# Patient Record
Sex: Male | Born: 1945
Health system: Southern US, Community
[De-identification: ages and names within clinical notes are randomized; demographics above are authoritative.]

## PROBLEM LIST (undated history)

## (undated) DIAGNOSIS — N400 Enlarged prostate without lower urinary tract symptoms: Secondary | ICD-10-CM

## (undated) DIAGNOSIS — K8010 Calculus of gallbladder with chronic cholecystitis without obstruction: Secondary | ICD-10-CM

## (undated) DIAGNOSIS — R569 Unspecified convulsions: Secondary | ICD-10-CM

## (undated) DIAGNOSIS — Q282 Arteriovenous malformation of cerebral vessels: Secondary | ICD-10-CM

## (undated) DIAGNOSIS — N419 Inflammatory disease of prostate, unspecified: Secondary | ICD-10-CM

## (undated) DIAGNOSIS — M1990 Unspecified osteoarthritis, unspecified site: Secondary | ICD-10-CM

## (undated) DIAGNOSIS — N481 Balanitis: Secondary | ICD-10-CM

## (undated) DIAGNOSIS — E785 Hyperlipidemia, unspecified: Secondary | ICD-10-CM

## (undated) DIAGNOSIS — G4731 Primary central sleep apnea: Secondary | ICD-10-CM

## (undated) DIAGNOSIS — G4730 Sleep apnea, unspecified: Secondary | ICD-10-CM

## (undated) DIAGNOSIS — K219 Gastro-esophageal reflux disease without esophagitis: Secondary | ICD-10-CM

## (undated) DIAGNOSIS — Z9289 Personal history of other medical treatment: Secondary | ICD-10-CM

## (undated) DIAGNOSIS — R001 Bradycardia, unspecified: Secondary | ICD-10-CM

## (undated) DIAGNOSIS — T7840XA Allergy, unspecified, initial encounter: Secondary | ICD-10-CM

## (undated) DIAGNOSIS — J45909 Unspecified asthma, uncomplicated: Secondary | ICD-10-CM

## (undated) HISTORY — DX: Unspecified convulsions: R56.9

## (undated) HISTORY — DX: Inflammatory disease of prostate, unspecified: N41.9

## (undated) HISTORY — DX: Personal history of other medical treatment: Z92.89

## (undated) HISTORY — DX: Primary central sleep apnea: G47.31

## (undated) HISTORY — DX: Unspecified osteoarthritis, unspecified site: M19.90

## (undated) HISTORY — DX: Benign prostatic hyperplasia without lower urinary tract symptoms: N40.0

## (undated) HISTORY — DX: Balanitis: N48.1

## (undated) HISTORY — DX: Bradycardia, unspecified: R00.1

## (undated) HISTORY — PX: FRACTURE SURGERY: SHX138

## (undated) HISTORY — DX: Calculus of gallbladder with chronic cholecystitis without obstruction: K80.10

## (undated) HISTORY — DX: Unspecified asthma, uncomplicated: J45.909

## (undated) HISTORY — PX: TONSILLECTOMY: SUR1361

## (undated) HISTORY — DX: Arteriovenous malformation of cerebral vessels: Q28.2

## (undated) HISTORY — DX: Allergy, unspecified, initial encounter: T78.40XA

## (undated) HISTORY — DX: Hyperlipidemia, unspecified: E78.5

## (undated) HISTORY — DX: Gastro-esophageal reflux disease without esophagitis: K21.9

## (undated) HISTORY — DX: Sleep apnea, unspecified: G47.30

---

## 2000-11-11 HISTORY — PX: ELBOW SURGERY: SHX618

## 2009-02-27 ENCOUNTER — Emergency Department (HOSPITAL_COMMUNITY): Admission: EM | Admit: 2009-02-27 | Discharge: 2009-02-27 | Payer: Self-pay | Admitting: Family Medicine

## 2012-03-29 ENCOUNTER — Other Ambulatory Visit: Payer: Self-pay | Admitting: Physician Assistant

## 2012-03-29 DIAGNOSIS — R109 Unspecified abdominal pain: Secondary | ICD-10-CM

## 2012-03-30 ENCOUNTER — Ambulatory Visit
Admission: RE | Admit: 2012-03-30 | Discharge: 2012-03-30 | Disposition: A | Discharge status: Home / Self Care | Payer: BC Managed Care – PPO | Source: Ambulatory Visit | Attending: Physician Assistant | Admitting: Physician Assistant

## 2012-03-30 DIAGNOSIS — R109 Unspecified abdominal pain: Secondary | ICD-10-CM

## 2012-04-06 ENCOUNTER — Encounter (INDEPENDENT_AMBULATORY_CARE_PROVIDER_SITE_OTHER): Payer: Self-pay | Admitting: General Surgery

## 2012-04-18 ENCOUNTER — Ambulatory Visit (INDEPENDENT_AMBULATORY_CARE_PROVIDER_SITE_OTHER): Payer: BC Managed Care – PPO | Admitting: General Surgery

## 2012-04-18 ENCOUNTER — Encounter (INDEPENDENT_AMBULATORY_CARE_PROVIDER_SITE_OTHER): Payer: Self-pay | Admitting: General Surgery

## 2012-04-18 VITALS — BP 110/70 | HR 60 | Temp 97.7°F | Resp 12 | Ht 69.5 in | Wt 156.0 lb

## 2012-04-18 DIAGNOSIS — K8020 Calculus of gallbladder without cholecystitis without obstruction: Secondary | ICD-10-CM

## 2012-04-18 NOTE — Progress Notes (Signed)
Subjective:     Patient ID: Anthony Juarez, male   DOB: 04-Oct-1945, 66 y.o.   MRN: [id number]  HPI This patient is a 66 year old male with some minimal right upper quadrant pain. He states this started in 2007 with some minimal right upper quadrant pain as well as nausea. Patient was noted with a HIDA scan ultrasound EGD which revealed no GERD abnormal HIDA scan at that time. Patient at this time has complained more of "brain fog" in increased enuresis.  The the patient states he has no symptomatology to greasy foods for spicy foods at this time.  Review of Systems  Constitutional: Negative.   HENT: Negative.   Eyes: Negative.   Respiratory: Negative.   Cardiovascular: Negative.   Gastrointestinal: Positive for nausea.  Genitourinary: Positive for enuresis.  Neurological: Negative.        Objective:   Physical Exam  Constitutional: He is oriented to person, place, and time. He appears well-developed and well-nourished.  HENT:  Head: Normocephalic and atraumatic.  Eyes: Conjunctivae normal and EOM are normal. Pupils are equal, round, and reactive to light.  Neck: Neck supple.  Cardiovascular: Normal rate.   Pulmonary/Chest: Effort normal and breath sounds normal.  Abdominal: Soft. Bowel sounds are normal. He exhibits no distension and no mass. There is no tenderness. There is no rebound and no guarding.  Musculoskeletal: Normal range of motion.  Neurological: He is alert and oriented to person, place, and time.       Assessment:     Patient is 66 year old male with noted gallstones on ultrasound. The patient however would like to hold off on possible surgery to remove his gallbladder and gallstones secondary to his urinary complaints therapy workup at this time.  Be available should the patient's symptoms increase her more frequent for excision of his gallbladder.  The patient requested that time should his symptoms the remaining unclear    Plan:     1. F/u PRN

## 2012-05-06 ENCOUNTER — Encounter (INDEPENDENT_AMBULATORY_CARE_PROVIDER_SITE_OTHER): Payer: Self-pay

## 2012-06-06 ENCOUNTER — Institutional Professional Consult (permissible substitution): Payer: BC Managed Care – PPO | Admitting: Pulmonary Disease

## 2012-06-06 DIAGNOSIS — R1013 Epigastric pain: Secondary | ICD-10-CM | POA: Insufficient documentation

## 2012-06-06 DIAGNOSIS — R110 Nausea: Secondary | ICD-10-CM | POA: Insufficient documentation

## 2012-06-06 DIAGNOSIS — K8020 Calculus of gallbladder without cholecystitis without obstruction: Secondary | ICD-10-CM | POA: Insufficient documentation

## 2012-06-08 ENCOUNTER — Encounter: Payer: Self-pay | Admitting: Pulmonary Disease

## 2012-06-08 ENCOUNTER — Encounter: Payer: Self-pay | Admitting: *Deleted

## 2012-06-10 ENCOUNTER — Encounter: Payer: Self-pay | Admitting: Pulmonary Disease

## 2012-06-10 ENCOUNTER — Ambulatory Visit (INDEPENDENT_AMBULATORY_CARE_PROVIDER_SITE_OTHER): Payer: BC Managed Care – PPO | Admitting: Pulmonary Disease

## 2012-06-10 VITALS — BP 130/70 | HR 60 | Temp 98.1°F | Ht 69.0 in | Wt 163.6 lb

## 2012-06-10 DIAGNOSIS — G478 Other sleep disorders: Secondary | ICD-10-CM

## 2012-06-10 DIAGNOSIS — G4731 Primary central sleep apnea: Secondary | ICD-10-CM | POA: Insufficient documentation

## 2012-06-10 NOTE — Patient Instructions (Addendum)
Will schedule for a sleep study, and will arrange followup once the results are available.  

## 2012-06-10 NOTE — Assessment & Plan Note (Signed)
The patient is having issues with awakenings at night, as well as polyuria, and is not rested in the mornings upon arising.  He is not describing true sleepiness, but does note chronic "fogginess".  It is unclear if he has a sleep disorder to explain his symptoms, but he was diagnosed with obstructive sleep apnea in 1994 and could not tolerate a positive pressure device at that time.  I think he needs a sleep study for evaluation, and this can pick up central sleep apnea and other neurologic issues as well.  I will see him back once his sleep study is available for review.

## 2012-06-10 NOTE — Progress Notes (Signed)
Subjective:    Patient ID: Anthony Juarez, male    DOB: May 27, 1946, 66 y.o.   MRN: [id number]  HPI The patient is a 66 year old male who I have been asked to see for possible sleep apnea.  He was diagnosed in 62 while living in [place] with sleep apnea, but was unable to tolerate CPAP or BiPAP because of mask issues.  He is having issues with nocturnal polyuria, and questions been raised whether it may be related to a sleep disorder.  He is seeing a neurologist who is trying him on desmopressin.  The patient has been noted to have loud snoring, as well as an abnormal breathing pattern during sleep.  He awakens at least 2-3 times a night, and is typically not rested in the mornings upon arising.  He denies any true sleepiness during the day, but complains of chronic "fogginess".  He has no issues in the evenings watching television or movies except on occasions.  He has no sleepiness with driving.  He does have a history of nocturnal seizures in the past, but this has not been an issue for him recently.  The patient states his weight is down 14 pounds over the last 1-2 years, and his Epworth score today is 6.  Sleep Questionnaire: What time do you typically go to bed?( Between what hours) 11-11:30 pm How long does it take you to fall asleep? 5 minutes How many times during the night do you wake up? 3 What time do you get out of bed to start your day? 0730 Do you drive or operate heavy machinery in your occupation? No How much has your weight changed (up or down) over the past two years? (In pounds) 4 lb (1.814 kg) Have you ever had a sleep study before? Yes If yes, location of study? [place] If yes, date of study? 1994 Do you currently use CPAP? No Do you wear oxygen at any time? No    Review of Systems  Constitutional: Positive for unexpected weight change. Negative for fever.  HENT: Positive for congestion and postnasal drip. Negative for ear pain, nosebleeds, sore throat, rhinorrhea, sneezing,  trouble swallowing, dental problem and sinus pressure.   Eyes: Negative for redness and itching.  Respiratory: Positive for shortness of breath and wheezing. Negative for cough and chest tightness.   Cardiovascular: Negative for palpitations and leg swelling.  Gastrointestinal: Positive for nausea. Negative for vomiting.  Genitourinary: Negative for dysuria.  Musculoskeletal: Negative for joint swelling.  Skin: Positive for rash.  Neurological: Negative for headaches.  Hematological: Does not bruise/bleed easily.  Psychiatric/Behavioral: Negative for dysphoric mood. The patient is nervous/anxious.        Objective:   Physical Exam Constitutional:  Thin male in nad, no acute distress  HENT:  Nares patent without discharge, but narrowed.  Oropharynx without exudate, palate and uvula are mildly elongated.   Eyes:  Perrla, eomi, no scleral icterus  Neck:  No JVD, no TMG  Cardiovascular:  Normal rate, regular rhythm, no rubs or gallops.  No murmurs        Intact distal pulses  Pulmonary :  Normal breath sounds, no stridor or respiratory distress   No rales, rhonchi, or wheezing  Abdominal:  Soft, nondistended, bowel sounds present.  No tenderness noted.   Musculoskeletal:  No lower extremity edema noted.  Lymph Nodes:  No cervical lymphadenopathy noted  Skin:  No cyanosis noted  Neurologic:  Alert, appropriate, moves all 4 extremities without obvious deficit.  Assessment & Plan:

## 2012-06-17 ENCOUNTER — Ambulatory Visit (HOSPITAL_BASED_OUTPATIENT_CLINIC_OR_DEPARTMENT_OTHER): Payer: BC Managed Care – PPO | Attending: Pulmonary Disease | Admitting: Radiology

## 2012-06-17 VITALS — Ht 69.0 in | Wt 160.0 lb

## 2012-06-17 DIAGNOSIS — G4730 Sleep apnea, unspecified: Secondary | ICD-10-CM

## 2012-06-17 DIAGNOSIS — G478 Other sleep disorders: Secondary | ICD-10-CM

## 2012-06-17 DIAGNOSIS — G4731 Primary central sleep apnea: Secondary | ICD-10-CM | POA: Insufficient documentation

## 2012-06-28 ENCOUNTER — Telehealth: Payer: Self-pay | Admitting: Pulmonary Disease

## 2012-06-28 NOTE — Telephone Encounter (Signed)
Pt informed that results are not back yet and we will call when results are back. Dr Shelle Iron, has this study been read yet?

## 2012-06-28 NOTE — Telephone Encounter (Signed)
Probably not.  I am reading this week.

## 2012-06-28 NOTE — Telephone Encounter (Signed)
Pt is aware we will call to schedule him for follow-up once study has been read. Pt verbalized understanding.

## 2012-06-29 ENCOUNTER — Telehealth: Payer: Self-pay | Admitting: Pulmonary Disease

## 2012-06-29 DIAGNOSIS — G4710 Hypersomnia, unspecified: Secondary | ICD-10-CM

## 2012-06-29 DIAGNOSIS — G4730 Sleep apnea, unspecified: Secondary | ICD-10-CM

## 2012-06-29 NOTE — Procedures (Signed)
NAMEOKLEY, MAGNUSSEN            ACCOUNT NO.:  [id number]  MEDICAL RECORD NO.:  [id number]          PATIENT TYPE:  OUT  LOCATION:  SLEEP CENTER                 FACILITY:  [facility]  PHYSICIAN:  Barbaraann Share, MD,FCCPDATE OF BIRTH:  Jun 14, 1946  DATE OF STUDY:  06/17/2012                           NOCTURNAL POLYSOMNOGRAM  REFERRING PHYSICIAN:  Barbaraann Share, MD,FCCP  LOCATION:  Sleep Lab.  REFERRING PHYSICIAN:  Barbaraann Share, MD,FCCP  INDICATION FOR STUDY:  Hypersomnia with sleep apnea.  EPWORTH SLEEPINESS SCORE:  6.  SLEEP ARCHITECTURE:  The patient had total sleep time of 311 minutes with no slow-wave sleep and only 49 minutes of REM.  Sleep onset latency was mildly prolonged at 34 minutes, and REM onset was normal at 58 minutes.  Sleep efficiency was moderately reduced at 78%.  RESPIRATORY DATA:  The patient was found to have 9 obstructive apneas, 73 central apneas, and 1 obstructive hypopnea, giving the patient an apnea/hypopnea index of 16 events per hour.  The events were not positional, and there was mild to moderate snoring noted throughout.  OXYGEN DATA:  There was O2 desaturation as low as 91% with the patient's obstructive events.  CARDIAC DATA:  Occasional PAC and PVC noted, but no clinically significant arrhythmias were seen.  MOVEMENT/PARASOMNIA:  The patient had no significant leg jerks or other abnormal behaviors.  IMPRESSION/RECOMMENDATION: 1. Mild central sleep apnea, with an AHI of 16 events per hour and     oxygen desaturation as low as 91%.  Treatment for this degree of     sleep apnea can include nasal oxygen, standard CPAP trial, or an     Adapt ASV device.  This may not even require treatment depending     upon the patient's quality of life impact.  Clinical correlation is     suggested 2. Occasional premature atrial contraction and premature ventricular     contraction noted, but no clinically significant arrhythmias were      seen.     Barbaraann Share, MD,FCCP Diplomate, American Board of Sleep Medicine    [facility]/MEDQ  D:  06/29/2012 07:55:59  T:  06/29/2012 21:01:00  Job:  [id number]

## 2012-06-29 NOTE — Telephone Encounter (Signed)
LMTCB x 1 

## 2012-06-29 NOTE — Telephone Encounter (Signed)
Needs OV with KC to discuss sleep results.

## 2012-07-01 NOTE — Telephone Encounter (Signed)
Pt has been scheduled for Tues., 07/19/12 # 2:45 to review s;leep study results.

## 2012-07-04 ENCOUNTER — Encounter (HOSPITAL_BASED_OUTPATIENT_CLINIC_OR_DEPARTMENT_OTHER): Payer: BC Managed Care – PPO

## 2012-07-11 ENCOUNTER — Telehealth: Payer: Self-pay | Admitting: Pulmonary Disease

## 2012-07-11 NOTE — Telephone Encounter (Signed)
LMOM TCB x1.    Sleep study is scanned (study date 12.6.13); KC not in office until 1.2.14.  Left detailed message on named voice mail informing pt of this and asked him to please call back if anything is needed prior to hearing back from [place].  KC please advise.

## 2012-07-12 NOTE — Telephone Encounter (Signed)
LMTCB x 1. Pt is scheduled for OV with KC on 07/19/12 @ 2:45 to discuss sleep results. Need to clarify this with the pt and make sure he can keep this appt.

## 2012-07-14 NOTE — Telephone Encounter (Signed)
Appt rescheduled to Monday 07-26-11 at 10:15. Carron Curie, CMA

## 2012-07-19 ENCOUNTER — Ambulatory Visit: Payer: BC Managed Care – PPO | Admitting: Pulmonary Disease

## 2012-07-25 ENCOUNTER — Encounter: Payer: Self-pay | Admitting: Pulmonary Disease

## 2012-07-25 ENCOUNTER — Ambulatory Visit (INDEPENDENT_AMBULATORY_CARE_PROVIDER_SITE_OTHER): Payer: BC Managed Care – PPO | Admitting: Pulmonary Disease

## 2012-07-25 VITALS — BP 104/68 | HR 62 | Temp 97.8°F | Ht 69.5 in | Wt 162.4 lb

## 2012-07-25 DIAGNOSIS — G4730 Sleep apnea, unspecified: Secondary | ICD-10-CM

## 2012-07-25 DIAGNOSIS — G4731 Primary central sleep apnea: Secondary | ICD-10-CM

## 2012-07-25 NOTE — Assessment & Plan Note (Signed)
The patient has been found to have mild central sleep apnea, but it is unclear whether this has anything to do with his sleep disruption and daytime fogginess.  He was found to have an elevated Dilantin level, and subsequently has been changed to Keppra for his seizure disorder.  I have explained to the patient that his degree of sleep apnea it is not a medical issue for him, and the decision to treat this aggressively should be based on its impact to his quality of life.  The patient at this time would like to see how he feels since he has changed seizure medications.  He will let me know if he wishes to treat his central sleep apnea more aggressively.  At that time, I would consider trying him on an auto ASV.

## 2012-07-25 NOTE — Patient Instructions (Addendum)
Will hold off on treatment of your central sleep apnea until we see how you do on your new seizure medications. Please let me know if your symptoms continue, and we can consider auto ASV machine.  Just let me know.

## 2012-07-25 NOTE — Progress Notes (Signed)
  Subjective:    Patient ID: Anthony Juarez, male    DOB: 1945/12/12, 67 y.o.   MRN: [id number]  HPI Patient comes in today following his recent sleep study.  He was found to have mild central sleep apnea, with an AHI of 16 events per hour and oxygen desaturation only to 91%.  He did not have any nocturnal seizures or significant cardiac events.  I have reviewed the study with him in detail, and answered all of his questions.  In the interim, he has seen a neurologist who found that his Dilantin level was supratherapeutic.  He has subsequently been changed to Keppra, and he is waiting on establishing a baseline.   Review of Systems  Constitutional: Negative for fever and unexpected weight change.  HENT: Positive for congestion. Negative for ear pain, nosebleeds, sore throat, rhinorrhea, sneezing, trouble swallowing, dental problem, postnasal drip and sinus pressure.   Eyes: Negative for redness and itching.  Respiratory: Negative for cough, chest tightness, shortness of breath and wheezing.   Cardiovascular: Negative for chest pain, palpitations and leg swelling.  Gastrointestinal: Negative for nausea and vomiting.  Genitourinary: Positive for frequency. Negative for dysuria. Flank pain:  nocturia---2-3x nightly.  Musculoskeletal: Negative for joint swelling.       Pain in hips--arthritis  Skin: Negative for rash.  Neurological: Negative for headaches.  Hematological: Does not bruise/bleed easily.  Psychiatric/Behavioral: Negative for dysphoric mood. The patient is not nervous/anxious.        Objective:   Physical Exam Thin male in no acute distress Nose without purulence or discharge noted Neck without lymphadenopathy or thyromegaly Lower extremities without edema, cyanosis Alert and oriented, moves all 4 extremities.       Assessment & Plan:

## 2013-01-19 ENCOUNTER — Ambulatory Visit (INDEPENDENT_AMBULATORY_CARE_PROVIDER_SITE_OTHER): Payer: BC Managed Care – PPO | Admitting: Nurse Practitioner

## 2013-01-19 ENCOUNTER — Encounter: Payer: Self-pay | Admitting: Nurse Practitioner

## 2013-01-19 VITALS — BP 114/71 | HR 62 | Ht 69.5 in | Wt 176.0 lb

## 2013-01-19 DIAGNOSIS — G40909 Epilepsy, unspecified, not intractable, without status epilepticus: Secondary | ICD-10-CM

## 2013-01-19 NOTE — Patient Instructions (Addendum)
Pt will continue Keppra 750 XR  Will obtain MRI of the brain in January 2015 F/U in 6 months

## 2013-01-19 NOTE — Progress Notes (Signed)
HPI: Mr. Mcbryar is a 67 yo WM, following up for seizure disorder and  brain fog   He has PMHx of  seizure since infant, he was treated with dilantin and phenobarbital for a long time, was eventurally tapered off after seizure free since age 69, he had one recurrent seizure at age 3, was put back on dilantin 400mg  qhs, has been on it since. He had Bachelor's degree, retired as Building services engineer, Animal nutritionist  in 2009, moved to [place] about 2.5 years ago, now he stays at home most of time, rarely initiated activities, felt fatigue, difficulty concontrating, getting worse since summer of 2013.  His wife complains that he is not up to his house projects anymore, frequent nocturia, which has prompted recent treatment with desmopression, which helped his frequent night time awaking,  He also has sleep apnea, but could not tolerate his BiPAP machine.He is seen by Dr. Shelle Iron.  Dilantin level was 32 while he was taking Dilantin 100 mg 4 tablets each night, level decreased to 22 while he was taking Dilantin 100 mg 3 tablets each night, he has developed gingival hypertrophy, his brain fogginess has much improved with decreased dose of Dilantin, especially after he stopped taking Desopressin, MRI scan of the brain showed a cavernous angioma with remote age hemorrhage in the right frontal subcortical region with  and adjacent  venous angioma.  There are moderate changes of chronic microvascular ischemia and mild degree of generalized cerebral atrophy. Followup visit today patient has been switched to Keppra XR 750mg  daily. He is off Dilantin totally and his brain fogginess has improved. He has not had further seizure activity. He has questions about his previous MRI of the brain. He has no new complaints    ROS:  Fatigue, ringing in the ears at times, joint pain, easy bruising, sleepiness Physical Exam General: well developed, well nourished, seated, in no evident distress Head: head normocephalic  and atraumatic. Oropharynx benign Neck: supple with no carotid  bruits Cardiovascular: regular rate and rhythm, no murmurs  Neurologic Exam Mental Status: Awake and fully alert. Oriented to place and time. Follows all commands. Speech and language normal.  MMSE deferred Cranial Nerves: Fundoscopic exam reveals sharp disc margins. Pupils equal, briskly reactive to light. Extraocular movements full without nystagmus. Visual fields full to confrontation. Hearing intact and symmetric to finger snap. Facial sensation intact. Face, tongue, palate move normally and symmetrically. Neck flexion and extension normal.  Motor: Normal bulk and tone. Normal strength in all tested extremity muscles.No focal weakness Sensory.: intact to touch and pinprick and vibratory.  Coordination: Rapid alternating movements normal in all extremities. Finger-to-nose and heel-to-shin performed accurately bilaterally. No dysmetria Gait and Station: Arises from chair without difficulty. Stance is normal. Gait demonstrates normal stride length and balance . Able to heel, toe and tandem walk without difficulty.  Reflexes: 2+ and symmetric. Toes downgoing.     ASSESSMENT: History of seizure disorder currently stable on Keppra Right frontal cavernous angioma on MRI of the brain, with chronic microvascular ischemia and mild degree of generalized cerebral atrophy     PLAN: Pt will continue Keppra 750 XR  Will obtain MRI of the brain in January 2015 Reviewed most recent MRI with patient and given copy of report.  Will get MMSE at next visit F/U in 6 months Nilda Riggs, [facility] APRN

## 2013-04-10 ENCOUNTER — Emergency Department (HOSPITAL_COMMUNITY)
Admission: EM | Admit: 2013-04-10 | Discharge: 2013-04-10 | Disposition: A | Discharge status: Home / Self Care | Payer: Medicare Other | Attending: Emergency Medicine | Admitting: Emergency Medicine

## 2013-04-10 ENCOUNTER — Encounter (HOSPITAL_COMMUNITY): Payer: Self-pay

## 2013-04-10 DIAGNOSIS — Z7982 Long term (current) use of aspirin: Secondary | ICD-10-CM | POA: Insufficient documentation

## 2013-04-10 DIAGNOSIS — Z880 Allergy status to penicillin: Secondary | ICD-10-CM | POA: Insufficient documentation

## 2013-04-10 DIAGNOSIS — G4733 Obstructive sleep apnea (adult) (pediatric): Secondary | ICD-10-CM | POA: Insufficient documentation

## 2013-04-10 DIAGNOSIS — H43399 Other vitreous opacities, unspecified eye: Secondary | ICD-10-CM | POA: Insufficient documentation

## 2013-04-10 DIAGNOSIS — Z87891 Personal history of nicotine dependence: Secondary | ICD-10-CM | POA: Insufficient documentation

## 2013-04-10 DIAGNOSIS — E785 Hyperlipidemia, unspecified: Secondary | ICD-10-CM | POA: Insufficient documentation

## 2013-04-10 DIAGNOSIS — H539 Unspecified visual disturbance: Secondary | ICD-10-CM

## 2013-04-10 DIAGNOSIS — N400 Enlarged prostate without lower urinary tract symptoms: Secondary | ICD-10-CM | POA: Insufficient documentation

## 2013-04-10 DIAGNOSIS — G40909 Epilepsy, unspecified, not intractable, without status epilepticus: Secondary | ICD-10-CM | POA: Insufficient documentation

## 2013-04-10 DIAGNOSIS — Z79899 Other long term (current) drug therapy: Secondary | ICD-10-CM | POA: Insufficient documentation

## 2013-04-10 DIAGNOSIS — H53439 Sector or arcuate defects, unspecified eye: Secondary | ICD-10-CM | POA: Insufficient documentation

## 2013-04-10 MED ORDER — TETRACAINE HCL 0.5 % OP SOLN
2.0000 [drp] | Freq: Once | OPHTHALMIC | Status: AC
  Administered 2013-04-10: 2 [drp] via OPHTHALMIC
  Filled 2013-04-10: qty 2

## 2013-04-10 NOTE — ED Provider Notes (Signed)
CSN: [id number]     Arrival date & time 04/10/13  1156 History   First MD Initiated Contact with Patient 04/10/13 1504     Chief Complaint  Patient presents with  . vision changes    (Consider location/radiation/quality/duration/timing/severity/associated sxs/prior Treatment) HPI Comments: Patient states some bright flashing lights in his peripheral vision in the L eye. Began 2 days ago, only when at night. Not present in bright light. Did not happen with sudden exposure to darkness or bright lights. Denies eye pain, headache, vomiting, nausea. No blurry vision, no darker vision, no sudden decrease in vision.   Patient is a 67 y.o. male presenting with eye problem. The history is provided by the patient.  Eye Problem Location:  L eye Quality:  Unable to specify Severity:  Moderate Onset quality:  Sudden Duration:  2 days Timing:  Intermittent Progression:  Unchanged Chronicity:  New Context: not burn, not chemical exposure, not contact lens problem, not direct trauma, not foreign body, not using machinery, not scratch, not smoke exposure and not tanning booth use   Relieved by:  Nothing Worsened by:  Nothing tried Ineffective treatments:  None tried Associated symptoms: no blurred vision, no discharge, no double vision, no foreign body sensation, no inflammation, no itching, no nausea, no photophobia, no redness, no scotomas, no swelling, no tearing, no tingling and no vomiting   Risk factors: no conjunctival hemorrhage, not exposed to pinkeye, no previous injury to eye, no recent herpes zoster and no recent URI     Past Medical History  Diagnosis Date  . Asthma     as a child  . Hyperlipidemia   . Seizures   . Prostatitis   . Balanitis   . Benign prostatic hypertrophy   . Cholecystitis with cholelithiasis   . Convulsive disorder   . Hyperlipidemia   . OSA (obstructive sleep apnea)    Past Surgical History  Procedure Laterality Date  . Elbow surgery  11/11/2000    repair   . Tonsillectomy     Family History  Problem Relation Age of Onset  . Heart disease Father    History  Substance Use Topics  . Smoking status: Former Smoker -- 1.00 packs/day for 10 years    Quit date: 07/13/1977  . Smokeless tobacco: Never Used  . Alcohol Use: Yes    Review of Systems  Eyes: Negative for blurred vision, double vision, photophobia, discharge, redness and itching.  Gastrointestinal: Negative for nausea and vomiting.  Neurological: Negative for tingling.  All other systems reviewed and are negative.    Allergies  Penicillins  Home Medications   Current Outpatient Rx  Name  Route  Sig  Dispense  Refill  . aspirin 81 MG tablet   Oral   Take 81 mg by mouth daily.          . Digestive Enzymes (DIGESTIVE SUPPORT PO)   Oral   Take 1 tablet by mouth daily as needed. constipation         . Levetiracetam (KEPPRA XR) 750 MG TB24   Oral   Take 750 mg by mouth daily.         . Melatonin 1 MG/4ML LIQD   Oral   Take 4 mLs by mouth at bedtime.         . Multiple Vitamins-Minerals (MULTIVITAMIN WITH MINERALS) tablet   Oral   Take 1 tablet by mouth daily.         . psyllium (METAMUCIL SMOOTH TEXTURE) 28 % packet  Oral   Take 1 packet by mouth daily.         . simvastatin (ZOCOR) 80 MG tablet   Oral   Take 40 mg by mouth at bedtime.          BP 133/83  Pulse 67  Temp(Src) 97.8 F (36.6 C) (Oral)  Resp 18  Ht 5\' 9"  (1.753 m)  Wt 173 lb (78.472 kg)  BMI 25.54 kg/m2  SpO2 98% Physical Exam  Nursing note and vitals reviewed. Constitutional: He is oriented to person, place, and time. He appears well-developed and well-nourished. No distress.  HENT:  Head: Normocephalic and atraumatic.  Mouth/Throat: No oropharyngeal exudate.  Eyes: EOM are normal. Pupils are equal, round, and reactive to light. Right eye exhibits no discharge. Left eye exhibits no discharge.  Neck: Normal range of motion. Neck supple.  Cardiovascular: Normal rate and  regular rhythm.  Exam reveals no friction rub.   No murmur heard. Pulmonary/Chest: Effort normal and breath sounds normal. No respiratory distress. He has no wheezes. He has no rales.  Abdominal: He exhibits no distension. There is no tenderness. There is no rebound.  Musculoskeletal: Normal range of motion. He exhibits no edema.  Neurological: He is alert and oriented to person, place, and time.  Skin: He is not diaphoretic.    ED Course  Procedures (including critical care time) Labs Review Labs Reviewed - No data to display Imaging Review No results found.  MDM   1. Vision changes    71M   with no prior eye history presents with floaters and hairless from lights. This began gradually on Saturday evening. This did not happen suddenly with exposure to darkness or bright light.  he denies any eye trauma. He denies any massive increase in his floaters. The flashes of light or intermittent and are not happening here when I made the room dark. Here vitals are stable. He has normal extraocular movements. He has normal intraocular pressure of the left eye at 18, 21 in the right eye. He has normal pupil reactivity. On bedside ultrasound, no large retinal detachment seen. No massive vitreous hemorrhage seen. Occasional haziness in the posterior high, likely artifact. It does not appears similar to a large posterior vitreous hemorrhage. Patient denies any vomiting, nausea. I do not feel he patient's glaucoma or retinal detachment. Patient to have a possible vitreous hemorrhage. I talked to Dr. Delaney Meigs who will see patient tomorrow in clinic. Patient given instructions on how to get the clinic tomorrow in a stable for discharge. Visual acuity 20/25 OD, 20/30 in either eye with corrective lenses.  Dagmar Hait, MD 04/10/13 [phone]

## 2013-04-10 NOTE — ED Notes (Signed)
Patient wore glasses for visual screening- as he wears them all the time

## 2013-04-10 NOTE — ED Notes (Signed)
Pt c/o L eye vision changes starting Friday night.  Sts "I was having a bright flashing, like shooting stars, in peripheral vision and a floater."  Denies pain.  Denies blurred vision.

## 2013-05-18 ENCOUNTER — Other Ambulatory Visit: Payer: Self-pay

## 2013-06-15 ENCOUNTER — Telehealth: Payer: Self-pay | Admitting: Neurology

## 2013-06-15 DIAGNOSIS — Q282 Arteriovenous malformation of cerebral vessels: Secondary | ICD-10-CM

## 2013-06-15 DIAGNOSIS — G40909 Epilepsy, unspecified, not intractable, without status epilepticus: Secondary | ICD-10-CM

## 2013-06-15 HISTORY — DX: Arteriovenous malformation of cerebral vessels: Q28.2

## 2013-06-15 NOTE — Telephone Encounter (Signed)
Please let patient know, Yes, I have entered the order for MRI brain w/wo contrast before his follow up appt with Eber Jones in Feb 2015.

## 2013-06-15 NOTE — Telephone Encounter (Signed)
Patient has rescheduled appt and would like to know if he should schedule a MRI

## 2013-06-16 NOTE — Telephone Encounter (Signed)
Patient is changing to a new insurance company within a week, will call with information

## 2013-07-17 ENCOUNTER — Ambulatory Visit: Payer: BC Managed Care – PPO | Admitting: Nurse Practitioner

## 2013-07-20 ENCOUNTER — Ambulatory Visit
Admission: RE | Admit: 2013-07-20 | Discharge: 2013-07-20 | Disposition: A | Discharge status: Home / Self Care | Payer: Medicare HMO | Source: Ambulatory Visit | Attending: Neurology | Admitting: Neurology

## 2013-07-20 DIAGNOSIS — G40909 Epilepsy, unspecified, not intractable, without status epilepticus: Secondary | ICD-10-CM

## 2013-07-20 DIAGNOSIS — Q282 Arteriovenous malformation of cerebral vessels: Secondary | ICD-10-CM

## 2013-07-20 MED ORDER — GADOBENATE DIMEGLUMINE 529 MG/ML IV SOLN
15.0000 mL | Freq: Once | INTRAVENOUS | Status: AC | PRN
  Administered 2013-07-20: 15 mL via INTRAVENOUS

## 2013-07-24 ENCOUNTER — Telehealth: Payer: Self-pay | Admitting: Neurology

## 2013-07-24 NOTE — Telephone Encounter (Signed)
Please call patient, MRI brain showed   There is a right frontal juxtacortical T2 heterogenous lesion (1.4x1.0cm), with "popcorn" appearance, consistent with cavernous malformation. There is an associated small developmental venous anomaly.   Multiple round and ovoid, periventricular, subcortical and juxtacortical T2 hyperintense foci. These findings are non-specific and considerations include autoimmune, inflammatory, post-infectious, microvascular ischemic or migraine associated etiologies.    Butch Penny, please change his followup appt with me instead of Hoyle Sauer, also ask patient if he has previous MRI scan, if he does, he should bring MRI film on his followup visit

## 2013-07-27 NOTE — Telephone Encounter (Signed)
Spoke to patient and relayed MRI results, and have made an appointment for 07-31-12 to further discuss results, per Dr. Krista Blue.

## 2013-07-31 ENCOUNTER — Encounter: Payer: Self-pay | Admitting: Neurology

## 2013-07-31 ENCOUNTER — Ambulatory Visit (INDEPENDENT_AMBULATORY_CARE_PROVIDER_SITE_OTHER): Payer: Medicare HMO | Admitting: Neurology

## 2013-07-31 VITALS — BP 131/83 | HR 57 | Ht 69.0 in | Wt 186.0 lb

## 2013-07-31 DIAGNOSIS — G4731 Primary central sleep apnea: Secondary | ICD-10-CM

## 2013-07-31 DIAGNOSIS — Q282 Arteriovenous malformation of cerebral vessels: Secondary | ICD-10-CM

## 2013-07-31 DIAGNOSIS — G40909 Epilepsy, unspecified, not intractable, without status epilepticus: Secondary | ICD-10-CM

## 2013-07-31 DIAGNOSIS — Q283 Other malformations of cerebral vessels: Secondary | ICD-10-CM

## 2013-07-31 DIAGNOSIS — G4730 Sleep apnea, unspecified: Secondary | ICD-10-CM

## 2013-07-31 MED ORDER — LEVETIRACETAM ER 750 MG PO TB24: 750.0000 mg | ORAL_TABLET | Freq: Every day | ORAL | Status: DC

## 2013-07-31 NOTE — Progress Notes (Signed)
HPI:   Anthony Juarez is a 68 yo WM, following up for seizure disorder and  brain fog   He has PMHx of seizure since infant, he was treated with dilantin and phenobarbital for a long time, was eventurally tapered off after seizure free since age 40, he had one recurrent seizure at age 53, was put back on dilantin 400mg  qhs, has been on it since.  He had Bachelor's degree, retired as Location manager, Insurance underwriter  in 2009, moved to [place] about 2.5 years ago, now he stays at home most of time, rarely initiated activities, felt fatigue, difficulty concontrating, getting worse since summer of 2013.  His wife complains that he is not up to his house projects anymore, frequent nocturia, which has prompted recent treatment with desmopression, which helped his frequent night time awaking,  He also has sleep apnea, but could not tolerate his BiPAP machine.He is seen by Dr. Gwenette Greet.  Dilantin level was 32 while he was taking Dilantin 100 mg 4 tablets each night, level decreased to 22 while he was taking Dilantin 100 mg 3 tablets each night, he has developed gingival hypertrophy, his brain fogginess has much improved with decreased dose of Dilantin, especially after he stopped taking Desopressin,   MRI scan of the brain showed a cavernous angioma with remote age hemorrhage in the right frontal subcortical region with  and adjacent  venous angioma.  There are moderate changes of chronic microvascular ischemia and mild degree of generalized cerebral atrophy.  Followup visit today patient has been switched to Keppra XR 750mg  daily. He is off Dilantin totally and his brain fogginess has improved. He has not had further seizure activity.  UPDATE Jan 19th 2015: He is now taking keppra xr 750mg  qhs, last seizure 1992, nocturnal seizure,  he only has mild fatigue, no longer having brain foggy sensation We have reviewed MRI taking January 2015, continue the right inferior frontal cavernous venous angioma,  mild atrophy, moderate small vessel disease,  ROS: Bruise, ringing in ears, joint pain,    Physical Exam General: well developed, well nourished, seated, in no evident distress Head: head normocephalic and atraumatic. Oropharynx benign Neck: supple with no carotid  bruits Cardiovascular: regular rate and rhythm, no murmurs  Neurologic Exam Mental Status: Awake and fully alert. Oriented to place and time. Follows all commands. Speech and language normal.  MMSE deferred Cranial Nerves: Fundoscopic exam reveals sharp disc margins. Pupils equal, briskly reactive to light. Extraocular movements full without nystagmus. Visual fields full to confrontation. Hearing intact and symmetric to finger snap. Facial sensation intact. Face, tongue, palate move normally and symmetrically. Neck flexion and extension normal.  Motor: Normal bulk and tone. Normal strength in all tested extremity muscles.No focal weakness Sensory.: intact to touch and pinprick and vibratory.  Coordination: Rapid alternating movements normal in all extremities. Finger-to-nose and heel-to-shin performed accurately bilaterally. No dysmetria Gait and Station: Arises from chair without difficulty. Stance is normal. Gait demonstrates normal stride length and balance . Able to heel, toe and tandem walk without difficulty.  Reflexes: 2+ and symmetric. Toes downgoing.   ASSESSMENT: History of seizure disorder currently stable on Keppra Right frontal cavernous angioma on MRI of the brain, with chronic microvascular ischemia and mild degree of generalized cerebral atrophy  PLAN: Pt will continue Keppra 750 XR  Return to clinic in one year

## 2013-08-24 ENCOUNTER — Ambulatory Visit: Payer: Self-pay | Admitting: Neurology

## 2013-08-25 ENCOUNTER — Ambulatory Visit: Payer: Self-pay | Admitting: Nurse Practitioner

## 2013-11-30 ENCOUNTER — Ambulatory Visit (INDEPENDENT_AMBULATORY_CARE_PROVIDER_SITE_OTHER): Payer: Medicare HMO | Admitting: Family Medicine

## 2013-11-30 ENCOUNTER — Ambulatory Visit: Payer: Medicare HMO

## 2013-11-30 VITALS — BP 122/84 | HR 53 | Temp 98.9°F | Resp 16 | Ht 68.5 in | Wt 179.4 lb

## 2013-11-30 DIAGNOSIS — S46819A Strain of other muscles, fascia and tendons at shoulder and upper arm level, unspecified arm, initial encounter: Secondary | ICD-10-CM

## 2013-11-30 DIAGNOSIS — Z79899 Other long term (current) drug therapy: Secondary | ICD-10-CM

## 2013-11-30 DIAGNOSIS — S46011A Strain of muscle(s) and tendon(s) of the rotator cuff of right shoulder, initial encounter: Secondary | ICD-10-CM

## 2013-11-30 DIAGNOSIS — E785 Hyperlipidemia, unspecified: Secondary | ICD-10-CM

## 2013-11-30 DIAGNOSIS — S43429A Sprain of unspecified rotator cuff capsule, initial encounter: Secondary | ICD-10-CM

## 2013-11-30 LAB — COMPREHENSIVE METABOLIC PANEL
ALBUMIN: 4.1 g/dL (ref 3.5–5.2)
ALK PHOS: 53 U/L (ref 39–117)
ALT: 13 U/L (ref 0–53)
AST: 27 U/L (ref 0–37)
BUN: 14 mg/dL (ref 6–23)
CO2: 27 meq/L (ref 19–32)
Calcium: 9.5 mg/dL (ref 8.4–10.5)
Chloride: 104 mEq/L (ref 96–112)
Creat: 1.05 mg/dL (ref 0.50–1.35)
GLUCOSE: 92 mg/dL (ref 70–99)
Potassium: 5 mEq/L (ref 3.5–5.3)
SODIUM: 140 meq/L (ref 135–145)
TOTAL PROTEIN: 6.8 g/dL (ref 6.0–8.3)
Total Bilirubin: 0.9 mg/dL (ref 0.2–1.2)

## 2013-11-30 LAB — LIPID PANEL
CHOLESTEROL: 183 mg/dL (ref 0–200)
HDL: 73 mg/dL (ref >39)
LDL Cholesterol: 98 mg/dL (ref 0–99)
TRIGLYCERIDES: 60 mg/dL (ref <150)
Total CHOL/HDL Ratio: 2.5 Ratio
VLDL: 12 mg/dL (ref 0–40)

## 2013-11-30 NOTE — Patient Instructions (Signed)
I think you have injured the supraspinatus muscle. It also appears that you have some injury and arthritis in your acromioclavicular joint.  We need to send you to physical therapy and if you aren't getting good results with that we may need to refer you to orthopedics for further treatment.  Rotator Cuff Tear The rotator cuff is four tendons that assist in the motion of the shoulder. A rotator cuff tear is a tear in one of these four tendons. It is characterized by pain and weakness of the shoulder. The rotator cuff tendons surround the shoulder ball and socket joint (humeral head). The rotator cuff tendons attach to the shoulder blade (scapula) on one side and the upper arm bone (humerus) on the other side. The rotator cuff is essential for shoulder stability and shoulder motion. SYMPTOMS   Pain around the shoulder, often at the outer portion of the upper arm.  Pain that is worse with shoulder function, especially when reaching overhead or lifting.  Weakness of the shoulder muscles.  Aching when not using your arm; often, pain awakens you at night, especially when sleeping on the affected side.  Tenderness, swelling, warmth, or redness over the outer aspect of the shoulder.  Loss of strength.  Limited motion of the shoulder, especially reaching behind (reaching into one's back pocket) or across your body.  A crackling sound (crepitation) when moving the shoulder.  Biceps tendon pain (in the front of the shoulder) and inflammation, worse with bending the elbow or lifting. CAUSES   Strain from sudden increase in amount or intensity of activity.  Direct blow or injury to the shoulder.  Aging, wear from from normal use.  Roof of the shoulder (acromial) spur. RISK INCREASES WITH:   Contact sports (football, wrestling, or boxing).  Throwing or hitting sports (baseball, tennis, or volleyball).  Weightlifting and bodybuilding.  Heavy labor.  Previous injury to rotator  cuff.  Failure to warm up properly before activity.  Inadequate protective equipment.  Increasing age.  Spurring of the outer end of the scapula (acromion).  Cortisone injections.  Poor shoulder strength and flexibility. PREVENTION  Warm up and stretch properly before activity.  Allow time for rest and recovery between practices and competition.  Maintain physical fitness:  Cardiovascular fitness.  Shoulder flexibility.  Strength and endurance of the rotator cuff muscles and muscles of the shoulder blade.  Learn and use proper technique when throwing or hitting. PROGNOSIS Surgery is often needed. Although, symptoms may go away by themselves. RELATED COMPLICATIONS   Persistent pain that may progress to constant pain.  Shoulder stiffness, frozen shoulder syndrome, or loss of motion.  Recurrence of symptoms, especially if treated without surgery.  Inability to return to same level of sports, even with surgery.  Persistent weakness.  Risks of surgery, including infection, bleeding, injury to nerves, shoulder stiffness, weakness, re-tearing of the rotator cuff tendon.  Deltoid detachment, acromial fracture, and persistent pain. TREATMENT Treatment involves the use of ice and medicine to reduce pain and inflammation. Strengthening and stretching exercise are usually recommended. These exercises may be completed at home or with a therapist. You may also be instructed to modify offending activities. Corticosteroid injections may be given to reduce inflammation. Surgery is usually recommended for athletes. Surgery has the best chance for a full recovery. Surgery involves:  Removal of an inflamed bursa.  Removal of an acromial spur if present.  Suturing the torn tendon back together. Rotator cuff surgeries may be preformed either arthroscopically or through an open  incision. Recovery typically takes 6 to 12 months. MEDICATION  If pain medicine is necessary, then  nonsteroidal anti-inflammatory medicines, such as aspirin and ibuprofen, or other minor pain relievers, such as acetaminophen, are often recommended.  Do not take pain medicine for 7 days before surgery.  Prescription pain relievers are usually only prescribed after surgery. Use only as directed and only as much as you need.  Corticosteroid injections may be given to reduce inflammation. However, there is a limited number of times the joint may be injected with these medicines. HEAT AND COLD  Cold treatment (icing) relieves pain and reduces inflammation. Cold treatment should be applied for 10 to 15 minutes every 2 to 3 hours for inflammation and pain and immediately after any activity that aggravates your symptoms. Use ice packs or massage the area with a piece of ice (ice massage).  Heat treatment may be used prior to performing the stretching and strengthening activities prescribed by your caregiver, physical therapist, or athletic trainer. Use a heat pack or soak the injury in warm water. SEEK MEDICAL CARE IF:   Symptoms get worse or do not improve in 4 to 6 weeks despite treatment.  You experience pain, numbness, or coldness in the hand.  Blue, gray, or dark color appears in the fingernails.  New, unexplained symptoms develop (drugs used in treatment may produce side effects). Document Released: 06/29/2005 Document Revised: 09/21/2011 Document Reviewed: 10/11/2008 [facility] Patient Information 2014 [place], [place].

## 2013-11-30 NOTE — Progress Notes (Addendum)
Subjective:    Patient ID: Anthony Juarez, male    DOB: 1946/05/04, 68 y.o.   MRN: [id number] This chart was scribed for Delman Cheadle, MD by Vernell Barrier, Medical Scribe. Patient was seen in room 3. This patient's care was started at 8:29 AM.  Chief Complaint  Patient presents with  . Shoulder Pain    Right, X 5 months  . Establish Care    Referred to you from friend   Shoulder Pain  Pertinent negatives include no fever.   HPI Comments: Anthony Juarez is a 68 y.o. male who presents to the [facility] complaining of right shoulder pain, onset 5 months ago. States he was on a fishing trip in [place] 2014 at a friends house and was going down stairs with limited lighting when he lost his footing at the angle of the stairs and impact was at the right shoulder. Has iced the shoulder and has just been managing over the past few months. Reports difficulty reaching into the fridge on the lower shelf to retrieve something. Pain with abduction. States he tries to work out regularly but is only able to perform certain motions of the right shoulder due to pain.    Has been taking Simvastatin 40 mg for 12 years - breaking the 80 mg pill in half. Maternal grandfather has a heart attack when he was in his 59s to early 40s. Father had a bypass and prostate cancer.  Reports second MTP swelling onset for a while. Has been massaging and stretching to treat.  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 Had seizures as a baby and well into puberty that have since went away. Last seizure he reports was in 1992. Was put on Dilantin while living in [place] but states toxic levels of it built up on his system and he has since been taken off of that and put on Kepra.   Would also like to make Dr. Brigitte Pulse and [facility] permanent PCP. States he was recommended by a friend.  Patient Active Problem List   Diagnosis Date Noted  . AVM (arteriovenous malformation) brain 06/15/2013  . Seizure disorder 01/19/2013  . Central sleep apnea 06/10/2012   Past Medical History  Diagnosis Date  . Asthma     as a child  . Hyperlipidemia   . Seizures   . Prostatitis   . Balanitis   . Benign prostatic hypertrophy   . Cholecystitis with cholelithiasis   . Convulsive disorder   . Hyperlipidemia   . OSA (obstructive sleep apnea)   . Arthritis    Past Surgical History  Procedure Laterality Date  . Elbow surgery  11/11/2000    repair  . Tonsillectomy    . Fracture surgery     Allergies  Allergen Reactions  . Penicillins Rash   Prior to Admission medications   Medication Sig Start Date End Date Taking? Authorizing Provider  aspirin 81 MG tablet Take 81 mg by mouth daily.    Yes Historical Provider, MD  Digestive Enzymes (DIGESTIVE SUPPORT PO) Take 1 tablet by mouth daily as needed. constipation   Yes Historical Provider, MD  Levetiracetam (KEPPRA XR) 750 MG TB24 Take 1 tablet (750 mg total) by mouth daily. 07/31/13  Yes Marcial Pacas, MD  Melatonin 1 MG/4ML LIQD Take 4 mLs by mouth at bedtime.   Yes Historical Provider, MD  Multiple Vitamins-Minerals (MULTIVITAMIN WITH MINERALS) tablet  Take 1 tablet by mouth daily.   Yes Historical Provider, MD  psyllium (METAMUCIL SMOOTH TEXTURE) 28 % packet Take 1 packet by mouth daily.   Yes Historical Provider, MD  simvastatin (ZOCOR) 80 MG tablet Take 40 mg by mouth at bedtime.   Yes Historical Provider, MD   History   Social History  . Marital Status: Married    Spouse Name: N/A    Number of Children: N/A  . Years of Education: N/A   Occupational History  . retired    Social History Main Topics  . Smoking status: Former Smoker -- 1.00 packs/day for 10 years    Quit date: 07/13/1977  . Smokeless tobacco: Never Used  . Alcohol Use: Yes  . Drug Use: No  . Sexual Activity: Not on file   Other Topics Concern  . Not on file   Social History  Narrative  . No narrative on file    Review of Systems  Constitutional: Negative for fever and chills.  HENT: Negative for rhinorrhea and sore throat.   Respiratory: Negative for cough and shortness of breath.   Musculoskeletal: Positive for arthralgias. Negative for back pain and joint swelling.  Skin: Negative for color change and rash.  Neurological: Negative for weakness and headaches.  Psychiatric/Behavioral: Negative for behavioral problems.   BP 122/84  Pulse 53  Temp(Src) 98.9 F (37.2 C) (Oral)  Resp 16  Ht 5' 8.5" (1.74 m)  Wt 179 lb 6.4 oz (81.375 kg)  BMI 26.88 kg/m2  SpO2 99% Objective:  Physical Exam  Vitals reviewed. Constitutional: He is oriented to person, place, and time. He appears well-developed and well-nourished. No distress.  HENT:  Head: Normocephalic and atraumatic.  Eyes: EOM are normal.  Neck: Neck supple. No tracheal deviation present.  Cardiovascular: Normal rate.   Pulmonary/Chest: Effort normal. No respiratory distress.  Musculoskeletal: He exhibits tenderness.       Right shoulder: He exhibits decreased range of motion and tenderness.  RIGHT SHOULDER: Mild tenderness and lengthening of acromioclavicular joint. Mild tenderness over acromion. Severe pain with abduction above 90 degrees. Decreased external rotation. Normal internal rotation. Pain with adduction. No significant pain with Neer's and Hawkin's.   Neurological: He is alert and oriented to person, place, and time.  Skin: Skin is warm and dry.  Psychiatric: He has a normal mood and affect. His behavior is normal.   [facility] primary X-Ray reading performed by Dr. Brigitte Pulse: Bony spurs and arthritic change at acromioclavicular joint but no abnormalities of the humeral head CLINICAL DATA: RIGHT shoulder pain, fell November 2014  EXAM:  RIGHT SHOULDER - 2+ VIEW  COMPARISON: None  FINDINGS:  Osseous demineralization.  Degenerative changes RIGHT AC joint.  No fracture, dislocation, or bone  destruction.  Visualized LEFT ribs intact.  IMPRESSION:  Osseous demineralization with RIGHT AC joint degenerative changes.  No acute abnormalities.   Assessment & Plan:   Other and unspecified hyperlipidemia - Plan: DG Shoulder Right - refill simvastatin 40 after lipid panel returns to ensure at goal. +FHx of CAD  Encounter for long-term (current) use of other medications - Plan: Comprehensive metabolic panel, Lipid panel  Strain of tendon of right rotator cuff - Plan: Comprehensive metabolic panel, Lipid panel  Supraspinatus sprain and strain - try referral for PT. May need to cons ortho eval in future.   I personally performed the services described in this documentation, which was scribed in my presence. The recorded information has been reviewed and considered, and addended by me  as needed.  Delman Cheadle, MD MPH

## 2013-12-04 ENCOUNTER — Encounter: Payer: Self-pay | Admitting: Family Medicine

## 2013-12-06 MED ORDER — SIMVASTATIN 80 MG PO TABS: 40.0000 mg | ORAL_TABLET | Freq: Every day | ORAL | Status: DC

## 2014-01-12 ENCOUNTER — Encounter: Payer: Self-pay | Admitting: Family Medicine

## 2014-01-12 ENCOUNTER — Ambulatory Visit (INDEPENDENT_AMBULATORY_CARE_PROVIDER_SITE_OTHER): Payer: Medicare HMO | Admitting: Family Medicine

## 2014-01-12 VITALS — BP 110/66 | HR 52 | Temp 98.1°F | Resp 16 | Ht 68.5 in | Wt 174.8 lb

## 2014-01-12 DIAGNOSIS — Z0000 Encounter for general adult medical examination without abnormal findings: Secondary | ICD-10-CM

## 2014-01-12 DIAGNOSIS — Z136 Encounter for screening for cardiovascular disorders: Secondary | ICD-10-CM

## 2014-01-12 DIAGNOSIS — Z113 Encounter for screening for infections with a predominantly sexual mode of transmission: Secondary | ICD-10-CM

## 2014-01-12 DIAGNOSIS — Z125 Encounter for screening for malignant neoplasm of prostate: Secondary | ICD-10-CM

## 2014-01-12 DIAGNOSIS — Z23 Encounter for immunization: Secondary | ICD-10-CM

## 2014-01-12 LAB — POCT URINALYSIS DIPSTICK
BILIRUBIN UA: NEGATIVE
Glucose, UA: NEGATIVE
Ketones, UA: NEGATIVE
Leukocytes, UA: NEGATIVE
Nitrite, UA: NEGATIVE
Protein, UA: NEGATIVE
Urobilinogen, UA: 0.2
pH, UA: 7

## 2014-01-12 MED ORDER — ZOSTER VACCINE LIVE 19400 UNT/0.65ML ~~LOC~~ SOLR: 0.6500 mL | Freq: Once | SUBCUTANEOUS | Status: DC

## 2014-01-12 NOTE — Progress Notes (Signed)
Subjective:  This chart was scribed for Anthony Juarez. Brigitte Pulse, MD  by Stacy Gardner, [facility] Scribe. The patient was seen in room 25 and the patient's care was started at 5:36 PM.   Patient ID: Anthony Juarez, male    DOB: [birth year], 68 y.o.   MRN: [id number] Chief Complaint  Patient presents with  . Annual Exam    WELCOME TO MEDICATR    HPI HPI Comments: Anthony Juarez is a 68 y.o. male who arrives to the [facility] for a Welcome to Medicare examination. Pt was seen six weeks with complaints of right shoulder pain after falling downstairs while walking in the dark. Pt was referred to PT for suspected rotator cuff strain. He was on simvastatin  40 mg for 12 for CAD. He did not follow up with the PT and does not have any improvement. Pt mentions the pain is worse. He is unable to sleep due to pain. His lipid panel was well controlled and unchanged. Denies any heart complications. Denies gastric reflux. He requests a shingles vaccination. Pt had the chicken pox when he was 68 years old. He was not tested for Hepatitis C. He is taking a multivitamin with calcium regularly. He his having mild constipation with taking them. Pt has not talked about his Power of Attorney with his wife. He has a Living Will. Pt had a colonoscopy.  He has smoked 100+ cigarettes in his teen to 20's; he stopped smoking in 1979. Pt mentions, years ago, he had right sided abdominal pain that was inconclusive. Pt's prior doctor treated him for IBS and give him antibiotics. He had a gastric emptying procedure at [facility] but he did not go through with the appointment because his symptoms resolved. Pt has bruising to his bilateral hands and reports that he bruises easily.        Patient Active Problem List   Diagnosis Date Noted  . AVM (arteriovenous malformation) brain 06/15/2013  . Seizure disorder 01/19/2013  . Central sleep apnea 06/10/2012   Past Medical History  Diagnosis  Date  . Asthma     as a child  . Hyperlipidemia   . Seizures   . Prostatitis   . Balanitis   . Benign prostatic hypertrophy   . Cholecystitis with cholelithiasis   . Convulsive disorder   . Hyperlipidemia   . OSA (obstructive sleep apnea)   . Arthritis    Past Surgical History  Procedure Laterality Date  . Elbow surgery  11/11/2000    repair  . Tonsillectomy    . Fracture surgery     Allergies  Allergen Reactions  . Penicillins Rash   Prior to Admission medications   Medication Sig Start Date End Date Taking? Authorizing Provider  aspirin 81 MG tablet Take 81 mg by mouth daily.    Yes Historical Provider, MD  Calcium Carbonate-Vitamin D (CALCIUM + D PO) Take by mouth daily.   Yes Historical Provider, MD  Digestive Enzymes (DIGESTIVE SUPPORT PO) Take 1 tablet by mouth daily as needed. constipation   Yes Historical Provider, MD  Levetiracetam (KEPPRA XR) 750 MG TB24 Take 1 tablet (750 mg total) by mouth daily. 07/31/13  Yes Marcial Pacas, MD  Melatonin 1 MG/4ML LIQD Take 4 mLs by mouth at bedtime.   Yes Historical Provider, MD  Multiple Vitamins-Minerals (MULTIVITAMIN WITH MINERALS) tablet Take 1 tablet by mouth daily.   Yes Historical Provider, MD  psyllium (METAMUCIL SMOOTH TEXTURE) 28 % packet  Take 1 packet by mouth daily.   Yes Historical Provider, MD  simvastatin (ZOCOR) 80 MG tablet Take 0.5 tablets (40 mg total) by mouth at bedtime. 12/06/13  Yes Shawnee Knapp, MD   History   Social History  . Marital Status: Married    Spouse Name: N/A    Number of Children: N/A  . Years of Education: N/A   Occupational History  . retired    Social History Main Topics  . Smoking status: Former Smoker -- 1.00 packs/day for 10 years    Quit date: 07/13/1977  . Smokeless tobacco: Never Used  . Alcohol Use: Yes  . Drug Use: No  . Sexual Activity: Not on file   Other Topics Concern  . Not on file   Social History Narrative  . No narrative on file    Review of Systems  HENT:  Positive for postnasal drip.   Gastrointestinal: Positive for constipation. Negative for abdominal pain.  Musculoskeletal: Positive for arthralgias and myalgias.  Hematological: Bruises/bleeds easily.  Psychiatric/Behavioral: Positive for sleep disturbance.       Objective:   Physical Exam  Nursing note and vitals reviewed. Constitutional: He is oriented to person, place, and time. He appears well-developed and well-nourished. No distress.  HENT:  Head: Atraumatic.  Right Ear: Tympanic membrane normal.  Left Ear: Tympanic membrane normal.  Mouth/Throat: Posterior oropharyngeal erythema present.  Eyes: Conjunctivae and EOM are normal.  Neck: Neck supple. No tracheal deviation present.  Cardiovascular: Normal rate, regular rhythm and normal heart sounds.  Exam reveals no gallop and no friction rub.   No murmur heard. Pulmonary/Chest: Effort normal. No respiratory distress.  Abdominal: Soft. He exhibits no distension. There is no hepatosplenomegaly. There is no tenderness. There is no rebound.  Genitourinary: Rectal exam shows external hemorrhoid.  External hemorrhoids. No inflammation or irritation.  Musculoskeletal: Normal range of motion.  Neurological: He is alert and oriented to person, place, and time.  Reflex Scores:      Patellar reflexes are 2+ on the right side and 2+ on the left side. Skin: Skin is warm and dry.  Psychiatric: He has a normal mood and affect. His behavior is normal.   Filed Vitals:   01/12/14 1059  BP: 110/66  Pulse: 52  Temp: 98.1 F (36.7 C)  TempSrc: Oral  Resp: 16  Height: 5' 8.5" (1.74 m)  Weight: 174 lb 12.8 oz (79.289 kg)  SpO2: 98%  DIAGNOSTIC STUDIES: Oxygen Saturation is 98% on room air, normal by my interpretation.   EKG: NSR, no ischemic chasnges COORDINATION OF CARE:  5:36 PM Discussed course of care with pt which includes laboratory tests. .Advised pt to continue to follow up with PT.  Pt understands and agrees.           Assessment & Plan:   Routine general medical examination at a health care facility - Plan: POCT urinalysis dipstick, EKG 12-Lead, CBC, TSH  Need for prophylactic vaccination against Streptococcus pneumoniae (pneumococcus) - Plan: Pneumococcal polysaccharide vaccine 23-valent greater than or equal to 2yo subcutaneous/IM  Screening for prostate cancer - Plan: PSA  Screen for sexually transmitted diseases - Plan: Hepatitis C antibody  Screening for AAA (aortic abdominal aneurysm) - Plan: US Abdomen Limited  Meds ordered this encounter  Medications  . Calcium Carbonate-Vitamin D (CALCIUM + D PO)    Sig: Take by mouth daily.  Marland Kitchen zoster vaccine live, PF, (ZOSTAVAX) 09323 UNT/0.65ML injection    Sig: Inject 19,400 Units into the skin once.  Dispense:  1 vial    Refill:  0    I personally performed the services described in this documentation, which was scribed in my presence. The recorded information has been reviewed and considered, and addended by me as needed.  Delman Cheadle, MD MPH

## 2014-01-12 NOTE — Progress Notes (Signed)
Subjective:    Anthony Juarez is a 68 y.o. male who presents for Medicare Initial preventive examination.   Preventive Screening-Counseling & Management  Tobacco History  Smoking status  . Former Smoker -- 1.00 packs/day for 10 years  . Quit date: 07/13/1977  Smokeless tobacco  . Never Used    Problems Prior to Visit 1. Rotator cuff strain - was referred to PT but has not heard about that yet - seems to be getting worse. 2. BPH - resistant to multiple medications - affected Na  Current Problems (verified) Patient Active Problem List   Diagnosis Date Noted  . AVM (arteriovenous malformation) brain 06/15/2013  . Seizure disorder 01/19/2013  . Central sleep apnea 06/10/2012    Medications Prior to Visit Current Outpatient Prescriptions on File Prior to Visit  Medication Sig Dispense Refill  . aspirin 81 MG tablet Take 81 mg by mouth daily.       . Digestive Enzymes (DIGESTIVE SUPPORT PO) Take 1 tablet by mouth daily as needed. constipation      . Levetiracetam (KEPPRA XR) 750 MG TB24 Take 1 tablet (750 mg total) by mouth daily.  90 tablet  3  . Melatonin 1 MG/4ML LIQD Take 4 mLs by mouth at bedtime.      . Multiple Vitamins-Minerals (MULTIVITAMIN WITH MINERALS) tablet Take 1 tablet by mouth daily.      . psyllium (METAMUCIL SMOOTH TEXTURE) 28 % packet Take 1 packet by mouth daily.      . simvastatin (ZOCOR) 80 MG tablet Take 0.5 tablets (40 mg total) by mouth at bedtime.  90 tablet  1   No current facility-administered medications on file prior to visit.    Current Medications (verified) Current Outpatient Prescriptions  Medication Sig Dispense Refill  . aspirin 81 MG tablet Take 81 mg by mouth daily.       . Calcium Carbonate-Vitamin D (CALCIUM + D PO) Take by mouth daily.      . Digestive Enzymes (DIGESTIVE SUPPORT PO) Take 1 tablet by mouth daily as needed. constipation      . Levetiracetam (KEPPRA XR) 750 MG TB24 Take 1 tablet (750 mg total) by mouth daily.  90  tablet  3  . Melatonin 1 MG/4ML LIQD Take 4 mLs by mouth at bedtime.      . Multiple Vitamins-Minerals (MULTIVITAMIN WITH MINERALS) tablet Take 1 tablet by mouth daily.      . psyllium (METAMUCIL SMOOTH TEXTURE) 28 % packet Take 1 packet by mouth daily.      . simvastatin (ZOCOR) 80 MG tablet Take 0.5 tablets (40 mg total) by mouth at bedtime.  90 tablet  1   No current facility-administered medications for this visit.     Allergies (verified) Penicillins   PAST HISTORY  Family History Family History  Problem Relation Age of Onset  . Heart disease Father     cabg 64's  . Cancer Father     Prostate  . Heart disease Maternal Grandfather     Late 54's    Social History History  Substance Use Topics  . Smoking status: Former Smoker -- 1.00 packs/day for 10 years    Quit date: 07/13/1977  . Smokeless tobacco: Never Used  . Alcohol Use: Yes    Are there smokers in your home (other than you)?  No  Risk Factors Current exercise habits: Home exercise routine includes walking. Exercise is limited by orthopedic condition(s): shoulder pain from rotator cuff strain - physical therapy appt pending.Marland Kitchen  Dietary issues discussed: low sat, low fat, continue on calcium supplement  Cardiac risk factors: advanced age (older than 51 for men, 13 for women), dyslipidemia, family history of premature cardiovascular disease and male gender.  Depression Screen (Note: if answer to either of the following is "Yes", a more complete depression screening is indicated)   Q1: Over the past two weeks, have you felt down, depressed or hopeless? No  Q2: Over the past two weeks, have you felt little interest or pleasure in doing things? No  Have you lost interest or pleasure in daily life? No  Do you often feel hopeless? No  Do you cry easily over simple problems? No  Activities of Daily Living In your present state of health, do you have any difficulty performing the following activities?:  Driving?  No Managing money?  No Feeding yourself? No Getting from bed to chair? No Climbing a flight of stairs? No Preparing food and eating?: No Bathing or showering? No Getting dressed: No Getting to the toilet? No Using the toilet:No Moving around from place to place: No In the past year have you fallen or had a near fall?:Yes   Are you sexually active?  Yes  Do you have more than one partner?  No  Hearing Difficulties: No Do you often ask people to speak up or repeat themselves? No Do you experience ringing or noises in your ears? No Do you have difficulty understanding soft or whispered voices? No   Do you feel that you have a problem with memory? No  Do you often misplace items? No  Do you feel safe at home?  No  Cognitive Testing  Alert? No  Normal Appearance?No  Oriented to person? No  Place? No   Time? No  Recall of three objects?  Yes  Can perform simple calculations? Yes  Displays appropriate judgment?Yes  Can read the correct time from a watch face?Yes   Advanced Directives have been discussed with the patient? No   List the Names of Other Physician/Practitioners you currently use: 1.  [facility] 2. [facility]/p PT appt P  Indicate any recent Medical Services you may have received from other than [facility] providers in the past year (date may be approximate).  Immunization History  Administered Date(s) Administered  . DTaP 01/15/2011  . Influenza Whole 05/22/2011, 04/12/2012  . Pneumococcal Polysaccharide-23 01/12/2014    Screening Tests Health Maintenance  Topic Date Due  . Tetanus/tdap  04/11/1967  . Colonoscopy  04/10/1998  . Zostavax  04/10/2008  . Pneumococcal Polysaccharide Vaccine Age 49 And Over  04/10/2013  . Influenza Vaccine  02/10/2014    All answers were reviewed with the patient and necessary referrals were made:  [facility], [place]   01/12/2014   History reviewed: allergies, current medications, past family history, past medical history,  past social history, past surgical history and problem list  Review of Systems Pertinent items are noted in HPI.   See other note, + for tinnitus, Weiss ring vision prob, joint pain and swelling, and seasonal allergies - all other neg/normal Objective:       Visual Acuity Screening   Right eye Left eye Both eyes  Without correction:     With correction: 20/20 20/20 20/20   Hearing Screening Comments: WHISPER TEST: PASSED   Vision by Snellen chart: right eye:20/20, left eye:20/20 Blood pressure 110/66, pulse 52, temperature 98.1 F (36.7 C), temperature source Oral, resp. rate 16, height 5' 8.5" (1.74 m), weight 174 lb 12.8  oz (79.289 kg), SpO2 98.00%. Body mass index is 26.19 kg/(m^2).  BP 110/66  Pulse 52  Temp(Src) 98.1 F (36.7 C) (Oral)  Resp 16  Ht 5' 8.5" (1.74 m)  Wt 174 lb 12.8 oz (79.289 kg)  BMI 26.19 kg/m2  SpO2 98%  General Appearance:    Alert, cooperative, no distress, appears stated age  Head:    Normocephalic, without obvious abnormality, atraumatic  Eyes:    PERRL, conjunctiva/corneas clear, EOM's intact, fundi    benign, both eyes       Ears:    Normal TM's and external ear canals, both ears  Nose:   Nares normal, septum midline, mucosa normal, no drainage    or sinus tenderness  Throat:   Lips, mucosa, and tongue normal; teeth and gums normal  Neck:   Supple, symmetrical, trachea midline, no adenopathy;       thyroid:  No enlargement/tenderness/nodules; no carotid   bruit or JVD  Back:     Symmetric, no curvature, ROM normal, no CVA tenderness  Lungs:     Clear to auscultation bilaterally, respirations unlabored  Chest wall:    No tenderness or deformity  Heart:    Regular rate and rhythm, S1 and S2 normal, no murmur, rub   or gallop  Abdomen:     Soft, non-tender, bowel sounds active all four quadrants,    no masses, no organomegaly     Rectal:    Normal tone, normal prostate, no masses or tenderness  Extremities:   Extremities normal, atraumatic, no  cyanosis or edema  Pulses:   2+ and symmetric all extremities  Skin:   Skin color, texture, turgor normal, no rashes or lesions  Lymph nodes:   Cervical, supraclavicular, and axillary nodes normal  Neurologic:   CNII-XII intact. Normal strength, sensation and reflexes      throughout      EKG: NSR, no ischemic changes. Assessment:     Welcome to Medicare Exam      Plan:     During the course of the visit the patient was educated and counseled about appropriate screening and preventive services including:    Pneumococcal vaccine - 23 pneumovax given today - needs 13 in 1 yr.  Td vaccine - done 2012  Screening electrocardiogram - scanned in  Prostate cancer screening - psa annually as father w/ prostate cancer  Colorectal cancer screening - nml colonoscopy 2012  Diabetes screening  Advanced directives: has an advanced directive - a copy HAS NOT been provided.  AAA screening - cancelled [place] as had abd [place] 2-3 yrs ago which showed nml abd aorta. No further screening needed.  Pt needs zoster vaccine - rx given to get from pharmacy.  Diet review for nutrition referral? Yes ____  Not Indicated _X___   Patient Instructions (the written plan) was given to the patient.  Medicare Attestation I have personally reviewed: The patient's medical and social history Their use of alcohol, tobacco or illicit drugs Their current medications and supplements The patient's functional ability including ADLs,fall risks, home safety risks, cognitive, and hearing and visual impairment Diet and physical activities Evidence for depression or mood disorders  The patient's weight, height, BMI, and visual acuity have been recorded in the chart.  I have made referrals, counseling, and provided education to the patient based on review of the above and I have provided the patient with a written personalized care plan for preventive services.     Delman Cheadle, MD   01/12/2014

## 2014-01-12 NOTE — Progress Notes (Deleted)
Subjective:  This chart was scribed for [name]. Brigitte Pulse, MD  by Stacy Gardner, [facility] Scribe. The patient was seen in room 25 and the patient's care was started at 11:51 AM.   Patient ID: Anthony Juarez, male    DOB: 1945-12-14, 68 y.o.   MRN: [id number] Chief Complaint  Patient presents with   Annual Exam    WELCOME TO [facility]    HPI HPI Comments: Anthony Juarez is a 68 y.o. male who arrives to the [facility] for a Welcome to Medicare examination. Pt was seen six weeks with complaints of right shoulder pain after falling downstairs while walking in the dark. Pt was referred to PT for suspected rotator cuff strain. He was on simvastatin  40 mg for 12 for CAD. He did not follow up with the PT and does not have any improvement. Pt mentions the pain is worse. He is unable to sleep due to pain. His lipid panel was well controlled and unchanged. Denies any heart complications. Denies gastric reflux. He requests a shingles vaccination. Pt had the chicken pox when he was 68 years old. He was not tested for Hepatitis C. He is taking a multivitamin with calcium regularly. He his having mild constipation with taking them. Pt has not talked about his Power of Attorney with his wife. He has a Living Will. Pt had a colonoscopy.  He has smoked 100+ cigarettes in his teen to 20's; he stopped smoking in 1979. Pt mentions, years ago, he had right sided abdominal pain that was inconclusive. Pt's prior doctor treated him for IBS and give him antibiotics. He had a gastric emptying procedure at [facility] but he did not go through with the appointment because his symptoms resolved. Pt has bruising to his bilateral hands and reports that he bruises easily.        Patient Active Problem List   Diagnosis Date Noted   AVM (arteriovenous malformation) brain 06/15/2013   Seizure disorder 01/19/2013   Central sleep apnea 06/10/2012   Past Medical History    Diagnosis Date   Asthma     as a child   Hyperlipidemia    Seizures    Prostatitis    Balanitis    Benign prostatic hypertrophy    Cholecystitis with cholelithiasis    Convulsive disorder    Hyperlipidemia    OSA (obstructive sleep apnea)    Arthritis    Past Surgical History  Procedure Laterality Date   Elbow surgery  11/11/2000    repair   Tonsillectomy     Fracture surgery     Allergies  Allergen Reactions   Penicillins Rash   Prior to Admission medications   Medication Sig Start Date End Date Taking? Authorizing Provider  aspirin 81 MG tablet Take 81 mg by mouth daily.    Yes Historical Provider, MD  Calcium Carbonate-Vitamin D (CALCIUM + D PO) Take by mouth daily.   Yes Historical Provider, MD  Digestive Enzymes (DIGESTIVE SUPPORT PO) Take 1 tablet by mouth daily as needed. constipation   Yes Historical Provider, MD  Levetiracetam (KEPPRA XR) 750 MG TB24 Take 1 tablet (750 mg total) by mouth daily. 07/31/13  Yes Marcial Pacas, MD  Melatonin 1 MG/4ML LIQD Take 4 mLs by mouth at bedtime.   Yes Historical Provider, MD  Multiple Vitamins-Minerals (MULTIVITAMIN WITH MINERALS) tablet Take 1 tablet by mouth daily.   Yes Historical Provider, MD  psyllium (METAMUCIL SMOOTH TEXTURE) 28 %  packet Take 1 packet by mouth daily.   Yes Historical Provider, MD  simvastatin (ZOCOR) 80 MG tablet Take 0.5 tablets (40 mg total) by mouth at bedtime. 12/06/13  Yes Shawnee Knapp, MD   History   Social History   Marital Status: Married    Spouse Name: N/A    Number of Children: N/A   Years of Education: N/A   Occupational History   retired    Social History Main Topics   Smoking status: Former Smoker -- 1.00 packs/day for 10 years    Quit date: 07/13/1977   Smokeless tobacco: Never Used   Alcohol Use: Yes   Drug Use: No   Sexual Activity: Not on file   Other Topics Concern   Not on file   Social History Narrative   No narrative on file    Review of Systems   HENT: Positive for postnasal drip.   Gastrointestinal: Positive for constipation. Negative for abdominal pain.  Musculoskeletal: Positive for arthralgias and myalgias.  Hematological: Bruises/bleeds easily.  Psychiatric/Behavioral: Positive for sleep disturbance.       Objective:   Physical Exam  Nursing note and vitals reviewed. Constitutional: He is oriented to person, place, and time. He appears well-developed and well-nourished. No distress.  HENT:  Head: Atraumatic.  Right Ear: Tympanic membrane normal.  Left Ear: Tympanic membrane normal.  Mouth/Throat: Posterior oropharyngeal erythema present.  Eyes: Conjunctivae and EOM are normal.  Neck: Neck supple. No tracheal deviation present.  Cardiovascular: Normal rate, regular rhythm and normal heart sounds.  Exam reveals no gallop and no friction rub.   No murmur heard. Pulmonary/Chest: Effort normal. No respiratory distress.  Abdominal: Soft. He exhibits no distension. There is no hepatosplenomegaly. There is no tenderness. There is no rebound.  Genitourinary: Rectal exam shows external hemorrhoid.  External hemorrhoids. No inflammation or irritation.  Musculoskeletal: Normal range of motion.  Neurological: He is alert and oriented to person, place, and time.  Reflex Scores:      Patellar reflexes are 2+ on the right side and 2+ on the left side. Skin: Skin is warm and dry.  Psychiatric: He has a normal mood and affect. His behavior is normal.   Filed Vitals:   01/12/14 1059  BP: 110/66  Pulse: 52  Temp: 98.1 F (36.7 C)  TempSrc: Oral  Resp: 16  Height: 5' 8.5" (1.74 m)  Weight: 174 lb 12.8 oz (79.289 kg)  SpO2: 98%  DIAGNOSTIC STUDIES: Oxygen Saturation is 98% on room air, normal by my interpretation.   EKG: NSR, no ischemic chasnges COORDINATION OF CARE:  11:53 AM Discussed course of care with pt which includes laboratory tests. .Advised pt to continue to follow up with PT.  Pt understands and agrees.          Assessment & Plan:   Routine general medical examination at a health care facility - Plan: POCT urinalysis dipstick, EKG 12-Lead, CBC, TSH  Need for prophylactic vaccination against Streptococcus pneumoniae (pneumococcus) - Plan: Pneumococcal polysaccharide vaccine 23-valent greater than or equal to 2yo subcutaneous/IM  Screening for prostate cancer - Plan: PSA  Screen for sexually transmitted diseases - Plan: Hepatitis C antibody  Screening for AAA (aortic abdominal aneurysm) - Plan: US Abdomen Limited  Meds ordered this encounter  Medications   Calcium Carbonate-Vitamin D (CALCIUM + D PO)    Sig: Take by mouth daily.   zoster vaccine live, PF, (ZOSTAVAX) 27253 UNT/0.65ML injection    Sig: Inject 19,400 Units into the skin once.  Dispense:  1 vial    Refill:  0    I personally performed the services described in this documentation, which was scribed in my presence. The recorded information has been reviewed and considered, and addended by me as needed.  Delman Cheadle, MD MPH

## 2014-01-12 NOTE — Progress Notes (Signed)
   Subjective:    Patient ID: Anthony Juarez, male    DOB: 11/06/1945, 68 y.o.   MRN: [id number]  HPI    Review of Systems  Constitutional: Negative.   HENT: Positive for tinnitus.   Eyes: Positive for visual disturbance.       Weiss ring  Cardiovascular: Negative.   Gastrointestinal: Negative.   Endocrine: Negative.   Musculoskeletal: Positive for arthralgias and joint swelling.  Skin: Negative.   Allergic/Immunologic: Positive for environmental allergies.  Neurological: Negative.   Hematological: Negative.   Psychiatric/Behavioral: Negative.        Objective:   Physical Exam        Assessment & Plan:

## 2014-01-12 NOTE — Patient Instructions (Signed)
Take the prescription to [facility] to get your shingles vaccine.  Keeping you healthy  Get these tests  Blood pressure- Have your blood pressure checked once a year by your healthcare provider.  Normal blood pressure is 120/80  Weight- Have your body mass index (BMI) calculated to screen for obesity.  BMI is a measure of body fat based on height and weight. You can also calculate your own BMI at ViewBanking.si.  Cholesterol- Have your cholesterol checked every year.  Diabetes- Have your blood sugar checked regularly if you have high blood pressure, high cholesterol, have a family history of diabetes or if you are overweight.  Screening for Colon Cancer- Colonoscopy starting at age 37.  Screening may begin sooner depending on your family history and other health conditions. Follow up colonoscopy as directed by your Gastroenterologist.  Screening for Prostate Cancer- Both blood work (PSA) and a rectal exam help screen for Prostate Cancer.  Screening begins at age 66 with African-American men and at age 62 with Caucasian men.  Screening may begin sooner depending on your family history.  Take these medicines  Aspirin- One aspirin daily can help prevent Heart disease and Stroke.  Flu shot- Every fall.  Tetanus- Every 10 years.  Zostavax- Once after the age of 32 to prevent Shingles.  Pneumonia shot- Once after the age of 2; if you are younger than 71, ask your healthcare provider if you need a Pneumonia shot.  Take these steps  Don't smoke- If you do smoke, talk to your doctor about quitting.  For tips on how to quit, go to www.smokefree.gov or call 1-800-QUIT-NOW.  Be physically active- Exercise 5 days a week for at least 30 minutes.  If you are not already physically active start slow and gradually work up to 30 minutes of moderate physical activity.  Examples of moderate activity include walking briskly, mowing the yard, dancing, swimming, bicycling, etc.  Eat a  healthy diet- Eat a variety of healthy food such as fruits, vegetables, low fat milk, low fat cheese, yogurt, lean meant, poultry, fish, beans, tofu, etc. For more information go to www.thenutritionsource.org  Drink alcohol in moderation- Limit alcohol intake to less than two drinks a day. Never drink and drive.  Dentist- Brush and floss twice daily; visit your dentist twice a year.  Depression- Your emotional health is as important as your physical health. If you're feeling down, or losing interest in things you would normally enjoy please talk to your healthcare provider.  Eye exam- Visit your eye doctor every year.  Safe sex- If you may be exposed to a sexually transmitted infection, use a condom.  Seat belts- Seat belts can save your life; always wear one.  Smoke/Carbon Monoxide detectors- These detectors need to be installed on the appropriate level of your home.  Replace batteries at least once a year.  Skin cancer- When out in the sun, cover up and use sunscreen 15 SPF or higher.  Violence- If anyone is threatening you, please tell your healthcare provider.  Living Will/ Health care power of attorney- Speak with your healthcare provider and family.

## 2014-01-13 LAB — CBC
HEMATOCRIT: 45 % (ref 39.0–52.0)
Hemoglobin: 15.2 g/dL (ref 13.0–17.0)
MCH: 29.5 pg (ref 26.0–34.0)
MCHC: 33.8 g/dL (ref 30.0–36.0)
MCV: 87.2 fL (ref 78.0–100.0)
Platelets: 262 10*3/uL (ref 150–400)
RBC: 5.16 MIL/uL (ref 4.22–5.81)
RDW: 13.7 % (ref 11.5–15.5)
WBC: 9.8 10*3/uL (ref 4.0–10.5)

## 2014-01-13 LAB — HEPATITIS C ANTIBODY: HCV Ab: NEGATIVE

## 2014-01-13 LAB — TSH: TSH: 1.86 u[IU]/mL (ref 0.350–4.500)

## 2014-01-15 ENCOUNTER — Encounter: Payer: Self-pay | Admitting: Family Medicine

## 2014-01-15 LAB — PSA: PSA: 0.81 ng/mL (ref <=4.00)

## 2014-01-18 ENCOUNTER — Ambulatory Visit: Payer: Medicare HMO | Attending: Family Medicine | Admitting: Physical Therapy

## 2014-01-18 DIAGNOSIS — IMO0001 Reserved for inherently not codable concepts without codable children: Secondary | ICD-10-CM | POA: Insufficient documentation

## 2014-01-18 DIAGNOSIS — M25519 Pain in unspecified shoulder: Secondary | ICD-10-CM | POA: Insufficient documentation

## 2014-01-20 ENCOUNTER — Encounter: Payer: Self-pay | Admitting: Family Medicine

## 2014-01-22 ENCOUNTER — Ambulatory Visit
Admission: RE | Admit: 2014-01-22 | Discharge: 2014-01-22 | Disposition: A | Discharge status: Home / Self Care | Payer: Medicare HMO | Source: Ambulatory Visit | Attending: Family Medicine | Admitting: Family Medicine

## 2014-01-22 DIAGNOSIS — Z136 Encounter for screening for cardiovascular disorders: Secondary | ICD-10-CM

## 2014-01-24 ENCOUNTER — Encounter: Payer: Self-pay | Admitting: Family Medicine

## 2014-01-30 ENCOUNTER — Ambulatory Visit: Payer: Medicare HMO | Admitting: Physical Therapy

## 2014-02-01 ENCOUNTER — Ambulatory Visit: Payer: Medicare HMO | Admitting: Rehabilitation

## 2014-02-01 DIAGNOSIS — IMO0001 Reserved for inherently not codable concepts without codable children: Secondary | ICD-10-CM | POA: Diagnosis not present

## 2014-02-06 ENCOUNTER — Ambulatory Visit: Payer: Medicare HMO | Admitting: Physical Therapy

## 2014-02-06 DIAGNOSIS — IMO0001 Reserved for inherently not codable concepts without codable children: Secondary | ICD-10-CM | POA: Diagnosis not present

## 2014-02-06 NOTE — Addendum Note (Signed)
Addended by: Delman Cheadle on: 02/06/2014 10:01 PM   Modules accepted: Level of Service

## 2014-02-08 ENCOUNTER — Ambulatory Visit: Payer: Medicare HMO | Admitting: Rehabilitation

## 2014-02-08 DIAGNOSIS — IMO0001 Reserved for inherently not codable concepts without codable children: Secondary | ICD-10-CM | POA: Diagnosis not present

## 2014-02-13 ENCOUNTER — Ambulatory Visit: Payer: Medicare HMO | Attending: Family Medicine | Admitting: Rehabilitation

## 2014-02-13 DIAGNOSIS — IMO0001 Reserved for inherently not codable concepts without codable children: Secondary | ICD-10-CM | POA: Insufficient documentation

## 2014-02-13 DIAGNOSIS — M25519 Pain in unspecified shoulder: Secondary | ICD-10-CM | POA: Diagnosis not present

## 2014-02-19 ENCOUNTER — Ambulatory Visit: Payer: Medicare HMO | Admitting: Rehabilitation

## 2014-02-19 DIAGNOSIS — IMO0001 Reserved for inherently not codable concepts without codable children: Secondary | ICD-10-CM | POA: Diagnosis not present

## 2014-02-22 ENCOUNTER — Ambulatory Visit: Payer: Medicare HMO | Admitting: Rehabilitation

## 2014-02-22 DIAGNOSIS — IMO0001 Reserved for inherently not codable concepts without codable children: Secondary | ICD-10-CM | POA: Diagnosis not present

## 2014-02-27 ENCOUNTER — Ambulatory Visit: Payer: Medicare HMO | Admitting: Physical Therapy

## 2014-02-27 DIAGNOSIS — IMO0001 Reserved for inherently not codable concepts without codable children: Secondary | ICD-10-CM | POA: Diagnosis not present

## 2014-03-01 ENCOUNTER — Ambulatory Visit: Payer: Medicare HMO | Admitting: Rehabilitation

## 2014-03-01 DIAGNOSIS — IMO0001 Reserved for inherently not codable concepts without codable children: Secondary | ICD-10-CM | POA: Diagnosis not present

## 2014-03-12 ENCOUNTER — Encounter: Payer: Medicare HMO | Admitting: Rehabilitation

## 2014-03-20 ENCOUNTER — Ambulatory Visit: Payer: Medicare HMO | Attending: Family Medicine | Admitting: Physical Therapy

## 2014-03-20 DIAGNOSIS — IMO0001 Reserved for inherently not codable concepts without codable children: Secondary | ICD-10-CM | POA: Insufficient documentation

## 2014-03-20 DIAGNOSIS — M25519 Pain in unspecified shoulder: Secondary | ICD-10-CM | POA: Insufficient documentation

## 2014-03-27 ENCOUNTER — Ambulatory Visit: Payer: Medicare HMO | Admitting: Physical Therapy

## 2014-03-27 DIAGNOSIS — IMO0001 Reserved for inherently not codable concepts without codable children: Secondary | ICD-10-CM | POA: Diagnosis not present

## 2014-03-28 ENCOUNTER — Ambulatory Visit: Payer: Medicare HMO | Admitting: Rehabilitation

## 2014-03-28 DIAGNOSIS — IMO0001 Reserved for inherently not codable concepts without codable children: Secondary | ICD-10-CM | POA: Diagnosis not present

## 2014-07-31 ENCOUNTER — Ambulatory Visit (INDEPENDENT_AMBULATORY_CARE_PROVIDER_SITE_OTHER): Payer: Medicare HMO | Admitting: Neurology

## 2014-07-31 ENCOUNTER — Encounter: Payer: Self-pay | Admitting: Neurology

## 2014-07-31 VITALS — BP 138/67 | HR 72 | Ht 69.0 in | Wt 181.0 lb

## 2014-07-31 DIAGNOSIS — Q283 Other malformations of cerebral vessels: Secondary | ICD-10-CM

## 2014-07-31 DIAGNOSIS — G40909 Epilepsy, unspecified, not intractable, without status epilepticus: Secondary | ICD-10-CM

## 2014-07-31 DIAGNOSIS — Q282 Arteriovenous malformation of cerebral vessels: Secondary | ICD-10-CM

## 2014-07-31 MED ORDER — LEVETIRACETAM ER 750 MG PO TB24: 750.0000 mg | ORAL_TABLET | Freq: Every day | ORAL | Status: DC

## 2014-07-31 NOTE — Progress Notes (Signed)
HPI:   Anthony Juarez is a 69 yo WM, following up for seizure disorder  He has PMHx of seizure since infant, he was treated with dilantin and phenobarbital for a long time, was eventurally tapered off after seizure free since age 60, he had one recurrent seizure at age 66, was put back on dilantin 400mg  qhs, has been on it since.  He had Bachelor's degree, retired as Location manager, Insurance underwriter  in 2009, moved to [place] about 2.5 years ago, now he stays at home most of time, rarely initiated activities, felt fatigue, difficulty concontrating, getting worse since summer of 2013.  His wife complains that he is not up to his house projects anymore, frequent nocturia, which has prompted recent treatment with desmopression, which helped his frequent night time awaking,  He also has sleep apnea, but could not tolerate his BiPAP machine.He is seen by Dr. Gwenette Greet.  Dilantin level was 32 while he was taking Dilantin 100 mg 4 tablets each night, level decreased to 22 while he was taking Dilantin 100 mg 3 tablets each night, he has developed gingival hypertrophy, his brain fogginess has much improved with decreased dose of Dilantin, especially after he stopped taking Desopressin,   MRI scan of the brain showed a cavernous angioma with remote age hemorrhage in the right frontal subcortical region with  and adjacent  venous angioma.  There are moderate changes of chronic microvascular ischemia and mild degree of generalized cerebral atrophy.  Followup visit today patient has been switched to Keppra XR 750mg  daily. He is off Dilantin totally and his brain fogginess has improved. He has not had further seizure activity.  UPDATE Jan 19th 2015: He is now taking keppra xr 750mg  qhs, last seizure 1992, nocturnal seizure,  he only has mild fatigue, no longer having brain foggy sensation We have reviewed MRI taking January 2015, continue the right inferior frontal cavernous venous angioma, mild atrophy,  moderate small vessel disease,  UPDATE Jan 19th 2016; I have reviewed MRI brain (with and without): right frontal juxtacortical T2 heterogenous lesion (1.4x1.0cm), with "popcorn" appearance, consistent with cavernous malformation. There is an associated small developmental venous anomaly.  Multiple round and ovoid, periventricular, subcortical and juxtacortical T2 hyperintense foci. These findings are non-specific and considerations include autoimmune, inflammatory, post-infectious, microvascular ischemic or migraine associated etiologies.   He is taking Keppra XR 750 mg every night, there was no recurrent seizure  ROS: Frequency of urination, constipation, ringing ears, bruise easily    Physical Exam General: well developed, well nourished, seated, in no evident distress Head: head normocephalic and atraumatic. Oropharynx benign Neck: supple with no carotid  bruits Cardiovascular: regular rate and rhythm, no murmurs  Neurologic Exam Mental Status: Awake and fully alert. Oriented to place and time. Follows all commands. Speech and language normal.  MMSE deferred Cranial Nerves: Fundoscopic exam reveals sharp disc margins. Pupils equal, briskly reactive to light. Extraocular movements full without nystagmus. Visual fields full to confrontation. Hearing intact and symmetric to finger snap. Facial sensation intact. Face, tongue, palate move normally and symmetrically. Neck flexion and extension normal.  Motor: Normal bulk and tone. Normal strength in all tested extremity muscles.No focal weakness Sensory.: intact to touch and pinprick and vibratory.  Coordination: Rapid alternating movements normal in all extremities. Finger-to-nose and heel-to-shin performed accurately bilaterally. No dysmetria Gait and Station: Arises from chair without difficulty. Stance is normal. Gait demonstrates normal stride length and balance . Able to heel, toe and tandem walk without difficulty.  Reflexes: 2+ and  symmetric. Toes downgoing.   ASSESSMENT: History of seizure disorder currently stable on Keppra Right frontal cavernous angioma on MRI of the brain, with chronic microvascular ischemia and mild degree of generalized cerebral atrophy  PLAN: Pt will continue Keppra 750 XR  Return to clinic in one year. Repeat MRI of the brain at the end of 2016  No orders of the defined types were placed in this encounter.      Medications Discontinued During This Encounter  Medication Reason  . Levetiracetam (KEPPRA XR) 750 MG TB24 Reorder     Return in about 1 year (around 08/01/2015).  Marcial Pacas, M.D. Ph.D.  [facility] [place], [place] [address] Phone: [phone] Fax:      [phone]

## 2015-03-01 ENCOUNTER — Other Ambulatory Visit: Payer: Self-pay | Admitting: Family Medicine

## 2015-03-24 ENCOUNTER — Ambulatory Visit (INDEPENDENT_AMBULATORY_CARE_PROVIDER_SITE_OTHER): Payer: Medicare HMO | Admitting: Family Medicine

## 2015-03-24 ENCOUNTER — Ambulatory Visit (INDEPENDENT_AMBULATORY_CARE_PROVIDER_SITE_OTHER): Payer: Medicare HMO

## 2015-03-24 VITALS — BP 118/70 | HR 57 | Temp 97.9°F | Resp 18 | Ht 70.0 in | Wt 175.0 lb

## 2015-03-24 DIAGNOSIS — R0789 Other chest pain: Secondary | ICD-10-CM

## 2015-03-24 DIAGNOSIS — Z1389 Encounter for screening for other disorder: Secondary | ICD-10-CM | POA: Diagnosis not present

## 2015-03-24 DIAGNOSIS — R5383 Other fatigue: Secondary | ICD-10-CM

## 2015-03-24 DIAGNOSIS — Z113 Encounter for screening for infections with a predominantly sexual mode of transmission: Secondary | ICD-10-CM

## 2015-03-24 DIAGNOSIS — N400 Enlarged prostate without lower urinary tract symptoms: Secondary | ICD-10-CM | POA: Diagnosis not present

## 2015-03-24 DIAGNOSIS — R0609 Other forms of dyspnea: Secondary | ICD-10-CM | POA: Diagnosis not present

## 2015-03-24 DIAGNOSIS — Z8709 Personal history of other diseases of the respiratory system: Secondary | ICD-10-CM

## 2015-03-24 DIAGNOSIS — R351 Nocturia: Secondary | ICD-10-CM | POA: Diagnosis not present

## 2015-03-24 DIAGNOSIS — G4731 Primary central sleep apnea: Secondary | ICD-10-CM | POA: Diagnosis not present

## 2015-03-24 DIAGNOSIS — Z1383 Encounter for screening for respiratory disorder NEC: Secondary | ICD-10-CM

## 2015-03-24 DIAGNOSIS — Z125 Encounter for screening for malignant neoplasm of prostate: Secondary | ICD-10-CM | POA: Diagnosis not present

## 2015-03-24 DIAGNOSIS — Z136 Encounter for screening for cardiovascular disorders: Secondary | ICD-10-CM | POA: Diagnosis not present

## 2015-03-24 DIAGNOSIS — R001 Bradycardia, unspecified: Secondary | ICD-10-CM | POA: Diagnosis not present

## 2015-03-24 DIAGNOSIS — R0600 Dyspnea, unspecified: Secondary | ICD-10-CM

## 2015-03-24 LAB — POCT CBC
Granulocyte percent: 56.5 %G (ref 37–80)
HEMATOCRIT: 44.3 % (ref 43.5–53.7)
Hemoglobin: 14.2 g/dL (ref 14.1–18.1)
Lymph, poc: 2.7 (ref 0.6–3.4)
MCH, POC: 28.4 pg (ref 27–31.2)
MCHC: 32.1 g/dL (ref 31.8–35.4)
MCV: 88.5 fL (ref 80–97)
MID (cbc): 0.7 (ref 0–0.9)
MPV: 6.4 fL (ref 0–99.8)
PLATELET COUNT, POC: 304 10*3/uL (ref 142–424)
POC Granulocyte: 4.4 (ref 2–6.9)
POC LYMPH %: 35.1 % (ref 10–50)
POC MID %: 8.4 %M (ref 0–12)
RBC: 5.01 M/uL (ref 4.69–6.13)
RDW, POC: 13.6 %
WBC: 7.8 10*3/uL (ref 4.6–10.2)

## 2015-03-24 LAB — COMPREHENSIVE METABOLIC PANEL
ALT: 10 U/L (ref 9–46)
AST: 24 U/L (ref 10–35)
Albumin: 3.7 g/dL (ref 3.6–5.1)
Alkaline Phosphatase: 50 U/L (ref 40–115)
BILIRUBIN TOTAL: 1 mg/dL (ref 0.2–1.2)
BUN: 13 mg/dL (ref 7–25)
CO2: 26 mmol/L (ref 20–31)
CREATININE: 0.97 mg/dL (ref 0.70–1.25)
Calcium: 9.1 mg/dL (ref 8.6–10.3)
Chloride: 105 mmol/L (ref 98–110)
Glucose, Bld: 86 mg/dL (ref 65–99)
Potassium: 4.7 mmol/L (ref 3.5–5.3)
SODIUM: 139 mmol/L (ref 135–146)
TOTAL PROTEIN: 6.3 g/dL (ref 6.1–8.1)

## 2015-03-24 LAB — LIPID PANEL
CHOL/HDL RATIO: 2.1 ratio (ref <=5.0)
Cholesterol: 175 mg/dL (ref 125–200)
HDL: 83 mg/dL (ref >=40)
LDL Cholesterol: 80 mg/dL (ref <130)
TRIGLYCERIDES: 61 mg/dL (ref <150)
VLDL: 12 mg/dL (ref <30)

## 2015-03-24 LAB — HIV ANTIBODY (ROUTINE TESTING W REFLEX): HIV 1&2 Ab, 4th Generation: NONREACTIVE

## 2015-03-24 LAB — POCT SEDIMENTATION RATE: POCT SED RATE: 11 mm/hr (ref 0–22)

## 2015-03-24 LAB — TSH: TSH: 1.543 u[IU]/mL (ref 0.350–4.500)

## 2015-03-24 MED ORDER — ALBUTEROL SULFATE (2.5 MG/3ML) 0.083% IN NEBU
2.5000 mg | INHALATION_SOLUTION | Freq: Once | RESPIRATORY_TRACT | Status: AC
  Administered 2015-03-24: 2.5 mg via RESPIRATORY_TRACT

## 2015-03-24 MED ORDER — IPRATROPIUM BROMIDE 0.02 % IN SOLN
0.5000 mg | Freq: Once | RESPIRATORY_TRACT | Status: AC
  Administered 2015-03-24: 0.5 mg via RESPIRATORY_TRACT

## 2015-03-24 NOTE — Progress Notes (Signed)
Subjective:  This chart was scribed for Delman Cheadle, MD by [name], Medical Scribe. This patient was seen in Room 4 and the patient's care was started 10:17 AM.    Patient ID: Anthony Juarez, male    DOB: [birth year], 69 y.o.   MRN: [id number] Chief Complaint  Patient presents with  . Shortness of Breath    off and on all summer   . Chest Pain    pressure  . low pulse  . Hypertension    at times     HPI Anthony Juarez is a 69 y.o. male who presents to [facility] complaining of intermittent shortness of breath throughout summer with associated chest pressure.  Last seen pt for full physical a year ago for medicare visit. He has a very distant history of 10 year smoking, stopped in 1979. He also has a pmh for central sleep apnea (followed by Dr. Gwenette Greet) and seizure disorder (followed by Dr. Krista Blue in neurology). He is due for his prevnar and flu shot.   About 10 years ago in [place], one Sunday morning, he woke up with SOB with some pressure. He did a stress test then, and was normal. They said it might have been anxiety. This current chest pressure and SOB reminds him of this event. He and his wife had colds a couple weeks ago. He has to take deeper breaths but denies any pain during deep inspiration. He's also felt some skipped beats in his pulse. He's been taking expectorant. He had asthma as a child and grew out of it. He denies headaches, wheezing.   He previously saw urology for BPH in [place] but they stopped seeing medicare patients.  He has done a sleep study for the sleep apnea and he's tried the mask during the night. He denies the sleep apnea, but he still has some snoring. He's been eating more fiber in his diet. He is still going to the gym consistently.    Past Medical History  Diagnosis Date  . Asthma     as a child  . Hyperlipidemia   . Seizures   . Prostatitis   . Balanitis   . Benign prostatic hypertrophy   . Cholecystitis with cholelithiasis   . Convulsive  disorder   . Hyperlipidemia   . OSA (obstructive sleep apnea)   . Arthritis    Current Outpatient Prescriptions on File Prior to Visit  Medication Sig Dispense Refill  . aspirin 81 MG tablet Take 81 mg by mouth daily.     . Levetiracetam (KEPPRA XR) 750 MG TB24 Take 1 tablet (750 mg total) by mouth daily. 90 tablet 3  . Multiple Vitamins-Minerals (MULTIVITAMIN WITH MINERALS) tablet Take 1 tablet by mouth daily.    . simvastatin (ZOCOR) 80 MG tablet TAKE 1/2 TABLET AT BEDTIME.  "OV NEEDED" 45 tablet 0  . zoster vaccine live, PF, (ZOSTAVAX) 94496 UNT/0.65ML injection Inject 19,400 Units into the skin once. 1 vial 0   No current facility-administered medications on file prior to visit.   Allergies  Allergen Reactions  . Penicillins Rash      Review of Systems  Constitutional: Positive for fatigue. Negative for fever and unexpected weight change.  HENT: Negative for congestion and sore throat.   Respiratory: Positive for chest tightness and shortness of breath. Negative for wheezing.   Gastrointestinal: Negative for nausea, vomiting, diarrhea and constipation.  Neurological: Negative for dizziness and light-headedness.       Objective:   Physical Exam  Constitutional:  He is oriented to person, place, and time. He appears well-developed and well-nourished. No distress.  HENT:  Head: Normocephalic and atraumatic.  Right Ear: Tympanic membrane normal.  Left Ear: A middle ear effusion is present.  Nose: Nose normal.  Mouth/Throat: Oropharynx is clear and moist. No oropharyngeal exudate.  Eyes: EOM are normal. Pupils are equal, round, and reactive to light.  Neck: Neck supple.  Cardiovascular: Normal rate, regular rhythm, S1 normal, S2 normal and normal heart sounds.   No murmur heard. Pulmonary/Chest: Effort normal and breath sounds normal. No respiratory distress. He has no wheezes.  Abdominal: Soft. Bowel sounds are normal. He exhibits no distension. There is no tenderness.    Musculoskeletal: Normal range of motion.  Neurological: He is alert and oriented to person, place, and time.  Skin: Skin is warm and dry.  Psychiatric: He has a normal mood and affect. His behavior is normal.  Nursing note and vitals reviewed.   BP 118/70 mmHg  Pulse 57  Temp(Src) 97.9 F (36.6 C) (Oral)  Resp 18  Ht 5\' 10"  (1.778 m)  Wt 175 lb (79.379 kg)  BMI 25.11 kg/m2  SpO2 99%  [facility] reading (PRIMARY) by Dr. Brigitte Pulse, EKG bradycardia, no ischemic changes, rate 47; spirometry normal, FVC and FEV 100% as predicted peak flow 526 Chest X-ray: cardio silhouette normal, has a pes excavatum noted on lateral.     Dg Chest 2 View  03/24/2015   CLINICAL DATA:  Substernal chest tightness. Shortness of breath and cough. History of asthma.  EXAM: CHEST  2 VIEW  COMPARISON:  None.  FINDINGS: The cardiomediastinal silhouette is within normal limits. Lucency in the upper mediastinum on the lateral image may reflect gas in the esophagus. The lungs are hyperinflated without evidence of confluent airspace opacity, edema, pleural effusion, or pneumothorax. Mild pectus excavatum deformity is noted.  IMPRESSION: Hyperinflation without evidence of acute airspace disease.   Electronically Signed   By: Logan Bores M.D.   On: 03/24/2015 13:00     Assessment & Plan:   1. Chest tightness - had neg cardiac stress test 10 yrs ago in [place] but sxs now recurrent and worsening. Atypical for angina but +FHx in the 49 caucasiona male so refer back to cards for repeat stress testing.  Pt unaware of his mild pectus excavatum - poss this could be causing the mild substernal pressure he has been experience though unlikely as this would have been life-long  2. Other fatigue - cons b12 though nml mcv/hgb, cons need for f/u w/ Dr. Gwenette Greet sleep medicine to see if mild CSA could be worsening  3. History of asthma - as a child, no conc for recurrence as fantastic spirometry  4. Dyspnea on exertion   5. Central sleep apnea   6.  Screening for prostate cancer   7. Screening for cardiovascular, respiratory, and genitourinary diseases   8. Routine screening for STI (sexually transmitted infection)   9. Nocturia   10. BPH (benign prostatic hyperplasia) - failed flomax prior but would be willing to retry or try other med if we recommend, has not seen urology in sev yrs. Likely he would be less fatigued if he could ever sleep for >4 hrs w/o wakening to void.  11. Bradycardia with 41 - 50 beats per minute - pt's pulse has been 50-70 for the past sev yrs but into 40s on EKG rhythm strip. Wonder if pt's fatigue might be symptoamtic of his bradycardia? He did feel a little better in  office today after alb trx which did increase his pulse some so refer to card to see if pt may benefit from a Holter monitor.  Pt has appt for Initial Medicare Wellness visit 9/15 with Kingsley Spittle so drew complete set of screening labs so pt can review at that time.  Pt does need flu shot and prevnar 13 at that visit.  If all labs neg/nml, would rec trial of a ppi despite atypical sxs for gerd but emperic trial would be very low risk.  Could also consider a few wk trial of simvastatin though pt denies myalgias but does have some more atypical side effects at 80mg  than some of the other statins.  Orders Placed This Encounter  Procedures  . DG Chest 2 View    Standing Status: Future     Number of Occurrences: 1     Standing Expiration Date: 03/23/2016    Order Specific Question:  Reason for Exam (SYMPTOM  OR DIAGNOSIS REQUIRED)    Answer:  substernal chest tightness    Order Specific Question:  Preferred imaging location?    Answer:  External  . Lipid panel    Order Specific Question:  Has the patient fasted?    Answer:  Yes  . Comprehensive metabolic panel    Order Specific Question:  Has the patient fasted?    Answer:  Yes  . TSH  . HIV antibody  . PSA  . Ambulatory referral to Cardiology    Referral Priority:  Routine    Referral Type:   Consultation    Referral Reason:  Specialty Services Required    Requested Specialty:  Cardiology    Number of Visits Requested:  1  . POCT CBC  . POCT SEDIMENTATION RATE  . EKG 12-Lead  . PFT PULM FXN SPIROMETRY (94010)    Meds ordered this encounter  Medications  . MAGNESIUM CITRATE PO    Sig: Take 200 mg by mouth daily.  Marland Kitchen albuterol (PROVENTIL) (2.5 MG/3ML) 0.083% nebulizer solution 2.5 mg    Sig:   . ipratropium (ATROVENT) nebulizer solution 0.5 mg    Sig:    Over 40 min spent in face-to-face evaluation of and consultation with patient and coordination of care.  Over 50% of this time was spent counseling this patient.  I personally performed the services described in this documentation, which was scribed in my presence. The recorded information has been reviewed and considered, and addended by me as needed.  Delman Cheadle, MD MPH

## 2015-03-25 ENCOUNTER — Encounter: Payer: Self-pay | Admitting: Family Medicine

## 2015-03-25 LAB — PSA: PSA: 0.9 ng/mL (ref <=4.00)

## 2015-03-28 ENCOUNTER — Encounter: Payer: Self-pay | Admitting: Urgent Care

## 2015-03-28 ENCOUNTER — Ambulatory Visit (INDEPENDENT_AMBULATORY_CARE_PROVIDER_SITE_OTHER): Payer: Medicare HMO | Admitting: Urgent Care

## 2015-03-28 VITALS — BP 115/71 | HR 56 | Temp 98.6°F | Resp 16 | Ht 68.5 in | Wt 173.6 lb

## 2015-03-28 DIAGNOSIS — Z23 Encounter for immunization: Secondary | ICD-10-CM | POA: Diagnosis not present

## 2015-03-28 DIAGNOSIS — Z85828 Personal history of other malignant neoplasm of skin: Secondary | ICD-10-CM | POA: Diagnosis not present

## 2015-03-28 DIAGNOSIS — R0602 Shortness of breath: Secondary | ICD-10-CM | POA: Diagnosis not present

## 2015-03-28 DIAGNOSIS — Z0000 Encounter for general adult medical examination without abnormal findings: Secondary | ICD-10-CM

## 2015-03-28 DIAGNOSIS — K219 Gastro-esophageal reflux disease without esophagitis: Secondary | ICD-10-CM

## 2015-03-28 DIAGNOSIS — L989 Disorder of the skin and subcutaneous tissue, unspecified: Secondary | ICD-10-CM

## 2015-03-28 DIAGNOSIS — Q676 Pectus excavatum: Secondary | ICD-10-CM

## 2015-03-28 DIAGNOSIS — Z9889 Other specified postprocedural states: Secondary | ICD-10-CM

## 2015-03-28 HISTORY — DX: Gastro-esophageal reflux disease without esophagitis: K21.9

## 2015-03-28 MED ORDER — ESOMEPRAZOLE MAGNESIUM 20 MG PO PACK: 20.0000 mg | PACK | Freq: Every day | ORAL | Status: DC

## 2015-03-28 NOTE — Progress Notes (Signed)
MRN: [id number] DOB: [birth year]  Subjective:   Anthony Juarez is a 69 y.o. male presenting for chief complaint of Annual Exam  PCP: Delman Cheadle MD Specialists: Dr. Ronalee Red, GI - Colonoscopy completed in 2012, 1 polyp and asymptomatic hemorrhoids found. EGD was also performed and found to have GERD.  Patient is retired, eats healthily and exercises regularly. He had a complete panel of labs drawn on 03/24/2015. All were normal including PSA, TSH, Cmet, Lipid, CBC.  Shortness of breath - patient has had intermittent shortness of breath over the summer. Has not tolerated exercise as well. Denies chest pain, heart racing, pnd, palpitations, dizziness, syncope, cough, n/v, abdominal pain.   HL - patient stopped taking simvastatin as he discussed this with Dr. Brigitte Pulse who saw him 03/24/2015.  Denies any other aggravating or relieving factors, no other questions or concerns.  Markise has a current medication list which includes the following prescription(s): aspirin, levetiracetam, magnesium citrate, multivitamin with minerals, simvastatin, and zoster vaccine live (pf). Also is allergic to penicillins.  Camaron  has a past medical history of Asthma; Hyperlipidemia; Seizures; Prostatitis; Balanitis; Benign prostatic hypertrophy; Cholecystitis with cholelithiasis; Convulsive disorder; Hyperlipidemia; OSA (obstructive sleep apnea); and Arthritis. Also  has past surgical history that includes Elbow surgery (11/11/2000); Tonsillectomy; and Fracture surgery.  Review of Systems  Constitutional: Negative for fever, chills, weight loss and diaphoresis.  HENT: Negative for ear discharge, ear pain, hearing loss, nosebleeds, sore throat and tinnitus.   Eyes: Negative for blurred vision, double vision, photophobia, pain, discharge and redness.  Respiratory: Negative for cough, shortness of breath and wheezing.   Cardiovascular: Negative for chest pain, palpitations and leg swelling.  Gastrointestinal: Negative  for nausea, vomiting, abdominal pain, diarrhea, constipation and blood in stool.  Genitourinary: Negative for dysuria, urgency, hematuria and flank pain.  Musculoskeletal: Negative for myalgias, back pain and joint pain.  Skin: Negative for itching and rash.  Neurological: Negative for dizziness, tingling, seizures, loss of consciousness, weakness and headaches.  Endo/Heme/Allergies: Negative for polydipsia.  Psychiatric/Behavioral: Negative for depression, suicidal ideas, hallucinations, memory loss and substance abuse. The patient is not nervous/anxious and does not have insomnia.    Objective:   Vitals: BP 115/71 mmHg  Pulse 56  Temp(Src) 98.6 F (37 C) (Oral)  Resp 16  Ht 5' 8.5" (1.74 m)  Wt 173 lb 9.6 oz (78.744 kg)  BMI 26.01 kg/m2  Physical Exam  Constitutional: He is oriented to person, place, and time. He appears well-developed and well-nourished.  HENT:  Head:    TM's intact bilaterally, no effusions or erythema. Nares patent, nasal turbinates pink and moist, nasal passages patent. No sinus tenderness. Oropharynx clear, mucous membranes moist, dentition in good repair.  Eyes: Conjunctivae and EOM are normal. Pupils are equal, round, and reactive to light. Right eye exhibits no discharge. Left eye exhibits no discharge. No scleral icterus.  Neck: Normal range of motion. Neck supple. No thyromegaly present.  Cardiovascular: Normal rate, regular rhythm and intact distal pulses.  Exam reveals no gallop and no friction rub.   No murmur heard. Pulmonary/Chest: No stridor. No respiratory distress. He has no wheezes. He has no rales. He exhibits no mass and no bony tenderness.    Abdominal: Soft. Bowel sounds are normal. He exhibits no distension and no mass. There is no tenderness.  Genitourinary:  Patient declined.  Musculoskeletal: Normal range of motion. He exhibits no edema or tenderness.  Lymphadenopathy:    He has no cervical adenopathy.  Neurological: He is alert  and oriented to person, place, and time.  Skin: Skin is warm and dry. No rash noted. No erythema. No pallor.  Psychiatric: He has a normal mood and affect.   Assessment and Plan :   I conducted my visit, face to face time between 14:43 - 15:09.  1. Medicare annual wellness visit, subsequent - Patient is medically healthy - Discussed healthy lifestyle, diet, exercise, preventative care, vaccinations, and addressed patient's concerns.  - Labs reviewed.  2. Shortness of breath 3. Pectus excavatum - Unclear etiology, referral to cardiology pending. Patient stopped his simvastatin after discussion with Dr. Brigitte Pulse of this medication as a possible factor. Given his significantly improved lipid panel I expect patient will continue to do well without this medication and it is important to rule this out as contributing factor to his shortness of breath. - There may be an anatomical component due to his pectus excavatum. This is known to cause difficulty with shortness of breath and can usually cause an elevated heart rate but is not the case with this patient and therefore we'll look to have cardiology evaluate him as well. A CT is indicated to rule out pectus excavatum as cause of shortness of breath but I will let the cardiologist make this decision.  4. Gastroesophageal reflux disease, esophagitis presence not specified - Patient agreed to trial of Nexium given his history of reflux and is an effort to rule this out as a cause of her shortness of breath.  5. Need for prophylactic vaccination against Streptococcus pneumoniae (pneumococcus) - Pneumococcal conjugate vaccine 13-valent IM  6. Need for prophylactic vaccination and inoculation against influenza - Flu Vaccine QUAD 36+ mos IM  7. Skin lesion of face 8. History of basal cell carcinoma excision - Likely benign but given patient's history will refer to Dr. Allyson Sabal for further evaluation. I declined to do a biopsy given that it is a skin  lesion over his face. - Ambulatory referral to Dermatology   Jaynee Eagles, PA-C [facility] [phone] 03/28/2015 1:09 PM

## 2015-03-28 NOTE — Patient Instructions (Signed)
Esomeprazole capsules What is this medicine? ESOMEPRAZOLE (es oh ME pray zol) prevents the production of acid in the stomach. It is used to treat gastroesophageal reflux disease (GERD), ulcers, certain bacteria in the stomach, and inflammation of the esophagus. It can also be used to prevent ulcers in patients taking medicines called NSAIDs. You can also buy this medicine without a prescription to treat the symptoms of heartburn. The non-prescription product is not for long-term use, unless otherwise directed by your doctor or health care professional. This medicine may be used for other purposes; ask your health care provider or pharmacist if you have questions. COMMON BRAND NAME(S): Nexium, Nexium 24HR What should I tell my health care provider before I take this medicine? They need to know if you have any of these conditions: -bloody or black, tarry stools -chest pain -have had heartburn for over 3 months -have heartburn with dizziness, lightheadedness, or sweating -liver disease -low levels of magnesium in the blood -nausea, vomiting -stomach pain -trouble swallowing -unexplained weight loss -vomiting with blood -wheezing -an unusual or allergic reaction to esomeprazole, other medicines, foods, dyes, or preservatives -pregnant or trying to get pregnant -breast-feeding How should I use this medicine? Take this medicine by mouth. Swallow the capsules whole with a drink of water. Follow the directions on the prescription or product label. Do not crush, break or chew. The capsules can be opened and the contents sprinkled on applesauce. Do not crush the contents into the food. This medicine works best if taken on an empty stomach at least one hour before a meal. Take your medicine at regular intervals. Do not take your medicine more often than directed. Talk to your pediatrician regarding the use of this medicine in children. Special care may be needed. Overdosage: If you think you have  taken too much of this medicine contact a poison control center or emergency room at once. NOTE: This medicine is only for you. Do not share this medicine with others. What if I miss a dose? If you miss a dose, take it as soon as you can. If it is almost time for your next dose, take only that dose. Do not take double or extra doses. What may interact with this medicine? Do not take this medicine with any of the following medications: -atazanavir This medicine may also interact with the following medications: -ampicillin -antiviral medicines for HIV or AIDS -certain medicines for fungal infections like ketoconazole and itraconazole -cilostazol -clopidogrel -diazepam -digoxin -erlotinib -diuretics -iron salts -methotrexate -St. John's Wort -tacrolimus -rifampin -warfarin This list may not describe all possible interactions. Give your health care provider a list of all the medicines, herbs, non-prescription drugs, or dietary supplements you use. Also tell them if you smoke, drink alcohol, or use illegal drugs. Some items may interact with your medicine. What should I watch for while using this medicine? If you are taking this medicine without a prescription, it may take 1 to 4 days for it to fully relieve your heartburn. If you are using this medicine with a prescription from your healthcare professional for a more serious condition, it can take several days before your condition gets better. Check with your doctor or health care professional if your condition does not start to get better, or if it gets worse. If you take this medicine for long periods of time, you may need blood work done. What side effects may I notice from receiving this medicine? Side effects that you should report to your doctor or health  care professional as soon as possible: -allergic reactions like skin rash, itching or hives, swelling of the face, lips, or tongue -bone, muscle or joint pain -breathing  problems -chest pain or chest tightness -dark yellow or brown urine -fast, irregular heartbeat -feeling faint or lightheaded -fever or sore throat -muscle spasms -tremors -unusual bleeding or bruising -unusually weak or tired -upset stomach -yellowing of the eyes or skin Side effects that usually do not require medical attention (Report these to your doctor or health care professional if they continue or are bothersome.): -constipation -diarrhea -dry mouth -headache -nausea -stomach pain or gas -vomiting This list may not describe all possible side effects. Call your doctor for medical advice about side effects. You may report side effects to FDA at 1-800-FDA-1088. Where should I keep my medicine? Keep out of the reach of children. Store at room temperature between 15 and 30 degrees C (59 and 86 degrees F). Protect from light and moisture. Throw away any unused medicine after the expiration date. NOTE: This sheet is a summary. It may not cover all possible information. If you have questions about this medicine, talk to your doctor, pharmacist, or health care provider.  2015, Elsevier/Gold Standard. (2012-10-11 14:58:20)    Pneumococcal Vaccine, Polyvalent suspension for injection What is this medicine? PNEUMOCOCCAL VACCINE, POLYVALENT (NEU mo KOK al vak SEEN, pol ee VEY luhnt) is a vaccine to prevent pneumococcus bacteria infection. These bacteria are a major cause of ear infections, 'Strep throat' infections, and serious pneumonia, meningitis, or blood infections worldwide. These vaccines help the body to produce antibodies (protective substances) that help your body defend against these bacteria. This vaccine is recommended for infants and young children. This vaccine will not treat an infection. This medicine may be used for other purposes; ask your health care provider or pharmacist if you have questions. COMMON BRAND NAME(S): Prevnar 13 What should I tell my health care  provider before I take this medicine? They need to know if you have any of these conditions: -bleeding problems -fever -immune system problems -low platelet count in the blood -seizures -an unusual or allergic reaction to pneumococcal vaccine, diphtheria toxoid, other vaccines, latex, other medicines, foods, dyes, or preservatives -pregnant or trying to get pregnant -breast-feeding How should I use this medicine? This vaccine is for injection into a muscle. It is given by a health care professional. A copy of Vaccine Information Statements will be given before each vaccination. Read this sheet carefully each time. The sheet may change frequently. Talk to your pediatrician regarding the use of this medicine in children. While this drug may be prescribed for children as young as 36 weeks old for selected conditions, precautions do apply. Overdosage: If you think you have taken too much of this medicine contact a poison control center or emergency room at once. NOTE: This medicine is only for you. Do not share this medicine with others. What if I miss a dose? It is important not to miss your dose. Call your doctor or health care professional if you are unable to keep an appointment. What may interact with this medicine? -medicines for cancer chemotherapy -medicines that suppress your immune function -medicines that treat or prevent blood clots like warfarin, enoxaparin, and dalteparin -steroid medicines like prednisone or cortisone This list may not describe all possible interactions. Give your health care provider a list of all the medicines, herbs, non-prescription drugs, or dietary supplements you use. Also tell them if you smoke, drink alcohol, or use illegal drugs. Some  items may interact with your medicine. What should I watch for while using this medicine? Mild fever and pain should go away in 3 days or less. Report any unusual symptoms to your doctor or health care professional. What  side effects may I notice from receiving this medicine? Side effects that you should report to your doctor or health care professional as soon as possible: -allergic reactions like skin rash, itching or hives, swelling of the face, lips, or tongue -breathing problems -confused -fever over 102 degrees F -pain, tingling, numbness in the hands or feet -seizures -unusual bleeding or bruising -unusual muscle weakness Side effects that usually do not require medical attention (report to your doctor or health care professional if they continue or are bothersome): -aches and pains -diarrhea -fever of 102 degrees F or less -headache -irritable -loss of appetite -pain, tender at site where injected -trouble sleeping This list may not describe all possible side effects. Call your doctor for medical advice about side effects. You may report side effects to FDA at 1-800-FDA-1088. Where should I keep my medicine? This does not apply. This vaccine is given in a clinic, pharmacy, doctor's office, or other health care setting and will not be stored at home. NOTE: This sheet is a summary. It may not cover all possible information. If you have questions about this medicine, talk to your doctor, pharmacist, or health care provider.  2015, Elsevier/Gold Standard. (2008-09-11 10:17:22)   Influenza Virus Vaccine injection What is this medicine? INFLUENZA VIRUS VACCINE (in floo EN zuh VAHY ruhs vak SEEN) helps to reduce the risk of getting influenza also known as the flu. The vaccine only helps protect you against some strains of the flu. This medicine may be used for other purposes; ask your health care provider or pharmacist if you have questions. COMMON BRAND NAME(S): Afluria, Agriflu, Fluarix, Fluarix Quadrivalent, FLUCELVAX, Flulaval, Fluvirin, Fluzone, Fluzone High-Dose, Fluzone Intradermal What should I tell my health care provider before I take this medicine? They need to know if you have any of  these conditions: -bleeding disorder like hemophilia -fever or infection -Guillain-Barre syndrome or other neurological problems -immune system problems -infection with the human immunodeficiency virus (HIV) or AIDS -low blood platelet counts -multiple sclerosis -an unusual or allergic reaction to influenza virus vaccine, latex, other medicines, foods, dyes, or preservatives. Different brands of vaccines contain different allergens. Some may contain latex or eggs. Talk to your doctor about your allergies to make sure that you get the right vaccine. -pregnant or trying to get pregnant -breast-feeding How should I use this medicine? This vaccine is for injection into a muscle or under the skin. It is given by a health care professional. A copy of Vaccine Information Statements will be given before each vaccination. Read this sheet carefully each time. The sheet may change frequently. Talk to your healthcare provider to see which vaccines are right for you. Some vaccines should not be used in all age groups. Overdosage: If you think you have taken too much of this medicine contact a poison control center or emergency room at once. NOTE: This medicine is only for you. Do not share this medicine with others. What if I miss a dose? This does not apply. What may interact with this medicine? -chemotherapy or radiation therapy -medicines that lower your immune system like etanercept, anakinra, infliximab, and adalimumab -medicines that treat or prevent blood clots like warfarin -phenytoin -steroid medicines like prednisone or cortisone -theophylline -vaccines This list may not describe all possible  interactions. Give your health care provider a list of all the medicines, herbs, non-prescription drugs, or dietary supplements you use. Also tell them if you smoke, drink alcohol, or use illegal drugs. Some items may interact with your medicine. What should I watch for while using this  medicine? Report any side effects that do not go away within 3 days to your doctor or health care professional. Call your health care provider if any unusual symptoms occur within 6 weeks of receiving this vaccine. You may still catch the flu, but the illness is not usually as bad. You cannot get the flu from the vaccine. The vaccine will not protect against colds or other illnesses that may cause fever. The vaccine is needed every year. What side effects may I notice from receiving this medicine? Side effects that you should report to your doctor or health care professional as soon as possible: -allergic reactions like skin rash, itching or hives, swelling of the face, lips, or tongue Side effects that usually do not require medical attention (report to your doctor or health care professional if they continue or are bothersome): -fever -headache -muscle aches and pains -pain, tenderness, redness, or swelling at the injection site -tiredness This list may not describe all possible side effects. Call your doctor for medical advice about side effects. You may report side effects to FDA at 1-800-FDA-1088. Where should I keep my medicine? The vaccine will be given by a health care professional in a clinic, pharmacy, doctor's office, or other health care setting. You will not be given vaccine doses to store at home. NOTE: This sheet is a summary. It may not cover all possible information. If you have questions about this medicine, talk to your doctor, pharmacist, or health care provider.  2015, Elsevier/Gold Standard. (2012-01-07 41:93:79)

## 2015-04-01 ENCOUNTER — Telehealth: Payer: Self-pay

## 2015-04-01 ENCOUNTER — Encounter: Payer: Self-pay | Admitting: Family Medicine

## 2015-04-01 NOTE — Telephone Encounter (Signed)
Note    Sent to [facility] work queue      From  Hulan Saas   To  Shawnee Knapp, MD   Sent  04/01/2015 2:16 PM      Still looking for a referral to Dr. Einar Gip. In the works, I'm sure.   Thanks      Is Dr. Einar Gip at this location?

## 2015-04-30 ENCOUNTER — Encounter: Payer: Self-pay | Admitting: Cardiology

## 2015-04-30 ENCOUNTER — Ambulatory Visit (INDEPENDENT_AMBULATORY_CARE_PROVIDER_SITE_OTHER): Payer: Medicare HMO | Admitting: Cardiology

## 2015-04-30 VITALS — BP 118/78 | HR 59 | Ht 69.0 in | Wt 176.2 lb

## 2015-04-30 DIAGNOSIS — R0600 Dyspnea, unspecified: Secondary | ICD-10-CM

## 2015-04-30 DIAGNOSIS — Q676 Pectus excavatum: Secondary | ICD-10-CM | POA: Diagnosis not present

## 2015-04-30 DIAGNOSIS — I951 Orthostatic hypotension: Secondary | ICD-10-CM | POA: Diagnosis not present

## 2015-04-30 DIAGNOSIS — R0602 Shortness of breath: Secondary | ICD-10-CM

## 2015-04-30 NOTE — Patient Instructions (Signed)
Medication Instructions:  The current medical regimen is effective;  continue present plan and medications.  Testing/Procedures: Your physician has requested that you have an exercise tolerance test. For further information please visit [email or web address]. Please also follow instruction sheet, as given.  Your physician has requested that you have an echocardiogram. Echocardiography is a painless test that uses sound waves to create images of your heart. It provides your doctor with information about the size and shape of your heart and how well your heart's chambers and valves are working. This procedure takes approximately one hour. There are no restrictions for this procedure.  Follow-Up: Follow up as needed after testing.  Thank you for choosing [facility]!!

## 2015-04-30 NOTE — Progress Notes (Signed)
Cardiology Office Note   Date:  04/30/2015   ID:  Anthony Juarez, DOB Nov 09, 1945, MRN [id number]  PCP:  Delman Cheadle, MD  Cardiologist:   Candee Furbish, MD     History of Present Illness: Anthony Juarez is a 69 y.o. male who Anthony Juarez) presents for evaluation of shortness of breath which he has had off and on during the summer. Denies any specific chest discomfort. No palpitations syncope, nausea. He also has a diagnosis of pectus excavatum.   There was concern that this anatomical component of pectus excavatum may be causing some difficulty with shortness of breath and also some elevated heart rate. CT scan was not ordered but entertained at the time of prior evaluation.  Back in [place] 10 years ago, went boating and next morining felt difficulty breathing. ?MSK. Anthony Juarez wife. Took to hospital. Admit, stress test normal, ECHO normal except for slightly leaky valve.  Periodically has a missed beat felt. Few times wake up and feel like he has low BP. BPH, up at 530am. Felt tachycardia. HR 119, BP 90/60. Pale as well.   Father CABG 37's. PFT's were normal.   Past Medical History  Diagnosis Date  . Hyperlipidemia   . Seizures ([place])   . Prostatitis   . Balanitis   . Benign prostatic hypertrophy   . Cholecystitis with cholelithiasis   . Convulsive disorder ([place])   . Hyperlipidemia   . OSA (obstructive sleep apnea)   . Arthritis     Past Surgical History  Procedure Laterality Date  . Elbow surgery  11/11/2000    repair  . Tonsillectomy    . Fracture surgery      LEFT ELBOW     Current Outpatient Prescriptions  Medication Sig Dispense Refill  . aspirin 81 MG tablet Take 81 mg by mouth daily.     Marland Kitchen esomeprazole (NEXIUM) 20 MG packet Take 20 mg by mouth daily before breakfast. 30 each 12  . Levetiracetam (KEPPRA XR) 750 MG TB24 Take 1 tablet (750 mg total) by mouth daily. 90 tablet 3  . MAGNESIUM CITRATE PO Take 200 mg by mouth daily.    . Multiple Vitamins-Minerals  (MULTIVITAMIN WITH MINERALS) tablet Take 1 tablet by mouth daily.    Marland Kitchen zoster vaccine live, PF, (ZOSTAVAX) 47829 UNT/0.65ML injection Inject 19,400 Units into the skin once. 1 vial 0   No current facility-administered medications for this visit.    Allergies:   Penicillins    Social History:  The patient  reports that he quit smoking about 37 years ago. He has never used smokeless tobacco. He reports that he drinks about 1.2 oz of alcohol per week. He reports that he does not use illicit drugs.   Family History:  The patient's family history includes Cancer in his father; Heart disease in his father and maternal grandfather.    ROS:  Please see the history of present illness.   Otherwise, review of systems are positive for none.   All other systems are reviewed and negative.    PHYSICAL EXAM: VS:  BP 118/78 mmHg  Pulse 59  Ht 5\' 9"  (1.753 m)  Wt 176 lb 3.2 oz (79.924 kg)  BMI 26.01 kg/m2  SpO2 98% , BMI Body mass index is 26.01 kg/(m^2). GEN: Well nourished, well developed, in no acute distress HEENT: normal Neck: no JVD, carotid bruits, or masses Cardiac: RRR; no murmurs, rubs, or gallops,no edema, mild pectus excavatum Respiratory:  clear to auscultation bilaterally, normal work of breathing GI: soft,  nontender, nondistended, + BS MS: no deformity or atrophy Skin: warm and dry, no rash Neuro:  Strength and sensation are intact Psych: euthymic mood, full affect   EKG:   Previous EKG reviewed shows sinus bradycardia , no other abnormalities, personally reviewed   Chest x-ray 03/24/15-personally reviewed shows mild pectus excavatum , otherwise normal.   Prior PFTs are normal no evidence of restriction according to patient.   2004 echo showed "mild leakiness of valve ". Nuclear stress test was normal.   Recent Labs: 03/24/2015: ALT 10; BUN 13; Creat 0.97; Hemoglobin 14.2; Potassium 4.7; Sodium 139; TSH 1.543    Lipid Panel    Component Value Date/Time   CHOL 175  03/24/2015 1103   TRIG 61 03/24/2015 1103   HDL 83 03/24/2015 1103   CHOLHDL 2.1 03/24/2015 1103   VLDL 12 03/24/2015 1103   LDLCALC 80 03/24/2015 1103      Wt Readings from Last 3 Encounters:  04/30/15 176 lb 3.2 oz (79.924 kg)  03/28/15 173 lb 9.6 oz (78.744 kg)  03/24/15 175 lb (79.379 kg)      Other studies Reviewed: Additional studies/ records that were reviewed today include:  Prior medical records reviewed, office notes reviewed , lab work, chest x-ray personally reviewed, EKG reviewed. Review of the above records demonstrates:  As above   ASSESSMENT AND PLAN:  1.   dyspnea-intermittent -I will check an echocardiogram to ensure proper structure and function of his heart, previously diagnosed with a mildly leaky valve E states. We will also check an exercise treadmill test. His father had bypass in his 33s. Previous nuclear stress test over 10 years ago was normal.   2. Pectus excavatum-very mild on chest x-ray lateral view noted. I do not feel strongly that a CT scan is necessary. Prior PFTs were normal showing no evidence of restriction. Excellent. This should be of no significant clinical consequence. Occasionally he may have some mild musculoskeletal discomfort due to this abnormality which he has experienced in the past after heavy rowing for instance. Echo is being checked.   3. Orthostatic hypotension- at 5 in the morning , getting up secondary to BPH has had an experience of low blood pressure in the 90s, pulse elevated in the 100 range. This is likely secondary to orthostatic hypotension. Asked him to be careful when getting out of bed. He is on no blood pressure medication. His blood pressure is usually on the low side. As he continues to age, this may continue to get worse. Careful getting out of bed once again.   4. Family history of CAD-father bypass 43.   Current medicines are reviewed at length with the patient today.  The patient does not have concerns regarding  medicines.  The following changes have been made:  no change  Labs/ tests ordered today include:   Orders Placed This Encounter  Procedures  . Exercise Tolerance Test  . Echocardiogram     Disposition:   FU as needed.  We will review results of studies.  Bobby Rumpf, MD  04/30/2015 8:55 AM    [facility] [address], [place], [place]  [address] Phone: [phone]; Fax: [phone]

## 2015-05-06 ENCOUNTER — Ambulatory Visit: Payer: Medicare HMO | Admitting: Cardiology

## 2015-05-22 ENCOUNTER — Ambulatory Visit (HOSPITAL_COMMUNITY): Payer: Medicare HMO | Attending: Cardiology

## 2015-05-22 ENCOUNTER — Other Ambulatory Visit: Payer: Self-pay

## 2015-05-22 ENCOUNTER — Telehealth (HOSPITAL_COMMUNITY): Payer: Self-pay

## 2015-05-22 ENCOUNTER — Ambulatory Visit (INDEPENDENT_AMBULATORY_CARE_PROVIDER_SITE_OTHER): Payer: Medicare HMO

## 2015-05-22 ENCOUNTER — Encounter: Payer: Medicare HMO | Admitting: Physician Assistant

## 2015-05-22 ENCOUNTER — Other Ambulatory Visit: Payer: Self-pay | Admitting: Physician Assistant

## 2015-05-22 DIAGNOSIS — Z87891 Personal history of nicotine dependence: Secondary | ICD-10-CM | POA: Insufficient documentation

## 2015-05-22 DIAGNOSIS — E785 Hyperlipidemia, unspecified: Secondary | ICD-10-CM | POA: Insufficient documentation

## 2015-05-22 DIAGNOSIS — G4733 Obstructive sleep apnea (adult) (pediatric): Secondary | ICD-10-CM | POA: Insufficient documentation

## 2015-05-22 DIAGNOSIS — I340 Nonrheumatic mitral (valve) insufficiency: Secondary | ICD-10-CM | POA: Diagnosis not present

## 2015-05-22 DIAGNOSIS — R0600 Dyspnea, unspecified: Secondary | ICD-10-CM | POA: Insufficient documentation

## 2015-05-22 DIAGNOSIS — I313 Pericardial effusion (noninflammatory): Secondary | ICD-10-CM | POA: Insufficient documentation

## 2015-05-22 DIAGNOSIS — Z8249 Family history of ischemic heart disease and other diseases of the circulatory system: Secondary | ICD-10-CM | POA: Diagnosis not present

## 2015-05-22 DIAGNOSIS — R9439 Abnormal result of other cardiovascular function study: Secondary | ICD-10-CM

## 2015-05-22 DIAGNOSIS — R0602 Shortness of breath: Secondary | ICD-10-CM

## 2015-05-22 LAB — EXERCISE TOLERANCE TEST
CSEPED: 10 min
CSEPPHR: 134 {beats}/min
Estimated workload: 11.7 METS
MPHR: 153 {beats}/min
Percent HR: 87 %
RPE: 16
Rest HR: 49 {beats}/min

## 2015-05-22 NOTE — Telephone Encounter (Signed)
Left message on voicemail in reference to upcoming appointment scheduled for 05-28-2015. Phone number given for Juarez call back so details instructions can be given. Anthony Juarez, Anthony Juarez

## 2015-05-23 ENCOUNTER — Telehealth (HOSPITAL_COMMUNITY): Payer: Self-pay | Admitting: *Deleted

## 2015-05-23 NOTE — Telephone Encounter (Signed)
Patient given detailed instructions per Myocardial Perfusion Study Information Sheet for the test on 05/28/15 at 0730. Patient notified to arrive 15 minutes early and that it is imperative to arrive on time for appointment to keep from having the test rescheduled.  If you need to cancel or reschedule your appointment, please call the office within 24 hours of your appointment. Failure to do so may result in a cancellation of your appointment, and a $50 no show fee. Patient verbalized understanding.Wylie Coon, Ranae Palms

## 2015-05-28 ENCOUNTER — Ambulatory Visit (HOSPITAL_COMMUNITY): Payer: Medicare HMO | Attending: Cardiology

## 2015-05-28 DIAGNOSIS — R002 Palpitations: Secondary | ICD-10-CM | POA: Diagnosis not present

## 2015-05-28 DIAGNOSIS — R0609 Other forms of dyspnea: Secondary | ICD-10-CM | POA: Diagnosis not present

## 2015-05-28 DIAGNOSIS — R0602 Shortness of breath: Secondary | ICD-10-CM | POA: Diagnosis not present

## 2015-05-28 DIAGNOSIS — R9439 Abnormal result of other cardiovascular function study: Secondary | ICD-10-CM | POA: Diagnosis not present

## 2015-05-28 DIAGNOSIS — Z87891 Personal history of nicotine dependence: Secondary | ICD-10-CM | POA: Insufficient documentation

## 2015-05-28 LAB — MYOCARDIAL PERFUSION IMAGING
CHL CUP NUCLEAR SSS: 0
CSEPPHR: 81 {beats}/min
LV dias vol: 111 mL
LVSYSVOL: 44 mL
NUC STRESS TID: 0.98
RATE: 0.24
Rest HR: 47 {beats}/min
SDS: 0
SRS: 0

## 2015-05-28 MED ORDER — TECHNETIUM TC 99M SESTAMIBI GENERIC - CARDIOLITE
11.0000 | Freq: Once | INTRAVENOUS | Status: AC | PRN
  Administered 2015-05-28: 11 via INTRAVENOUS

## 2015-05-28 MED ORDER — REGADENOSON 0.4 MG/5ML IV SOLN
0.4000 mg | Freq: Once | INTRAVENOUS | Status: AC
  Administered 2015-05-28: 0.4 mg via INTRAVENOUS

## 2015-05-28 MED ORDER — TECHNETIUM TC 99M SESTAMIBI GENERIC - CARDIOLITE
32.5000 | Freq: Once | INTRAVENOUS | Status: AC | PRN
  Administered 2015-05-28: 33 via INTRAVENOUS

## 2015-05-29 ENCOUNTER — Encounter: Payer: Self-pay | Admitting: Physician Assistant

## 2015-06-27 ENCOUNTER — Other Ambulatory Visit: Payer: Self-pay | Admitting: Neurology

## 2015-06-27 DIAGNOSIS — Q282 Arteriovenous malformation of cerebral vessels: Secondary | ICD-10-CM

## 2015-06-27 DIAGNOSIS — G40909 Epilepsy, unspecified, not intractable, without status epilepticus: Secondary | ICD-10-CM

## 2015-07-22 ENCOUNTER — Inpatient Hospital Stay: Admission: RE | Admit: 2015-07-22 | Payer: Medicare HMO | Source: Ambulatory Visit

## 2015-07-29 ENCOUNTER — Ambulatory Visit
Admission: RE | Admit: 2015-07-29 | Discharge: 2015-07-29 | Disposition: A | Discharge status: Home / Self Care | Payer: Medicare Other | Source: Ambulatory Visit | Attending: Neurology | Admitting: Neurology

## 2015-07-29 DIAGNOSIS — G40909 Epilepsy, unspecified, not intractable, without status epilepticus: Secondary | ICD-10-CM | POA: Diagnosis not present

## 2015-07-29 DIAGNOSIS — Q282 Arteriovenous malformation of cerebral vessels: Secondary | ICD-10-CM

## 2015-07-29 DIAGNOSIS — Q283 Other malformations of cerebral vessels: Secondary | ICD-10-CM | POA: Diagnosis not present

## 2015-07-29 MED ORDER — GADOBENATE DIMEGLUMINE 529 MG/ML IV SOLN
15.0000 mL | Freq: Once | INTRAVENOUS | Status: AC | PRN
  Administered 2015-07-29: 15 mL via INTRAVENOUS

## 2015-07-30 ENCOUNTER — Telehealth: Payer: Self-pay | Admitting: Neurology

## 2015-07-30 NOTE — Telephone Encounter (Signed)
Patient aware of results - he will keep his follow up appt for further discussion.

## 2015-07-30 NOTE — Telephone Encounter (Signed)
Please call patient, MRI of the brain showed right frontal superficial cavernous malformation, no definite change compared to previous scan in January 2015, in addition, there are scattered deep white matter small vessel disease, which is also stable   IMPRESSION: This MRI of the brain with and without contrast shows the following: 1. A focus of heterogenous signal in the superficial right frontal lobe with a hemosiderin rim and adjacent venous anomaly. This has characteristics of a cavernous malformation. Compared to the 07/20/2013 MRI, there has been no definite change. 2. Scattered T2/FLAIR hyperintense foci, predominantly in the subcortical deep white matter of both hemispheres. This is stable compared to the prior MRI. She is a nonspecific finding and could [facility] due to chronic microvascular ischemic change, migraine, chronic demyelination. None of the foci appears to be acute.

## 2015-07-31 ENCOUNTER — Other Ambulatory Visit: Payer: Self-pay | Admitting: Neurology

## 2015-08-01 ENCOUNTER — Encounter: Payer: Self-pay | Admitting: Neurology

## 2015-08-01 ENCOUNTER — Ambulatory Visit (INDEPENDENT_AMBULATORY_CARE_PROVIDER_SITE_OTHER): Payer: Medicare Other | Admitting: Neurology

## 2015-08-01 VITALS — BP 147/86 | HR 57 | Ht 69.0 in | Wt 176.0 lb

## 2015-08-01 DIAGNOSIS — G40909 Epilepsy, unspecified, not intractable, without status epilepticus: Secondary | ICD-10-CM

## 2015-08-01 DIAGNOSIS — Q282 Arteriovenous malformation of cerebral vessels: Secondary | ICD-10-CM

## 2015-08-01 DIAGNOSIS — Q283 Other malformations of cerebral vessels: Secondary | ICD-10-CM | POA: Diagnosis not present

## 2015-08-01 MED ORDER — LEVETIRACETAM ER 750 MG PO TB24: 750.0000 mg | ORAL_TABLET | Freq: Every day | ORAL | Status: DC

## 2015-08-01 NOTE — Progress Notes (Signed)
PATIENT: Anthony Juarez DOB: Jun 18, 1946  Chief Complaint  Patient presents with  . Seizures    He is here with his wife, Anthony Juarez, for his yearly follow up.  He is doing well on his current medication.  Reports no seizure activity.  He would like to review his MRI.     HISTORICAL  Anthony Juarez, seen in refer by   HPI:   Anthony Juarez is a 70 yo WM, following up for seizure disorder  He has PMHx of seizure since infant, he was treated with dilantin and phenobarbital for a long time, was eventurally tapered off after seizure free since age 35, he had one recurrent seizure at age 82, was put back on dilantin 400mg  qhs, has been on it since.  He had Bachelor's degree, retired as Location manager, Insurance underwriter  in 2009, moved to [place] about 2.5 years ago, now he stays at home most of time, rarely initiated activities, felt fatigue, difficulty concontrating, getting worse since summer of 2013.  His wife complains that he is not up to his house projects anymore, frequent nocturia, which has prompted recent treatment with desmopression, which helped his frequent night time awaking,  He also has sleep apnea, but could not tolerate his BiPAP machine.He is seen by Dr. Gwenette Greet.  Dilantin level was 32 while he was taking Dilantin 100 mg 4 tablets each night, level decreased to 22 while he was taking Dilantin 100 mg 3 tablets each night, he has developed gingival hypertrophy, his brain fogginess has much improved with decreased dose of Dilantin, especially after he stopped taking Desopressin,   MRI scan of the brain showed a cavernous angioma with remote age hemorrhage in the right frontal subcortical region with  and adjacent  venous angioma.  There are moderate changes of chronic microvascular ischemia and mild degree of generalized cerebral atrophy.  Followup visit today patient has been switched to Keppra XR 750mg  daily. He is off Dilantin totally and his brain fogginess has  improved. He has not had further seizure activity.  UPDATE Jan 19th 2015: He is now taking keppra xr 750mg  qhs, last seizure 1992, nocturnal seizure,  he only has mild fatigue, no longer having brain foggy sensation We have reviewed MRI taking January 2015, continue the right inferior frontal cavernous venous angioma, mild atrophy, moderate small vessel disease,  UPDATE Jan 19th 2016; I have reviewed MRI brain (with and without): right frontal juxtacortical T2 heterogenous lesion (1.4x1.0cm), with "popcorn" appearance, consistent with cavernous malformation. There is an associated small developmental venous anomaly.  Multiple round and ovoid, periventricular, subcortical and juxtacortical T2 hyperintense foci. These findings are non-specific and considerations include autoimmune, inflammatory, post-infectious, microvascular ischemic or migraine associated etiologies.   He is taking Keppra XR 750 mg every night, there was no recurrent seizure  UPDATE Aug 01 2015: He had sleep study recently,  No need for CPAP machine, he is taking keppra xr 750mg  qhs, no recurrent seizure.   We have reviewed MRI of the brain with and without contrast in January 2017: 1. A focus of heterogenous signal in the superficial right frontal lobe with a hemosiderin rim and adjacent venous anomaly. This has characteristics of a cavernous malformation. Compared to the 07/20/2013 MRI, there has been no definite change. 2. Scattered T2/FLAIR hyperintense foci, predominantly in the subcortical deep white matter of both hemispheres. This is stable compared to the prior MRI. She is a nonspecific finding and could [facility] due to chronic microvascular ischemic  change, migraine, chronic demyelination. None of the foci appears to be acute.  REVIEW OF SYSTEMS: Full 14 system review of systems performed and notable only for as above  ALLERGIES: Allergies  Allergen Reactions  . Penicillins Rash    HOME  MEDICATIONS: Current Outpatient Prescriptions  Medication Sig Dispense Refill  . aspirin 81 MG tablet Take 81 mg by mouth daily.     Marland Kitchen esomeprazole (NEXIUM) 20 MG packet Take 20 mg by mouth daily before breakfast. 30 each 12  . Levetiracetam 750 MG TB24 Take 1 tablet (750 mg total) by mouth daily. 90 tablet 3  . MAGNESIUM CITRATE PO Take 150 mg by mouth daily.     . Multiple Vitamins-Minerals (MULTIVITAMIN WITH MINERALS) tablet Take 1 tablet by mouth daily.     No current facility-administered medications for this visit.    PAST MEDICAL HISTORY: Past Medical History  Diagnosis Date  . Hyperlipidemia   . Seizures ([place])   . Prostatitis   . Balanitis   . Benign prostatic hypertrophy   . Cholecystitis with cholelithiasis   . Convulsive disorder ([place])   . Hyperlipidemia   . OSA (obstructive sleep apnea)   . Arthritis   . History of nuclear stress test     Myoview 11/16: EF 60%, normal perfusion, Low Risk    PAST SURGICAL HISTORY: Past Surgical History  Procedure Laterality Date  . Elbow surgery  11/11/2000    repair  . Tonsillectomy    . Fracture surgery      LEFT ELBOW    FAMILY HISTORY: Family History  Problem Relation Age of Onset  . Heart disease Father     cabg 17's  . Cancer Father     Prostate  . Heart disease Maternal Grandfather     Late 42's    SOCIAL HISTORY:  Social History   Social History  . Marital Status: Married    Spouse Name: N/A  . Number of Children: N/A  . Years of Education: N/A   Occupational History  . retired    Social History Main Topics  . Smoking status: Former Smoker -- 1.00 packs/day for 10 years    Quit date: 07/13/1977  . Smokeless tobacco: Never Used  . Alcohol Use: 1.2 oz/week    2 Cans of beer per week  . Drug Use: No  . Sexual Activity: Not on file   Other Topics Concern  . Not on file   Social History Narrative     PHYSICAL EXAM   Filed Vitals:   08/01/15 0953  BP: 147/86  Pulse: 57  Height: 5\' 9"   (1.753 m)  Weight: 176 lb (79.833 kg)    Not recorded      Body mass index is 25.98 kg/(m^2).  PHYSICAL EXAMNIATION:  Gen: NAD, conversant, well nourised, obese, well groomed                     Cardiovascular: Regular rate rhythm, no peripheral edema, warm, nontender. Eyes: Conjunctivae clear without exudates or hemorrhage Neck: Supple, no carotid bruise. Pulmonary: Clear to auscultation bilaterally   NEUROLOGICAL EXAM:  MENTAL STATUS: Speech:    Speech is normal; fluent and spontaneous with normal comprehension.  Cognition:     Orientation to time, place and person     Normal recent and remote memory     Normal Attention span and concentration     Normal Language, naming, repeating,spontaneous speech     Fund of knowledge   CRANIAL NERVES:  CN II: Visual fields are full to confrontation. Fundoscopic exam is normal with sharp discs and no vascular changes. Pupils are round equal and briskly reactive to light. CN III, IV, VI: extraocular movement are normal. No ptosis. CN V: Facial sensation is intact to pinprick in all 3 divisions bilaterally. Corneal responses are intact.  CN VII: Face is symmetric with normal eye closure and smile. CN VIII: Hearing is normal to rubbing fingers CN IX, X: Palate elevates symmetrically. Phonation is normal. CN XI: Head turning and shoulder shrug are intact CN XII: Tongue is midline with normal movements and no atrophy.  MOTOR: There is no pronator drift of out-stretched arms. Muscle bulk and tone are normal. Muscle strength is normal.  REFLEXES: Reflexes are 2+ and symmetric at the biceps, triceps, knees, and ankles. Plantar responses are flexor.  SENSORY: Intact to light touch, pinprick, position sense, and vibration sense are intact in fingers and toes.  COORDINATION: Rapid alternating movements and fine finger movements are intact. There is no dysmetria on finger-to-nose and heel-knee-shin.    GAIT/STANCE: Posture is normal. Gait  is steady with normal steps, base, arm swing, and turning. Heel and toe walking are normal. Tandem gait is normal.  Romberg is absent.   DIAGNOSTIC DATA (LABS, IMAGING, TESTING) - I reviewed patient records, labs, notes, testing and imaging myself where available.   ASSESSMENT AND PLAN  Anthony Juarez is a 69 y.o. male   Epilepsy  Doing well on Keppra XR 750 mg daily Right frontal cavernous angioma  Stable   Marcial Pacas, M.D. Ph.D.  [facility] [address] [place], [place] [address] Ph: [phone] Fax: [phone]  CC: Referring Provider

## 2015-08-13 ENCOUNTER — Telehealth: Payer: Self-pay | Admitting: Family Medicine

## 2015-08-13 NOTE — Telephone Encounter (Signed)
BY checking in the medication list, you can see that Dr. Krista Blue sent in a years supply to [facility] on Aug 01, 2015

## 2015-08-13 NOTE — Telephone Encounter (Signed)
lmom about rx was sent to pharmacy by neuro.

## 2015-08-13 NOTE — Telephone Encounter (Signed)
Dr Brigitte Pulse Pt comes into our office for a refill of Levetiracetam ER24  750mg  1 tab QD Dr Krista Blue, Sheria Lang Jun from [facility] gave this to him for only 1 month Please send into [facility] phone number is  [phone] please resapond to pt at [phone]

## 2015-08-21 ENCOUNTER — Ambulatory Visit (INDEPENDENT_AMBULATORY_CARE_PROVIDER_SITE_OTHER): Payer: Medicare Other | Admitting: Family Medicine

## 2015-08-21 VITALS — BP 130/80 | HR 60 | Temp 98.9°F | Resp 17 | Ht 69.0 in | Wt 175.0 lb

## 2015-08-21 DIAGNOSIS — K5900 Constipation, unspecified: Secondary | ICD-10-CM

## 2015-08-21 DIAGNOSIS — K641 Second degree hemorrhoids: Secondary | ICD-10-CM | POA: Diagnosis not present

## 2015-08-21 MED ORDER — HYDROCORTISONE ACETATE 25 MG RE SUPP: 25.0000 mg | Freq: Two times a day (BID) | RECTAL | Status: DC

## 2015-08-21 MED ORDER — HYDROCORTISONE 1 % RE CREA: 1.0000 "application " | TOPICAL_CREAM | Freq: Four times a day (QID) | RECTAL | Status: DC | PRN

## 2015-08-21 MED ORDER — POLYETHYLENE GLYCOL 3350 17 GM/SCOOP PO POWD: 17.0000 g | Freq: Two times a day (BID) | ORAL | Status: DC | PRN

## 2015-08-21 NOTE — Progress Notes (Signed)
Subjective:  By signing my name below, I, Anthony Juarez, attest that this documentation has been prepared under the direction and in the presence of Delman Cheadle, MD. Electronically Signed: Moises Juarez, [place]. 08/21/2015 , 12:54 PM .  Patient was seen in Room 9 .   Patient ID: Anthony Juarez, male    DOB: 1946-03-21, 70 y.o.   MRN: [id number] Chief Complaint  Patient presents with  . Constipation  . bloating  . anal itching   HPI Anthony Juarez is a 70 y.o. male who presents to [facility] complaining of intermittent abdominal bloating with "incomplete bowel movements" that started over a year ago. When he has a bowel movement, he feels like still having more. He's used stool softeners occasionally but without much relief. This causes irritation near his rectum. Dr. Benson Norway prescribed him naproxen cream a few years ago for anal itching. He denies heart burn recently. He still exercises at the gym.   He was started on nexium for heart burn. EGD performed found GERD. He denies taking any other OTC medications for this. He's been eating more yogurt and salad recently.   Past Medical History  Diagnosis Date  . Hyperlipidemia   . Seizures ([place])   . Prostatitis   . Balanitis   . Benign prostatic hypertrophy   . Cholecystitis with cholelithiasis   . Convulsive disorder ([place])   . Hyperlipidemia   . OSA (obstructive sleep apnea)   . Arthritis   . History of nuclear stress test     Myoview 11/16: EF 60%, normal perfusion, Low Risk   Prior to Admission medications   Medication Sig Start Date End Date Taking? Authorizing Provider  aspirin 81 MG tablet Take 81 mg by mouth daily.    Yes Historical Provider, MD  esomeprazole (NEXIUM) 20 MG packet Take 20 mg by mouth daily before breakfast. 03/28/15  Yes Jaynee Eagles, PA-C  Levetiracetam 750 MG TB24 Take 1 tablet (750 mg total) by mouth daily. 08/01/15  Yes Marcial Pacas, MD  MAGNESIUM CITRATE PO Take 150 mg by mouth daily.    Yes Historical Provider, MD    Multiple Vitamins-Minerals (MULTIVITAMIN WITH MINERALS) tablet Take 1 tablet by mouth daily.   Yes Historical Provider, MD   Allergies  Allergen Reactions  . Penicillins Rash    Review of Systems  Constitutional: Negative for fever, chills and fatigue.  HENT: Negative for postnasal drip, rhinorrhea and sore throat.   Respiratory: Negative for cough and shortness of breath.   Gastrointestinal: Positive for constipation, abdominal distention and rectal pain. Negative for nausea, vomiting and diarrhea.      Objective:   Physical Exam  Constitutional: He is oriented to person, place, and time. He appears well-developed and well-nourished. No distress.  HENT:  Head: Normocephalic and atraumatic.  Eyes: EOM are normal. Pupils are equal, round, and reactive to light.  Neck: Neck supple.  Cardiovascular: Normal rate, regular rhythm, S1 normal, S2 normal and normal heart sounds.   No murmur heard. Pulmonary/Chest: Effort normal and breath sounds normal. No respiratory distress. He has no wheezes.  Abdominal: Bowel sounds are normal. There is no CVA tenderness.  Genitourinary:  Large external hemorrhoid at approximately 7-11 o'clock no thrombosis, no internal hemorrhoids  Musculoskeletal: Normal range of motion.  Neurological: He is alert and oriented to person, place, and time.  Skin: Skin is warm and dry.  Psychiatric: He has a normal mood and affect. His behavior is normal.  Nursing note and vitals reviewed.   BP  130/80 mmHg  Pulse 60  Temp(Src) 98.9 F (37.2 C) (Oral)  Resp 17  Ht 5\' 9"  (1.753 m)  Wt 175 lb (79.379 kg)  BMI 25.83 kg/m2  SpO2 98%    Assessment & Plan:   1. Constipation, unspecified constipation type   2. Second degree hemorrhoids     Meds ordered this encounter  Medications  . DISCONTD: hydrocortisone (ANUSOL-HC) 25 MG suppository    Sig: Place 1 suppository (25 mg total) rectally 2 (two) times daily.    Dispense:  12 suppository    Refill:  1  .  hydrocortisone (PROCTO-PAK) 1 % CREA    Sig: Apply 1 application topically 4 (four) times daily as needed.    Dispense:  28.35 g    Refill:  3  . hydrocortisone (ANUSOL-HC) 25 MG suppository    Sig: Place 1 suppository (25 mg total) rectally 2 (two) times daily.    Dispense:  12 suppository    Refill:  1  . polyethylene glycol powder (GLYCOLAX/MIRALAX) powder    Sig: Take 17 g by mouth 2 (two) times daily as needed.    Dispense:  259 g    Refill:  11    I personally performed the services described in this documentation, which was scribed in my presence. The recorded information has been reviewed and considered, and addended by me as needed.  Delman Cheadle, MD MPH

## 2015-08-21 NOTE — Patient Instructions (Signed)
About Constipation  Constipation Overview Constipation is the most common gastrointestinal complaint - about 4 million Americans experience constipation and make 2.5 million physician visits a year to get help for the problem.  Constipation can occur when the colon absorbs too much water, the colon's muscle contraction is slow or sluggish, and/or there is delayed transit time through the colon.  The result is stool that is hard and dry.  Indicators of constipation include straining during bowel movements greater than 25% of the time, having fewer than three bowel movements per week, and/or the feeling of incomplete evacuation.  There are established guidelines ([place] II ) for defining constipation. A person needs to have two or more of the following symptoms for at least 12 weeks (not necessarily consecutive) in the preceding 12 months: . Straining in  greater than 25% of bowel movements . Lumpy or hard stools in greater than 25% of bowel movements . Sensation of incomplete emptying in greater than 25% of bowel movements . Sensation of anorectal obstruction/blockade in greater than 25% of bowel movements . Manual maneuvers to help empty greater than 25% of bowel movements (e.g., digital evacuation, support of the pelvic floor)  . Less than  3 bowel movements/week . Loose stools are not present, and criteria for irritable bowel syndrome are insufficient  Common Causes of Constipation . Lack of fiber in your diet . Lack of physical activity . Medications, including iron and calcium supplements  . Dairy intake . Dehydration . Abuse of laxatives  Travel  Irritable Bowel Syndrome  Pregnancy  Luteal phase of menstruation (after ovulation and before menses)  Colorectal problems  Intestinal Dysfunction  Treating Constipation  There are several ways of treating constipation, including changes to diet and exercise, use of laxatives, adjustments to the pelvic floor, and scheduled toileting.   These treatments include: . increasing fiber and fluids in the diet  . increasing physical activity . learning muscle coordination   learning proper toileting techniques and toileting modifications   designing and sticking  to a toileting schedule     2007, [employer] Doc.22 Constipation, Adult Constipation is when a person has fewer than three bowel movements a week, has difficulty having a bowel movement, or has stools that are dry, hard, or larger than normal. As people grow older, constipation is more common. A low-fiber diet, not taking in enough fluids, and taking certain medicines may make constipation worse.  CAUSES   Certain medicines, such as antidepressants, pain medicine, iron supplements, antacids, and water pills.   Certain diseases, such as diabetes, irritable bowel syndrome (IBS), thyroid disease, or depression.   Not drinking enough water.   Not eating enough fiber-rich foods.   Stress or travel.   Lack of physical activity or exercise.   Ignoring the urge to have a bowel movement.   Using laxatives too much.  SIGNS AND SYMPTOMS   Having fewer than three bowel movements a week.   Straining to have a bowel movement.   Having stools that are hard, dry, or larger than normal.   Feeling full or bloated.   Pain in the lower abdomen.   Not feeling relief after having a bowel movement.  DIAGNOSIS  Your health care provider will take a medical history and perform a physical exam. Further testing may be done for severe constipation. Some tests may include:  A barium enema X-ray to examine your rectum, colon, and, sometimes, your small intestine.   A sigmoidoscopy to examine your lower colon.  A colonoscopy to examine your entire colon. TREATMENT  Treatment will depend on the severity of your constipation and what is causing it. Some dietary treatments include drinking more fluids and eating more fiber-rich foods. Lifestyle  treatments may include regular exercise. If these diet and lifestyle recommendations do not help, your health care provider may recommend taking over-the-counter laxative medicines to help you have bowel movements. Prescription medicines may be prescribed if over-the-counter medicines do not work.  HOME CARE INSTRUCTIONS   Eat foods that have a lot of fiber, such as fruits, vegetables, whole grains, and beans.  Limit foods high in fat and processed sugars, such as french fries, hamburgers, cookies, candies, and soda.   A fiber supplement may be added to your diet if you cannot get enough fiber from foods.   Drink enough fluids to keep your urine clear or pale yellow.   Exercise regularly or as directed by your health care provider.   Go to the restroom when you have the urge to go. Do not hold it.   Only take over-the-counter or prescription medicines as directed by your health care provider. Do not take other medicines for constipation without talking to your health care provider first.  [place] IF:   You have bright red blood in your stool.   Your constipation lasts for more than 4 days or gets worse.   You have abdominal or rectal pain.   You have thin, pencil-like stools.   You have unexplained weight loss. MAKE SURE YOU:   Understand these instructions.  Will watch your condition.  Will get help right away if you are not doing well or get worse.   This information is not intended to replace advice given to you by your health care provider. Make sure you discuss any questions you have with your health care provider.   Document Released: 03/27/2004 Document Revised: 07/20/2014 Document Reviewed: 04/10/2013 Elsevier Interactive Patient Education [employer].

## 2015-09-02 ENCOUNTER — Ambulatory Visit (INDEPENDENT_AMBULATORY_CARE_PROVIDER_SITE_OTHER): Payer: Medicare Other | Admitting: Family Medicine

## 2015-09-02 ENCOUNTER — Ambulatory Visit (INDEPENDENT_AMBULATORY_CARE_PROVIDER_SITE_OTHER): Payer: Medicare Other

## 2015-09-02 VITALS — BP 118/70 | HR 59 | Temp 99.3°F | Resp 16 | Ht 69.0 in | Wt 170.0 lb

## 2015-09-02 DIAGNOSIS — R05 Cough: Secondary | ICD-10-CM

## 2015-09-02 DIAGNOSIS — J209 Acute bronchitis, unspecified: Secondary | ICD-10-CM | POA: Diagnosis not present

## 2015-09-02 DIAGNOSIS — R059 Cough, unspecified: Secondary | ICD-10-CM

## 2015-09-02 DIAGNOSIS — R0602 Shortness of breath: Secondary | ICD-10-CM

## 2015-09-02 DIAGNOSIS — R51 Headache: Secondary | ICD-10-CM

## 2015-09-02 DIAGNOSIS — R5383 Other fatigue: Secondary | ICD-10-CM | POA: Diagnosis not present

## 2015-09-02 DIAGNOSIS — R509 Fever, unspecified: Secondary | ICD-10-CM | POA: Diagnosis not present

## 2015-09-02 DIAGNOSIS — R062 Wheezing: Secondary | ICD-10-CM | POA: Diagnosis not present

## 2015-09-02 LAB — POCT CBC
GRANULOCYTE PERCENT: 63 % (ref 37–80)
HEMATOCRIT: 43.9 % (ref 43.5–53.7)
Hemoglobin: 14.9 g/dL (ref 14.1–18.1)
Lymph, poc: 2.6 (ref 0.6–3.4)
MCH: 30.1 pg (ref 27–31.2)
MCHC: 33.9 g/dL (ref 31.8–35.4)
MCV: 88.7 fL (ref 80–97)
MID (CBC): 0.7 (ref 0–0.9)
MPV: 6.1 fL (ref 0–99.8)
PLATELET COUNT, POC: 260 10*3/uL (ref 142–424)
POC GRANULOCYTE: 5.5 (ref 2–6.9)
POC LYMPH %: 29.5 % (ref 10–50)
POC MID %: 7.5 %M (ref 0–12)
RBC: 4.95 M/uL (ref 4.69–6.13)
RDW, POC: 13.7 %
WBC: 8.8 10*3/uL (ref 4.6–10.2)

## 2015-09-02 LAB — POCT INFLUENZA A/B
INFLUENZA B, POC: NEGATIVE
Influenza A, POC: NEGATIVE

## 2015-09-02 MED ORDER — ALBUTEROL SULFATE (2.5 MG/3ML) 0.083% IN NEBU
2.5000 mg | INHALATION_SOLUTION | Freq: Once | RESPIRATORY_TRACT | Status: AC
  Administered 2015-09-02: 2.5 mg via RESPIRATORY_TRACT

## 2015-09-02 MED ORDER — MONTELUKAST SODIUM 10 MG PO TABS: 10.0000 mg | ORAL_TABLET | Freq: Every day | ORAL | Status: DC

## 2015-09-02 MED ORDER — HYDROCOD POLST-CPM POLST ER 10-8 MG/5ML PO SUER: 5.0000 mL | Freq: Two times a day (BID) | ORAL | Status: DC | PRN

## 2015-09-02 MED ORDER — MUCINEX DM MAXIMUM STRENGTH 60-1200 MG PO TB12: 1.0000 | ORAL_TABLET | Freq: Two times a day (BID) | ORAL | Status: DC

## 2015-09-02 MED ORDER — ALBUTEROL SULFATE 108 (90 BASE) MCG/ACT IN AEPB: 2.0000 | INHALATION_SPRAY | RESPIRATORY_TRACT | Status: DC | PRN

## 2015-09-02 MED ORDER — BREATHERITE COLL SPACER ADULT MISC: Status: DC

## 2015-09-02 NOTE — Progress Notes (Signed)
Subjective:    Patient ID: Anthony Juarez, male    DOB: 23-Feb-1946, 70 y.o.   MRN: [id number] By signing my name below, I, Zola Button, attest that this documentation has been prepared under the direction and in the presence of Delman Cheadle, MD.  Electronically Signed: Zola Button, Medical Scribe. 09/02/2015. 11:10 AM.  Chief Complaint  Patient presents with  . Chest Congestion    x 5 days  . Cough    HPI HPI Comments: Anthony Juarez is a 70 y.o. male with a history of asthma (in childhood) who presents to the [facility] complaining of gradual onset, minimally productive cough of clear sputum that started 5 days ago. Patient reports having associated chest congestion, wheezing, fever, headache, and mild SOB. He has had difficulty sleeping due to his symptoms. He has been taking ibuprofen and Tylenol for his symptoms. Patient denies nasal congestion and rhinorrhea. He quit smoking about 35 years ago.  Past Medical History  Diagnosis Date  . Hyperlipidemia   . Seizures ([place])   . Prostatitis   . Balanitis   . Benign prostatic hypertrophy   . Cholecystitis with cholelithiasis   . Convulsive disorder ([place])   . Hyperlipidemia   . OSA (obstructive sleep apnea)   . Arthritis   . History of nuclear stress test     Myoview 11/16: EF 60%, normal perfusion, Low Risk   Past Surgical History  Procedure Laterality Date  . Elbow surgery  11/11/2000    repair  . Tonsillectomy    . Fracture surgery      LEFT ELBOW   Current Outpatient Prescriptions on File Prior to Visit  Medication Sig Dispense Refill  . aspirin 81 MG tablet Take 81 mg by mouth daily.     Marland Kitchen esomeprazole (NEXIUM) 20 MG packet Take 20 mg by mouth daily before breakfast. 30 each 12  . hydrocortisone (ANUSOL-HC) 25 MG suppository Place 1 suppository (25 mg total) rectally 2 (two) times daily. 12 suppository 1  . Levetiracetam 750 MG TB24 Take 1 tablet (750 mg total) by mouth daily. 90 tablet 3  .  MAGNESIUM CITRATE PO Take 150 mg by mouth daily.     . Multiple Vitamins-Minerals (MULTIVITAMIN WITH MINERALS) tablet Take 1 tablet by mouth daily.    . polyethylene glycol powder (GLYCOLAX/MIRALAX) powder Take 17 g by mouth 2 (two) times daily as needed. 259 g 11   No current facility-administered medications on file prior to visit.   Allergies  Allergen Reactions  . Penicillins Rash   Family History  Problem Relation Age of Onset  . Heart disease Father     cabg 71's  . Cancer Father     Prostate  . Heart disease Maternal Grandfather     Late 59's   Social History   Social History  . Marital Status: Married    Spouse Name: N/A  . Number of Children: N/A  . Years of Education: N/A   Occupational History  . retired    Social History Main Topics  . Smoking status: Former Smoker -- 1.00 packs/day for 10 years    Quit date: 07/13/1977  . Smokeless tobacco: Never Used  . Alcohol Use: 1.2 oz/week    2 Cans of beer per week  . Drug Use: No  . Sexual Activity: Not Asked   Other Topics Concern  . None   Social History Narrative    Review of Systems  Constitutional: Positive for fever, diaphoresis, activity change  and fatigue. Negative for appetite change and unexpected weight change.  HENT: Positive for congestion. Negative for rhinorrhea and sinus pressure.   Respiratory: Positive for cough, chest tightness, shortness of breath and wheezing.   Gastrointestinal: Positive for abdominal pain, constipation, anal bleeding and rectal pain. Negative for nausea, vomiting and diarrhea.  Allergic/Immunologic: Negative for immunocompromised state.  Neurological: Positive for headaches.  Hematological: Negative for adenopathy.  Psychiatric/Behavioral: Positive for sleep disturbance.       Objective:  BP 118/70 mmHg  Pulse 59  Temp(Src) 99.3 F (37.4 C)  Resp 16  Ht 5\' 9"  (1.753 m)  Wt 170 lb (77.111 kg)  BMI 25.09 kg/m2  SpO2 99%  Physical Exam  Constitutional: He is  oriented to person, place, and time. He appears well-developed and well-nourished. No distress.  HENT:  Head: Normocephalic and atraumatic.  Right Ear: Tympanic membrane normal.  Left Ear: Tympanic membrane normal.  Nose: Rhinorrhea present.  Mouth/Throat: Posterior oropharyngeal erythema present. No oropharyngeal exudate.  Nasal mucosal rhinorrhea. Red oropharynx.  Eyes: Pupils are equal, round, and reactive to light.  Neck: Neck supple.  Cardiovascular: Normal rate.   Pulmonary/Chest: Effort normal. He has wheezes. He has rhonchi.  Expiratory wheezing and rhonchi throughout.  Musculoskeletal: He exhibits no edema.  Neurological: He is alert and oriented to person, place, and time. No cranial nerve deficit.  Skin: Skin is warm and dry. No rash noted.  Psychiatric: He has a normal mood and affect. His behavior is normal.  Nursing note and vitals reviewed.  Results for orders placed or performed in visit on 09/02/15  POCT CBC  Result Value Ref Range   WBC 8.8 4.6 - 10.2 K/uL   Lymph, poc 2.6 0.6 - 3.4   POC LYMPH PERCENT 29.5 10 - 50 %L   MID (cbc) 0.7 0 - 0.9   POC MID % 7.5 0 - 12 %M   POC Granulocyte 5.5 2 - 6.9   Granulocyte percent 63.0 37 - 80 %G   RBC 4.95 4.69 - 6.13 M/uL   Hemoglobin 14.9 14.1 - 18.1 g/dL   HCT, POC 43.9 43.5 - 53.7 %   MCV 88.7 80 - 97 fL   MCH, POC 30.1 27 - 31.2 pg   MCHC 33.9 31.8 - 35.4 g/dL   RDW, POC 13.7 %   Platelet Count, POC 260 142 - 424 K/uL   MPV 6.1 0 - 99.8 fL  POCT Influenza A/B  Result Value Ref Range   Influenza A, POC Negative Negative   Influenza B, POC Negative Negative   Dg Chest 2 View  09/02/2015  CLINICAL DATA:  History of childhood asthma ; gradual onset of mildly productive cough 5 days ago associated with chest congestion wheezing and fever and shortness of breath. EXAM: CHEST  2 VIEW COMPARISON:  PA and lateral chest x-ray of March 24, 2015 FINDINGS: The lungs are hyperinflated with hemidiaphragm flattening. There  is no focal infiltrate. There is no pleural effusion. The heart and pulmonary vascularity are normal. The mediastinum is normal in width. The bony thorax exhibits no acute abnormality. IMPRESSION: Hyperinflation consistent with reactive airway disease or COPD. There is no pneumonia, CHF, nor other acute cardiopulmonary abnormality. Electronically Signed   By: David  Martinique M.D.   On: 09/02/2015 11:48       Assessment & Plan:   1. Cough   2. Acute bronchitis, unspecified organism     Orders Placed This Encounter  Procedures  . DG Chest 2 View  Standing Status: Future     Number of Occurrences: 1     Standing Expiration Date: 09/01/2016    Order Specific Question:  Reason for Exam (SYMPTOM  OR DIAGNOSIS REQUIRED)    Answer:  exp wheeze and rhonchi- ill x 5d    Order Specific Question:  Preferred imaging location?    Answer:  External  . POCT CBC  . POCT Influenza A/B    Meds ordered this encounter  Medications  . albuterol (PROVENTIL) (2.5 MG/3ML) 0.083% nebulizer solution 2.5 mg    Sig:   . Dextromethorphan-Guaifenesin (MUCINEX DM MAXIMUM STRENGTH) 60-1200 MG TB12    Sig: Take 1 tablet by mouth every 12 (twelve) hours.    Dispense:  14 each    Refill:  1  . montelukast (SINGULAIR) 10 MG tablet    Sig: Take 1 tablet (10 mg total) by mouth at bedtime.    Dispense:  30 tablet    Refill:  3  . Albuterol Sulfate (PROAIR RESPICLICK) 123XX123 (90 Base) MCG/ACT AEPB    Sig: Inhale 2 puffs into the lungs every 4 (four) hours as needed.    Dispense:  1 each    Refill:  2  . Spacer/Aero-Holding Chambers (BREATHERITE COLL SPACER ADULT) MISC    Sig: Use with albuterol inhaler    Dispense:  1 each    Refill:  0    Please dispense any spacer that works with the albuterol inhaler and please demonstrate use  . chlorpheniramine-HYDROcodone (TUSSIONEX PENNKINETIC ER) 10-8 MG/5ML SUER    Sig: Take 5 mLs by mouth every 12 (twelve) hours as needed.    Dispense:  120 mL    Refill:  0    I  personally performed the services described in this documentation, which was scribed in my presence. The recorded information has been reviewed and considered, and addended by me as needed.  Delman Cheadle, MD MPH

## 2015-09-02 NOTE — Patient Instructions (Addendum)
Because you received an x-ray today, you will receive an invoice from [facility]. Please contact [facility] at [phone] with questions or concerns regarding your invoice. Our billing staff will not be able to assist you with those questions. Acute Bronchitis Bronchitis is inflammation of the airways that extend from the windpipe into the lungs (bronchi). The inflammation often causes mucus to develop. This leads to a cough, which is the most common symptom of bronchitis.  In acute bronchitis, the condition usually develops suddenly and goes away over time, usually in a couple weeks. Smoking, allergies, and asthma can make bronchitis worse. Repeated episodes of bronchitis may cause further lung problems.  CAUSES Acute bronchitis is most often caused by the same virus that causes a cold. The virus can spread from person to person (contagious) through coughing, sneezing, and touching contaminated objects. SIGNS AND SYMPTOMS   Cough.   Fever.   Coughing up mucus.   Body aches.   Chest congestion.   Chills.   Shortness of breath.   Sore throat.  DIAGNOSIS  Acute bronchitis is usually diagnosed through a physical exam. Your health care provider will also ask you questions about your medical history. Tests, such as chest X-rays, are sometimes done to rule out other conditions.  TREATMENT  Acute bronchitis usually goes away in a couple weeks. Oftentimes, no medical treatment is necessary. Medicines are sometimes given for relief of fever or cough. Antibiotic medicines are usually not needed but may be prescribed in certain situations. In some cases, an inhaler may be recommended to help reduce shortness of breath and control the cough. A cool mist vaporizer may also be used to help thin bronchial secretions and make it easier to clear the chest.  HOME CARE INSTRUCTIONS  Get plenty of rest.   Drink enough fluids to keep your urine clear or pale yellow (unless  you have a medical condition that requires fluid restriction). Increasing fluids may help thin your respiratory secretions (sputum) and reduce chest congestion, and it will prevent dehydration.   Take medicines only as directed by your health care provider.  If you were prescribed an antibiotic medicine, finish it all even if you start to feel better.  Avoid smoking and secondhand smoke. Exposure to cigarette smoke or irritating chemicals will make bronchitis worse. If you are a smoker, consider using nicotine gum or skin patches to help control withdrawal symptoms. Quitting smoking will help your lungs heal faster.   Reduce the chances of another bout of acute bronchitis by washing your hands frequently, avoiding people with cold symptoms, and trying not to touch your hands to your mouth, nose, or eyes.   Keep all follow-up visits as directed by your health care provider.  SEEK MEDICAL CARE IF: Your symptoms do not improve after 1 week of treatment.  SEEK IMMEDIATE MEDICAL CARE IF:  You develop an increased fever or chills.   You have chest pain.   You have severe shortness of breath.  You have bloody sputum.   You develop dehydration.  You faint or repeatedly feel like you are going to pass out.  You develop repeated vomiting.  You develop a severe headache. MAKE SURE YOU:   Understand these instructions.  Will watch your condition.  Will get help right away if you are not doing well or get worse.   This information is not intended to replace advice given to you by your health care provider. Make sure you discuss any questions you have with your   health care provider.   Document Released: 08/06/2004 Document Revised: 07/20/2014 Document Reviewed: 12/20/2012 Elsevier Interactive Patient Education 2016 [employer]vier Inc.  

## 2015-12-30 ENCOUNTER — Other Ambulatory Visit: Payer: Self-pay | Admitting: Family Medicine

## 2016-03-09 ENCOUNTER — Other Ambulatory Visit: Payer: Self-pay | Admitting: Emergency Medicine

## 2016-04-07 ENCOUNTER — Other Ambulatory Visit: Payer: Self-pay | Admitting: Emergency Medicine

## 2016-04-10 ENCOUNTER — Ambulatory Visit: Payer: Medicare Other

## 2016-04-11 ENCOUNTER — Ambulatory Visit (INDEPENDENT_AMBULATORY_CARE_PROVIDER_SITE_OTHER): Payer: Medicare Other | Admitting: Family Medicine

## 2016-04-11 VITALS — BP 132/74 | HR 58 | Temp 98.4°F | Resp 16 | Ht 68.0 in | Wt 174.0 lb

## 2016-04-11 DIAGNOSIS — N529 Male erectile dysfunction, unspecified: Secondary | ICD-10-CM | POA: Diagnosis not present

## 2016-04-11 DIAGNOSIS — N138 Other obstructive and reflux uropathy: Secondary | ICD-10-CM | POA: Diagnosis not present

## 2016-04-11 DIAGNOSIS — N411 Chronic prostatitis: Secondary | ICD-10-CM | POA: Diagnosis not present

## 2016-04-11 DIAGNOSIS — R350 Frequency of micturition: Secondary | ICD-10-CM

## 2016-04-11 DIAGNOSIS — N401 Enlarged prostate with lower urinary tract symptoms: Secondary | ICD-10-CM | POA: Diagnosis not present

## 2016-04-11 LAB — POCT URINALYSIS DIP (MANUAL ENTRY)
BILIRUBIN UA: NEGATIVE
GLUCOSE UA: NEGATIVE
Ketones, POC UA: NEGATIVE
Leukocytes, UA: NEGATIVE
NITRITE UA: NEGATIVE
PH UA: 7
Protein Ur, POC: NEGATIVE
SPEC GRAV UA: 1.015
UROBILINOGEN UA: 0.2

## 2016-04-11 LAB — POCT CBC
Granulocyte percent: 52.1 % (ref 37–80)
HCT, POC: 43.5 % (ref 43.5–53.7)
Hemoglobin: 15.3 g/dL (ref 14.1–18.1)
Lymph, poc: 2.5 (ref 0.6–3.4)
MCH, POC: 30.6 pg (ref 27–31.2)
MCHC: 35.1 g/dL (ref 31.8–35.4)
MCV: 87.1 fL (ref 80–97)
MID (cbc): 0.6 (ref 0–0.9)
MPV: 6.4 fL (ref 0–99.8)
POC Granulocyte: 3.3 (ref 2–6.9)
POC LYMPH PERCENT: 39 % (ref 10–50)
POC MID %: 8.9 % (ref 0–12)
Platelet Count, POC: 268 K/uL (ref 142–424)
RBC: 5 M/uL (ref 4.69–6.13)
RDW, POC: 13.2 %
WBC: 6.4 K/uL (ref 4.6–10.2)

## 2016-04-11 LAB — HEMOCCULT GUIAC POC 1CARD (OFFICE)
Fecal Occult Blood, POC: NEGATIVE
OCCULT BLOOD DATE: 9302017

## 2016-04-11 LAB — POC MICROSCOPIC URINALYSIS (UMFC)

## 2016-04-11 LAB — PSA: PSA: 0.8 ng/mL (ref <=4.0)

## 2016-04-11 MED ORDER — DOXAZOSIN MESYLATE 2 MG PO TABS: 2.0000 mg | ORAL_TABLET | Freq: Every day | ORAL | 1 refills | Status: DC

## 2016-04-11 MED ORDER — SULFAMETHOXAZOLE-TRIMETHOPRIM 800-160 MG PO TABS: 1.0000 | ORAL_TABLET | Freq: Two times a day (BID) | ORAL | 0 refills | Status: DC

## 2016-04-11 MED ORDER — TADALAFIL 5 MG PO TABS: 5.0000 mg | ORAL_TABLET | Freq: Every day | ORAL | 5 refills | Status: DC

## 2016-04-11 MED ORDER — MONTELUKAST SODIUM 10 MG PO TABS: 10.0000 mg | ORAL_TABLET | Freq: Every day | ORAL | 3 refills | Status: DC

## 2016-04-11 MED ORDER — FINASTERIDE 5 MG PO TABS: 5.0000 mg | ORAL_TABLET | Freq: Every day | ORAL | 5 refills | Status: DC

## 2016-04-11 NOTE — Progress Notes (Addendum)
By signing my name below I, Tereasa Coop, attest that this documentation has been prepared under the direction and in the presence of Delman Cheadle, MD. Electonically Signed. Tereasa Coop, Scribe 04/11/2016 at 9:27 AM  Subjective:    Patient ID: Anthony Juarez, male    DOB: [birth year], 70 y.o.   MRN: [id number]  Chief Complaint  Patient presents with  . Referral    for urology  . Urinary Frequency    need to check prostate    HPI Anthony Juarez is a 70 y.o. male who presents to the [facility] complaining of urinary frequency   Does have urinary frequency and occasional urgency, no hesitancy, does have some trouble with complete emptying and dribbiling after.  Did try some flomax sev yrs ago without benefit .  Saw Dr. Venia Minks in [place]. No dysuria. No pain with urination, urine looks normal.  Did try from urologist some hormone with a delicate dosing procedure and had side effects of increasing na level so had to be closely monitored though did help relieve his nocturia.  Had overnight volume test.  Had been seeing a urologist and tried caverject which did work but overall not a prior experience.   Normal PSAs annually, last done one year prior  Was recommended to do colonoscopy every 5 years and is due for a repeat. He is taking the nexium but having problem getting it down. Treating indigestion and belching being treated with fennel seeds.    Patient Active Problem List   Diagnosis Date Noted  . Esophageal reflux 03/28/2015  . Shortness of breath 03/28/2015  . Skin lesion of face 03/28/2015  . AVM (arteriovenous malformation) brain 06/15/2013  . Seizure disorder ([place]) 01/19/2013  . Central sleep apnea 06/10/2012    Current Outpatient Prescriptions on File Prior to Visit  Medication Sig Dispense Refill  . Albuterol Sulfate (PROAIR RESPICLICK) 123XX123 (90 Base) MCG/ACT AEPB Inhale 2 puffs into the lungs every 4 (four) hours as needed. 1 each 2  . aspirin  81 MG tablet Take 81 mg by mouth daily.     Marland Kitchen esomeprazole (NEXIUM) 20 MG packet Take 20 mg by mouth daily before breakfast. 30 each 12  . Levetiracetam 750 MG TB24 Take 1 tablet (750 mg total) by mouth daily. 90 tablet 3  . MAGNESIUM CITRATE PO Take 150 mg by mouth daily.     . montelukast (SINGULAIR) 10 MG tablet TAKE ONE TABLET AT BEDTIME. 30 tablet 0  . Multiple Vitamins-Minerals (MULTIVITAMIN WITH MINERALS) tablet Take 1 tablet by mouth daily.    . polyethylene glycol powder (GLYCOLAX/MIRALAX) powder Take 17 g by mouth 2 (two) times daily as needed. 259 g 11  . hydrocortisone (ANUSOL-HC) 25 MG suppository Place 1 suppository (25 mg total) rectally 2 (two) times daily. (Patient not taking: Reported on 04/11/2016) 12 suppository 1   No current facility-administered medications on file prior to visit.     Allergies  Allergen Reactions  . Penicillins Rash    Depression screen [facility] 2/9 04/11/2016 09/02/2015 08/21/2015 03/28/2015 03/24/2015  Decreased Interest 0 0 0 0 0  Down, Depressed, Hopeless 0 0 0 0 0  PHQ - 2 Score 0 0 0 0 0      Review of Systems  Constitutional: Positive for fatigue. Negative for activity change, appetite change, fever and unexpected weight change.  Respiratory: Negative for chest tightness.   Cardiovascular: Negative for chest pain and leg swelling.  Gastrointestinal: Negative for abdominal distention, abdominal pain,  anal bleeding, blood in stool, constipation, diarrhea, nausea, rectal pain and vomiting.  Genitourinary: Positive for decreased urine volume, difficulty urinating, frequency and urgency. Negative for discharge, dysuria, enuresis, flank pain, genital sores, hematuria, penile pain, penile swelling, scrotal swelling and testicular pain.  Neurological: Negative for dizziness, light-headedness and headaches.  Psychiatric/Behavioral: Positive for sleep disturbance.       Objective:   Physical Exam  Constitutional: He is oriented to person, place, and  time. He appears well-developed and well-nourished. No distress.  HENT:  Head: Normocephalic and atraumatic.  Eyes: No scleral icterus.  Pulmonary/Chest: Effort normal.  Genitourinary: Rectal exam shows external hemorrhoid. Rectal exam shows no internal hemorrhoid, no tenderness and guaiac negative stool. Prostate is enlarged. Prostate is not tender.  Neurological: He is alert and oriented to person, place, and time.  Skin: Skin is warm and dry. He is not diaphoretic.  Psychiatric: He has a normal mood and affect. His behavior is normal.   BP 132/74 (BP Location: Right Arm, Patient Position: Sitting, Cuff Size: Normal)   Pulse (!) 58   Temp 98.4 F (36.9 C) (Oral)   Resp 16   Ht 5\' 8"  (1.727 m)   Wt 174 lb (78.9 kg)   SpO2 98%   BMI 26.46 kg/m         Assessment & Plan:   1. Urinary frequency   2. Chronic bacterial prostatitis   3. Benign prostatic hypertrophy with urinary obstruction   4. Erectile dysfunction, unspecified erectile dysfunction type    Cover with bactrim bid x 1 mo for chronic bacterial prostatitis due to h/o, sxs, and numerous bacteria on urine sample without as many wbcs and minimal epithelial cells.  I am hesitant to do a prolonged course of cipro since Mr. Baugher is so active.  Unfortunately, it seems like he has a very prolonged h/o ED which is now taking a toll on his marriage - his wife absolutely refuses to let him use any kind of medication to aid in erection believing that "it isn't real" if medication is used.  He is also having BPH sxs which have been quite resistant previously to flomax and other meds. It has been a while since he has seen urology though he is open to this if needed. Has had his testosterone checked sev times prior which has been nml.    I encouraged pt to try small daily dose of cialis for BPH/urinary sxs - not ED - but he is unwilling as he knows it would cause sig problems in his marriage to even have the medication at home.   Freely admits that (not suprisingly) he a lot of discussing this topic at all - even with his wife and providers.  Has never gone to couples/marriage therapy but reluctantly agrees that it is probably a good idea - recommendations for [place] or [facility] given.  Orders Placed This Encounter  Procedures  . Urine culture  . PSA  . Ambulatory referral to Urology    Referral Priority:   Routine    Referral Type:   Consultation    Referral Reason:   Specialty Services Required    Requested Specialty:   Urology    Number of Visits Requested:   1  . POCT urinalysis dipstick  . POCT Microscopic Urinalysis ([facility])  . POCT CBC  . POCT occult blood stool    Meds ordered this encounter  Medications  . calcium citrate-vitamin D (CITRACAL+D) 315-200 MG-UNIT tablet    Sig: Take  1 tablet by mouth 2 (two) times daily.  Marland Kitchen sulfamethoxazole-trimethoprim (BACTRIM DS,SEPTRA DS) 800-160 MG tablet    Sig: Take 1 tablet by mouth 2 (two) times daily.    Dispense:  70 tablet    Refill:  0  . finasteride (PROSCAR) 5 MG tablet    Sig: Take 1 tablet (5 mg total) by mouth daily.    Dispense:  30 tablet    Refill:  5  . tadalafil (CIALIS) 5 MG tablet    Sig: Take 1 tablet (5 mg total) by mouth daily.    Dispense:  30 tablet    Refill:  5  . doxazosin (CARDURA) 2 MG tablet    Sig: Take 1 tablet (2 mg total) by mouth daily.    Dispense:  30 tablet    Refill:  1  . montelukast (SINGULAIR) 10 MG tablet    Sig: Take 1 tablet (10 mg total) by mouth at bedtime.    Dispense:  90 tablet    Refill:  3     Delman Cheadle, M.D.  [facility] [place], [place] [address] [phone] phone [phone] fax  04/13/16 2:29 PM  Results for orders placed or performed in visit on 04/11/16  Urine culture  Result Value Ref Range   Preliminary Report Culture reincubated for better growth   PSA  Result Value Ref Range   PSA 0.8 <=4.0 ng/mL  POCT urinalysis  dipstick  Result Value Ref Range   Color, UA yellow yellow   Clarity, UA clear clear   Glucose, UA negative negative   Bilirubin, UA negative negative   Ketones, POC UA negative negative   Spec Grav, UA 1.015    Blood, UA trace-intact (A) negative   pH, UA 7.0    Protein Ur, POC negative negative   Urobilinogen, UA 0.2    Nitrite, UA Negative Negative   Leukocytes, UA Negative Negative  POCT Microscopic Urinalysis ([facility])  Result Value Ref Range   WBC,UR,HPF,POC None None WBC/hpf   RBC,UR,HPF,POC Few (A) None RBC/hpf   Bacteria Moderate (A) None, Too numerous to count   Mucus Present (A) Absent   Epithelial Cells, UR Per Microscopy None None, Too numerous to count cells/hpf  POCT CBC  Result Value Ref Range   WBC 6.4 4.6 - 10.2 K/uL   Lymph, poc 2.5 0.6 - 3.4   POC LYMPH PERCENT 39.0 10 - 50 %L   MID (cbc) 0.6 0 - 0.9   POC MID % 8.9 0 - 12 %M   POC Granulocyte 3.3 2 - 6.9   Granulocyte percent 52.1 37 - 80 %G   RBC 5.00 4.69 - 6.13 M/uL   Hemoglobin 15.3 14.1 - 18.1 g/dL   HCT, POC 43.5 43.5 - 53.7 %   MCV 87.1 80 - 97 fL   MCH, POC 30.6 27 - 31.2 pg   MCHC 35.1 31.8 - 35.4 g/dL   RDW, POC 13.2 %   Platelet Count, POC 268 142 - 424 K/uL   MPV 6.4 0 - 99.8 fL  POCT occult blood stool  Result Value Ref Range   Fecal Occult Blood, POC Negative Negative   Card #1 Date [id number]    Card #2 Fecal Occult Blod, POC     Card #2 Date     Card #3 Fecal Occult Blood, POC     Card #3 Date

## 2016-04-11 NOTE — Patient Instructions (Addendum)
IF you received an x-ray today, you will receive an invoice from [facility]. Please contact [facility] at [phone] with questions or concerns regarding your invoice.   IF you received labwork today, you will receive an invoice from [employer]. Please contact [facility] at [phone] with questions or concerns regarding your invoice.   Our billing staff will not be able to assist you with questions regarding bills from these companies.  You will be contacted with the lab results as soon as they are available. The fastest way to get your results is to activate your My Chart account. Instructions are located on the last page of this paperwork. If you have not heard from [place] regarding the results in 2 weeks, please contact this office.      Prostatitis The prostate gland is about the size and shape of a walnut. It is located just below your bladder. It produces one of the components of semen, which is made up of sperm and the fluids that help nourish and transport it out from the testicles. Prostatitis is inflammation of the prostate gland.  There are four types of prostatitis:  Acute bacterial prostatitis. This is the least common type of prostatitis. It starts quickly and usually is associated with a bladder infection, high fever, and shaking chills. It can occur at any age.  Chronic bacterial prostatitis. This is a persistent bacterial infection in the prostate. It usually develops from repeated acute bacterial prostatitis or acute bacterial prostatitis that was not properly treated. It can occur in men of any age but is most common in middle-aged men whose prostate has begun to enlarge. The symptoms are not as severe as those in acute bacterial prostatitis. Discomfort in the part of your body that is in front of your rectum and below your scrotum (perineum), lower abdomen, or in the head of your penis (glans) may represent your primary  discomfort.  Chronic prostatitis (nonbacterial). This is the most common type of prostatitis. It is inflammation of the prostate gland that is not caused by a bacterial infection. The cause is unknown and may be associated with a viral infection or autoimmune disorder.  Prostatodynia (pelvic floor disorder). This is associated with increased muscular tone in the pelvis surrounding the prostate. CAUSES The causes of bacterial prostatitis are bacterial infection. The causes of the other types of prostatitis are unknown.  SYMPTOMS  Symptoms can vary depending upon the type of prostatitis that exists. There can also be overlap in symptoms. Possible symptoms for each type of prostatitis are listed below. Acute Bacterial Prostatitis  Painful urination.  Fever or chills.  Muscle or joint pains.  Low back pain.  Low abdominal pain.  Inability to empty bladder completely. Chronic Bacterial Prostatitis, Chronic Nonbacterial Prostatitis, and Prostatodynia  Sudden urge to urinate.  Frequent urination.  Difficulty starting urine stream.  Weak urine stream.  Discharge from the urethra.  Dribbling after urination.  Rectal pain.  Pain in the testicles, penis, or tip of the penis.  Pain in the perineum.  Problems with sexual function.  Painful ejaculation.  Bloody semen. DIAGNOSIS  In order to diagnose prostatitis, your health care provider will ask about your symptoms. One or more urine samples will be taken and tested (urinalysis). If the urinalysis result is negative for bacteria, your health care provider may use a finger to feel your prostate (digital rectal exam). This exam helps your health care provider determine if your prostate is swollen and tender.  It will also produce a specimen of semen that can be analyzed. TREATMENT  Treatment for prostatitis depends on the cause. If a bacterial infection is the cause, it can be treated with antibiotic medicine. In cases of chronic  bacterial prostatitis, the use of antibiotics for up to 1 month or 6 weeks may be necessary. Your health care provider may instruct you to take sitz baths to help relieve pain. A sitz bath is a bath of hot water in which your hips and buttocks are under water. This relaxes the pelvic floor muscles and often helps to relieve the pressure on your prostate. HOME CARE INSTRUCTIONS   Take all medicines as directed by your health care provider.  Take sitz baths as directed by your health care provider. SEEK MEDICAL CARE IF:   Your symptoms get worse, not better.  You have a fever. SEEK IMMEDIATE MEDICAL CARE IF:   You have chills.  You feel nauseous or vomit.  You feel lightheaded or faint.  You are unable to urinate.  You have blood or blood clots in your urine. MAKE SURE YOU:  Understand these instructions.  Will watch your condition.  Will get help right away if you are not doing well or get worse.   This information is not intended to replace advice given to you by your health care provider. Make sure you discuss any questions you have with your health care provider.   Document Released: 06/26/2000 Document Revised: 07/20/2014 Document Reviewed: 01/16/2013 Elsevier Interactive Patient Education [employer].

## 2016-04-14 LAB — URINE CULTURE

## 2016-04-15 LAB — URINE CULTURE

## 2016-04-15 NOTE — Telephone Encounter (Signed)
This was done.

## 2016-04-20 MED ORDER — CIPROFLOXACIN HCL 500 MG PO TABS: 500.0000 mg | ORAL_TABLET | Freq: Two times a day (BID) | ORAL | 0 refills | Status: DC

## 2016-04-20 NOTE — Addendum Note (Signed)
Addended by: Delman Cheadle on: 04/20/2016 03:00 PM   Modules accepted: Orders

## 2016-04-23 ENCOUNTER — Telehealth: Payer: Self-pay

## 2016-04-23 NOTE — Telephone Encounter (Signed)
Pa for cialis started for bph and ed diagnosis Reference # [id number]

## 2016-04-24 NOTE — Telephone Encounter (Signed)
PA was denied. [employer] requires that pt try/fail dutasteride capsule, alfuzosin ER tab, and dutasteride-tamsulosin ER caps first. Do you want to try one of these other meds? Or refer to urologist?

## 2016-04-24 NOTE — Telephone Encounter (Signed)
We will just wait for his urology referral. Thank you

## 2016-04-29 DIAGNOSIS — L718 Other rosacea: Secondary | ICD-10-CM | POA: Diagnosis not present

## 2016-04-29 DIAGNOSIS — L570 Actinic keratosis: Secondary | ICD-10-CM | POA: Diagnosis not present

## 2016-04-29 DIAGNOSIS — L814 Other melanin hyperpigmentation: Secondary | ICD-10-CM | POA: Diagnosis not present

## 2016-04-29 DIAGNOSIS — L821 Other seborrheic keratosis: Secondary | ICD-10-CM | POA: Diagnosis not present

## 2016-04-29 DIAGNOSIS — D1801 Hemangioma of skin and subcutaneous tissue: Secondary | ICD-10-CM | POA: Diagnosis not present

## 2016-04-29 DIAGNOSIS — Z85828 Personal history of other malignant neoplasm of skin: Secondary | ICD-10-CM | POA: Diagnosis not present

## 2016-05-13 DIAGNOSIS — E785 Hyperlipidemia, unspecified: Secondary | ICD-10-CM | POA: Insufficient documentation

## 2016-05-13 DIAGNOSIS — E782 Mixed hyperlipidemia: Secondary | ICD-10-CM | POA: Insufficient documentation

## 2016-05-13 NOTE — Progress Notes (Signed)
Subjective:    Anthony Juarez is a 70 y.o. male who presents for Medicare Annual/Subsequent preventive examination.   Preventive Screening-Counseling & Management  Tobacco History  Smoking Status  . Former Smoker  . Packs/day: 1.00  . Years: 10.00  . Quit date: 07/13/1977  Smokeless Tobacco  . Never Used    Problems Prior to Visit 1. AVM in brain w/ seizure d/o - seen by neurology Dr. Krista Blue annually, last 07/2015 well controlled on keppra XR 2.  HPL - stopped simvastatin last year as he was concerned it was causing some fatigue.  He has been on it for about 20 years prior. 3.  Jerrye Bushy - EGD 2012 by Dr. Benson Norway on nexium but now developing breakthrough sxs and pill dysphagia.  Intermittent 4.  ED - wife won't let him take diphospho-esterase inh and lack of intimacy is casing problems in their marriage. The ED is very longstanding - over a decade and pt reports his testosterone level has been checked sev times and always nml 5.   Central sleep apnea but couldn't tolerate BiPAP. Repeat sleep study in 2014 showed no need for cpap 6.. BPH with nocturia and urinary freq - no improvement on flomax, seen by Dr. Venia Minks at [facility] sev yr prior.  I saw pt 1 mo prior for sxs of chronic bacterial prostatis and started pt on bactrim bid x 1 mo and started pt on finasteride 5 and doxazosin 2. His urine retured showing enterococcus and due to reports of treatment failure on bactrim, I changed him to cipro 500 bid x 1 mo.  Is starting to have some reduced nocturia to 2 times a night.  Has not made appt with urology yet though was referred to [facility] 7.   Pectus excavatum - mild and of no clinical consequence 8.   H/o childhood asthma   Current Problems (verified) Patient Active Problem List   Diagnosis Date Noted  . Esophageal reflux 03/28/2015  . Shortness of breath 03/28/2015  . Skin lesion of face 03/28/2015  . AVM (arteriovenous malformation) brain 06/15/2013  . Seizure disorder  ([place]) 01/19/2013  . Central sleep apnea 06/10/2012    Medications Prior to Visit Current Outpatient Prescriptions on File Prior to Visit  Medication Sig Dispense Refill  . Albuterol Sulfate (PROAIR RESPICLICK) 123XX123 (90 Base) MCG/ACT AEPB Inhale 2 puffs into the lungs every 4 (four) hours as needed. 1 each 2  . aspirin 81 MG tablet Take 81 mg by mouth daily.     . calcium citrate-vitamin D (CITRACAL+D) 315-200 MG-UNIT tablet Take 1 tablet by mouth 2 (two) times daily.    . ciprofloxacin (CIPRO) 500 MG tablet Take 1 tablet (500 mg total) by mouth 2 (two) times daily. 60 tablet 0  . doxazosin (CARDURA) 2 MG tablet Take 1 tablet (2 mg total) by mouth daily. 30 tablet 1  . esomeprazole (NEXIUM) 20 MG packet Take 20 mg by mouth daily before breakfast. 30 each 12  . finasteride (PROSCAR) 5 MG tablet Take 1 tablet (5 mg total) by mouth daily. 30 tablet 5  . Levetiracetam 750 MG TB24 Take 1 tablet (750 mg total) by mouth daily. 90 tablet 3  . MAGNESIUM CITRATE PO Take 150 mg by mouth daily.     . montelukast (SINGULAIR) 10 MG tablet Take 1 tablet (10 mg total) by mouth at bedtime. 90 tablet 3  . Multiple Vitamins-Minerals (MULTIVITAMIN WITH MINERALS) tablet Take 1 tablet by mouth daily.    . polyethylene glycol  powder (GLYCOLAX/MIRALAX) powder Take 17 g by mouth 2 (two) times daily as needed. 259 g 11  . tadalafil (CIALIS) 5 MG tablet Take 1 tablet (5 mg total) by mouth daily. 30 tablet 5   No current facility-administered medications on file prior to visit.     Current Medications (verified) Current Outpatient Prescriptions  Medication Sig Dispense Refill  . Albuterol Sulfate (PROAIR RESPICLICK) 123XX123 (90 Base) MCG/ACT AEPB Inhale 2 puffs into the lungs every 4 (four) hours as needed. 1 each 2  . aspirin 81 MG tablet Take 81 mg by mouth daily.     . calcium citrate-vitamin D (CITRACAL+D) 315-200 MG-UNIT tablet Take 1 tablet by mouth 2 (two) times daily.    . ciprofloxacin (CIPRO) 500 MG tablet  Take 1 tablet (500 mg total) by mouth 2 (two) times daily. 60 tablet 0  . doxazosin (CARDURA) 2 MG tablet Take 1 tablet (2 mg total) by mouth daily. 30 tablet 1  . esomeprazole (NEXIUM) 20 MG packet Take 20 mg by mouth daily before breakfast. 30 each 12  . finasteride (PROSCAR) 5 MG tablet Take 1 tablet (5 mg total) by mouth daily. 30 tablet 5  . Levetiracetam 750 MG TB24 Take 1 tablet (750 mg total) by mouth daily. 90 tablet 3  . MAGNESIUM CITRATE PO Take 150 mg by mouth daily.     . montelukast (SINGULAIR) 10 MG tablet Take 1 tablet (10 mg total) by mouth at bedtime. 90 tablet 3  . Multiple Vitamins-Minerals (MULTIVITAMIN WITH MINERALS) tablet Take 1 tablet by mouth daily.    . polyethylene glycol powder (GLYCOLAX/MIRALAX) powder Take 17 g by mouth 2 (two) times daily as needed. 259 g 11  . tadalafil (CIALIS) 5 MG tablet Take 1 tablet (5 mg total) by mouth daily. 30 tablet 5   No current facility-administered medications for this visit.      Allergies (verified) Penicillins   PAST HISTORY  Family History Family History  Problem Relation Age of Onset  . Heart disease Father     cabg 49's  . Cancer Father     Prostate  . Heart disease Maternal Grandfather     Late 43's    Social History Social History  Substance Use Topics  . Smoking status: Former Smoker    Packs/day: 1.00    Years: 10.00    Quit date: 07/13/1977  . Smokeless tobacco: Never Used  . Alcohol use 1.2 oz/week    2 Cans of beer per week    Are there smokers in your home (other than you)?  No  Risk Factors Current exercise habits: Home exercise routine includes going to the gym 3x/wk.[place] Kitchen weights and bike. Dietary issues discussed: heart healthy, olive oil, farmer's   Cardiac risk factors: advanced age (older than 89 for men, 59 for women), dyslipidemia and male gender.  Depression Screen Depression screen [facility] 2/9 05/14/2016 04/11/2016 09/02/2015 08/21/2015 03/28/2015  Decreased Interest 0 0 0 0 0  Down,  Depressed, Hopeless 0 0 0 0 0  PHQ - 2 Score 0 0 0 0 0   Activities of Daily Living In your present state of health, do you have any difficulty performing the following activities?:  Driving? No Managing money?  No Feeding yourself? No Getting from bed to chair? No Climbing a flight of stairs? No Preparing food and eating?: No Bathing or showering? No Getting dressed: No Getting to the toilet? No Using the toilet:No Moving around from place to place: No In the past  year have you fallen or had a near fall?:No   Are you sexually active?  No  Do you have more than one partner?  No  Hearing Difficulties: Yes - wonders if left ear losing a little and a little with ambient noid\ce Do you often ask people to speak up or repeat themselves? No Do you experience ringing or noises in your ears? No Do you have difficulty understanding soft or whispered voices? Yes   Do you feel that you have a problem with memory? No  Do you often misplace items? No  Do you feel safe at home?  Yes  Cognitive Testing  Alert? Yes  Normal Appearance?Yes  Oriented to person? Yes  Place? Yes   Time? Yes  Recall of three objects?  Yes  Can perform simple calculations? Yes  Displays appropriate judgment?Yes  Can read the correct time from a watch face?Yes   Advanced Directives have been discussed with the patient? Yes  Wife Orlando Penner is HCPOA - did other people as part of will.   List the Names of Other Physician/Practitioners you currently use: 1.   GI - Dr. Benson Norway 2.  Derm - Dr. Allyson Sabal - Dr. Redmond Pulling, [facility] 3.  Urology - [facility] 4.   Cardiology - Dr. Marlou Porch 5.   Neurology - Dr. Krista Blue 6.   Sleep - Dr. Gwenette Greet - seen once in 2014  Indicate any recent Medical Services you may have received from other than [facility] providers in the past year (date may be approximate).  Immunization History  Administered Date(s) Administered  . DTaP 01/15/2011  . Influenza Whole 05/22/2011, 04/12/2012  .  Influenza,inj,Quad PF,36+ Mos 03/28/2015  . Pneumococcal Conjugate-13 03/28/2015  . Pneumococcal Polysaccharide-23 01/12/2014  . Zoster 01/29/2014    Screening Tests Health Maintenance  Topic Date Due  . TETANUS/TDAP  04/11/1967  . INFLUENZA VACCINE  02/11/2016  . COLONOSCOPY  03/25/2021  . ZOSTAVAX  Completed  . Hepatitis C Screening  Completed  . PNA vac Low Risk Adult  Completed    All answers were reviewed with the patient and necessary referrals were made:  Anvith Mauriello, MD   05/13/2016   History reviewed: allergies, current medications, past family history, past medical history, past social history, past surgical history and problem list  Review of Systems Pertinent items noted in HPI and remainder of comprehensive ROS otherwise negative.    Objective:  BP 116/72 (BP Location: Left Arm, Patient Position: Sitting, Cuff Size: Normal)   Pulse (!) 58   Temp 97.5 F (36.4 C) (Oral)   Resp 16   Ht 5' 8.5" (1.74 m)   Wt 170 lb 6.4 oz (77.3 kg)   SpO2 99%   BMI 25.53 kg/m   Visual Acuity Screening   Right eye Left eye Both eyes  Without correction:     With correction: 20/20 20/20 20/20     BP 116/72 (BP Location: Left Arm, Patient Position: Sitting, Cuff Size: Normal)   Pulse (!) 58   Temp 97.5 F (36.4 C) (Oral)   Resp 16   Ht 5' 8.5" (1.74 m)   Wt 170 lb 6.4 oz (77.3 kg)   SpO2 99%   BMI 25.53 kg/m   General Appearance:    Alert, cooperative, no distress, appears stated age  Head:    Normocephalic, without obvious abnormality, atraumatic  Eyes:    PERRL, conjunctiva/corneas clear, EOM's intact, fundi    benign, both eyes       Ears:    Normal external  ear canals, both ears, TMs retracted  Nose:   Nares normal, septum midline, mucosa normal, no drainage    or sinus tenderness  Throat:   Lips, and tongue normal; teeth and gums normal, + oropharyngeal erythema  Neck:   Supple, symmetrical, trachea midline, no adenopathy;       thyroid:  No  enlargement/tenderness/nodules; no carotid   bruit or JVD  Back:     Symmetric, no curvature, ROM normal, no CVA tenderness  Lungs:     Clear to auscultation bilaterally, respirations unlabored  Chest wall:    No tenderness or deformity  Heart:    Regular rate and rhythm, S1 and S2 normal, no murmur, rub   or gallop  Abdomen:     Soft, non-tender, bowel sounds active all four quadrants,    no masses, no organomegaly     Rectal:    Normal tone, boggy prostate, no masses or tenderness;   guaiac negative stool  Extremities:   Extremities normal, atraumatic, no cyanosis or edema  Pulses:   2+ and symmetric all extremities  Skin:   Skin color, texture, turgor normal, no rashes or lesions  Lymph nodes:   Cervical, supraclavicular, and axillary nodes normal  Neurologic:   CNII-XII intact. Normal strength, sensation and reflexes      throughout     Assessment:     1. Medicare annual wellness visit, subsequent   2. Screening for cardiovascular, respiratory, and genitourinary diseases   3. Benign prostatic hyperplasia with incomplete bladder emptying   4. Gastroesophageal reflux disease, esophagitis presence not specified   5. Chronic bacterial prostatitis   6. Erectile dysfunction, unspecified erectile dysfunction type   7. Seizure disorder ([place])   8. Hyperlipidemia, unspecified hyperlipidemia type   9. Need for prophylactic vaccination and inoculation against influenza   10. Laceration of right hand without foreign body, initial encounter   11. Medication monitoring encounter         Plan:  Flu shot  ua, microscopy, clx, lipids, cmp, tsh, testosterone During the course of the visit the patient was educated and counseled about appropriate screening and preventive services including:     Colonoscopy 2012 - 1 polyp and asymptomatic hemorrhoids found. Repeat in 5 years - now.  Immunizations: [id number] 2016, pneumovax-23 2015, zostavax 2015, needs TETANUS and FLU SHOT  Prostate -  seen by urology prior, psa last mo nml at 0.8  EKG - 03/2015; echo and POET done 2016  Couldn't get tsh covered.  Consider checking next year   Diet review for nutrition referral? Yes ____  Not Indicated _x___   Patient Instructions (the written plan) was given to the patient.  Over 25 min spent in face-to-face evaluation of and consultation with patient and coordination of care.  Over 50% of this time was spent counseling this patient.  Medicare Attestation I have personally reviewed: The patient's medical and social history Their use of alcohol, tobacco or illicit drugs Their current medications and supplements The patient's functional ability including ADLs,fall risks, home safety risks, cognitive, and hearing and visual impairment Diet and physical activities Evidence for depression or mood disorders  The patient's weight, height, BMI, and visual acuity have been recorded in the chart.  I have made referrals, counseling, and provided education to the patient based on review of the above and I have provided the patient with a written personalized care plan for preventive services.     Delman Cheadle, MD   05/13/2016    Orders Placed This  Encounter  Procedures  . Urine culture  . Flu Vaccine QUAD 36+ mos IM  . Td vaccine greater than or equal to 7yo preservative free IM  . Urinalysis, microscopic only  . Lipid panel    Order Specific Question:   Has the patient fasted?    Answer:   Yes  . Comprehensive metabolic panel    Order Specific Question:   Has the patient fasted?    Answer:   Yes  . Testosterone Total,Free,Bio, Males  . PSA  . Care order/instruction:    AVS and GO    Scheduling Instructions:     AVS and GO  . POCT urinalysis dipstick    Meds ordered this encounter  Medications  . doxazosin (CARDURA) 2 MG tablet    Sig: Take 1 tablet (2 mg total) by mouth daily.    Dispense:  90 tablet    Refill:  3  . ciprofloxacin (CIPRO) 500 MG tablet    Sig: Take 1  tablet (500 mg total) by mouth 2 (two) times daily.    Dispense:  60 tablet    Refill:  0  . omeprazole (PRILOSEC) 40 MG capsule    Sig: Take 1 capsule (40 mg total) by mouth daily.    Dispense:  90 capsule    Refill:  3     Delman Cheadle, M.D.  [facility] [place] [address] [place],  [address] [phone] phone [phone] fax  06/14/16 10:37 AM

## 2016-05-14 ENCOUNTER — Encounter: Payer: Self-pay | Admitting: Family Medicine

## 2016-05-14 ENCOUNTER — Ambulatory Visit (INDEPENDENT_AMBULATORY_CARE_PROVIDER_SITE_OTHER): Payer: Medicare Other | Admitting: Family Medicine

## 2016-05-14 VITALS — BP 116/72 | HR 58 | Temp 97.5°F | Resp 16 | Ht 68.5 in | Wt 170.4 lb

## 2016-05-14 DIAGNOSIS — N401 Enlarged prostate with lower urinary tract symptoms: Secondary | ICD-10-CM | POA: Diagnosis not present

## 2016-05-14 DIAGNOSIS — S61411A Laceration without foreign body of right hand, initial encounter: Secondary | ICD-10-CM

## 2016-05-14 DIAGNOSIS — N529 Male erectile dysfunction, unspecified: Secondary | ICD-10-CM | POA: Diagnosis not present

## 2016-05-14 DIAGNOSIS — Z23 Encounter for immunization: Secondary | ICD-10-CM | POA: Diagnosis not present

## 2016-05-14 DIAGNOSIS — Z136 Encounter for screening for cardiovascular disorders: Secondary | ICD-10-CM | POA: Diagnosis not present

## 2016-05-14 DIAGNOSIS — K219 Gastro-esophageal reflux disease without esophagitis: Secondary | ICD-10-CM | POA: Diagnosis not present

## 2016-05-14 DIAGNOSIS — G40909 Epilepsy, unspecified, not intractable, without status epilepticus: Secondary | ICD-10-CM

## 2016-05-14 DIAGNOSIS — R3914 Feeling of incomplete bladder emptying: Secondary | ICD-10-CM | POA: Diagnosis not present

## 2016-05-14 DIAGNOSIS — Z1383 Encounter for screening for respiratory disorder NEC: Secondary | ICD-10-CM | POA: Diagnosis not present

## 2016-05-14 DIAGNOSIS — E785 Hyperlipidemia, unspecified: Secondary | ICD-10-CM | POA: Diagnosis not present

## 2016-05-14 DIAGNOSIS — Z0000 Encounter for general adult medical examination without abnormal findings: Secondary | ICD-10-CM | POA: Diagnosis not present

## 2016-05-14 DIAGNOSIS — Z5181 Encounter for therapeutic drug level monitoring: Secondary | ICD-10-CM | POA: Diagnosis not present

## 2016-05-14 DIAGNOSIS — Z1389 Encounter for screening for other disorder: Secondary | ICD-10-CM

## 2016-05-14 DIAGNOSIS — N411 Chronic prostatitis: Secondary | ICD-10-CM

## 2016-05-14 LAB — URINALYSIS, MICROSCOPIC ONLY
BACTERIA UA: NONE SEEN [HPF]
CASTS: NONE SEEN [LPF]
Crystals: NONE SEEN [HPF]
RBC / HPF: NONE SEEN RBC/HPF (ref <=2)
SQUAMOUS EPITHELIAL / LPF: NONE SEEN [HPF] (ref <=5)
WBC, UA: NONE SEEN WBC/HPF (ref <=5)
Yeast: NONE SEEN [HPF]

## 2016-05-14 LAB — POCT URINALYSIS DIP (MANUAL ENTRY)
Bilirubin, UA: NEGATIVE
Glucose, UA: NEGATIVE
Ketones, POC UA: NEGATIVE
LEUKOCYTES UA: NEGATIVE
NITRITE UA: NEGATIVE
PH UA: 6.5
PROTEIN UA: NEGATIVE
Spec Grav, UA: 1.01
UROBILINOGEN UA: 0.2

## 2016-05-14 LAB — COMPREHENSIVE METABOLIC PANEL
ALT: 11 U/L (ref 9–46)
AST: 23 U/L (ref 10–35)
Albumin: 4.2 g/dL (ref 3.6–5.1)
Alkaline Phosphatase: 52 U/L (ref 40–115)
BUN: 13 mg/dL (ref 7–25)
CHLORIDE: 106 mmol/L (ref 98–110)
CO2: 25 mmol/L (ref 20–31)
CREATININE: 1.25 mg/dL (ref 0.70–1.25)
Calcium: 9.6 mg/dL (ref 8.6–10.3)
Glucose, Bld: 98 mg/dL (ref 65–99)
Potassium: 4.8 mmol/L (ref 3.5–5.3)
SODIUM: 141 mmol/L (ref 135–146)
TOTAL PROTEIN: 6.7 g/dL (ref 6.1–8.1)
Total Bilirubin: 0.9 mg/dL (ref 0.2–1.2)

## 2016-05-14 LAB — LIPID PANEL
CHOL/HDL RATIO: 2.7 ratio (ref <=5.0)
Cholesterol: 248 mg/dL — ABNORMAL HIGH (ref 125–200)
HDL: 92 mg/dL (ref >=40)
LDL CALC: 138 mg/dL — AB (ref <130)
Triglycerides: 90 mg/dL (ref <150)
VLDL: 18 mg/dL (ref <30)

## 2016-05-14 LAB — PSA: PSA: 0.4 ng/mL (ref <=4.0)

## 2016-05-14 MED ORDER — CIPROFLOXACIN HCL 500 MG PO TABS: 500.0000 mg | ORAL_TABLET | Freq: Two times a day (BID) | ORAL | 0 refills | Status: DC

## 2016-05-14 MED ORDER — DOXAZOSIN MESYLATE 2 MG PO TABS: 2.0000 mg | ORAL_TABLET | Freq: Every day | ORAL | 3 refills | Status: DC

## 2016-05-14 MED ORDER — OMEPRAZOLE 40 MG PO CPDR: 40.0000 mg | DELAYED_RELEASE_CAPSULE | Freq: Every day | ORAL | 3 refills | Status: DC

## 2016-05-14 NOTE — Patient Instructions (Addendum)
IF you received an x-ray today, you will receive an invoice from [facility]. Please contact [facility] at [phone] with questions or concerns regarding your invoice.   IF you received labwork today, you will receive an invoice from [employer]. Please contact [facility] at [phone] with questions or concerns regarding your invoice.   Our billing staff will not be able to assist you with questions regarding bills from these companies.  You will be contacted with the lab results as soon as they are available. The fastest way to get your results is to activate your My Chart account. Instructions are located on the last page of this paperwork. If you have not heard from [place] regarding the results in 2 weeks, please contact this office.     Anthony Juarez , Thank you for taking time to come for your Medicare Wellness Visit. I appreciate your ongoing commitment to your health goals. Please review the following plan we discussed and let me know if I can assist you in the future.   These are the goals we discussed: Goals    None      This is a list of the screening recommended for you and due dates:  Health Maintenance  Topic Date Due  . Tetanus Vaccine  04/11/1967  . Flu Shot  02/11/2016  . Colon Cancer Screening  03/25/2021  . Shingles Vaccine  Completed  .  Hepatitis C: One time screening is recommended by Center for Disease Control  (CDC) for  adults born from [birth year] through 1965.   Completed  . Pneumonia vaccines  Completed  Health Maintenance, Male A healthy lifestyle and preventative care can promote health and wellness.  Maintain regular health, dental, and eye exams.  Eat a healthy diet. Foods like vegetables, fruits, whole grains, low-fat dairy products, and lean protein foods contain the nutrients you need and are low in calories. Decrease your intake of foods high in solid fats, added sugars, and salt. Get information about a  proper diet from your health care provider, if necessary.  Regular physical exercise is one of the most important things you can do for your health. Most adults should get at least 150 minutes of moderate-intensity exercise (any activity that increases your heart rate and causes you to sweat) each week. In addition, most adults need muscle-strengthening exercises on 2 or more days a week.   Maintain a healthy weight. The body mass index (BMI) is a screening tool to identify possible weight problems. It provides an estimate of body fat based on height and weight. Your health care provider can find your BMI and can help you achieve or maintain a healthy weight. For males 20 years and older:  A BMI below 18.5 is considered underweight.  A BMI of 18.5 to 24.9 is normal.  A BMI of 25 to 29.9 is considered overweight.  A BMI of 30 and above is considered obese.  Maintain normal blood lipids and cholesterol by exercising and minimizing your intake of saturated fat. Eat a balanced diet with plenty of fruits and vegetables. Blood tests for lipids and cholesterol should begin at age 24 and be repeated every 5 years. If your lipid or cholesterol levels are high, you are over age 65, or you are at high risk for heart disease, you may need your cholesterol levels checked more frequently.Ongoing high lipid and cholesterol levels should be treated with medicines if diet and exercise are not working.  If you smoke,  find out from your health care provider how to quit. If you do not use tobacco, do not start.  Lung cancer screening is recommended for adults aged 7-80 years who are at high risk for developing lung cancer because of a history of smoking. A yearly low-dose CT scan of the lungs is recommended for people who have at least a 30-pack-year history of smoking and are current smokers or have quit within the past 15 years. A pack year of smoking is smoking an average of 1 pack of cigarettes a day for 1 year  (for example, a 30-pack-year history of smoking could mean smoking 1 pack a day for 30 years or 2 packs a day for 15 years). Yearly screening should continue until the smoker has stopped smoking for at least 15 years. Yearly screening should be stopped for people who develop a health problem that would prevent them from having lung cancer treatment.  If you choose to drink alcohol, do not have more than 2 drinks per day. One drink is considered to be 12 oz (360 mL) of beer, 5 oz (150 mL) of wine, or 1.5 oz (45 mL) of liquor.  Avoid the use of street drugs. Do not share needles with anyone. Ask for help if you need support or instructions about stopping the use of drugs.  High blood pressure causes heart disease and increases the risk of stroke. High blood pressure is more likely to develop in:  People who have blood pressure in the end of the normal range (100-139/85-89 mm Hg).  People who are overweight or obese.  People who are African American.  If you are 58-46 years of age, have your blood pressure checked every 3-5 years. If you are 83 years of age or older, have your blood pressure checked every year. You should have your blood pressure measured twice--once when you are at a hospital or clinic, and once when you are not at a hospital or clinic. Record the average of the two measurements. To check your blood pressure when you are not at a hospital or clinic, you can use:  An automated blood pressure machine at a pharmacy.  A home blood pressure monitor.  If you are 59-3 years old, ask your health care provider if you should take aspirin to prevent heart disease.  Diabetes screening involves taking a blood sample to check your fasting blood sugar level. This should be done once every 3 years after age 50 if you are at a normal weight and without risk factors for diabetes. Testing should be considered at a younger age or be carried out more frequently if you are overweight and have at  least 1 risk factor for diabetes.  Colorectal cancer can be detected and often prevented. Most routine colorectal cancer screening begins at the age of 59 and continues through age 32. However, your health care provider may recommend screening at an earlier age if you have risk factors for colon cancer. On a yearly basis, your health care provider may provide home test kits to check for hidden blood in the stool. A small camera at the end of a tube may be used to directly examine the colon (sigmoidoscopy or colonoscopy) to detect the earliest forms of colorectal cancer. Talk to your health care provider about this at age 31 when routine screening begins. A direct exam of the colon should be repeated every 5-10 years through age 80, unless early forms of precancerous polyps or small growths are found.  People who are at an increased risk for hepatitis B should be screened for this virus. You are considered at high risk for hepatitis B if:  You were born in a country where hepatitis B occurs often. Talk with your health care provider about which countries are considered high risk.  Your parents were born in a high-risk country and you have not received a shot to protect against hepatitis B (hepatitis B vaccine).  You have HIV or AIDS.  You use needles to inject street drugs.  You live with, or have sex with, someone who has hepatitis B.  You are a man who has sex with other men (MSM).  You get hemodialysis treatment.  You take certain medicines for conditions like cancer, organ transplantation, and autoimmune conditions.  Hepatitis C blood testing is recommended for all people born from [birth year] through 1965 and any individual with known risk factors for hepatitis C.  Healthy men should no longer receive prostate-specific antigen (PSA) blood tests as part of routine cancer screening. Talk to your health care provider about prostate cancer screening.  Testicular cancer screening is not recommended  for adolescents or adult males who have no symptoms. Screening includes self-exam, a health care provider exam, and other screening tests. Consult with your health care provider about any symptoms you have or any concerns you have about testicular cancer.  Practice safe sex. Use condoms and avoid high-risk sexual practices to reduce the spread of sexually transmitted infections (STIs).  You should be screened for STIs, including gonorrhea and chlamydia if:  You are sexually active and are younger than 24 years.  You are older than 24 years, and your health care provider tells you that you are at risk for this type of infection.  Your sexual activity has changed since you were last screened, and you are at an increased risk for chlamydia or gonorrhea. Ask your health care provider if you are at risk.  If you are at risk of being infected with HIV, it is recommended that you take a prescription medicine daily to prevent HIV infection. This is called pre-exposure prophylaxis (PrEP). You are considered at risk if:  You are a man who has sex with other men (MSM).  You are a heterosexual man who is sexually active with multiple partners.  You take drugs by injection.  You are sexually active with a partner who has HIV.  Talk with your health care provider about whether you are at high risk of being infected with HIV. If you choose to begin PrEP, you should first be tested for HIV. You should then be tested every 3 months for as long as you are taking PrEP.  Use sunscreen. Apply sunscreen liberally and repeatedly throughout the day. You should seek shade when your shadow is shorter than you. Protect yourself by wearing long sleeves, pants, a wide-brimmed hat, and sunglasses year round whenever you are outdoors.  Tell your health care provider of new moles or changes in moles, especially if there is a change in shape or color. Also, tell your health care provider if a mole is larger than the size  of a pencil eraser.  A one-time screening for abdominal aortic aneurysm (AAA) and surgical repair of large AAAs by ultrasound is recommended for men aged 10-75 years who are current or former smokers.  Stay current with your vaccines (immunizations).   This information is not intended to replace advice given to you by your health care provider. Make sure you  discuss any questions you have with your health care provider.   Document Released: 12/26/2007 Document Revised: 07/20/2014 Document Reviewed: 11/24/2010 Elsevier Interactive Patient Education [employer].

## 2016-05-15 LAB — TESTOSTERONE TOTAL,FREE,BIO, MALES
Albumin: 4.1 g/dL (ref 3.6–5.1)
Sex Hormone Binding: 68 nmol/L (ref 22–77)
TESTOSTERONE FREE: 44.2 pg/mL — AB (ref 46.0–224.0)
Testosterone, Bioavailable: 83.1 ng/dL — ABNORMAL LOW (ref 110.0–575.0)
Testosterone: 604 ng/dL (ref 250–827)

## 2016-05-15 LAB — URINE CULTURE: ORGANISM ID, BACTERIA: NO GROWTH

## 2016-05-20 ENCOUNTER — Encounter: Payer: Self-pay | Admitting: Family Medicine

## 2016-08-22 ENCOUNTER — Other Ambulatory Visit: Payer: Self-pay | Admitting: Neurology

## 2016-11-11 DIAGNOSIS — N401 Enlarged prostate with lower urinary tract symptoms: Secondary | ICD-10-CM | POA: Insufficient documentation

## 2016-11-11 DIAGNOSIS — R3914 Feeling of incomplete bladder emptying: Secondary | ICD-10-CM

## 2016-11-11 NOTE — Progress Notes (Signed)
Subjective:    Patient ID: Anthony Juarez, male    DOB: Mar 08, 1946, 71 y.o.   MRN: [id number] Chief Complaint  Patient presents with  . Follow-up    UTI and per patient needs Flonase nasal spray    HPI  [name] is a 71 yo male here for a 6 mo follow-up on his chronic medical conditions.  1. AVM in brain w/ seizure d/o - seen by neurology Dr. Krista Blue annually, last 07/2015 well controlled on keppra XR 2.  HPL - stopped simvastatin 40 in 2016 after being on it for ~20 yrs as he was concerned it was causing some fatigue after which his ASCVD risk profile increased from 8.7 to 11.3%.   He is wondering about his HDL naturally being so high - he does not take any omega-3 supps. Have  3.  Jerrye Bushy - EGD 2012 by Dr. Benson [place]. Developed breakthrough sxs and pill dysphagia on nexium so changed to prilosec. Currently doing well other than belching. 4.  ED - wife won't let him take diphospho-esterase inh and lack of intimacy is casing problems in their marriage. The ED is very longstanding - over a decade and pt reports his testosterone level has been checked sev times and always nml 5.   Central sleep apnea but couldn't tolerate BiPAP. Repeat sleep study in 2014 showed no need for cpap 6.  BPH with nocturia and urinary freq - no improvement on flomax, seen by Dr. Venia Minks at [facility] sev yr prior. Treated pt for chronic bacterial prostatis Sept 2017 as well as started on finasteride 5 and doxazosin 2 after which nocturia decreased to 2 times a night. Free and bioavailable testosterone might have been borderline low but pt never followed up with [facility]. Currently no issues and he stopped the proscar and didn't notice any sig difference.  7.   Pectus excavatum - mild and of no clinical consequence 8.   H/o childhood asthma - prn albuterol. Singulair. Only uses  BM are - lots of gas - not regularly.  GOes several times a day. Feels bloated and incomplete.  Has the sensation of retained stool.  Does  a fiber supp of psyllium husk - miralax to potent with diarrhea. Does probiotic gummies.  mvi, mag, and probiotic gummies. Not to much cultured foods.  Belching more and more flatulence. Does swallow a lot of air.  9.    Past Medical History:  Diagnosis Date  . Arthritis   . Balanitis   . Benign prostatic hypertrophy   . Cholecystitis with cholelithiasis   . Convulsive disorder ([place])   . History of nuclear stress test    Myoview 11/16: EF 60%, normal perfusion, Low Risk  . Hyperlipidemia   . Hyperlipidemia   . OSA (obstructive sleep apnea)   . Prostatitis   . Seizures ([place])    Past Surgical History:  Procedure Laterality Date  . ELBOW SURGERY  11/11/2000   repair  . FRACTURE SURGERY     LEFT ELBOW  . TONSILLECTOMY     Current Outpatient Prescriptions on File Prior to Visit  Medication Sig Dispense Refill  . Albuterol Sulfate (PROAIR RESPICLICK) 621 (90 Base) MCG/ACT AEPB Inhale 2 puffs into the lungs every 4 (four) hours as needed. 1 each 2  . aspirin 81 MG tablet Take 81 mg by mouth daily.     . calcium citrate-vitamin D (CITRACAL+D) 315-200 MG-UNIT tablet Take 1 tablet by mouth 2 (two) times daily.    . finasteride (PROSCAR)  5 MG tablet Take 1 tablet (5 mg total) by mouth daily. 30 tablet 5  . Levetiracetam 750 MG TB24 Take 1 tablet (750 mg total) by mouth daily. Please call [phone] to schedule yearly follow up. 90 tablet 0  . MAGNESIUM CITRATE PO Take 150 mg by mouth daily.     . montelukast (SINGULAIR) 10 MG tablet Take 1 tablet (10 mg total) by mouth at bedtime. 90 tablet 3  . Multiple Vitamins-Minerals (MULTIVITAMIN WITH MINERALS) tablet Take 1 tablet by mouth daily.    . polyethylene glycol powder (GLYCOLAX/MIRALAX) powder Take 17 g by mouth 2 (two) times daily as needed. 259 g 11  . doxazosin (CARDURA) 2 MG tablet Take 1 tablet (2 mg total) by mouth daily. (Patient not taking: Reported on 11/12/2016) 90 tablet 3  . omeprazole (PRILOSEC) 40 MG capsule Take 1 capsule (40  mg total) by mouth daily. (Patient not taking: Reported on 11/12/2016) 90 capsule 3   No current facility-administered medications on file prior to visit.    Allergies  Allergen Reactions  . Penicillins Rash   Family History  Problem Relation Age of Onset  . Heart disease Father     cabg 28's  . Cancer Father     Prostate  . Heart disease Maternal Grandfather     Late 70's   Social History   Social History  . Marital status: Married    Spouse name: N/A  . Number of children: N/A  . Years of education: N/A   Occupational History  . retired Retired   Social History Main Topics  . Smoking status: Former Smoker    Packs/day: 1.00    Years: 10.00    Quit date: 07/13/1977  . Smokeless tobacco: Never Used  . Alcohol use 1.2 oz/week    2 Cans of beer per week  . Drug use: No  . Sexual activity: Not on file   Other Topics Concern  . Not on file   Social History Narrative  . No narrative on file   Depression screen [facility] 2/9 11/12/2016 05/14/2016 04/11/2016 09/02/2015 08/21/2015  Decreased Interest 0 0 0 0 0  Down, Depressed, Hopeless 0 0 0 0 0  PHQ - 2 Score 0 0 0 0 0    Review of Systems see hpi    Objective:   Physical Exam  Constitutional: He is oriented to person, place, and time. He appears well-developed and well-nourished. No distress.  HENT:  Head: Normocephalic and atraumatic.  Eyes: Conjunctivae are normal. Pupils are equal, round, and reactive to light. No scleral icterus.  Neck: Normal range of motion. Neck supple. No thyromegaly present.  Cardiovascular: Normal rate, regular rhythm, normal heart sounds and intact distal pulses.   Pulmonary/Chest: Effort normal and breath sounds normal. No respiratory distress.  Musculoskeletal: He exhibits no edema.  Lymphadenopathy:    He has no cervical adenopathy.  Neurological: He is alert and oriented to person, place, and time.  Skin: Skin is warm and dry. He is not diaphoretic.  Psychiatric: He has a normal mood and  affect. His behavior is normal.      BP 124/75 (BP Location: Right Arm, Patient Position: Sitting, Cuff Size: Normal)   Pulse (!) 54   Temp 98.3 F (36.8 C) (Oral)   Resp 16   Ht 5' 8.5" (1.74 m)   Wt 174 lb (78.9 kg)   SpO2 99%   BMI 26.07 kg/m      Assessment & Plan:  Lipid, cmp, ua,  Restart atorvastatin 20???? Vit b12, sh,  1. Hyperlipidemia, unspecified hyperlipidemia type   2. Benign prostatic hyperplasia with incomplete bladder emptying   3. Nocturia - at baseline, no improvement with proscar, encouraged pt to restart anyway, he will f/u with urology to discuss poss turp.  4. Memory change - very slight, pt will d/w neurology    Orders Placed This Encounter  Procedures  . Lipid panel    Order Specific Question:   Has the patient fasted?    Answer:   Yes  . Comprehensive metabolic panel    Order Specific Question:   Has the patient fasted?    Answer:   Yes  . CBC  . TSH  . Vitamin B12  . Apolipoprotein B  . High sensitivity CRP  . Care order/instruction:    Scheduling Instructions:     Complete orders, AVS and go.  Marland Kitchen POCT urinalysis dipstick    Meds ordered this encounter  Medications  . fluticasone (FLONASE) 50 MCG/ACT nasal spray    Sig: Place 2 sprays into both nostrils at bedtime.    Dispense:  16 g    Refill:  2    Delman Cheadle, M.D.  Primary Care at [facility] [address] [place], [place] [address] [phone] phone [phone] fax  11/14/16 11:56 PM

## 2016-11-12 ENCOUNTER — Ambulatory Visit (INDEPENDENT_AMBULATORY_CARE_PROVIDER_SITE_OTHER): Payer: Medicare Other | Admitting: Family Medicine

## 2016-11-12 VITALS — BP 124/75 | HR 54 | Temp 98.3°F | Resp 16 | Ht 68.5 in | Wt 174.0 lb

## 2016-11-12 DIAGNOSIS — R3914 Feeling of incomplete bladder emptying: Secondary | ICD-10-CM

## 2016-11-12 DIAGNOSIS — R351 Nocturia: Secondary | ICD-10-CM

## 2016-11-12 DIAGNOSIS — N401 Enlarged prostate with lower urinary tract symptoms: Secondary | ICD-10-CM

## 2016-11-12 DIAGNOSIS — R413 Other amnesia: Secondary | ICD-10-CM | POA: Diagnosis not present

## 2016-11-12 DIAGNOSIS — E785 Hyperlipidemia, unspecified: Secondary | ICD-10-CM

## 2016-11-12 LAB — POCT URINALYSIS DIP (MANUAL ENTRY)
BILIRUBIN UA: NEGATIVE
BILIRUBIN UA: NEGATIVE mg/dL
Blood, UA: NEGATIVE
Glucose, UA: NEGATIVE mg/dL
Leukocytes, UA: NEGATIVE
Nitrite, UA: NEGATIVE
PH UA: 7.5 (ref 5.0–8.0)
PROTEIN UA: NEGATIVE mg/dL
SPEC GRAV UA: 1.015 (ref 1.010–1.025)
Urobilinogen, UA: 0.2 E.U./dL

## 2016-11-12 MED ORDER — FLUTICASONE PROPIONATE 50 MCG/ACT NA SUSP: 2.0000 | Freq: Every day | NASAL | 2 refills | Status: DC

## 2016-11-12 NOTE — Patient Instructions (Addendum)
Consider trying to cut your fiber supplement and half. It's possible that the psyllium husk is producing way too much gas. Consider trying a much stronger probiotic such as ultraflora IB or Kefir.  Alternatively you may want to try to cut out the magnesium supplement is sometimes this could make stools seem too soft to fully pass. I would recommend that you make one of these changes and week 2 weeks to see the effect before starting the second. The UltraFlora IB is expensive but does seem to work much better than any other probiotic - you can get it at [facility] and if you would like a prescription to at least cut out the tax let me know.    IF you received an x-ray today, you will receive an invoice from [facility]. Please contact [facility] at [phone] with questions or concerns regarding your invoice.   IF you received labwork today, you will receive an invoice from [place]. Please contact [facility] at [phone] with questions or concerns regarding your invoice.   Our billing staff will not be able to assist you with questions regarding bills from these companies.  You will be contacted with the lab results as soon as they are available. The fastest way to get your results is to activate your My Chart account. Instructions are located on the last page of this paperwork. If you have not heard from [place] regarding the results in 2 weeks, please contact this office.    Introduction to Bowel Health Diet and daily habits can help you predict when your bowels will move on a regular basis.  The consistency and quantity of the stool is usually more important than the frequency.  The goal is to have a regular bowel movement that is soft but formed.   Tips on Emptying Regularly . Eat breakfast.  Usually the best time of day for a bowel movement will be a half hour to an hour after eating.  These times are best because the body uses the gastrocolic reflex, a stimulation of  bowel motion that occurs with eating, to help produce a bowel movement.  For some people even a simple hot drink in the morning can help the reflex action begin. . Eat all your meals at a predictable time each day.  The bowel functions best when food is introduced at the same regular intervals. . The amount of food eaten at a given time of day should be about the same size from day to day.  The bowel functions best when food is introduced in similar quantities from day to day. It is fine to have a small breakfast and a large lunch, or vice versa, just be consistent. . Eat two servings of fruit or vegetables and at least one serving of a complex carbohydrates (whole grains such as brown rice, bran, whole wheat bread, or oatmeal) at each meal. . Drink plenty of water-ideally eight glasses a day.  Be sure to increase your water intake if you are increasing fiber into your diet.  Maintain Healthy Habits . Exercise daily.  You may exercise at any time of day, but you may find that bowel function is helped most if the exercise is at a consistent time each day. . Make sure that you are not rushed and have convenient access to a bathroom at your selected time to empty your bowels.    2007, [employer] Doc.14

## 2016-11-13 LAB — LIPID PANEL
CHOL/HDL RATIO: 2.3 ratio (ref 0.0–5.0)
Cholesterol, Total: 232 mg/dL — ABNORMAL HIGH (ref 100–199)
HDL: 100 mg/dL (ref >39)
LDL Calculated: 120 mg/dL — ABNORMAL HIGH (ref 0–99)
Triglycerides: 58 mg/dL (ref 0–149)
VLDL CHOLESTEROL CAL: 12 mg/dL (ref 5–40)

## 2016-11-13 LAB — COMPREHENSIVE METABOLIC PANEL
ALBUMIN: 4.4 g/dL (ref 3.6–4.8)
ALK PHOS: 54 IU/L (ref 39–117)
ALT: 12 IU/L (ref 0–44)
AST: 24 IU/L (ref 0–40)
Albumin/Globulin Ratio: 2.1 (ref 1.2–2.2)
BUN / CREAT RATIO: 10 (ref 10–24)
BUN: 11 mg/dL (ref 8–27)
Bilirubin Total: 0.9 mg/dL (ref 0.0–1.2)
CHLORIDE: 100 mmol/L (ref 96–106)
CO2: 24 mmol/L (ref 18–29)
CREATININE: 1.06 mg/dL (ref 0.76–1.27)
Calcium: 9.3 mg/dL (ref 8.6–10.2)
GFR calc Af Amer: 83 mL/min/{1.73_m2} (ref >59)
GFR calc non Af Amer: 72 mL/min/{1.73_m2} (ref >59)
GLUCOSE: 89 mg/dL (ref 65–99)
Globulin, Total: 2.1 g/dL (ref 1.5–4.5)
Potassium: 4.8 mmol/L (ref 3.5–5.2)
Sodium: 138 mmol/L (ref 134–144)
TOTAL PROTEIN: 6.5 g/dL (ref 6.0–8.5)

## 2016-11-13 LAB — CBC
Hematocrit: 45.1 % (ref 37.5–51.0)
Hemoglobin: 15.2 g/dL (ref 13.0–17.7)
MCH: 30 pg (ref 26.6–33.0)
MCHC: 33.7 g/dL (ref 31.5–35.7)
MCV: 89 fL (ref 79–97)
Platelets: 269 10*3/uL (ref 150–379)
RBC: 5.06 x10E6/uL (ref 4.14–5.80)
RDW: 14.1 % (ref 12.3–15.4)
WBC: 6.3 10*3/uL (ref 3.4–10.8)

## 2016-11-13 LAB — HIGH SENSITIVITY CRP: CRP, High Sensitivity: 0.52 mg/L (ref 0.00–3.00)

## 2016-11-13 LAB — APOLIPOPROTEIN B: Apolipoprotein B: 113 mg/dL (ref 52–135)

## 2016-11-13 LAB — VITAMIN B12: Vitamin B-12: 815 pg/mL (ref 232–1245)

## 2016-11-13 LAB — TSH: TSH: 2.73 u[IU]/mL (ref 0.450–4.500)

## 2016-12-03 ENCOUNTER — Encounter: Payer: Self-pay | Admitting: Neurology

## 2016-12-03 ENCOUNTER — Ambulatory Visit (INDEPENDENT_AMBULATORY_CARE_PROVIDER_SITE_OTHER): Payer: Medicare Other | Admitting: Neurology

## 2016-12-03 VITALS — BP 124/81 | HR 62 | Ht 68.5 in | Wt 175.0 lb

## 2016-12-03 DIAGNOSIS — R9089 Other abnormal findings on diagnostic imaging of central nervous system: Secondary | ICD-10-CM | POA: Diagnosis not present

## 2016-12-03 DIAGNOSIS — Q282 Arteriovenous malformation of cerebral vessels: Secondary | ICD-10-CM

## 2016-12-03 DIAGNOSIS — G40909 Epilepsy, unspecified, not intractable, without status epilepticus: Secondary | ICD-10-CM

## 2016-12-03 MED ORDER — LEVETIRACETAM ER 750 MG PO TB24: 750.0000 mg | ORAL_TABLET | Freq: Every day | ORAL | 4 refills | Status: DC

## 2016-12-03 NOTE — Progress Notes (Addendum)
PATIENT: Anthony Juarez DOB: 1945-12-05  Chief Complaint  Patient presents with  . Seizures    He is here for his yearly follow up. No seizure activity reported.    . Memory Loss    MMSE 30/30 - 27 animals.  He has noticed difficulty with memory and forgetfulness for the last few months.     HISTORICAL  Anthony Juarez is a 71 yo WM, following up for seizure disorder  He has PMHx of seizure since infant, he was treated with dilantin and phenobarbital for a long time, was eventurally tapered off after seizure free since age 30, he had one recurrent seizure at age 33, was put back on dilantin 400mg  qhs, has been on it since.  He had Bachelor's degree, retired as Location manager, Insurance underwriter  in 2009, moved to [place] about 2.5 years ago, now he stays at home most of time, rarely initiated activities, felt fatigue, difficulty concontrating, getting worse since summer of 2013.  His wife complains that he is not up to his house projects anymore, frequent nocturia, which has prompted recent treatment with desmopression, which helped his frequent night time awaking,  He also has sleep apnea, but could not tolerate his BiPAP machine.He is seen by Dr. Gwenette Greet.  Dilantin level was 32 while he was taking Dilantin 100 mg 4 tablets each night, level decreased to 22 while he was taking Dilantin 100 mg 3 tablets each night, he has developed gingival hypertrophy, his brain fogginess has much improved with decreased dose of Dilantin, especially after he stopped taking Desopressin,   MRI scan of the brain showed a cavernous angioma with remote age hemorrhage in the right frontal subcortical region with  and adjacent  venous angioma.  There are moderate changes of chronic microvascular ischemia and mild degree of generalized cerebral atrophy.  Followup visit today patient has been switched to Keppra XR 750mg  daily. He is off Dilantin totally and his brain fogginess has improved. He has not  had further seizure activity.  UPDATE Jan 19th 2015: He is now taking keppra xr 750mg  qhs, last seizure 1992, nocturnal seizure,  he only has mild fatigue, no longer having brain foggy sensation We have reviewed MRI taking January 2015, continue the right inferior frontal cavernous venous angioma, mild atrophy, moderate small vessel disease,  UPDATE Jan 19th 2016; I have reviewed MRI brain (with and without): right frontal juxtacortical T2 heterogenous lesion (1.4x1.0cm), with "popcorn" appearance, consistent with cavernous malformation. There is an associated small developmental venous anomaly.  Multiple round and ovoid, periventricular, subcortical and juxtacortical T2 hyperintense foci. These findings are non-specific and considerations include autoimmune, inflammatory, post-infectious, microvascular ischemic or migraine associated etiologies.   He is taking Keppra XR 750 mg every night, there was no recurrent seizure  UPDATE Aug 01 2015: He had sleep study recently,  No need for CPAP machine, he is taking keppra xr 750mg  qhs, no recurrent seizure.   We have reviewed MRI of the brain with and without contrast in January 2017: 1. A focus of heterogenous signal in the superficial right frontal lobe with a hemosiderin rim and adjacent venous anomaly. This has characteristics of a cavernous malformation. Compared to the 07/20/2013 MRI, there has been no definite change. 2. Scattered T2/FLAIR hyperintense foci, predominantly in the subcortical deep white matter of both hemispheres. This is stable compared to the prior MRI. She is a nonspecific finding and potential etiology could be chronic microvascular ischemic change, migraine, chronic demyelination.  REVIEW OF SYSTEMS: Full 14 system review of systems performed and notable only for as above  ALLERGIES: Allergies  Allergen Reactions  . Penicillins Rash    HOME MEDICATIONS: Current Outpatient Prescriptions  Medication Sig  Dispense Refill  . Albuterol Sulfate (PROAIR RESPICLICK) 169 (90 Base) MCG/ACT AEPB Inhale 2 puffs into the lungs every 4 (four) hours as needed. 1 each 2  . aspirin 81 MG tablet Take 81 mg by mouth daily.     Marland Kitchen doxazosin (CARDURA) 2 MG tablet Take 1 tablet (2 mg total) by mouth daily. 90 tablet 3  . finasteride (PROSCAR) 5 MG tablet Take 1 tablet (5 mg total) by mouth daily. 30 tablet 5  . fluticasone (FLONASE) 50 MCG/ACT nasal spray Place 2 sprays into both nostrils at bedtime. 16 g 2  . Levetiracetam 750 MG TB24 Take 1 tablet (750 mg total) by mouth daily. Please call [phone] to schedule yearly follow up. 90 tablet 0  . montelukast (SINGULAIR) 10 MG tablet Take 1 tablet (10 mg total) by mouth at bedtime. 90 tablet 3  . Multiple Vitamins-Minerals (MULTIVITAMIN WITH MINERALS) tablet Take 1 tablet by mouth daily.    . polyethylene glycol powder (GLYCOLAX/MIRALAX) powder Take 17 g by mouth 2 (two) times daily as needed. 259 g 11   No current facility-administered medications for this visit.     PAST MEDICAL HISTORY: Past Medical History:  Diagnosis Date  . Arthritis   . Balanitis   . Benign prostatic hypertrophy   . Cholecystitis with cholelithiasis   . Convulsive disorder ([place])   . History of nuclear stress test    Myoview 11/16: EF 60%, normal perfusion, Low Risk  . Hyperlipidemia   . Hyperlipidemia   . OSA (obstructive sleep apnea)   . Prostatitis   . Seizures ([place])     PAST SURGICAL HISTORY: Past Surgical History:  Procedure Laterality Date  . ELBOW SURGERY  11/11/2000   repair  . FRACTURE SURGERY     LEFT ELBOW  . TONSILLECTOMY      FAMILY HISTORY: Family History  Problem Relation Age of Onset  . Heart disease Father        cabg 103's  . Cancer Father        Prostate  . Heart disease Maternal Grandfather        Late 64's    SOCIAL HISTORY:  Social History   Social History  . Marital status: Married    Spouse name: N/A  . Number of children: N/A  . Years  of education: N/A   Occupational History  . retired Retired   Social History Main Topics  . Smoking status: Former Smoker    Packs/day: 1.00    Years: 10.00    Quit date: 07/13/1977  . Smokeless tobacco: Never Used  . Alcohol use 1.2 oz/week    2 Cans of beer per week  . Drug use: No  . Sexual activity: Not on file   Other Topics Concern  . Not on file   Social History Narrative  . No narrative on file     PHYSICAL EXAM   Vitals:   12/03/16 1137  BP: 124/81  Pulse: 62  Weight: 175 lb (79.4 kg)  Height: 5' 8.5" (1.74 m)    Not recorded      Body mass index is 26.22 kg/m.  PHYSICAL EXAMNIATION:  Gen: NAD, conversant, well nourised, obese, well groomed  Cardiovascular: Regular rate rhythm, no peripheral edema, warm, nontender. Eyes: Conjunctivae clear without exudates or hemorrhage Neck: Supple, no carotid bruise. Pulmonary: Clear to auscultation bilaterally   NEUROLOGICAL EXAM:  MMSE - Mini Mental State Exam 12/03/2016  Orientation to time 5  Orientation to Place 5  Registration 3  Attention/ Calculation 5  Recall 3  Language- name 2 objects 2  Language- repeat 1  Language- follow 3 step command 3  Language- read & follow direction 1  Write a sentence 1  Copy design 1  Total score 30   Animal naming 27  CRANIAL NERVES: CN II: Visual fields are full to confrontation. Fundoscopic exam is normal with sharp discs and no vascular changes. Pupils are round equal and briskly reactive to light. CN III, IV, VI: extraocular movement are normal. No ptosis. CN V: Facial sensation is intact to pinprick in all 3 divisions bilaterally. Corneal responses are intact.  CN VII: Face is symmetric with normal eye closure and smile. CN VIII: Hearing is normal to rubbing fingers CN IX, X: Palate elevates symmetrically. Phonation is normal. CN XI: Head turning and shoulder shrug are intact CN XII: Tongue is midline with normal movements and no  atrophy.  MOTOR: There is no pronator drift of out-stretched arms. Muscle bulk and tone are normal. Muscle strength is normal.  REFLEXES: Reflexes are 2+ and symmetric at the biceps, triceps, knees, and ankles. Plantar responses are flexor.  SENSORY: Intact to light touch, pinprick, position sense, and vibration sense are intact in fingers and toes.  COORDINATION: Rapid alternating movements and fine finger movements are intact. There is no dysmetria on finger-to-nose and heel-knee-shin.    GAIT/STANCE: Posture is normal. Gait is steady with normal steps, base, arm swing, and turning. Heel and toe walking are normal. Tandem gait is normal.  Romberg is absent.   DIAGNOSTIC DATA (LABS, IMAGING, TESTING) - I reviewed patient records, labs, notes, testing and imaging myself where available.   ASSESSMENT AND PLAN  Anthony Juarez is a 71 y.o. male   Epilepsy  Doing well on Keppra XR 750 mg daily  Right frontal cavernous angioma, supratentorium small vessel disease  Stable   Marcial Pacas, M.D. Ph.D.  [facility] [address] [place], [place] [address] Ph: [phone] Fax: [phone]  CC: Referring Provider

## 2016-12-04 DIAGNOSIS — R9089 Other abnormal findings on diagnostic imaging of central nervous system: Secondary | ICD-10-CM | POA: Insufficient documentation

## 2016-12-08 ENCOUNTER — Other Ambulatory Visit: Payer: Self-pay | Admitting: Family Medicine

## 2017-01-14 DIAGNOSIS — H353132 Nonexudative age-related macular degeneration, bilateral, intermediate dry stage: Secondary | ICD-10-CM | POA: Diagnosis not present

## 2017-01-14 DIAGNOSIS — H43392 Other vitreous opacities, left eye: Secondary | ICD-10-CM | POA: Diagnosis not present

## 2017-03-20 ENCOUNTER — Other Ambulatory Visit: Payer: Self-pay | Admitting: Family Medicine

## 2017-04-14 ENCOUNTER — Other Ambulatory Visit: Payer: Self-pay | Admitting: Family Medicine

## 2017-05-04 DIAGNOSIS — D225 Melanocytic nevi of trunk: Secondary | ICD-10-CM | POA: Diagnosis not present

## 2017-05-04 DIAGNOSIS — D485 Neoplasm of uncertain behavior of skin: Secondary | ICD-10-CM | POA: Diagnosis not present

## 2017-05-04 DIAGNOSIS — L821 Other seborrheic keratosis: Secondary | ICD-10-CM | POA: Diagnosis not present

## 2017-05-04 DIAGNOSIS — L570 Actinic keratosis: Secondary | ICD-10-CM | POA: Diagnosis not present

## 2017-05-04 DIAGNOSIS — L304 Erythema intertrigo: Secondary | ICD-10-CM | POA: Diagnosis not present

## 2017-05-04 DIAGNOSIS — L738 Other specified follicular disorders: Secondary | ICD-10-CM | POA: Diagnosis not present

## 2017-05-17 ENCOUNTER — Ambulatory Visit (INDEPENDENT_AMBULATORY_CARE_PROVIDER_SITE_OTHER): Payer: Medicare Other

## 2017-05-17 VITALS — BP 102/68 | HR 73 | Temp 98.2°F | Ht 69.0 in | Wt 177.2 lb

## 2017-05-17 DIAGNOSIS — Z23 Encounter for immunization: Secondary | ICD-10-CM

## 2017-05-17 DIAGNOSIS — Z0000 Encounter for general adult medical examination without abnormal findings: Secondary | ICD-10-CM

## 2017-05-17 NOTE — Patient Instructions (Addendum)
Anthony Juarez , Thank you for taking time to come for your Medicare Wellness Visit. I appreciate your ongoing commitment to your health goals. Please review the following plan we discussed and let me know if I can assist you in the future.   Screening recommendations/referrals: Colonoscopy: You state that it is time for your colonoscopy (5 years) and you will contact your GI doctor to have this setup because they have contacted you about it.  Recommended yearly ophthalmology/optometry visit for glaucoma screening and checkup Recommended yearly dental visit for hygiene and checkup.  Vaccinations: Influenza vaccine: administered today  Pneumococcal vaccine: up to date Tdap vaccine: up to date, next due 05/14/2026 Shingles vaccine: up to date    Advanced directives: Please bring a copy of your POA (Power of [place]) and/or Living Will to your next appointment.   Conditions/risks identified: Try improve your prostate health.   Next appointment: 05/20/17 @ 8:20 am with Dr. Brigitte Pulse  Preventive Care 65 Years and Older, Male Preventive care refers to lifestyle choices and visits with your health care provider that can promote health and wellness. What does preventive care include?  A yearly physical exam. This is also called an annual well check.  Dental exams once or twice a year.  Routine eye exams. Ask your health care provider how often you should have your eyes checked.  Personal lifestyle choices, including:  Daily care of your teeth and gums.  Regular physical activity.  Eating a healthy diet.  Avoiding tobacco and drug use.  Limiting alcohol use.  Practicing safe sex.  Taking low doses of aspirin every day.  Taking vitamin and mineral supplements as recommended by your health care provider. What happens during an annual well check? The services and screenings done by your health care provider during your annual well check will depend on your age, overall health, lifestyle  risk factors, and family history of disease. Counseling  Your health care provider may ask you questions about your:  Alcohol use.  Tobacco use.  Drug use.  Emotional well-being.  Home and relationship well-being.  Sexual activity.  Eating habits.  History of falls.  Memory and ability to understand (cognition).  Work and work Statistician. Screening  You may have the following tests or measurements:  Height, weight, and BMI.  Blood pressure.  Lipid and cholesterol levels. These may be checked every 5 years, or more frequently if you are over 64 years old.  Skin check.  Lung cancer screening. You may have this screening every year starting at age 52 if you have a 30-pack-year history of smoking and currently smoke or have quit within the past 15 years.  Fecal occult blood test (FOBT) of the stool. You may have this test every year starting at age 37.  Flexible sigmoidoscopy or colonoscopy. You may have a sigmoidoscopy every 5 years or a colonoscopy every 10 years starting at age 43.  Prostate cancer screening. Recommendations will vary depending on your family history and other risks.  Hepatitis C blood test.  Hepatitis B blood test.  Sexually transmitted disease (STD) testing.  Diabetes screening. This is done by checking your blood sugar (glucose) after you have not eaten for a while (fasting). You may have this done every 1-3 years.  Abdominal aortic aneurysm (AAA) screening. You may need this if you are a current or former smoker.  Osteoporosis. You may be screened starting at age 33 if you are at high risk. Talk with your health care provider about your  test results, treatment options, and if necessary, the need for more tests. Vaccines  Your health care provider may recommend certain vaccines, such as:  Influenza vaccine. This is recommended every year.  Tetanus, diphtheria, and acellular pertussis (Tdap, Td) vaccine. You may need a Td booster every 10  years.  Zoster vaccine. You may need this after age 33.  Pneumococcal 13-valent conjugate (PCV13) vaccine. One dose is recommended after age 60.  Pneumococcal polysaccharide (PPSV23) vaccine. One dose is recommended after age 61. Talk to your health care provider about which screenings and vaccines you need and how often you need them. This information is not intended to replace advice given to you by your health care provider. Make sure you discuss any questions you have with your health care provider. Document Released: 07/26/2015 Document Revised: 03/18/2016 Document Reviewed: 04/30/2015 Elsevier Interactive Patient Education  2017 [place] Prevention in the Home Falls can cause injuries. They can happen to people of all ages. There are many things you can do to make your home safe and to help prevent falls. What can I do on the outside of my home?  Regularly fix the edges of walkways and driveways and fix any cracks.  Remove anything that might make you trip as you walk through a door, such as a raised step or threshold.  Trim any bushes or trees on the path to your home.  Use bright outdoor lighting.  Clear any walking paths of anything that might make someone trip, such as rocks or tools.  Regularly check to see if handrails are loose or broken. Make sure that both sides of any steps have handrails.  Any raised decks and porches should have guardrails on the edges.  Have any leaves, snow, or ice cleared regularly.  Use sand or salt on walking paths during winter.  Clean up any spills in your garage right away. This includes oil or grease spills. What can I do in the bathroom?  Use night lights.  Install grab bars by the toilet and in the tub and shower. Do not use towel bars as grab bars.  Use non-skid mats or decals in the tub or shower.  If you need to sit down in the shower, use a plastic, non-slip stool.  Keep the floor dry. Clean up any water that  spills on the floor as soon as it happens.  Remove soap buildup in the tub or shower regularly.  Attach bath mats securely with double-sided non-slip rug tape.  Do not have throw rugs and other things on the floor that can make you trip. What can I do in the bedroom?  Use night lights.  Make sure that you have a light by your bed that is easy to reach.  Do not use any sheets or blankets that are too big for your bed. They should not hang down onto the floor.  Have a firm chair that has side arms. You can use this for support while you get dressed.  Do not have throw rugs and other things on the floor that can make you trip. What can I do in the kitchen?  Clean up any spills right away.  Avoid walking on wet floors.  Keep items that you use a lot in easy-to-reach places.  If you need to reach something above you, use a strong step stool that has a grab bar.  Keep electrical cords out of the way.  Do not use floor polish or wax that  makes floors slippery. If you must use wax, use non-skid floor wax.  Do not have throw rugs and other things on the floor that can make you trip. What can I do with my stairs?  Do not leave any items on the stairs.  Make sure that there are handrails on both sides of the stairs and use them. Fix handrails that are broken or loose. Make sure that handrails are as long as the stairways.  Check any carpeting to make sure that it is firmly attached to the stairs. Fix any carpet that is loose or worn.  Avoid having throw rugs at the top or bottom of the stairs. If you do have throw rugs, attach them to the floor with carpet tape.  Make sure that you have a light switch at the top of the stairs and the bottom of the stairs. If you do not have them, ask someone to add them for you. What else can I do to help prevent falls?  Wear shoes that:  Do not have high heels.  Have rubber bottoms.  Are comfortable and fit you well.  Are closed at the  toe. Do not wear sandals.  If you use a stepladder:  Make sure that it is fully opened. Do not climb a closed stepladder.  Make sure that both sides of the stepladder are locked into place.  Ask someone to hold it for you, if possible.  Clearly mark and make sure that you can see:  Any grab bars or handrails.  First and last steps.  Where the edge of each step is.  Use tools that help you move around (mobility aids) if they are needed. These include:  Canes.  Walkers.  Scooters.  Crutches.  Turn on the lights when you go into a dark area. Replace any light bulbs as soon as they burn out.  Set up your furniture so you have a clear path. Avoid moving your furniture around.  If any of your floors are uneven, fix them.  If there are any pets around you, be aware of where they are.  Review your medicines with your doctor. Some medicines can make you feel dizzy. This can increase your chance of falling. Ask your doctor what other things that you can do to help prevent falls. This information is not intended to replace advice given to you by your health care provider. Make sure you discuss any questions you have with your health care provider. Document Released: 04/25/2009 Document Revised: 12/05/2015 Document Reviewed: 08/03/2014 Elsevier Interactive Patient Education  2017 [employer].

## 2017-05-17 NOTE — Progress Notes (Signed)
Subjective:   Haaris Metallo is a 71 y.o. male who presents for Medicare Annual/Subsequent preventive examination.  Review of Systems:  N/A Cardiac Risk Factors include: advanced age (>56men, >5 women);dyslipidemia;male gender     Objective:    Vitals: BP 102/68   Pulse 73   Temp 98.2 F (36.8 C) (Oral)   Ht 5\' 9"  (1.753 m)   Wt 177 lb 4 oz (80.4 kg)   SpO2 97%   BMI 26.18 kg/m   Body mass index is 26.18 kg/m.  Tobacco Social History   Tobacco Use  Smoking Status Former Smoker  . Packs/day: 1.00  . Years: 10.00  . Pack years: 10.00  . Last attempt to quit: 07/13/1977  . Years since quitting: 39.8  Smokeless Tobacco Never Used     Counseling given: Not Answered   Past Medical History:  Diagnosis Date  . Arthritis   . Balanitis   . Benign prostatic hypertrophy   . Cholecystitis with cholelithiasis   . Convulsive disorder ([place])   . History of nuclear stress test    Myoview 11/16: EF 60%, normal perfusion, Low Risk  . Hyperlipidemia   . Hyperlipidemia   . OSA (obstructive sleep apnea)   . Prostatitis   . Seizures ([place])    Past Surgical History:  Procedure Laterality Date  . ELBOW SURGERY  11/11/2000   repair  . FRACTURE SURGERY     LEFT ELBOW  . TONSILLECTOMY     Family History  Problem Relation Age of Onset  . Heart disease Father        cabg 21's  . Cancer Father        Prostate  . Heart disease Maternal Grandfather        Late 64's   Social History   Substance and Sexual Activity  Sexual Activity Not on file    Outpatient Encounter Medications as of 05/17/2017  Medication Sig  . Albuterol Sulfate (PROAIR RESPICLICK) 858 (90 Base) MCG/ACT AEPB Inhale 2 puffs into the lungs every 4 (four) hours as needed.  Marland Kitchen aspirin 81 MG tablet Take 81 mg by mouth daily.   Marland Kitchen doxazosin (CARDURA) 2 MG tablet Take 1 tablet (2 mg total) by mouth daily.  . finasteride (PROSCAR) 5 MG tablet TAKE 1 TABLET ONCE DAILY.  . fluticasone (FLONASE) 50 MCG/ACT nasal  spray USE 2 SPRAYS IN EACH NOSTRIL AT BEDTIME.  Marland Kitchen Levetiracetam 750 MG TB24 Take 1 tablet (750 mg total) by mouth daily. Please call [phone] to schedule yearly follow up.  . montelukast (SINGULAIR) 10 MG tablet TAKE ONE TABLET AT BEDTIME.  . Multiple Vitamins-Minerals (MULTIVITAMIN WITH MINERALS) tablet Take 1 tablet by mouth daily.  . polyethylene glycol powder (GLYCOLAX/MIRALAX) powder Take 17 g by mouth 2 (two) times daily as needed.   No facility-administered encounter medications on file as of 05/17/2017.     Activities of Daily Living In your present state of health, do you have any difficulty performing the following activities: 05/17/2017 11/12/2016  Hearing? Y Y  Comment Patient has slight hearing loss in his left ear. left ear  Vision? N N  Difficulty concentrating or making decisions? Y Y  Comment Patient has some short term memory loss.  short term memory  Walking or climbing stairs? N N  Dressing or bathing? N N  Doing errands, shopping? N N  Preparing Food and eating ? N -  Using the Toilet? N -  In the past six months, have you accidently leaked urine?  N -  Do you have problems with loss of bowel control? N -  Managing your Medications? N -  Managing your Finances? N -  Housekeeping or managing your Housekeeping? N -  Some recent data might be hidden    Patient Care Team: Shawnee Knapp, MD as PCP - General (Family Medicine) Marcial Pacas, MD as Consulting Physician (Neurology)   Assessment:     Exercise Activities and Dietary recommendations Current Exercise Habits: Structured exercise class, Type of exercise: strength training/weights(stationary cycles), Time (Minutes): 60, Frequency (Times/Week): 3, Weekly Exercise (Minutes/Week): 180, Intensity: Moderate, Exercise limited by: None identified  Goals    None     Fall Risk Fall Risk  05/17/2017 11/12/2016 05/14/2016 04/11/2016 03/28/2015  Falls in the past year? No No No No No  Number falls in past yr: - - - - -  Injury  with Fall? - - - - -  Comment - - - - -   Depression Screen PHQ 2/9 Scores 05/17/2017 11/12/2016 05/14/2016 04/11/2016  PHQ - 2 Score 0 0 0 0    Cognitive Function MMSE - Mini Mental State Exam 12/03/2016  Orientation to time 5  Orientation to Place 5  Registration 3  Attention/ Calculation 5  Recall 3  Language- name 2 objects 2  Language- repeat 1  Language- follow 3 step command 3  Language- read & follow direction 1  Write a sentence 1  Copy design 1  Total score 30     6CIT Screen 05/17/2017  What Year? 0 points  What month? 0 points  What time? 0 points  Count back from 20 0 points  Months in reverse 0 points  Repeat phrase 2 points  Total Score 2    Immunization History  Administered Date(s) Administered  . DTaP 01/15/2011  . Influenza Whole 05/22/2011, 04/12/2012  . Influenza,inj,Quad PF,6+ Mos 03/28/2015, 05/14/2016, 05/17/2017  . Pneumococcal Conjugate-13 03/28/2015  . Pneumococcal Polysaccharide-23 01/12/2014  . Td 05/14/2016  . Zoster 01/29/2014   Screening Tests Health Maintenance  Topic Date Due  . COLONOSCOPY  03/25/2021  . TETANUS/TDAP  05/14/2026  . INFLUENZA VACCINE  Completed  . Hepatitis C Screening  Completed  . PNA vac Low Risk Adult  Completed      Plan:   I have personally reviewed and noted the following in the patient's chart:   . Medical and social history . Use of alcohol, tobacco or illicit drugs  . Current medications and supplements . Functional ability and status . Nutritional status . Physical activity . Advanced directives . List of other physicians . Hospitalizations, surgeries, and ER visits in previous 12 months . Vitals . Screenings to include cognitive, depression, and falls . Referrals and appointments  In addition, I have reviewed and discussed with patient certain preventive protocols, quality metrics, and best practice recommendations. A written personalized care plan for preventive services as well as general  preventive health recommendations were provided to patient.     Andrez Grime, LPN  [id number]

## 2017-05-19 NOTE — Progress Notes (Addendum)
Subjective:    Patient ID: Anthony Juarez, male    DOB: 1946/05/04, 71 y.o.   MRN: [id number] Chief Complaint  Patient presents with  . Annual Exam    HPI  Anthony Juarez is a 71 yo male here for his annual complete physical.  Primary Preventative Screenings: Prostate Cancer: 0.9->0.8-> 0.4 05/2016 (dropped after started proscar?) STI screening: declines need, + untreated ED - married, monogamous/abstinent. Neg Hep C 2015 Colorectal Cancer: 03/2011 w/ Dr. Benson Norway - repeat in 5 yrs Tobacco use/AAA/Lung/EtOH/Illicit substances:  Cardiac:  Seen by cardiology Dr. Marlou Porch yrs prior. EKG - 03/2015; echo and POET done 2016 Weight/Blood sugar/Diet/Exercise: BMI Readings from Last 3 Encounters:  05/17/17 26.18 kg/m  12/03/16 26.22 kg/m  11/12/16 26.07 kg/m   No results found for: HGBA1C OTC/Vit/Supp/Herbal: asa, mvi, mag, probiotic gummies, psyllium husk fiber supp Dentist/Optho: Immunizations:  Immunization History  Administered Date(s) Administered  . DTaP 01/15/2011  . Influenza Whole 05/22/2011, 04/12/2012  . Influenza,inj,Quad PF,6+ Mos 03/28/2015, 05/14/2016, 05/17/2017  . Pneumococcal Conjugate-13 03/28/2015  . Pneumococcal Polysaccharide-23 01/12/2014  . Td 05/14/2016  . Zoster 01/29/2014   List the Names of Other Physician/Practitioners you currently use: 1.   GI - Dr. Benson Norway 2.  Derm - Dr. Allyson Sabal - Dr. Redmond Pulling, [facility] 3.  Urology - [facility] 4.   Cardiology - Dr. Marlou Porch 5.   Neurology - Dr. Krista Blue 6.   Sleep - Dr. Gwenette Greet - seen once in 2014    Chronic Medical Conditions: 1.  AVM in brain w/ seizure d/o - seen by neurology Dr. Krista Blue annually, last 11/2016 well controlled on keppra XR 750 qhs  2.  HLD: - stopped simvastatin 40 in 2016 after being on it for ~20 yrs as he was concerned it was causing some fatigue after which his ASCVD risk profile increased from 8.7 to 11.3% then 11.6% at visit 6 mos ago despite improvement in panel (LDL 138->120, HDL 92->100, non-HDL 156 ->  132) but optimal risk is 10.3%.  Fortunately, HDL naturally quite high - no omega-3 supps. Apolipoprotein B nml and HS-CRP was quite low at 0.5 which was very reassuring. No fating this a.m.  3.  GERD: EGD 2012 by Dr. Benson Norway. Developed breakthrough sxs and pill dysphagia on nexium so changed to prilosec. Currently doing well other than belching. Has had long-standing worsening bloating, gas (belching and flatulence), stool frequency w/ sensation of incomplete evacuation, stopped psyllium husk fiber supp, mag, and probiotic gummies. Could not tolerate miralax -> diarrhea.  5.   Central sleep apnea but couldn't tolerate BiPAP. Repeat sleep study in 2014 showed no need for cpap - Dr. Gwenette Greet  4.  ED - wife won't let him take diphospho-esterase inh and lack of intimacy is casing problems in their marriage. The ED is very longstanding - over a decade and pt reports his testosterone level has been checked sev times and always nml but when last checked 1 yr prior did show a low Free test and low bioavail test so encouraged pt to discuss this w/ urology (did not go to appt).  Not fating this a.m.  6.  BPH with nocturia and urinary freq - failed flomax, seen by Dr. Venia Minks at [facility] sev yr prior. Did have sig improvemet in sxs with nocturia decreasing to 2x/night and he was treated for chronic bacterial prostatis and started on finasteride 5 and doxazosin 2 03/2016. Stopped proscar last yr w/o noting any worsening of sxs but restarted 6 mos prior for prevention of worsening  7.   Pectus excavatum - mild and of no clinical consequence  8.   H/o childhood asthma - prn albuterol. Singulair. flonaseOnly uses alb when ill.  9.  Developed jock itch 8 weeks ago. Now has been using spray powder tinactin with some improvement for 4 weeks - using twice a  day more days.  No other rashes. No inflammation or redness, is scrotal with just itching, no rash visible. Nothing between thighs or rectal.   Past  Medical History:  Diagnosis Date  . Arthritis   . Balanitis   . Benign prostatic hypertrophy   . Cholecystitis with cholelithiasis   . Convulsive disorder ([place])   . History of nuclear stress test    Myoview 11/16: EF 60%, normal perfusion, Low Risk  . Hyperlipidemia   . Hyperlipidemia   . OSA (obstructive sleep apnea)   . Prostatitis   . Seizures ([place])    Past Surgical History:  Procedure Laterality Date  . ELBOW SURGERY  11/11/2000   repair  . FRACTURE SURGERY     LEFT ELBOW  . TONSILLECTOMY     Current Outpatient Medications on File Prior to Visit  Medication Sig Dispense Refill  . Albuterol Sulfate (PROAIR RESPICLICK) 254 (90 Base) MCG/ACT AEPB Inhale 2 puffs into the lungs every 4 (four) hours as needed. 1 each 2  . aspirin 81 MG tablet Take 81 mg by mouth daily.     Marland Kitchen doxazosin (CARDURA) 2 MG tablet Take 1 tablet (2 mg total) by mouth daily. 90 tablet 3  . finasteride (PROSCAR) 5 MG tablet TAKE 1 TABLET ONCE DAILY. 90 tablet 3  . fluticasone (FLONASE) 50 MCG/ACT nasal spray USE 2 SPRAYS IN EACH NOSTRIL AT BEDTIME. 16 g 0  . Levetiracetam 750 MG TB24 Take 1 tablet (750 mg total) by mouth daily. Please call [phone] to schedule yearly follow up. 90 tablet 4  . montelukast (SINGULAIR) 10 MG tablet TAKE ONE TABLET AT BEDTIME. 90 tablet 0  . Multiple Vitamins-Minerals (MULTIVITAMIN WITH MINERALS) tablet Take 1 tablet by mouth daily.    . polyethylene glycol powder (GLYCOLAX/MIRALAX) powder Take 17 g by mouth 2 (two) times daily as needed. 259 g 11   No current facility-administered medications on file prior to visit.    Allergies  Allergen Reactions  . Penicillins Rash   Family History  Problem Relation Age of Onset  . Heart disease Father        cabg 30's  . Cancer Father        Prostate  . Heart disease Maternal Grandfather        Late 14's   Social History   Socioeconomic History  . Marital status: Married    Spouse name: Not on file  . Number of children:  Not on file  . Years of education: Not on file  . Highest education level: Not on file  Social Needs  . Financial resource strain: Not on file  . Food insecurity - worry: Not on file  . Food insecurity - inability: Not on file  . Transportation needs - medical: Not on file  . Transportation needs - non-medical: Not on file  Occupational History  . Occupation: retired    Fish farm manager: RETIRED  Tobacco Use  . Smoking status: Former Smoker    Packs/day: 1.00    Years: 10.00    Pack years: 10.00    Last attempt to quit: 07/13/1977    Years since quitting: 39.8  . Smokeless tobacco: Never Used  Substance and Sexual Activity  . Alcohol use: Yes    Alcohol/week: 1.2 oz    Types: 2 Cans of beer per week  . Drug use: No  . Sexual activity: Not on file  Other Topics Concern  . Not on file  Social History Narrative  . Not on file   Depression screen [facility] 2/9 05/17/2017 11/12/2016 05/14/2016 04/11/2016 09/02/2015  Decreased Interest 0 0 0 0 0  Down, Depressed, Hopeless 0 0 0 0 0  PHQ - 2 Score 0 0 0 0 0    Review of Systems see hpi    Objective:   Physical Exam  Constitutional: He is oriented to person, place, and time. He appears well-developed and well-nourished. No distress.  HENT:  Head: Normocephalic and atraumatic.  Right Ear: Tympanic membrane, external ear and ear canal normal.  Left Ear: Tympanic membrane, external ear and ear canal normal.  Nose: Nose normal.  Mouth/Throat: Uvula is midline, oropharynx is clear and moist and mucous membranes are normal. No oropharyngeal exudate.  Eyes: Conjunctivae are normal. Pupils are equal, round, and reactive to light. Right eye exhibits no discharge. Left eye exhibits no discharge. No scleral icterus.  Neck: Normal range of motion. Neck supple. No thyromegaly present.  Cardiovascular: Normal rate, regular rhythm, normal heart sounds and intact distal pulses.  Pulmonary/Chest: Effort normal and breath sounds normal. No respiratory distress.    Abdominal: Soft. Bowel sounds are normal. He exhibits no distension and no mass. There is no tenderness. There is no rebound and no guarding.  Genitourinary: Prostate normal. Prostate is not enlarged and not tender.  Musculoskeletal: He exhibits no edema.  Lymphadenopathy:    He has no cervical adenopathy.  Neurological: He is alert and oriented to person, place, and time. He has normal reflexes. No cranial nerve deficit. He exhibits normal muscle tone.  Skin: Skin is warm and dry. No rash noted. He is not diaphoretic. No erythema.  Psychiatric: He has a normal mood and affect. His behavior is normal.   /BP 100/80   Pulse 62   Temp 97.6 F (36.4 C)   Resp 16   Ht 5\' 9"  (1.753 m)   Wt 177 lb 9.6 oz (80.6 kg)   SpO2 98%   BMI 26.23 kg/m      Assessment & Plan:  Psa, testosterone, lipid, cmp, cbc, ua  Refer to urology for testosteorne - he does plan to see urology this yr - will call for referral when needed.  Due for repeat colonoscopy - pt plans to f/u w/ Dr. Benson Norway for this this yr - call if referral is needed.  Zostavax 2015 so shingrix in another 1-2 yrs.  1. Annual physical exam   2. Screening for cardiovascular, respiratory, and genitourinary diseases   3. Screening for colorectal cancer   4. Screening for prostate cancer   5. Mixed hyperlipidemia   6. Benign prostatic hyperplasia with incomplete bladder emptying   7. Seizure disorder ([place])   8. Gastroesophageal reflux disease, esophagitis presence not specified   9. AVM (arteriovenous malformation) brain   10. Low serum testosterone level in male   57. Medication monitoring encounter    No changes in med regimen.  Orders Placed This Encounter  Procedures  . PSA  . CBC with Differential/Platelet  . Comprehensive metabolic panel    Order Specific Question:   Has the patient fasted?    Answer:   Yes  . TestT+TestF+SHBG  . POCT urinalysis dipstick    Meds  ordered this encounter  Medications  .  clotrimazole-betamethasone (LOTRISONE) cream    Sig: Apply 1 application 2 (two) times daily topically.    Dispense:  45 g    Refill:  0  . doxazosin (CARDURA) 2 MG tablet    Sig: Take 1 tablet (2 mg total) daily by mouth.    Dispense:  90 tablet    Refill:  3  . finasteride (PROSCAR) 5 MG tablet    Sig: Take 1 tablet (5 mg total) daily by mouth.    Dispense:  90 tablet    Refill:  3  . fluticasone (FLONASE) 50 MCG/ACT nasal spray    Sig: USE 2 SPRAYS IN EACH NOSTRIL AT BEDTIME.    Dispense:  16 g    Refill:  5  . montelukast (SINGULAIR) 10 MG tablet    Sig: Take 1 tablet (10 mg total) at bedtime by mouth.    Dispense:  90 tablet    Refill:  3  . Albuterol Sulfate (PROAIR RESPICLICK) 141 (90 Base) MCG/ACT AEPB    Sig: Inhale 2 puffs every 4 (four) hours as needed into the lungs.    Dispense:  1 each    Refill:  2    Please hold on file until pt requests      Delman Cheadle, M.D.  [facility] [address] [place], [place] [address] [phone] phone [phone] fax  05/22/17 11:31 PM

## 2017-05-20 ENCOUNTER — Ambulatory Visit (INDEPENDENT_AMBULATORY_CARE_PROVIDER_SITE_OTHER): Payer: Medicare Other | Admitting: Family Medicine

## 2017-05-20 ENCOUNTER — Encounter: Payer: Self-pay | Admitting: Family Medicine

## 2017-05-20 ENCOUNTER — Other Ambulatory Visit: Payer: Self-pay

## 2017-05-20 VITALS — BP 100/80 | HR 62 | Temp 97.6°F | Resp 16 | Ht 69.0 in | Wt 177.6 lb

## 2017-05-20 DIAGNOSIS — E782 Mixed hyperlipidemia: Secondary | ICD-10-CM

## 2017-05-20 DIAGNOSIS — R3914 Feeling of incomplete bladder emptying: Secondary | ICD-10-CM

## 2017-05-20 DIAGNOSIS — Z5181 Encounter for therapeutic drug level monitoring: Secondary | ICD-10-CM | POA: Diagnosis not present

## 2017-05-20 DIAGNOSIS — Z1211 Encounter for screening for malignant neoplasm of colon: Secondary | ICD-10-CM

## 2017-05-20 DIAGNOSIS — Q282 Arteriovenous malformation of cerebral vessels: Secondary | ICD-10-CM

## 2017-05-20 DIAGNOSIS — K219 Gastro-esophageal reflux disease without esophagitis: Secondary | ICD-10-CM

## 2017-05-20 DIAGNOSIS — G40909 Epilepsy, unspecified, not intractable, without status epilepticus: Secondary | ICD-10-CM | POA: Diagnosis not present

## 2017-05-20 DIAGNOSIS — Z136 Encounter for screening for cardiovascular disorders: Secondary | ICD-10-CM

## 2017-05-20 DIAGNOSIS — N401 Enlarged prostate with lower urinary tract symptoms: Secondary | ICD-10-CM | POA: Diagnosis not present

## 2017-05-20 DIAGNOSIS — R7989 Other specified abnormal findings of blood chemistry: Secondary | ICD-10-CM | POA: Diagnosis not present

## 2017-05-20 DIAGNOSIS — Z1389 Encounter for screening for other disorder: Secondary | ICD-10-CM

## 2017-05-20 DIAGNOSIS — Z0000 Encounter for general adult medical examination without abnormal findings: Secondary | ICD-10-CM

## 2017-05-20 DIAGNOSIS — Z1383 Encounter for screening for respiratory disorder NEC: Secondary | ICD-10-CM | POA: Diagnosis not present

## 2017-05-20 DIAGNOSIS — Z1212 Encounter for screening for malignant neoplasm of rectum: Secondary | ICD-10-CM

## 2017-05-20 DIAGNOSIS — Z125 Encounter for screening for malignant neoplasm of prostate: Secondary | ICD-10-CM | POA: Diagnosis not present

## 2017-05-20 LAB — POCT URINALYSIS DIP (MANUAL ENTRY)
BILIRUBIN UA: NEGATIVE mg/dL
Bilirubin, UA: NEGATIVE
Glucose, UA: NEGATIVE mg/dL
LEUKOCYTES UA: NEGATIVE
Nitrite, UA: NEGATIVE
PH UA: 7 (ref 5.0–8.0)
PROTEIN UA: NEGATIVE mg/dL
SPEC GRAV UA: 1.02 (ref 1.010–1.025)
UROBILINOGEN UA: 0.2 U/dL

## 2017-05-20 MED ORDER — CLOTRIMAZOLE-BETAMETHASONE 1-0.05 % EX CREA: 1.0000 "application " | TOPICAL_CREAM | Freq: Two times a day (BID) | CUTANEOUS | 0 refills | Status: DC

## 2017-05-20 NOTE — Patient Instructions (Addendum)
IF you received an x-ray today, you will receive an invoice from [facility]. Please contact [facility] at [phone] with questions or concerns regarding your invoice.   IF you received labwork today, you will receive an invoice from [facility]. Please contact [facility] at [phone] with questions or concerns regarding your invoice.   Our billing staff will not be able to assist you with questions regarding bills from these companies.  You will be contacted with the lab results as soon as they are available. The fastest way to get your results is to activate your My Chart account. Instructions are located on the last page of this paperwork. If you have not heard from [place] regarding the results in 2 weeks, please contact this office.    Health Maintenance, Male A healthy lifestyle and preventive care is important for your health and wellness. Ask your health care provider about what schedule of regular examinations is right for you. What should I know about weight and diet? Eat a Healthy Diet  Eat plenty of vegetables, fruits, whole grains, low-fat dairy products, and lean protein.  Do not eat a lot of foods high in solid fats, added sugars, or salt.  Maintain a Healthy Weight Regular exercise can help you achieve or maintain a healthy weight. You should:  Do at least 150 minutes of exercise each week. The exercise should increase your heart rate and make you sweat (moderate-intensity exercise).  Do strength-training exercises at least twice a week.  Watch Your Levels of Cholesterol and Blood Lipids  Have your blood tested for lipids and cholesterol every 5 years starting at 69 years of age. If you are at high risk for heart disease, you should start having your blood tested when you are 69 years old. You may need to have your cholesterol levels checked more often if: ? Your lipid or cholesterol levels are high. ? You are older than 69 years of age. ? You are  at high risk for heart disease.  What should I know about cancer screening? Many types of cancers can be detected early and may often be prevented. Lung Cancer  You should be screened every year for lung cancer if: ? You are a current smoker who has smoked for at least 30 years. ? You are a former smoker who has quit within the past 15 years.  Talk to your health care provider about your screening options, when you should start screening, and how often you should be screened.  Colorectal Cancer  Routine colorectal cancer screening usually begins at 69 years of age and should be repeated every 5-10 years until you are 69 years old. You may need to be screened more often if early forms of precancerous polyps or small growths are found. Your health care provider may recommend screening at an earlier age if you have risk factors for colon cancer.  Your health care provider may recommend using home test kits to check for hidden blood in the stool.  A small camera at the end of a tube can be used to examine your colon (sigmoidoscopy or colonoscopy). This checks for the earliest forms of colorectal cancer.  Prostate and Testicular Cancer  Depending on your age and overall health, your health care provider may do certain tests to screen for prostate and testicular cancer.  Talk to your health care provider about any symptoms or concerns you have about testicular or prostate cancer.  Skin Cancer  Check your skin from head  to toe regularly.  Tell your health care provider about any new moles or changes in moles, especially if: ? There is a change in a mole's size, shape, or color. ? You have a mole that is larger than a pencil eraser.  Always use sunscreen. Apply sunscreen liberally and repeat throughout the day.  Protect yourself by wearing long sleeves, pants, a wide-brimmed hat, and sunglasses when outside.  What should I know about heart disease, diabetes, and high blood  pressure?  If you are 98-38 years of age, have your blood pressure checked every 3-5 years. If you are 65 years of age or older, have your blood pressure checked every year. You should have your blood pressure measured twice-once when you are at a hospital or clinic, and once when you are not at a hospital or clinic. Record the average of the two measurements. To check your blood pressure when you are not at a hospital or clinic, you can use: ? An automated blood pressure machine at a pharmacy. ? A home blood pressure monitor.  Talk to your health care provider about your target blood pressure.  If you are between 21-69 years old, ask your health care provider if you should take aspirin to prevent heart disease.  Have regular diabetes screenings by checking your fasting blood sugar level. ? If you are at a normal weight and have a low risk for diabetes, have this test once every three years after the age of 11. ? If you are overweight and have a high risk for diabetes, consider being tested at a younger age or more often.  A one-time screening for abdominal aortic aneurysm (AAA) by ultrasound is recommended for men aged 26-75 years who are current or former smokers. What should I know about preventing infection? Hepatitis B If you have a higher risk for hepatitis B, you should be screened for this virus. Talk with your health care provider to find out if you are at risk for hepatitis B infection. Hepatitis C Blood testing is recommended for:  Everyone born from [birth year] through 1963.  Anyone with known risk factors for hepatitis C.  Sexually Transmitted Diseases (STDs)  You should be screened each year for STDs including gonorrhea and chlamydia if: ? You are sexually active and are younger than 69 years of age. ? You are older than 69 years of age and your health care provider tells you that you are at risk for this type of infection. ? Your sexual activity has changed since you were last  screened and you are at an increased risk for chlamydia or gonorrhea. Ask your health care provider if you are at risk.  Talk with your health care provider about whether you are at high risk of being infected with HIV. Your health care provider may recommend a prescription medicine to help prevent HIV infection.  What else can I do?  Schedule regular health, dental, and eye exams.  Stay current with your vaccines (immunizations).  Do not use any tobacco products, such as cigarettes, chewing tobacco, and e-cigarettes. If you need help quitting, ask your health care provider.  Limit alcohol intake to no more than 2 drinks per day. One drink equals 12 ounces of beer, 5 ounces of wine, or 1 ounces of hard liquor.  Do not use street drugs.  Do not share needles.  Ask your health care provider for help if you need support or information about quitting drugs.  Tell your health care provider if  you often feel depressed.  Tell your health care provider if you have ever been abused or do not feel safe at home. This information is not intended to replace advice given to you by your health care provider. Make sure you discuss any questions you have with your health care provider. Document Released: 12/26/2007 Document Revised: 02/26/2016 Document Reviewed: 04/02/2015 Elsevier Interactive Patient Education  2018 [place] Itch Jock itch (tinea cruris) is a fungal infection of the skin in the groin area. It is sometimes called ringworm, even though it is not caused by worms. It is caused by a fungus, which is a type of germ that thrives in dark, damp places. Jock itch causes a rash and itching in the groin and upper thigh area. It usually goes away in 2-3 weeks with treatment. What are the causes? The fungus that causes jock itch may be spread by:  Touching a fungus infection elsewhere on your body-such as athlete's foot-and then touching your groin area.  Sharing towels or  clothing with an infected person.  What increases the risk? Jock itch is most common in men and adolescent boys. This condition is more likely to develop from:  Being in hot, humid climates.  Wearing tight-fitting clothing or wet bathing suits for long periods of time.  Participating in sports.  Being overweight.  Having diabetes.  What are the signs or symptoms? Symptoms of jock itch may include:  A red, pink, or brown rash in the groin area. The rash may spread to the thighs, anus, and buttocks.  Dry and scaly skin on or around the rash.  Itchiness.  How is this diagnosed? Most often, a health care provider can make the diagnosis by looking at your rash. Sometimes, a scraping of the infected skin will be taken. This sample may be tested by looking at it under a microscope or by trying to grow the fungus from the sample (culture). How is this treated? Treatment for this condition may include:  Antifungal medicine to kill the fungus. This may be in various forms: ? Skin cream or ointment. ? Medicine taken by mouth.  Skin cream or ointment to reduce the itching.  Compresses or medicated powders to dry the infected skin.  Follow these instructions at home:  Take medicines only as directed by your health care provider. Apply skin creams or ointments exactly as directed.  Wear loose-fitting clothing. ? Men should wear cotton boxer shorts. ? Women should wear cotton underwear.  Change your underwear every day to keep your groin dry.  Avoid hot baths.  Dry your groin area well after bathing. ? Use a separate towel to dry your groin area. This will help to prevent a spreading of the infection to other areas of your body.  Do not scratch the affected area.  Do not share towels with other people. Contact a health care provider if:  Your rash does not improve or it gets worse after 2 weeks of treatment.  Your rash is spreading.  Your rash returns after treatment is  finished.  You have a fever.  You have redness, swelling, or pain in the area around your rash.  You have fluid, blood, or pus coming from your rash.  Your have your rash for more than 4 weeks. This information is not intended to replace advice given to you by your health care provider. Make sure you discuss any questions you have with your health care provider. Document Released: 06/19/2002 Document Revised:  12/05/2015 Document Reviewed: 04/10/2014 Elsevier Interactive Patient Education  [employer].

## 2017-05-22 LAB — TESTT+TESTF+SHBG
SEX HORMONE BINDING: 104.8 nmol/L — AB (ref 19.3–76.4)
TESTOSTERONE, TOTAL: 717.7 ng/dL (ref 264.0–916.0)
Testosterone, Free: 6.5 pg/mL — ABNORMAL LOW (ref 6.6–18.1)

## 2017-05-22 LAB — CBC WITH DIFFERENTIAL/PLATELET
BASOS ABS: 0.1 10*3/uL (ref 0.0–0.2)
Basos: 1 %
EOS (ABSOLUTE): 0.4 10*3/uL (ref 0.0–0.4)
Eos: 5 %
Hematocrit: 44.8 % (ref 37.5–51.0)
Hemoglobin: 15.4 g/dL (ref 13.0–17.7)
Immature Grans (Abs): 0 10*3/uL (ref 0.0–0.1)
Immature Granulocytes: 0 %
LYMPHS ABS: 3 10*3/uL (ref 0.7–3.1)
Lymphs: 40 %
MCH: 30.7 pg (ref 26.6–33.0)
MCHC: 34.4 g/dL (ref 31.5–35.7)
MCV: 89 fL (ref 79–97)
MONOS ABS: 0.4 10*3/uL (ref 0.1–0.9)
Monocytes: 6 %
Neutrophils Absolute: 3.6 10*3/uL (ref 1.4–7.0)
Neutrophils: 48 %
Platelets: 282 10*3/uL (ref 150–379)
RBC: 5.01 x10E6/uL (ref 4.14–5.80)
RDW: 13.9 % (ref 12.3–15.4)
WBC: 7.4 10*3/uL (ref 3.4–10.8)

## 2017-05-22 LAB — COMPREHENSIVE METABOLIC PANEL
ALBUMIN: 4.2 g/dL (ref 3.6–4.8)
ALT: 12 IU/L (ref 0–44)
AST: 25 IU/L (ref 0–40)
Albumin/Globulin Ratio: 1.8 (ref 1.2–2.2)
Alkaline Phosphatase: 57 IU/L (ref 39–117)
BILIRUBIN TOTAL: 0.7 mg/dL (ref 0.0–1.2)
BUN/Creatinine Ratio: 14 (ref 10–24)
BUN: 15 mg/dL (ref 8–27)
CALCIUM: 9.5 mg/dL (ref 8.6–10.2)
CHLORIDE: 102 mmol/L (ref 96–106)
CO2: 25 mmol/L (ref 20–29)
CREATININE: 1.11 mg/dL (ref 0.76–1.27)
GFR calc non Af Amer: 67 mL/min/{1.73_m2} (ref >59)
GFR, EST AFRICAN AMERICAN: 78 mL/min/{1.73_m2} (ref >59)
GLUCOSE: 91 mg/dL (ref 65–99)
Globulin, Total: 2.3 g/dL (ref 1.5–4.5)
Potassium: 4.6 mmol/L (ref 3.5–5.2)
Sodium: 141 mmol/L (ref 134–144)
TOTAL PROTEIN: 6.5 g/dL (ref 6.0–8.5)

## 2017-05-22 LAB — PSA: PROSTATE SPECIFIC AG, SERUM: 0.4 ng/mL (ref 0.0–4.0)

## 2017-05-25 MED ORDER — ALBUTEROL SULFATE 108 (90 BASE) MCG/ACT IN AEPB: 2.0000 | INHALATION_SPRAY | RESPIRATORY_TRACT | 2 refills | Status: DC | PRN

## 2017-05-25 MED ORDER — MONTELUKAST SODIUM 10 MG PO TABS: 10.0000 mg | ORAL_TABLET | Freq: Every day | ORAL | 3 refills | Status: DC

## 2017-05-25 MED ORDER — FLUTICASONE PROPIONATE 50 MCG/ACT NA SUSP: NASAL | 5 refills | Status: DC

## 2017-05-25 MED ORDER — FINASTERIDE 5 MG PO TABS: 5.0000 mg | ORAL_TABLET | Freq: Every day | ORAL | 3 refills | Status: DC

## 2017-05-25 MED ORDER — DOXAZOSIN MESYLATE 2 MG PO TABS: 2.0000 mg | ORAL_TABLET | Freq: Every day | ORAL | 3 refills | Status: DC

## 2017-05-25 NOTE — Addendum Note (Signed)
Addended by: Shawnee Knapp on: 05/25/2017 03:19 PM   Modules accepted: Orders

## 2017-06-01 ENCOUNTER — Telehealth: Payer: Self-pay

## 2017-06-01 MED ORDER — ALBUTEROL SULFATE HFA 108 (90 BASE) MCG/ACT IN AERS: 2.0000 | INHALATION_SPRAY | RESPIRATORY_TRACT | 2 refills | Status: AC | PRN

## 2017-06-01 NOTE — Telephone Encounter (Signed)
The pharmacy faxed a request for a ventolin HFA inhaler, due to the patient's insurance not covering the [place]. Please sent new Rx to pharmacy Thanks

## 2017-06-01 NOTE — Telephone Encounter (Signed)
Done

## 2017-07-26 DIAGNOSIS — Z86010 Personal history of colonic polyps: Secondary | ICD-10-CM | POA: Diagnosis not present

## 2017-07-26 DIAGNOSIS — F458 Other somatoform disorders: Secondary | ICD-10-CM | POA: Diagnosis not present

## 2017-07-26 DIAGNOSIS — R142 Eructation: Secondary | ICD-10-CM | POA: Diagnosis not present

## 2017-07-26 DIAGNOSIS — R140 Abdominal distension (gaseous): Secondary | ICD-10-CM | POA: Diagnosis not present

## 2017-09-22 ENCOUNTER — Encounter: Payer: Self-pay | Admitting: Family Medicine

## 2017-09-22 ENCOUNTER — Other Ambulatory Visit: Payer: Self-pay

## 2017-09-22 ENCOUNTER — Ambulatory Visit (INDEPENDENT_AMBULATORY_CARE_PROVIDER_SITE_OTHER): Payer: Medicare Other | Admitting: Family Medicine

## 2017-09-22 VITALS — BP 102/62 | HR 80 | Temp 99.2°F | Ht 69.0 in | Wt 176.0 lb

## 2017-09-22 DIAGNOSIS — L03818 Cellulitis of other sites: Secondary | ICD-10-CM | POA: Diagnosis not present

## 2017-09-22 MED ORDER — DOXYCYCLINE HYCLATE 100 MG PO TABS: 100.0000 mg | ORAL_TABLET | Freq: Two times a day (BID) | ORAL | 0 refills | Status: DC

## 2017-09-22 NOTE — Progress Notes (Signed)
Subjective:    Patient ID: Anthony Juarez, male    DOB: 1945/09/12, 72 y.o.   MRN: [id number]   Chief Complaint  Patient presents with  . Abrasion    has had a wart on skin for yrs located on the right shoulder, scratched it off. Now experiencing itching, burning and redness that is starting to spread. Used tea tree oil and benedryl to ease symptoms    HPI Patient presents with an abrasion on his right shoulder. Patient reports that he scratched off the wart 8 days ago. Patient applied Neosporin to abrasion directly after it began to bleed. Patient continues to apply Neosporin every other day. Two days ago, patient applied tea tree oil one time to the wound. Patient noticed burning when he applied the tea tree oil. Patient noted that the wound began itching 4-5 days ago. Patient took benadryl this morning. Patient reports that the benadryl alleviated the itching moderately. Patient notes that the redness is spreading.   Patient Active Problem List   Diagnosis Date Noted  . Abnormal finding on MRI of brain 12/04/2016  . Benign prostatic hyperplasia with incomplete bladder emptying 11/11/2016  . Hyperlipidemia 05/13/2016  . Esophageal reflux 03/28/2015  . Shortness of breath 03/28/2015  . Skin lesion of face 03/28/2015  . AVM (arteriovenous malformation) brain 06/15/2013  . Seizure disorder ([place]) 01/19/2013  . Central sleep apnea 06/10/2012    Allergies  Allergen Reactions  . Penicillins Rash   Prior to Admission medications   Medication Sig Start Date End Date Taking? Authorizing Provider  albuterol (PROVENTIL HFA;VENTOLIN HFA) 108 (90 Base) MCG/ACT inhaler Inhale 2 puffs into the lungs every 4 (four) hours as needed for wheezing or shortness of breath (cough, shortness of breath or wheezing.). 06/01/17  Yes Shawnee Knapp, MD  aspirin 81 MG tablet Take 81 mg by mouth daily.    Yes [provider]  clotrimazole-betamethasone (LOTRISONE) cream Apply 1 application 2 (two)  times daily topically. 05/20/17  Yes Shawnee Knapp, MD  doxazosin (CARDURA) 2 MG tablet Take 1 tablet (2 mg total) daily by mouth. 05/25/17  Yes Shawnee Knapp, MD  finasteride (PROSCAR) 5 MG tablet Take 1 tablet (5 mg total) daily by mouth. 05/25/17  Yes Shawnee Knapp, MD  fluticasone [facility]) 50 MCG/ACT nasal spray USE 2 SPRAYS IN EACH NOSTRIL AT BEDTIME. 05/25/17  Yes Shawnee Knapp, MD  Levetiracetam 750 MG TB24 Take 1 tablet (750 mg total) by mouth daily. Please call [phone] to schedule yearly follow up. 12/03/16  Yes Marcial Pacas, MD  montelukast (SINGULAIR) 10 MG tablet Take 1 tablet (10 mg total) at bedtime by mouth. 05/25/17  Yes Shawnee Knapp, MD  Multiple Vitamins-Minerals (MULTIVITAMIN WITH MINERALS) tablet Take 1 tablet by mouth daily.   Yes [provider]  polyethylene glycol powder (GLYCOLAX/MIRALAX) powder Take 17 g by mouth 2 (two) times daily as needed. 08/21/15  Yes Shawnee Knapp, MD   Past Medical History:  Diagnosis Date  . Arthritis   . Balanitis   . Benign prostatic hypertrophy   . Cholecystitis with cholelithiasis   . Convulsive disorder ([place])   . History of nuclear stress test    Myoview 11/16: EF 60%, normal perfusion, Low Risk  . Hyperlipidemia   . Hyperlipidemia   . OSA (obstructive sleep apnea)   . Prostatitis   . Seizures ([address])    Social History   Socioeconomic History  . Marital status: Married    Spouse name:  Not on file  . Number of children: Not on file  . Years of education: Not on file  . Highest education level: Not on file  Social Needs  . Financial resource strain: Not on file  . Food insecurity - worry: Not on file  . Food insecurity - inability: Not on file  . Transportation needs - medical: Not on file  . Transportation needs - non-medical: Not on file  Occupational History  . Occupation: retired    Fish farm manager: RETIRED  Tobacco Use  . Smoking status: Former Smoker    Packs/day: 1.00    Years: 10.00    Pack years: 10.00    Last attempt to  quit: 07/13/1977    Years since quitting: 40.2  . Smokeless tobacco: Never Used  Substance and Sexual Activity  . Alcohol use: Yes    Alcohol/week: 1.2 oz    Types: 2 Cans of beer per week    Comment: 2 beers a day  . Drug use: No  . Sexual activity: Not Currently  Other Topics Concern  . Not on file  Social History Narrative   Patient worked in [employer].   Family History  Problem Relation Age of Onset  . Heart disease Father        cabg 66's  . Cancer Father        Prostate  . Heart disease Maternal Grandfather        Late 64's  . Dementia Mother    Past Surgical History:  Procedure Laterality Date  . ELBOW SURGERY  11/11/2000   repair  . FRACTURE SURGERY     LEFT ELBOW  . TONSILLECTOMY      Review of Systems  Constitutional: Negative.  Negative for activity change, appetite change, diaphoresis and fever.  HENT: Negative.  Negative for congestion, drooling and tinnitus.   Eyes: Negative.  Negative for discharge and itching.  Respiratory: Negative.  Negative for cough, chest tightness and shortness of breath.   Cardiovascular: Negative.  Negative for chest pain and palpitations.  Gastrointestinal: Negative.  Negative for abdominal pain, constipation, diarrhea, nausea and vomiting.  Endocrine: Negative.  Negative for polydipsia and polyuria.  Genitourinary: Negative.  Negative for difficulty urinating and frequency.  Musculoskeletal: Negative.  Negative for arthralgias, back pain, gait problem, joint swelling and myalgias.  Skin: Positive for wound. Negative for color change, pallor and rash.  Allergic/Immunologic: Negative.  Negative for environmental allergies and food allergies.  Neurological: Negative.  Negative for dizziness, light-headedness and headaches.  Psychiatric/Behavioral: Negative.  Negative for sleep disturbance.       Objective:   Physical Exam  Constitutional: He is oriented to person, place, and time. He appears well-developed and  well-nourished.  BP 102/62 (BP Location: Left Arm, Patient Position: Sitting, Cuff Size: Normal)   Pulse 80   Temp 99.2 F (37.3 C) (Oral)   Ht 5\' 9"  (1.753 m)   Wt 176 lb (79.8 kg)   SpO2 97%   BMI 25.99 kg/m   HENT:  Head: Normocephalic and atraumatic.  Right Ear: External ear normal.  Left Ear: External ear normal.  Nose: Nose normal.  Mouth/Throat: Oropharynx is clear and moist.  Eyes: Conjunctivae and EOM are normal. Pupils are equal, round, and reactive to light.  Neck: Normal range of motion. Neck supple.  Cardiovascular: Normal rate, regular rhythm, normal heart sounds and intact distal pulses. Exam reveals no gallop and no friction rub.  No murmur heard. Pulmonary/Chest: Effort normal and breath sounds normal.  Abdominal: Soft. Bowel sounds are normal.  Musculoskeletal: Normal range of motion. He exhibits no edema, tenderness or deformity.  Neurological: He is alert and oriented to person, place, and time. He has normal reflexes.  Skin: Skin is warm and dry. Rash (On anterior aspect of right shoulder. Warm and Raised.) noted. There is erythema. No pallor.  Psychiatric: He has a normal mood and affect. His behavior is normal. Judgment and thought content normal.      Assessment & Plan:   1. Cellulitis of other specified site The abrasion caused by scratching on the anterior aspect of patient's right shoulder appears to be infected. The warm, raised appearance on physical exam along with the narrative of the region of erythema spreading is consistent with a bacterial infection of the skin. Doxycycline was prescribed.  Follow up in two days if no improvement noted or if erythema spreads.   Patient seen and discussed with PA-C student Azucena Freed, I agree with above assessment and plan.

## 2017-09-22 NOTE — Patient Instructions (Addendum)
     IF you received an x-ray today, you will receive an invoice from [facility]. Please contact [facility] at [phone] with questions or concerns regarding your invoice.   IF you received labwork today, you will receive an invoice from [facility]. Please contact [facility] at [phone] with questions or concerns regarding your invoice.   Our billing staff will not be able to assist you with questions regarding bills from these companies.  You will be contacted with the lab results as soon as they are available. The fastest way to get your results is to activate your My Chart account. Instructions are located on the last page of this paperwork. If you have not heard from us regarding the results in 2 weeks, please contact this office.     

## 2017-12-09 ENCOUNTER — Other Ambulatory Visit: Payer: Self-pay

## 2017-12-09 ENCOUNTER — Ambulatory Visit (INDEPENDENT_AMBULATORY_CARE_PROVIDER_SITE_OTHER): Payer: Medicare Other | Admitting: Family Medicine

## 2017-12-09 ENCOUNTER — Encounter: Payer: Self-pay | Admitting: Family Medicine

## 2017-12-09 VITALS — BP 130/70 | HR 65 | Temp 98.6°F | Ht 69.0 in | Wt 177.4 lb

## 2017-12-09 DIAGNOSIS — K644 Residual hemorrhoidal skin tags: Secondary | ICD-10-CM

## 2017-12-09 MED ORDER — HYDROCORTISONE ACETATE 25 MG RE SUPP: 25.0000 mg | Freq: Three times a day (TID) | RECTAL | 1 refills | Status: DC | PRN

## 2017-12-09 NOTE — Patient Instructions (Addendum)
Currently I do not see any thrombosed hemorrhoids.  I would recommend continuing the Anusol HC suppository, Tylenol if needed for discomfort, soaks and other recommendations as listed below.  If pain is not improving in the next week or worsening sooner, return for recheck.  Thank you for coming in today.   Hemorrhoids Hemorrhoids are swollen veins in and around the rectum or anus. There are two types of hemorrhoids:  Internal hemorrhoids. These occur in the veins that are just inside the rectum. They may poke through to the outside and become irritated and painful.  External hemorrhoids. These occur in the veins that are outside of the anus and can be felt as a painful swelling or hard lump near the anus.  Most hemorrhoids do not cause serious problems, and they can be managed with home treatments such as diet and lifestyle changes. If home treatments do not help your symptoms, procedures can be done to shrink or remove the hemorrhoids. What are the causes? This condition is caused by increased pressure in the anal area. This pressure may result from various things, including:  Constipation.  Straining to have a bowel movement.  Diarrhea.  Pregnancy.  Obesity.  Sitting for long periods of time.  Heavy lifting or other activity that causes you to strain.  Anal sex.  What are the signs or symptoms? Symptoms of this condition include:  Pain.  Anal itching or irritation.  Rectal bleeding.  Leakage of stool (feces).  Anal swelling.  One or more lumps around the anus.  How is this diagnosed? This condition can often be diagnosed through a visual exam. Other exams or tests may also be done, such as:  Examination of the rectal area with a gloved hand (digital rectal exam).  Examination of the anal canal using a small tube (anoscope).  A blood test, if you have lost a significant amount of blood.  A test to look inside the colon (sigmoidoscopy or colonoscopy).  How  is this treated? This condition can usually be treated at home. However, various procedures may be done if dietary changes, lifestyle changes, and other home treatments do not help your symptoms. These procedures can help make the hemorrhoids smaller or remove them completely. Some of these procedures involve surgery, and others do not. Common procedures include:  Rubber band ligation. Rubber bands are placed at the base of the hemorrhoids to cut off the blood supply to them.  Sclerotherapy. Medicine is injected into the hemorrhoids to shrink them.  Infrared coagulation. A type of light energy is used to get rid of the hemorrhoids.  Hemorrhoidectomy surgery. The hemorrhoids are surgically removed, and the veins that supply them are tied off.  Stapled hemorrhoidopexy surgery. A circular stapling device is used to remove the hemorrhoids and use staples to cut off the blood supply to them.  Follow these instructions at home: Eating and drinking  Eat foods that have a lot of fiber in them, such as whole grains, beans, nuts, fruits, and vegetables. Ask your health care provider about taking products that have added fiber (fiber supplements).  Drink enough fluid to keep your urine clear or pale yellow. Managing pain and swelling  Take warm sitz baths for 20 minutes, 3-4 times a day to ease pain and discomfort.  If directed, apply ice to the affected area. Using ice packs between sitz baths may be helpful. ? Put ice in a plastic bag. ? Place a towel between your skin and the bag. ? Leave the  ice on for 20 minutes, 2-3 times a day. General instructions  Take over-the-counter and prescription medicines only as told by your health care provider.  Use medicated creams or suppositories as told.  Exercise regularly.  Go to the bathroom when you have the urge to have a bowel movement. Do not wait.  Avoid straining to have bowel movements.  Keep the anal area dry and clean. Use wet toilet  paper or moist towelettes after a bowel movement.  Do not sit on the toilet for long periods of time. This increases blood pooling and pain. Contact a health care provider if:  You have increasing pain and swelling that are not controlled by treatment or medicine.  You have uncontrolled bleeding.  You have difficulty having a bowel movement, or you are unable to have a bowel movement.  You have pain or inflammation outside the area of the hemorrhoids. This information is not intended to replace advice given to you by your health care provider. Make sure you discuss any questions you have with your health care provider. Document Released: 06/26/2000 Document Revised: 11/27/2015 Document Reviewed: 03/13/2015 Elsevier Interactive Patient Education  2018 [employer].    IF you received an x-ray today, you will receive an invoice from [facility]. Please contact [facility] at [phone] with questions or concerns regarding your invoice.   IF you received labwork today, you will receive an invoice from [facility]. Please contact [facility] at [phone] with questions or concerns regarding your invoice.   Our billing staff will not be able to assist you with questions regarding bills from these companies.  You will be contacted with the lab results as soon as they are available. The fastest way to get your results is to activate your My Chart account. Instructions are located on the last page of this paperwork. If you have not heard from [place] regarding the results in 2 weeks, please contact this office.

## 2017-12-09 NOTE — Progress Notes (Signed)
Subjective:  By signing my name below, I, Anthony Juarez, attest that this documentation has been prepared under the direction and in the presence of Wendie Agreste, MD Electronically Signed: Ladene Artist, ED Scribe 12/09/2017 at 5:56 PM.   Patient ID: Anthony Juarez, male    DOB: 10/01/1945, 72 y.o.   MRN: [id number]  Chief Complaint  Patient presents with  . Hemrorhoids    3-4 days and feels today is the worst   HPI Anthony Juarez is a 72 y.o. male who presents to Primary Care at [facility] complaining of hemorrhoids first noticed 3-4 days ago, worsened this morning. Pt has tried Miralax to keep bowels soft, sitz baths, proctosol and 3 anusol HC suppositories which he last used this morning with some relief. Had been fighting a cold with some cough; used anti-tussive. Denies recent constipation but reports some trapped gas. Reports h/o hemorrhoids which he states resolved in fewer days. GI: Dr. Benson Norway; last visit in Nov. Colonoscopy was 6 yrs ago; rpt in 4 yrs.  Patient Active Problem List   Diagnosis Date Noted  . Abnormal finding on MRI of brain 12/04/2016  . Benign prostatic hyperplasia with incomplete bladder emptying 11/11/2016  . Hyperlipidemia 05/13/2016  . Esophageal reflux 03/28/2015  . Shortness of breath 03/28/2015  . Skin lesion of face 03/28/2015  . AVM (arteriovenous malformation) brain 06/15/2013  . Seizure disorder ([place]) 01/19/2013  . Central sleep apnea 06/10/2012   Past Medical History:  Diagnosis Date  . Arthritis   . Balanitis   . Benign prostatic hypertrophy   . Cholecystitis with cholelithiasis   . Convulsive disorder ([place])   . History of nuclear stress test    Myoview 11/16: EF 60%, normal perfusion, Low Risk  . Hyperlipidemia   . Hyperlipidemia   . OSA (obstructive sleep apnea)   . Prostatitis   . Seizures ([place])    Past Surgical History:  Procedure Laterality Date  . ELBOW SURGERY  11/11/2000   repair  . FRACTURE SURGERY     LEFT ELBOW  .  TONSILLECTOMY     Allergies  Allergen Reactions  . Penicillins Rash   Prior to Admission medications   Medication Sig Start Date End Date Taking? Authorizing Provider  albuterol (PROVENTIL HFA;VENTOLIN HFA) 108 (90 Base) MCG/ACT inhaler Inhale 2 puffs into the lungs every 4 (four) hours as needed for wheezing or shortness of breath (cough, shortness of breath or wheezing.). 06/01/17  Yes Shawnee Knapp, MD  doxazosin (CARDURA) 2 MG tablet Take 1 tablet (2 mg total) daily by mouth. 05/25/17  Yes Shawnee Knapp, MD  finasteride (PROSCAR) 5 MG tablet Take 1 tablet (5 mg total) daily by mouth. 05/25/17  Yes Shawnee Knapp, MD  fluticasone [facility]) 50 MCG/ACT nasal spray USE 2 SPRAYS IN EACH NOSTRIL AT BEDTIME. 05/25/17  Yes Shawnee Knapp, MD  Levetiracetam 750 MG TB24 Take 1 tablet (750 mg total) by mouth daily. Please call [phone] to schedule yearly follow up. 12/03/16  Yes Marcial Pacas, MD  montelukast (SINGULAIR) 10 MG tablet Take 1 tablet (10 mg total) at bedtime by mouth. 05/25/17  Yes Shawnee Knapp, MD  Multiple Vitamins-Minerals (MULTIVITAMIN WITH MINERALS) tablet Take 1 tablet by mouth daily.   Yes [provider]  polyethylene glycol powder (GLYCOLAX/MIRALAX) powder Take 17 g by mouth 2 (two) times daily as needed. 08/21/15  Yes Shawnee Knapp, MD   Social History   Socioeconomic History  . Marital status: Married  Spouse name: Not on file  . Number of children: Not on file  . Years of education: Not on file  . Highest education level: Not on file  Occupational History  . Occupation: retired    Fish farm manager: RETIRED  Social Needs  . Financial resource strain: Not on file  . Food insecurity:    Worry: Not on file    Inability: Not on file  . Transportation needs:    Medical: Not on file    Non-medical: Not on file  Tobacco Use  . Smoking status: Former Smoker    Packs/day: 1.00    Years: 10.00    Pack years: 10.00    Last attempt to quit: 07/13/1977    Years since quitting: 40.4  .  Smokeless tobacco: Never Used  Substance and Sexual Activity  . Alcohol use: Yes    Alcohol/week: 1.2 oz    Types: 2 Cans of beer per week    Comment: 2 beers a day  . Drug use: No  . Sexual activity: Not Currently  Lifestyle  . Physical activity:    Days per week: Not on file    Minutes per session: Not on file  . Stress: Not on file  Relationships  . Social connections:    Talks on phone: Not on file    Gets together: Not on file    Attends religious service: Not on file    Active member of club or organization: Not on file    Attends meetings of clubs or organizations: Not on file    Relationship status: Not on file  . Intimate partner violence:    Fear of current or ex partner: Not on file    Emotionally abused: Not on file    Physically abused: Not on file    Forced sexual activity: Not on file  Other Topics Concern  . Not on file  Social History Narrative   Patient worked in [employer].   Review of Systems  Gastrointestinal: Positive for rectal pain (hemorrhoids). Negative for constipation.      Objective:   Physical Exam  Constitutional: He is oriented to person, place, and time. He appears well-developed and well-nourished. No distress.  HENT:  Head: Normocephalic and atraumatic.  Eyes: Conjunctivae and EOM are normal.  Neck: Neck supple. No tracheal deviation present.  Cardiovascular: Normal rate.  Pulmonary/Chest: Effort normal. No respiratory distress.  Genitourinary: Rectal exam shows no fissure.  Genitourinary Comments: Few hemorrhoids approximately 3 to 6 o'clock. No fissures. No active bleeding. I do not palpate any thrombosed hemorrhoids.  Musculoskeletal: Normal range of motion.  Neurological: He is alert and oriented to person, place, and time.  Skin: Skin is warm and dry.  Psychiatric: He has a normal mood and affect. His behavior is normal.  Nursing note and vitals reviewed.  Vitals:   12/09/17 1722  BP: 130/70  Pulse: 65  Temp: 98.6  F (37 C)  TempSrc: Oral  SpO2: 98%  Weight: 177 lb 6.4 oz (80.5 kg)  Height: 5\' 9"  (1.753 m)      Assessment & Plan:    Anthony Juarez is a 72 y.o. male External hemorrhoids without complication - Plan: hydrocortisone (ANUSOL-HC) 25 MG suppository Hemorrhoids noted without apparent signs of thrombosis at this time.  Treatment options discussed.  We will continue warm soaks, Anusol HC up to 3 times per day as that appeared to have been helping some.  Offered other pain medication but declined at this time.  Over-the-counter  Tylenol if needed and RTC precautions discussed.  Meds ordered this encounter  Medications  . hydrocortisone (ANUSOL-HC) 25 MG suppository    Sig: Place 1 suppository (25 mg total) rectally 3 (three) times daily as needed for hemorrhoids or anal itching.    Dispense:  12 suppository    Refill:  1   Patient Instructions    Currently I do not see any thrombosed hemorrhoids.  I would recommend continuing the Anusol HC suppository, Tylenol if needed for discomfort, soaks and other recommendations as listed below.  If pain is not improving in the next week or worsening sooner, return for recheck.  Thank you for coming in today.   Hemorrhoids Hemorrhoids are swollen veins in and around the rectum or anus. There are two types of hemorrhoids:  Internal hemorrhoids. These occur in the veins that are just inside the rectum. They may poke through to the outside and become irritated and painful.  External hemorrhoids. These occur in the veins that are outside of the anus and can be felt as a painful swelling or hard lump near the anus.  Most hemorrhoids do not cause serious problems, and they can be managed with home treatments such as diet and lifestyle changes. If home treatments do not help your symptoms, procedures can be done to shrink or remove the hemorrhoids. What are the causes? This condition is caused by increased pressure in the anal area. This pressure may  result from various things, including:  Constipation.  Straining to have a bowel movement.  Diarrhea.  Pregnancy.  Obesity.  Sitting for long periods of time.  Heavy lifting or other activity that causes you to strain.  Anal sex.  What are the signs or symptoms? Symptoms of this condition include:  Pain.  Anal itching or irritation.  Rectal bleeding.  Leakage of stool (feces).  Anal swelling.  One or more lumps around the anus.  How is this diagnosed? This condition can often be diagnosed through a visual exam. Other exams or tests may also be done, such as:  Examination of the rectal area with a gloved hand (digital rectal exam).  Examination of the anal canal using a small tube (anoscope).  A blood test, if you have lost a significant amount of blood.  A test to look inside the colon (sigmoidoscopy or colonoscopy).  How is this treated? This condition can usually be treated at home. However, various procedures may be done if dietary changes, lifestyle changes, and other home treatments do not help your symptoms. These procedures can help make the hemorrhoids smaller or remove them completely. Some of these procedures involve surgery, and others do not. Common procedures include:  Rubber band ligation. Rubber bands are placed at the base of the hemorrhoids to cut off the blood supply to them.  Sclerotherapy. Medicine is injected into the hemorrhoids to shrink them.  Infrared coagulation. A type of light energy is used to get rid of the hemorrhoids.  Hemorrhoidectomy surgery. The hemorrhoids are surgically removed, and the veins that supply them are tied off.  Stapled hemorrhoidopexy surgery. A circular stapling device is used to remove the hemorrhoids and use staples to cut off the blood supply to them.  Follow these instructions at home: Eating and drinking  Eat foods that have a lot of fiber in them, such as whole grains, beans, nuts, fruits, and  vegetables. Ask your health care provider about taking products that have added fiber (fiber supplements).  Drink enough fluid to keep your  urine clear or pale yellow. Managing pain and swelling  Take warm sitz baths for 20 minutes, 3-4 times a day to ease pain and discomfort.  If directed, apply ice to the affected area. Using ice packs between sitz baths may be helpful. ? Put ice in a plastic bag. ? Place a towel between your skin and the bag. ? Leave the ice on for 20 minutes, 2-3 times a day. General instructions  Take over-the-counter and prescription medicines only as told by your health care provider.  Use medicated creams or suppositories as told.  Exercise regularly.  Go to the bathroom when you have the urge to have a bowel movement. Do not wait.  Avoid straining to have bowel movements.  Keep the anal area dry and clean. Use wet toilet paper or moist towelettes after a bowel movement.  Do not sit on the toilet for long periods of time. This increases blood pooling and pain. Contact a health care provider if:  You have increasing pain and swelling that are not controlled by treatment or medicine.  You have uncontrolled bleeding.  You have difficulty having a bowel movement, or you are unable to have a bowel movement.  You have pain or inflammation outside the area of the hemorrhoids. This information is not intended to replace advice given to you by your health care provider. Make sure you discuss any questions you have with your health care provider. Document Released: 06/26/2000 Document Revised: 11/27/2015 Document Reviewed: 03/13/2015 Elsevier Interactive Patient Education  2018 [employer].    IF you received an x-ray today, you will receive an invoice from [facility] Radiology. Please contact [facility] at [phone] with questions or concerns regarding your invoice.   IF you received labwork today, you will receive an invoice from [facility].  Please contact [facility] at [phone] with questions or concerns regarding your invoice.   Our billing staff will not be able to assist you with questions regarding bills from these companies.  You will be contacted with the lab results as soon as they are available. The fastest way to get your results is to activate your My Chart account. Instructions are located on the last page of this paperwork. If you have not heard from [place] regarding the results in 2 weeks, please contact this office.       I personally performed the services described in this documentation, which was scribed in my presence. The recorded information has been reviewed and considered for accuracy and completeness, addended by me as needed, and agree with information above.  Signed,   Merri Ray, MD Primary Care at [place].  12/11/17 11:10 AM

## 2017-12-10 ENCOUNTER — Telehealth: Payer: Self-pay | Admitting: Family Medicine

## 2017-12-10 NOTE — Telephone Encounter (Signed)
Copied from [place] [id number]. Topic: Quick Communication - Rx Refill/Question >> Dec 10, 2017  1:29 PM Scherrie Gerlach wrote: Medication:  proctosol cream for hemorrhoids  Pt seen yesterday. Pt thought dr Nyoka Cowden was going to call this in as well. Pt would like please [place], [place] [phone] (Phone) [phone] (Fax)

## 2017-12-13 NOTE — Telephone Encounter (Signed)
LVM advising pt that medication is at pharmacy.

## 2017-12-15 ENCOUNTER — Telehealth: Payer: Self-pay

## 2017-12-15 MED ORDER — HYDROCORTISONE 2.5 % RE CREA: 1.0000 "application " | TOPICAL_CREAM | Freq: Two times a day (BID) | RECTAL | 0 refills | Status: DC

## 2017-12-15 NOTE — Telephone Encounter (Signed)
I sent the Anusol [facility] rectal cream instead to see if that is less costly.  Let me know if other change needed

## 2017-12-15 NOTE — Telephone Encounter (Signed)
Pt came into clinic today and stated the suppository is too expensive for him. Would like external cream that would help with hemorrhoids.

## 2017-12-15 NOTE — Addendum Note (Signed)
Addended by: Carlota Raspberry, Siarah Deleo R on: 12/15/2017 04:00 PM   Modules accepted: Orders

## 2017-12-15 NOTE — Telephone Encounter (Signed)
Pt advised.

## 2018-03-03 ENCOUNTER — Other Ambulatory Visit: Payer: Self-pay | Admitting: Neurology

## 2018-04-13 ENCOUNTER — Other Ambulatory Visit: Payer: Self-pay | Admitting: Family Medicine

## 2018-04-13 NOTE — Telephone Encounter (Signed)
Approved 90 day supply and refills

## 2018-04-19 DIAGNOSIS — L814 Other melanin hyperpigmentation: Secondary | ICD-10-CM | POA: Diagnosis not present

## 2018-04-19 DIAGNOSIS — Z85828 Personal history of other malignant neoplasm of skin: Secondary | ICD-10-CM | POA: Diagnosis not present

## 2018-04-19 DIAGNOSIS — D1801 Hemangioma of skin and subcutaneous tissue: Secondary | ICD-10-CM | POA: Diagnosis not present

## 2018-04-19 DIAGNOSIS — L718 Other rosacea: Secondary | ICD-10-CM | POA: Diagnosis not present

## 2018-05-03 ENCOUNTER — Ambulatory Visit (INDEPENDENT_AMBULATORY_CARE_PROVIDER_SITE_OTHER): Payer: Medicare Other | Admitting: Family Medicine

## 2018-05-03 DIAGNOSIS — Z23 Encounter for immunization: Secondary | ICD-10-CM

## 2018-05-29 ENCOUNTER — Other Ambulatory Visit: Payer: Self-pay | Admitting: Neurology

## 2018-05-29 ENCOUNTER — Other Ambulatory Visit: Payer: Self-pay | Admitting: Family Medicine

## 2018-05-30 NOTE — Telephone Encounter (Signed)
Requested medication (s) are due for refill today: Yes  Requested medication (s) are on the active medication list: Yes  Last refill:  05/25/17 for both medications  Future visit scheduled: Yes  Notes to clinic: Expired Rx     Requested Prescriptions  Pending Prescriptions Disp Refills   doxazosin (CARDURA) 2 MG tablet [Pharmacy Med Name: DOXAZOSIN MESYLATE 2 MG TAB] 90 tablet 0    Sig: TAKE 1 TABLET ONCE DAILY.     Cardiovascular:  Alpha Blockers Passed - 05/29/2018 12:30 PM      Passed - Last BP in normal range    BP Readings from Last 1 Encounters:  12/09/17 130/70         Passed - Valid encounter within last 6 months    Recent Outpatient Visits          3 weeks ago Need for prophylactic vaccination and inoculation against influenza   [facility], Lilia Argue, MD   5 months ago External hemorrhoids without complication   [facility], Ranell Patrick, MD   8 months ago Cellulitis of other specified site   [facility], Lilia Argue, MD   1 year ago Annual physical exam   [facility], [name], MD   1 year ago Hyperlipidemia, unspecified hyperlipidemia type   [facility], [name], MD      Future Appointments            In 1 month Shawnee Knapp, MD Primary Care at [place], [place]          finasteride (PROSCAR) 5 MG tablet [Pharmacy Med Name: FINASTERIDE 5 MG TABLET] 90 tablet 0    Sig: TAKE 1 TABLET ONCE DAILY.     Urology: 5-alpha Reductase Inhibitors Passed - 05/29/2018 12:30 PM      Passed - Valid encounter within last 12 months    Recent Outpatient Visits          3 weeks ago Need for prophylactic vaccination and inoculation against influenza   [facility], Lilia Argue, MD   5 months ago External hemorrhoids without complication   [facility], Ranell Patrick, MD   8 months ago Cellulitis of other specified site   [facility], Lilia Argue, MD   1 year  ago Annual physical exam   [facility] at [facility], [name], MD   1 year ago Hyperlipidemia, unspecified hyperlipidemia type   [facility] at [facility], [name], MD      Future Appointments            In 1 month Shawnee Knapp, MD Primary Care at [place], [facility]

## 2018-06-03 ENCOUNTER — Telehealth: Payer: Self-pay | Admitting: Neurology

## 2018-06-03 MED ORDER — LEVETIRACETAM 750 MG PO TABS: ORAL_TABLET | ORAL | 1 refills | Status: DC

## 2018-06-03 NOTE — Telephone Encounter (Signed)
I called pt. He already has appt with Dr. Krista Blue 06/21/18 that he made back in September. He will keep this pending appt.   He would like new prescription to go to [facility].  E-scribed to pharmacy.

## 2018-06-03 NOTE — Telephone Encounter (Signed)
Spoke with Dr. Krista Blue. Pt last seen 11/2016. Ok to send in medication. He will need to make f/u for further refills.  Ok for him to see NP. We will call in levetiracetam 750mg  directions: 1/2 tab BID.

## 2018-06-03 NOTE — Telephone Encounter (Signed)
Pt states he is having difficulty in getting his refill on his Levetiracetam 750 MG TB24 since last week thru  [place], [place] (Phone) [phone] (Fax)   Pt has mentioned a back order on the extended relief.  Pt is asking that the pharmacy be contacted on his behalf because by Monday he will be in need of something

## 2018-06-03 NOTE — Addendum Note (Signed)
Addended by: Hope Pigeon on: 06/03/2018 11:36 AM   Modules accepted: Orders

## 2018-06-21 ENCOUNTER — Telehealth: Payer: Self-pay | Admitting: Neurology

## 2018-06-21 ENCOUNTER — Other Ambulatory Visit: Payer: Self-pay

## 2018-06-21 ENCOUNTER — Ambulatory Visit (INDEPENDENT_AMBULATORY_CARE_PROVIDER_SITE_OTHER): Payer: Medicare Other | Admitting: Neurology

## 2018-06-21 ENCOUNTER — Encounter: Payer: Self-pay | Admitting: Neurology

## 2018-06-21 VITALS — BP 133/79 | HR 53 | Resp 16 | Ht 69.0 in | Wt 176.0 lb

## 2018-06-21 DIAGNOSIS — G40909 Epilepsy, unspecified, not intractable, without status epilepticus: Secondary | ICD-10-CM | POA: Diagnosis not present

## 2018-06-21 DIAGNOSIS — Q282 Arteriovenous malformation of cerebral vessels: Secondary | ICD-10-CM

## 2018-06-21 MED ORDER — LAMOTRIGINE ER 200 MG PO TB24: 200.0000 mg | ORAL_TABLET | Freq: Every day | ORAL | 11 refills | Status: DC

## 2018-06-21 MED ORDER — LAMOTRIGINE 25 MG PO TABS: 25.0000 mg | ORAL_TABLET | Freq: Every day | ORAL | 0 refills | Status: DC

## 2018-06-21 NOTE — Telephone Encounter (Signed)
Kim with [employer] call stating the pt did not bring the rx for lamoTRIgine (LAMICTAL) 25 MG tablet requesting VO for medication please call at [phone].

## 2018-06-21 NOTE — Progress Notes (Signed)
PATIENT: Berlin Mokry DOB: 14-Jun-1946  Chief Complaint  Patient presents with  . AVM    Rm. 4.  Denies new sz.  Pharmacy was having trouble getting the [facility] in stock. He has been taking Keppra IR 750mg  bid instead of daily in the interim/fim  . Seizures     HISTORICAL  Mr. Putzier is a 72 yo WM, following up for seizure disorder  He has PMHx of seizure since infant, he was treated with dilantin and phenobarbital for a long time, was eventurally tapered off after seizure free since age 48, he had one recurrent seizure at age 59, was put back on dilantin 400mg  qhs, has been on it since.  He had Bachelor's degree, retired as Location manager, Insurance underwriter  in 2009, moved to [place] about 2.5 years ago, now he stays at home most of time, rarely initiated activities, felt fatigue, difficulty concontrating, getting worse since summer of 2013.  His wife complains that he is not up to his house projects anymore, frequent nocturia, which has prompted recent treatment with desmopression, which helped his frequent night time awaking,  He also has sleep apnea, but could not tolerate his BiPAP machine.He is seen by Dr. Gwenette Greet.  Dilantin level was 32 while he was taking Dilantin 100 mg 4 tablets each night, level decreased to 22 while he was taking Dilantin 100 mg 3 tablets each night, he has developed gingival hypertrophy, his brain fogginess has much improved with decreased dose of Dilantin, especially after he stopped taking Desopressin,   MRI scan of the brain showed a cavernous angioma with remote age hemorrhage in the right frontal subcortical region with  and adjacent  venous angioma.  There are moderate changes of chronic microvascular ischemia and mild degree of generalized cerebral atrophy.  Followup visit today patient has been switched to Keppra XR 750mg  daily. He is off Dilantin totally and his brain fogginess has improved. He has not had further seizure  activity.  UPDATE Jan 19th 2015: He is now taking keppra xr 750mg  qhs, last seizure 1992, nocturnal seizure,  he only has mild fatigue, no longer having brain foggy sensation We have reviewed MRI taking January 2015, continue the right inferior frontal cavernous venous angioma, mild atrophy, moderate small vessel disease,  UPDATE Jan 19th 2016; I have reviewed MRI brain (with and without): right frontal juxtacortical T2 heterogenous lesion (1.4x1.0cm), with "popcorn" appearance, consistent with cavernous malformation. There is an associated small developmental venous anomaly.  Multiple round and ovoid, periventricular, subcortical and juxtacortical T2 hyperintense foci. These findings are non-specific and considerations include autoimmune, inflammatory, post-infectious, microvascular ischemic or migraine associated etiologies.   He is taking Keppra XR 750 mg every night, there was no recurrent seizure  UPDATE Aug 01 2015: He had sleep study recently,  No need for CPAP machine, he is taking keppra xr 750mg  qhs, no recurrent seizure.   We have reviewed MRI of the brain with and without contrast in January 2017: 1. A focus of heterogenous signal in the superficial right frontal lobe with a hemosiderin rim and adjacent venous anomaly. This has characteristics of a cavernous malformation. Compared to the 07/20/2013 MRI, there has been no definite change. 2. Scattered T2/FLAIR hyperintense foci, predominantly in the subcortical deep white matter of both hemispheres. This is stable compared to the prior MRI. She is a nonspecific finding and potential etiology could be chronic microvascular ischemic change, migraine, chronic demyelination.   UPDATE Jun 21 2018: He is  overall doing well, taking Keppra 750 mg tablets half tablets twice a day, does complain some moodiness, discussed with patient we decided to switch him to lamotrigine 200 mg every day   REVIEW OF SYSTEMS: Full 14 system review of  systems performed and notable only for as above  ALLERGIES: Allergies  Allergen Reactions  . Penicillins Rash    HOME MEDICATIONS: Current Outpatient Medications  Medication Sig Dispense Refill  . albuterol (PROVENTIL HFA;VENTOLIN HFA) 108 (90 Base) MCG/ACT inhaler Inhale 2 puffs into the lungs every 4 (four) hours as needed for wheezing or shortness of breath (cough, shortness of breath or wheezing.). 1 Inhaler 2  . doxazosin (CARDURA) 2 MG tablet TAKE 1 TABLET ONCE DAILY. 30 tablet 0  . finasteride (PROSCAR) 5 MG tablet TAKE 1 TABLET ONCE DAILY. 30 tablet 0  . fluticasone (FLONASE) 50 MCG/ACT nasal spray USE 2 SPRAYS IN EACH NOSTRIL AT BEDTIME. 16 g 5  . levETIRAcetam (KEPPRA) 750 MG tablet Take 1/2 tablet twice daily 30 tablet 1  . montelukast (SINGULAIR) 10 MG tablet Take 1 tablet (10 mg total) at bedtime by mouth. 90 tablet 3  . Multiple Vitamins-Minerals (MULTIVITAMIN WITH MINERALS) tablet Take 1 tablet by mouth daily.    . Levetiracetam 750 MG TB24 TAKE 1 TABLET ONCE DAILY. (Patient not taking: Reported on 06/21/2018) 90 tablet 0   No current facility-administered medications for this visit.     PAST MEDICAL HISTORY: Past Medical History:  Diagnosis Date  . Arthritis   . Balanitis   . Benign prostatic hypertrophy   . Cholecystitis with cholelithiasis   . Convulsive disorder ([place])   . History of nuclear stress test    Myoview 11/16: EF 60%, normal perfusion, Low Risk  . Hyperlipidemia   . Hyperlipidemia   . OSA (obstructive sleep apnea)   . Prostatitis   . Seizures ([place])     PAST SURGICAL HISTORY: Past Surgical History:  Procedure Laterality Date  . ELBOW SURGERY  11/11/2000   repair  . FRACTURE SURGERY     LEFT ELBOW  . TONSILLECTOMY      FAMILY HISTORY: Family History  Problem Relation Age of Onset  . Heart disease Father        cabg 80's  . Cancer Father        Prostate  . Heart disease Maternal Grandfather        Late 23's  . Dementia Mother      SOCIAL HISTORY:  Social History   Socioeconomic History  . Marital status: Married    Spouse name: Not on file  . Number of children: Not on file  . Years of education: Not on file  . Highest education level: Not on file  Occupational History  . Occupation: retired    Fish farm manager: RETIRED  Social Needs  . Financial resource strain: Not on file  . Food insecurity:    Worry: Not on file    Inability: Not on file  . Transportation needs:    Medical: Not on file    Non-medical: Not on file  Tobacco Use  . Smoking status: Former Smoker    Packs/day: 1.00    Years: 10.00    Pack years: 10.00    Last attempt to quit: 07/13/1977    Years since quitting: 40.9  . Smokeless tobacco: Never Used  Substance and Sexual Activity  . Alcohol use: Yes    Alcohol/week: 2.0 standard drinks    Types: 2 Cans of beer per week  Comment: 2 beers a day  . Drug use: No  . Sexual activity: Not Currently  Lifestyle  . Physical activity:    Days per week: Not on file    Minutes per session: Not on file  . Stress: Not on file  Relationships  . Social connections:    Talks on phone: Not on file    Gets together: Not on file    Attends religious service: Not on file    Active member of club or organization: Not on file    Attends meetings of clubs or organizations: Not on file    Relationship status: Not on file  . Intimate partner violence:    Fear of current or ex partner: Not on file    Emotionally abused: Not on file    Physically abused: Not on file    Forced sexual activity: Not on file  Other Topics Concern  . Not on file  Social History Narrative   Patient worked in [employer].     PHYSICAL EXAM   Vitals:   06/21/18 0717  BP: 133/79  Pulse: (!) 53  Resp: 16  Weight: 176 lb (79.8 kg)  Height: 5\' 9"  (1.753 m)    Not recorded      Body mass index is 25.99 kg/m.  PHYSICAL EXAMNIATION:  Gen: NAD, conversant, well nourised, obese, well groomed                      Cardiovascular: Regular rate rhythm, no peripheral edema, warm, nontender. Eyes: Conjunctivae clear without exudates or hemorrhage Neck: Supple, no carotid bruise. Pulmonary: Clear to auscultation bilaterally   NEUROLOGICAL EXAM:  MMSE - Mini Mental State Exam 12/03/2016  Orientation to time 5  Orientation to Place 5  Registration 3  Attention/ Calculation 5  Recall 3  Language- name 2 objects 2  Language- repeat 1  Language- follow 3 step command 3  Language- read & follow direction 1  Write a sentence 1  Copy design 1  Total score 30   Animal naming 27  CRANIAL NERVES: CN II: Visual fields are full to confrontation. Pupils are round equal and briskly reactive to light. CN III, IV, VI: extraocular movement are normal. No ptosis. CN V: Facial sensation is intact to pinprick in all 3 divisions bilaterally. Corneal responses are intact.  CN VII: Face is symmetric with normal eye closure and smile. CN VIII: Hearing is normal to rubbing fingers CN IX, X: Palate elevates symmetrically. Phonation is normal. CN XI: Head turning and shoulder shrug are intact CN XII: Tongue is midline with normal movements and no atrophy.  MOTOR: There is no pronator drift of out-stretched arms. Muscle bulk and tone are normal. Muscle strength is normal.  REFLEXES: Reflexes are 2+ and symmetric at the biceps, triceps, knees, and ankles. Plantar responses are flexor.  SENSORY: Intact to light touch, pinprick, position sense, and vibration sense are intact in fingers and toes.  COORDINATION: Rapid alternating movements and fine finger movements are intact. There is no dysmetria on finger-to-nose and heel-knee-shin.    GAIT/STANCE: Posture is normal. Gait is steady with normal steps, base, arm swing, and turning. Heel and toe walking are normal. Tandem gait is normal.  Romberg is absent.   DIAGNOSTIC DATA (LABS, IMAGING, TESTING) - I reviewed patient records, labs, notes, testing and  imaging myself where available.   ASSESSMENT AND PLAN  Jatin Naumann is a 72 y.o. male   Epilepsy  Will switch to  lamotrigine titrating up from 25 mg to 200 mg ER daily  Right frontal cavernous angioma, supratentorium small vessel disease  Stable   Marcial Pacas, M.D. Ph.D.  [facility] [address], [place], [place] [address] Ph: [phone] Fax: [phone]  CC: Referring Provider

## 2018-06-21 NOTE — Telephone Encounter (Signed)
The patient was unable to locate the printed lamotrigine 25mg  prescription given to him at today's appt.  I returned the call to [facility] and she was provided with the titration instructions.  She will get it ready for him.  He has a separate prescription on file for generic, extended release Lamictal to start after he finishing titration with the lower dose.  Maudie Mercury is also aware of this plan.

## 2018-07-08 ENCOUNTER — Encounter: Payer: Self-pay | Admitting: Family Medicine

## 2018-07-08 ENCOUNTER — Ambulatory Visit (INDEPENDENT_AMBULATORY_CARE_PROVIDER_SITE_OTHER): Payer: Medicare Other | Admitting: Family Medicine

## 2018-07-08 VITALS — BP 135/77 | HR 52 | Temp 97.5°F | Resp 18 | Ht 69.0 in | Wt 175.6 lb

## 2018-07-08 DIAGNOSIS — Z136 Encounter for screening for cardiovascular disorders: Secondary | ICD-10-CM | POA: Diagnosis not present

## 2018-07-08 DIAGNOSIS — H9192 Unspecified hearing loss, left ear: Secondary | ICD-10-CM

## 2018-07-08 DIAGNOSIS — Z130 Encounter for screening for diseases of the blood and blood-forming organs and certain disorders involving the immune mechanism: Secondary | ICD-10-CM | POA: Diagnosis not present

## 2018-07-08 DIAGNOSIS — S99922A Unspecified injury of left foot, initial encounter: Secondary | ICD-10-CM | POA: Diagnosis not present

## 2018-07-08 DIAGNOSIS — E782 Mixed hyperlipidemia: Secondary | ICD-10-CM

## 2018-07-08 DIAGNOSIS — R001 Bradycardia, unspecified: Secondary | ICD-10-CM

## 2018-07-08 DIAGNOSIS — Z0001 Encounter for general adult medical examination with abnormal findings: Secondary | ICD-10-CM | POA: Diagnosis not present

## 2018-07-08 DIAGNOSIS — R3914 Feeling of incomplete bladder emptying: Secondary | ICD-10-CM

## 2018-07-08 DIAGNOSIS — Z0000 Encounter for general adult medical examination without abnormal findings: Secondary | ICD-10-CM

## 2018-07-08 DIAGNOSIS — Z1211 Encounter for screening for malignant neoplasm of colon: Secondary | ICD-10-CM | POA: Diagnosis not present

## 2018-07-08 DIAGNOSIS — N401 Enlarged prostate with lower urinary tract symptoms: Secondary | ICD-10-CM

## 2018-07-08 DIAGNOSIS — G4731 Primary central sleep apnea: Secondary | ICD-10-CM | POA: Diagnosis not present

## 2018-07-08 DIAGNOSIS — R198 Other specified symptoms and signs involving the digestive system and abdomen: Secondary | ICD-10-CM

## 2018-07-08 DIAGNOSIS — Z1212 Encounter for screening for malignant neoplasm of rectum: Secondary | ICD-10-CM | POA: Diagnosis not present

## 2018-07-08 DIAGNOSIS — J3089 Other allergic rhinitis: Secondary | ICD-10-CM

## 2018-07-08 DIAGNOSIS — R0989 Other specified symptoms and signs involving the circulatory and respiratory systems: Secondary | ICD-10-CM

## 2018-07-08 DIAGNOSIS — R233 Spontaneous ecchymoses: Secondary | ICD-10-CM

## 2018-07-08 DIAGNOSIS — Z1389 Encounter for screening for other disorder: Secondary | ICD-10-CM | POA: Diagnosis not present

## 2018-07-08 DIAGNOSIS — K219 Gastro-esophageal reflux disease without esophagitis: Secondary | ICD-10-CM

## 2018-07-08 DIAGNOSIS — Z1329 Encounter for screening for other suspected endocrine disorder: Secondary | ICD-10-CM

## 2018-07-08 DIAGNOSIS — Z1383 Encounter for screening for respiratory disorder NEC: Secondary | ICD-10-CM | POA: Diagnosis not present

## 2018-07-08 DIAGNOSIS — R238 Other skin changes: Secondary | ICD-10-CM | POA: Diagnosis not present

## 2018-07-08 DIAGNOSIS — M25552 Pain in left hip: Secondary | ICD-10-CM | POA: Diagnosis not present

## 2018-07-08 DIAGNOSIS — Z125 Encounter for screening for malignant neoplasm of prostate: Secondary | ICD-10-CM

## 2018-07-08 LAB — CBC WITH DIFFERENTIAL/PLATELET
Basophils Absolute: 0.1 10*3/uL (ref 0.0–0.2)
Basos: 2 %
EOS (ABSOLUTE): 0.1 10*3/uL (ref 0.0–0.4)
Eos: 2 %
Hematocrit: 43.1 % (ref 37.5–51.0)
Hemoglobin: 14.3 g/dL (ref 13.0–17.7)
IMMATURE GRANULOCYTES: 0 %
Immature Grans (Abs): 0 10*3/uL (ref 0.0–0.1)
Lymphocytes Absolute: 2.5 10*3/uL (ref 0.7–3.1)
Lymphs: 41 %
MCH: 30.2 pg (ref 26.6–33.0)
MCHC: 33.2 g/dL (ref 31.5–35.7)
MCV: 91 fL (ref 79–97)
MONOS ABS: 0.5 10*3/uL (ref 0.1–0.9)
Monocytes: 7 %
Neutrophils Absolute: 3 10*3/uL (ref 1.4–7.0)
Neutrophils: 48 %
Platelets: 287 10*3/uL (ref 150–450)
RBC: 4.74 x10E6/uL (ref 4.14–5.80)
RDW: 12.5 % (ref 12.3–15.4)
WBC: 6.2 10*3/uL (ref 3.4–10.8)

## 2018-07-08 MED ORDER — MONTELUKAST SODIUM 10 MG PO TABS: 10.0000 mg | ORAL_TABLET | Freq: Every day | ORAL | 3 refills | Status: DC

## 2018-07-08 MED ORDER — DOXAZOSIN MESYLATE 2 MG PO TABS: 2.0000 mg | ORAL_TABLET | Freq: Every day | ORAL | 3 refills | Status: DC

## 2018-07-08 MED ORDER — ZOSTER VAC RECOMB ADJUVANTED 50 MCG/0.5ML IM SUSR: 0.5000 mL | Freq: Once | INTRAMUSCULAR | 1 refills | Status: AC

## 2018-07-08 MED ORDER — FLUTICASONE PROPIONATE 50 MCG/ACT NA SUSP: NASAL | 5 refills | Status: DC

## 2018-07-08 MED ORDER — FINASTERIDE 5 MG PO TABS: 5.0000 mg | ORAL_TABLET | Freq: Every day | ORAL | 3 refills | Status: DC

## 2018-07-08 NOTE — Patient Instructions (Addendum)
Preventive Care 59 Years and Older, Male Preventive care refers to lifestyle choices and visits with your health care provider that can promote health and wellness. What does preventive care include?   A yearly physical exam. This is also called an annual well check.  Dental exams once or twice a year.  Routine eye exams. Ask your health care provider how often you should have your eyes checked.  Personal lifestyle choices, including: ? Daily care of your teeth and gums. ? Regular physical activity. ? Eating a healthy diet. ? Avoiding tobacco and drug use. ? Limiting alcohol use. ? Practicing safe sex. ? Taking low doses of aspirin every day. ? Taking vitamin and mineral supplements as recommended by your health care provider. What happens during an annual well check? The services and screenings done by your health care provider during your annual well check will depend on your age, overall health, lifestyle risk factors, and family history of disease. Counseling Your health care provider may ask you questions about your:  Alcohol use.  Tobacco use.  Drug use.  Emotional well-being.  Home and relationship well-being.  Sexual activity.  Eating habits.  History of falls.  Memory and ability to understand (cognition).  Work and work Statistician. Screening You may have the following tests or measurements:  Height, weight, and BMI.  Blood pressure.  Lipid and cholesterol levels. These may be checked every 5 years, or more frequently if you are over 9 years old.  Skin check.  Lung cancer screening. You may have this screening every year starting at age 59 if you have a 30-pack-year history of smoking and currently smoke or have quit within the past 15 years.  Colorectal cancer screening. All adults should have this screening starting at age 19 and continuing until age 68. You will have tests every 1-10 years, depending on your results and the type of  screening test. People at increased risk should start screening at an earlier age. Screening tests may include: ? Guaiac-based fecal occult blood testing. ? Fecal immunochemical test (FIT). ? Stool DNA test. ? Virtual colonoscopy. ? Sigmoidoscopy. During this test, a flexible tube with a tiny camera (sigmoidoscope) is used to examine your rectum and lower colon. The sigmoidoscope is inserted through your anus into your rectum and lower colon. ? Colonoscopy. During this test, a long, thin, flexible tube with a tiny camera (colonoscope) is used to examine your entire colon and rectum.  Prostate cancer screening. Recommendations will vary depending on your family history and other risks.  Hepatitis C blood test.  Hepatitis B blood test.  Sexually transmitted disease (STD) testing.  Diabetes screening. This is done by checking your blood sugar (glucose) after you have not eaten for a while (fasting). You may have this done every 1-3 years.  Abdominal aortic aneurysm (AAA) screening. You may need this if you are a current or former smoker.  Osteoporosis. You may be screened starting at age 68 if you are at high risk. Talk with your health care provider about your test results, treatment options, and if necessary, the need for more tests. Vaccines Your health care provider may recommend certain vaccines, such as:  Influenza vaccine. This is recommended every year.  Tetanus, diphtheria, and acellular pertussis (Tdap, Td) vaccine. You may need a Td booster every 10 years.  Varicella vaccine. You may need this if you have not been vaccinated.  Zoster vaccine. You may need this after age 93.  Measles, mumps, and  rubella (MMR) vaccine. You may need at least one dose of MMR if you were born in 1957 or later. You may also need a second dose.  Pneumococcal 13-valent conjugate (PCV13) vaccine. One dose is recommended after age 60.  Pneumococcal polysaccharide (PPSV23) vaccine. One dose is  recommended after age 75.  Meningococcal vaccine. You may need this if you have certain conditions.  Hepatitis A vaccine. You may need this if you have certain conditions or if you travel or work in places where you may be exposed to hepatitis A.  Hepatitis B vaccine. You may need this if you have certain conditions or if you travel or work in places where you may be exposed to hepatitis B.  Haemophilus influenzae type b (Hib) vaccine. You may need this if you have certain risk factors. Talk to your health care provider about which screenings and vaccines you need and how often you need them. This information is not intended to replace advice given to you by your health care provider. Make sure you discuss any questions you have with your health care provider. Document Released: 07/26/2015 Document Revised: 08/19/2017 Document Reviewed: 04/30/2015 Elsevier Interactive Patient Education  [employer].  If you have lab work done today you will be contacted with your lab results within the next 2 weeks.  If you have not heard from [place] then please contact us. The fastest way to get your results is to register for My Chart.   IF you received an x-ray today, you will receive an invoice from [facility]. Please contact [facility] at [phone] with questions or concerns regarding your invoice.   IF you received labwork today, you will receive an invoice from [facility]. Please contact [facility] at [phone] with questions or concerns regarding your invoice.   Our billing staff will not be able to assist you with questions regarding bills from these companies.  You will be contacted with the lab results as soon as they are available. The fastest way to get your results is to activate your My Chart account. Instructions are located on the last page of this paperwork. If you have not heard from [place] regarding the results in 2 weeks, please contact this office.      Health  Maintenance After Age 74 After age 69, you are at a higher risk for certain long-term diseases and infections as well as injuries from falls. Falls are a major cause of broken bones and head injuries in people who are older than age 80. Getting regular preventive care can help to keep you healthy and well. Preventive care includes getting regular testing and making lifestyle changes as recommended by your health care provider. Talk with your health care provider about:  Which screenings and tests you should have. A screening is a test that checks for a disease when you have no symptoms.  A diet and exercise plan that is right for you. What should I know about screenings and tests to prevent falls? Screening and testing are the best ways to find a health problem early. Early diagnosis and treatment give you the best chance of managing medical conditions that are common after age 17. Certain conditions and lifestyle choices may make you more likely to have a fall. Your health care provider may recommend:  Regular vision checks. Poor vision and conditions such as cataracts can make you more likely to have a fall. If you wear glasses, make sure to get your prescription updated if your vision changes.  Medicine review. Work with your health care provider to regularly review all of the medicines you are taking, including over-the-counter medicines. Ask your health care provider about any side effects that may make you more likely to have a fall. Tell your health care provider if any medicines that you take make you feel dizzy or sleepy.  Osteoporosis screening. Osteoporosis is a condition that causes the bones to get weaker. This can make the bones weak and cause them to break more easily.  Blood pressure screening. Blood pressure changes and medicines to control blood pressure can make you feel dizzy.  Strength and balance checks. Your health care provider may recommend certain tests to check your  strength and balance while standing, walking, or changing positions.  Foot health exam. Foot pain and numbness, as well as not wearing proper footwear, can make you more likely to have a fall.  Depression screening. You may be more likely to have a fall if you have a fear of falling, feel emotionally low, or feel unable to do activities that you used to do.  Alcohol use screening. Using too much alcohol can affect your balance and may make you more likely to have a fall. What actions can I take to lower my risk of falls? General instructions  Talk with your health care provider about your risks for falling. Tell your health care provider if: ? You fall. Be sure to tell your health care provider about all falls, even ones that seem minor. ? You feel dizzy, sleepy, or off-balance.  Take over-the-counter and prescription medicines only as told by your health care provider. These include any supplements.  Eat a healthy diet and maintain a healthy weight. A healthy diet includes low-fat dairy products, low-fat (lean) meats, and fiber from whole grains, beans, and lots of fruits and vegetables. Home safety  Remove any tripping hazards, such as rugs, cords, and clutter.  Install safety equipment such as grab bars in bathrooms and safety rails on stairs.  Keep rooms and walkways well-lit. Activity   Follow a regular exercise program to stay fit. This will help you maintain your balance. Ask your health care provider what types of exercise are appropriate for you.  If you need a cane or walker, use it as recommended by your health care provider.  Wear supportive shoes that have nonskid soles. Lifestyle  Do not drink alcohol if your health care provider tells you not to drink.  If you drink alcohol, limit how much you have: ? 0-1 drink a day for women. ? 0-2 drinks a day for men.  Be aware of how much alcohol is in your drink. In the [place] one drink equals one typical bottle of beer (12  oz), one-half glass of wine (5 oz), or one shot of hard liquor (1 oz).  Do not use any products that contain nicotine or tobacco, such as cigarettes and e-cigarettes. If you need help quitting, ask your health care provider. Summary  Having a healthy lifestyle and getting preventive care can help to protect your health and wellness after age 10.  Screening and testing are the best way to find a health problem early and help you avoid having a fall. Early diagnosis and treatment give you the best chance for managing medical conditions that are more common for people who are older than age 73.  Falls are a major cause of broken bones and head injuries in people who are older than age 32. Take precautions to prevent  a fall at home.  Work with your health care provider to learn what changes you can make to improve your health and wellness and to prevent falls. This information is not intended to replace advice given to you by your health care provider. Make sure you discuss any questions you have with your health care provider. Document Released: 05/12/2017 Document Revised: 05/12/2017 Document Reviewed: 05/12/2017 Elsevier Interactive Patient Education  2019 [employer].

## 2018-07-08 NOTE — Progress Notes (Signed)
Subjective:   Anthony Juarez is a 72 y.o. male who presents for Medicare Annual/Subsequent preventive examination.  Review of Systems:  Full 14 point ROS reviewed in detail and all positives noted below, all else negative.  Epilepsy: Lifelong. Seen by Dr. Ronny Flurry at least annually but more freq recently w/ last visit 2 wks ago as was changed from [place] to lamotrigine as might have been having mood issues, mild depression on the Keppra.  He seems to be tolerating lamotrigine well and not noticed to many changes yet.  Diarrhea and incomplete defecation: Used miralax previously and did help - but he is worried aobut dependening on it to much so stopped it and now sxs have recurred.  Saw Dr. Benson Norway about a year or two ago who did not think this warranted early colonoscopy and told him ok to repeat it in 10 years after Dr. Benson Norway had rereviewed the results of his 2012 colonoscopy at that visit. He has noticed an increased in intestinal gas - he has been more eating kale, dark greens   Worried about COPD that he saw in his chart mentioned on CXR. He is hiking more and tolerating this - does not seem to get any more winded than his wife or than he did prev. DOes not notice any exercise limitations. No chronic/recurrent cough. Peak flow 526 in 03/2015 with FEV1 and FVC at 100% predicted - he does not feel like his exercise ability/lung function has changed at all in the past 3 years.  Did have asthma as a child and does have pectus excavatum.  Occ joint pain in left hip after exercise. He is taking an ibuprofen 252m qhs for this and he is sleeping better and wanted to make sure this is ok which I reassured him that at this low of a dose I don't think it is likely to cause significant risk.  Pt does have a h/o GERD and dev pill dysphagia even while on nexium - had EGD by Dr. HBenson Norway2012  L great Toe pain s/p injury 3 wks ago he slid into a palate working at [employer] and slammed the tip of his left large  toe .  Was very painful and hurts like crazy, decreased after 12 hrs and still sore. Never became discolored or swollen.  Still very tender to the touch. We do not have xray available today but even if did have tuft frx at 3 wks out, would still recommend convervative management to try to allow to heal or at least stabilize and become asymptomatic. Rec shoes with wide toe box but that lace so cab fit snug around mid-foot/ankle so his foot is less likely to slide and hit toe against end repeatedly and also have very firm/thick/stiff sole for support. If still having any pain in another 3 wks - rec referral to ortho.  HLD: Has stopped taking a preventative aspirin as heard that it might not be worth the risks and he bruises so much and so easily.  No GERD/PUD sxs currently, no falls. Encouraged pt to restart asa but ok to take 2x/wk or qod to decrease bruising. Would be open to going back on a statin if cholesterol worsening. (was prev on simvastatin x 12-20 yrs but stopped 2016 due to concerns it was causing fatigue). + FHx CAD in father and GF. He did have a stress test 06/07/15 where they were unable to get his HR high enough on the treadmill - bradycardic at baseline in 40s-50s so  only got up to 81 after 10 min on treadmill and was limited further by [facility] so was converted to chemical but was low risk. Does have palpitations. Also checked apolipoprotein B last yr which was nml and hs-crp last yr was at low risk.  Hands and feet often get cold.  Difficulty hearing in left ear and tinnitus but not hugely bothering him as just turns to Rt ear is facing person speaking, worried about expense of hearing aides.  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 Towanda Octave allergic rhinitis: Staying on montelukast which he thinks is working as well as the flonase.  BPH w/ nocturia and urinary frequency: Much reduced sxs through lifestyle - limited fluids after dinner and will jsut wake  to void once.  Mouth sores   Nervous/anxious Objective:    Vitals: BP 135/77   Pulse (!) 52   Temp (!) 97.5 F (36.4 C) (Oral)   Resp 18   Ht _0  (1.753 m)   Wt 175 lb 9.6 oz (79.7 kg)   SpO2 99%   BMI 25.93 kg/m   Body mass index is 25.93 kg/m.  Advanced Directives 05/17/2017 11/12/2016  Does Patient Have a Medical Advance Directive? Yes Yes  Type of Paramedic of [place];Living will [place];Living will;Out of facility DNR (pink MOST or yellow form)  Copy of [place] in Chart? No - copy requested No - copy requested  TODAY IS SAME AS LAST YEARS - NOT SURE HOW TO DOCUMENT THIS IN Epic.  Tobacco Social History   Tobacco Use  Smoking Status Former Smoker  . Packs/day: 1.00  . Years: 10.00  . Pack years: 10.00  . Last attempt to quit: 07/13/1977  . Years since quitting: 41.0  Smokeless Tobacco Never Used     Counseling given: Not Answered   Clinical Intake:  Past Medical History:  Diagnosis Date  . Arthritis   . Balanitis   . Benign prostatic hypertrophy   . Cholecystitis with cholelithiasis   . Convulsive disorder ([place])   . History of nuclear stress test    Myoview 11/16: EF 60%, normal perfusion, Low Risk  . Hyperlipidemia   . Hyperlipidemia   . OSA (obstructive sleep apnea)   . Prostatitis   . Seizures ([place])    Past Surgical History:  Procedure Laterality Date  . ELBOW SURGERY  11/11/2000   repair  . FRACTURE SURGERY     LEFT ELBOW  . TONSILLECTOMY     Family History  Problem Relation Age of Onset  . Heart disease Father        cabg 21's  . Cancer Father        Prostate  . Heart disease Maternal Grandfather        Late 40's  . Dementia Mother    Social History   Socioeconomic History  . Marital status: Married    Spouse name: Not on file  . Number of children: Not on file  . Years of education: Not on file  . Highest education level: Not on file  Occupational History  .  Occupation: retired    Fish farm manager: RETIRED  Social Needs  . Financial resource strain: Not on file  . Food insecurity:    Worry: Not on file    Inability: Not on file  . Transportation needs:    Medical: Not on file    Non-medical: Not on file  Tobacco Use  .  Smoking status: Former Smoker    Packs/day: 1.00    Years: 10.00    Pack years: 10.00    Last attempt to quit: 07/13/1977    Years since quitting: 41.0  . Smokeless tobacco: Never Used  Substance and Sexual Activity  . Alcohol use: Yes    Alcohol/week: 2.0 standard drinks    Types: 2 Cans of beer per week    Comment: 2 beers a day  . Drug use: No  . Sexual activity: Not Currently  Lifestyle  . Physical activity:    Days per week: Not on file    Minutes per session: Not on file  . Stress: Not on file  Relationships  . Social connections:    Talks on phone: Not on file    Gets together: Not on file    Attends religious service: Not on file    Active member of club or organization: Not on file    Attends meetings of clubs or organizations: Not on file    Relationship status: Not on file  Other Topics Concern  . Not on file  Social History Narrative   Patient worked in [employer].    Outpatient Encounter Medications as of 07/08/2018  Medication Sig  . albuterol (PROVENTIL HFA;VENTOLIN HFA) 108 (90 Base) MCG/ACT inhaler Inhale 2 puffs into the lungs every 4 (four) hours as needed for wheezing or shortness of breath (cough, shortness of breath or wheezing.).  Marland Kitchen doxazosin (CARDURA) 2 MG tablet TAKE 1 TABLET ONCE DAILY.  . finasteride (PROSCAR) 5 MG tablet TAKE 1 TABLET ONCE DAILY.  . fluticasone (FLONASE) 50 MCG/ACT nasal spray USE 2 SPRAYS IN EACH NOSTRIL AT BEDTIME.  Marland Kitchen lamoTRIgine (LAMICTAL) 25 MG tablet Take 1 tablet (25 mg total) by mouth daily. 1 tablet twice a day for the first week 2 tablets twice a day for the second week 3 tablets twice a day for the third week 4 tablets twice a day for the fourth  week  For total of 140 tablets  After finish titration with small dose of lamotrigine 25 mg, change to lamotrigine 100 mg twice a day  . LamoTRIgine 200 MG TB24 24 hour tablet Take 1 tablet (200 mg total) by mouth at bedtime.  . montelukast (SINGULAIR) 10 MG tablet Take 1 tablet (10 mg total) at bedtime by mouth.  . Multiple Vitamins-Minerals (MULTIVITAMIN WITH MINERALS) tablet Take 1 tablet by mouth daily.  . [DISCONTINUED] levETIRAcetam (KEPPRA) 750 MG tablet Take 1/2 tablet twice daily  . [DISCONTINUED] Levetiracetam 750 MG TB24 TAKE 1 TABLET ONCE DAILY. (Patient not taking: Reported on 06/21/2018)   No facility-administered encounter medications on file as of 07/08/2018.     Activities of Daily Living In your present state of health, do you have any difficulty performing the following activities: 07/08/2018  Hearing? Y  Comment Lt>Rt - no prior testing  Vision? Y  Comment floater in Left eye; due for eye exam with ophtho Sanders  Difficulty concentrating or making decisions? N  Comment improving  Walking or climbing stairs? N  Dressing or bathing? N  Doing errands, shopping? N  Some recent data might be hidden    Patient Care Team: Shawnee Knapp, MD as PCP - General (Family Medicine) Marcial Pacas, MD as Consulting Physician (Neurology) Sherlynn Stalls, MD as Consulting Physician (Ophthalmology) [facility] as Consulting Physician Carol Ada, MD as Consulting Physician (Gastroenterology)   Assessment:   This is a routine wellness examination for Baker.  Exercise Activities  and Dietary recommendations  Going to restart going to [employer] twice a week and doing some of the exercise videos with is wife  Fall Risk Fall Risk  07/08/2018 12/09/2017 09/22/2017 05/20/2017 05/17/2017  Falls in the past year? 0 No No No No  Number falls in past yr: - - - - -  Injury with Fall? - - - - -  Comment - - - - -   Is the patient's home free of loose throw rugs in walkways,  pet beds, electrical cords, etc?   no- throw rugs in the bathroom      Grab bars in the bathroom? no      Handrails on the stairs?   yes      Adequate lighting?   yes  Timed Get Up and Go Performed: normal  Depression Screen PHQ 2/9 Scores 07/08/2018 12/09/2017 09/22/2017 05/20/2017  PHQ - 2 Score 0 0 0 0    Cognitive Function MMSE - Mini Mental State Exam 12/03/2016  Orientation to time 5  Orientation to Place 5  Registration 3  Attention/ Calculation 5  Recall 3  Language- name 2 objects 2  Language- repeat 1  Language- follow 3 step command 3  Language- read & follow direction 1  Write a sentence 1  Copy design 1  Total score 30     6CIT Screen 07/08/2018 05/17/2017  What Year? 0 points 0 points  What month? 0 points 0 points  What time? 0 points 0 points  Count back from 20 0 points 0 points  Months in reverse 0 points 0 points  Repeat phrase 0 points 2 points  Total Score 0 2   Physical Exam Constitutional:      General: He is not in acute distress.    Appearance: He is well-developed. He is not diaphoretic.  HENT:     Head: Normocephalic and atraumatic.     Right Ear: Tympanic membrane, ear canal and external ear normal. Mild-mod cerumen    Left Ear: Tympanic membrane, ear canal and external ear normal.     Nose: Turbinates enlarged, moist, mild erythema    Mouth/Throat:     Pharynx: Uvula midline. No oropharyngeal exudate.  Eyes:     General: No scleral icterus.       Right eye: No discharge.        Left eye: No discharge.     Conjunctiva/sclera: Conjunctivae normal.  Neck:     Musculoskeletal: Neck supple.     Thyroid: No thyromegaly.  Cardiovascular:     Rate and Rhythm: Mildly bradycardic rate and regular rhythm.     Heart sounds: Split S1.  Pulmonary:     Effort: Pulmonary effort is normal. No respiratory distress.     Breath sounds: Normal breath sounds.  Abdominal:     General: Bowel sounds are normal. There is no distension.     Palpations:  Abdomen is soft. There is no mass.     Tenderness: There is no abdominal tenderness. There is no guarding or rebound.  Genitourinary:    Prostate: Mildly enlarged, firm, symmetrical, not tender and no nodules present.     Rectum: Normal. No mass. +non-inflammed hemorrhoids. Normal anal tone.  Lymphadenopathy:     Cervical: No cervical adenopathy.  Skin:    General: Skin is warm and dry.     Findings: No erythema or rash.  Neurological:     Mental Status: He is alert and oriented to person, place, and time.  Cranial Nerves: No cranial nerve deficit.     Motor: No abnormal muscle tone. Gait normal.    Deep Tendon Reflexes: Reflexes are normal and symmetric.  Psychiatric:        Behavior: Behavior normal.    Immunization History  Administered Date(s) Administered  . DTaP 01/15/2011  . Influenza Whole 05/22/2011, 04/12/2012  . Influenza, High Dose Seasonal PF 05/03/2018  . Influenza,inj,Quad PF,6+ Mos 03/28/2015, 05/14/2016, 05/17/2017  . Pneumococcal Conjugate-13 03/28/2015  . Pneumococcal Polysaccharide-23 01/12/2014  . Td 05/14/2016  . Zoster 01/29/2014    Qualifies for Shingles Vaccine? Yes  Screening Tests Health Maintenance  Topic Date Due  . COLONOSCOPY  03/25/2021  . TETANUS/TDAP  05/14/2026  . INFLUENZA VACCINE  Completed  . Hepatitis C Screening  Completed  . PNA vac Low Risk Adult  Completed   Cancer Screenings: Lung: Low Dose CT Chest recommended if Age 27-80 years, 30 pack-year currently smoking OR have quit w/in 15years. Patient does not qualify. Colorectal: not due to 2022 per pt discussion with Dr. Benson Norway 2 years ago  Additional Screenings:  Hepatitis C Screening: UTD     EKG: Sinus bradycardia HR 48, no acute ischemic changes noted. No significant change noted when compared to prior EKG done 03/24/2015.   I have personally reviewed the EKG tracing and agree with the computer interpretation. Marked sinus  Bradycardia  Low voltage in limb leads.    ABNORMAL  Plan:    UA dip WAS NOT RUN AT VISIT - HAD TO CANCEL ORDER - CHECK AT NEXT OV.  1. Medicare annual wellness visit, subsequent   2. Screening for colorectal cancer   3. Screening for cardiovascular, respiratory, and genitourinary diseases   4. Screening for thyroid disorder   5. Screening for deficiency anemia   6. Screening for prostate cancer   7. Benign prostatic hyperplasia with incomplete bladder emptying - well-controlled w/ lifestyle changes and current proscar 5 and cardura 2- continue both. Has not felt like he needed to see urology prior.  8. Mixed hyperlipidemia - consider restarting statin if recommended after todays labs. Lipids today up. Hs-crp is still very low which is reassuring but 10 yrs ASCVD risk score of 16.5% rec moderate intensity statin w/ a primary choice being atorvastatin 10 with goal of 30% LDL lowering (optimal ASCVD risk is 13.1%)  9. Gastroesophageal reflux disease, esophagitis presence not specified - not needed meds currenly and asymptomatic - watch for exacerbation now that he is taking ibuprofen 200 qhs.  10. Hearing loss of left ear, unspecified hearing loss type -and tinnitus -  refer to [facility] Aud[facility] for baseline screening  11. Injury of great toe of left foot, initial encounter - has been 3 wks, no xray avail today - cont watchful waiting - discussed protective footwear to support healing. If still painful in 3 more weeks, call for ortho referral  12. Easy bruising - chronic due to aging, restart asa 52m EC 2x/wk  13. Chronic sinus bradycardia - unchanged  14. Abnormal bowel movement - h/o constipation that has developed in last sev yrs and presents as diarrhea and incomplete defecation - resolved w/ miralax but recurs when stops, reassured pt no problems w/ staying on miralax daily. Encouraged pt that I think he needs to go back to Dr. HBenson Norwayand ensure these changes over the past sev yrs don't require an early colonoscopy as such changes could  potentially have obstructive mass/ I.e. cancer as a cause of the change but pt declines at  this time and prefers to see if things resolve back to normal formed BM with the readdition of daily miralax.  15. Central sleep apnea - PNSG 2014 shows mild and no medical reason to need treat which is good as pt could not tol cpap/bipap  16.    Arthralgia of left hip - reasonable to use ibuprofen 270m qhs since such a low dose is providing such sig relief. 17.    Hyperinflation of lungs - seen on CXR - pt does not qualify for LDCT or screening. Prior PFTs/spirometry were perfect and pt declines to repeat today as has not noticed any changes in exercise tolerance, no sxs. May be due to body habitus and pectus excavatum causing less thoracic space which causes lungs to push diaphragm down more since cant expand outward as much??  Also poss h/o childhood asthma and CSA may be contributing but testing does not show any need for trx of either at this time so cont wacthful waiting. 18.     Non-seasonal allergic rhinitis, unspecified trigger - cont flonase and singulair - keeps sxs well controlled.  I have personally reviewed and noted the following in the patient's chart:   . Medical and social history . Use of alcohol, tobacco or illicit drugs  . Current medications and supplements . Functional ability and status . Nutritional status . Physical activity . Advanced directives . List of other physicians . Hospitalizations, surgeries, and ER visits in previous 12 months . Vitals . Screenings to include cognitive, depression, and falls . Referrals and appointments  In addition, I have reviewed and discussed with patient certain preventive protocols, quality metrics, and best practice recommendations. A written personalized care plan for preventive services as well as general preventive health recommendations were provided to patient.     Orders Placed This Encounter  Procedures  . Lipid panel  . TSH  .  CMP14+EGFR  . High sensitivity CRP  . PSA Total (Reflex To Free)  . CBC with Differential/Platelet  . Ambulatory referral to Audiology    Referral Priority:   Routine    Referral Type:   Audiology Exam    Referral Reason:   Specialty Services Required    Number of Visits Requested:   1  . POCT urinalysis dipstick  . EKG 12-Lead    Meds ordered this encounter  Medications  . Zoster Vaccine Adjuvanted ([facility] injection    Sig: Inject 0.5 mLs into the muscle once for 1 dose. Repeat once in 2 to 6 months    Dispense:  1 each    Refill:  1  . doxazosin (CARDURA) 2 MG tablet    Sig: Take 1 tablet (2 mg total) by mouth daily.    Dispense:  90 tablet    Refill:  3  . finasteride (PROSCAR) 5 MG tablet    Sig: Take 1 tablet (5 mg total) by mouth daily.    Dispense:  90 tablet    Refill:  3  . montelukast (SINGULAIR) 10 MG tablet    Sig: Take 1 tablet (10 mg total) by mouth at bedtime.    Dispense:  90 tablet    Refill:  3  . fluticasone (FLONASE) 50 MCG/ACT nasal spray    Sig: USE 2 SPRAYS IN EACH NOSTRIL AT BEDTIME.    Dispense:  16 g    Refill:  5    EDelman Cheadle MD, MPH [facility] [place]  [phone] Office phone  [phone] Office fax   07/09/18 7:02 AM   Shawnee Knapp, MD  07/08/2018  Patient ID: Anthony Juarez, male   DOB: [birth year], 72 y.o.   MRN: [id number]

## 2018-07-09 ENCOUNTER — Encounter: Payer: Self-pay | Admitting: Family Medicine

## 2018-07-09 DIAGNOSIS — R198 Other specified symptoms and signs involving the digestive system and abdomen: Secondary | ICD-10-CM | POA: Insufficient documentation

## 2018-07-09 DIAGNOSIS — R0989 Other specified symptoms and signs involving the circulatory and respiratory systems: Secondary | ICD-10-CM | POA: Insufficient documentation

## 2018-07-09 DIAGNOSIS — J3089 Other allergic rhinitis: Secondary | ICD-10-CM | POA: Insufficient documentation

## 2018-07-09 DIAGNOSIS — R001 Bradycardia, unspecified: Secondary | ICD-10-CM

## 2018-07-09 HISTORY — DX: Bradycardia, unspecified: R00.1

## 2018-07-09 LAB — CMP14+EGFR
ALT: 10 IU/L (ref 0–44)
AST: 24 IU/L (ref 0–40)
Albumin/Globulin Ratio: 2.1 (ref 1.2–2.2)
Albumin: 4.2 g/dL (ref 3.5–4.8)
Alkaline Phosphatase: 51 IU/L (ref 39–117)
BUN/Creatinine Ratio: 14 (ref 10–24)
BUN: 15 mg/dL (ref 8–27)
Bilirubin Total: 0.6 mg/dL (ref 0.0–1.2)
CO2: 22 mmol/L (ref 20–29)
Calcium: 9.6 mg/dL (ref 8.6–10.2)
Chloride: 101 mmol/L (ref 96–106)
Creatinine, Ser: 1.07 mg/dL (ref 0.76–1.27)
GFR calc Af Amer: 81 mL/min/{1.73_m2} (ref >59)
GFR calc non Af Amer: 70 mL/min/{1.73_m2} (ref >59)
Globulin, Total: 2 g/dL (ref 1.5–4.5)
Glucose: 97 mg/dL (ref 65–99)
Potassium: 4.5 mmol/L (ref 3.5–5.2)
SODIUM: 138 mmol/L (ref 134–144)
Total Protein: 6.2 g/dL (ref 6.0–8.5)

## 2018-07-09 LAB — HIGH SENSITIVITY CRP: CRP, High Sensitivity: 0.32 mg/L (ref 0.00–3.00)

## 2018-07-09 LAB — LIPID PANEL
Chol/HDL Ratio: 2.6 ratio (ref 0.0–5.0)
Cholesterol, Total: 245 mg/dL — ABNORMAL HIGH (ref 100–199)
HDL: 95 mg/dL (ref >39)
LDL Calculated: 140 mg/dL — ABNORMAL HIGH (ref 0–99)
Triglycerides: 52 mg/dL (ref 0–149)
VLDL CHOLESTEROL CAL: 10 mg/dL (ref 5–40)

## 2018-07-09 LAB — PSA TOTAL (REFLEX TO FREE): Prostate Specific Ag, Serum: 0.5 ng/mL (ref 0.0–4.0)

## 2018-07-09 LAB — TSH: TSH: 3.65 u[IU]/mL (ref 0.450–4.500)

## 2018-07-09 MED ORDER — ATORVASTATIN CALCIUM 10 MG PO TABS: 10.0000 mg | ORAL_TABLET | Freq: Every day | ORAL | 3 refills | Status: DC

## 2018-08-17 DIAGNOSIS — K219 Gastro-esophageal reflux disease without esophagitis: Secondary | ICD-10-CM | POA: Diagnosis not present

## 2018-08-17 DIAGNOSIS — R1310 Dysphagia, unspecified: Secondary | ICD-10-CM | POA: Diagnosis not present

## 2018-08-17 DIAGNOSIS — R142 Eructation: Secondary | ICD-10-CM | POA: Diagnosis not present

## 2018-08-17 DIAGNOSIS — R140 Abdominal distension (gaseous): Secondary | ICD-10-CM | POA: Diagnosis not present

## 2018-08-24 DIAGNOSIS — K219 Gastro-esophageal reflux disease without esophagitis: Secondary | ICD-10-CM | POA: Diagnosis not present

## 2018-08-24 DIAGNOSIS — R1310 Dysphagia, unspecified: Secondary | ICD-10-CM | POA: Diagnosis not present

## 2018-08-24 DIAGNOSIS — K222 Esophageal obstruction: Secondary | ICD-10-CM | POA: Diagnosis not present

## 2018-08-24 LAB — HM COLONOSCOPY

## 2018-08-28 ENCOUNTER — Encounter (HOSPITAL_COMMUNITY): Payer: Self-pay | Admitting: Emergency Medicine

## 2018-08-28 ENCOUNTER — Emergency Department (HOSPITAL_COMMUNITY): Payer: Medicare Other

## 2018-08-28 ENCOUNTER — Other Ambulatory Visit: Payer: Self-pay

## 2018-08-28 ENCOUNTER — Emergency Department (HOSPITAL_COMMUNITY)
Admission: EM | Admit: 2018-08-28 | Discharge: 2018-08-28 | Disposition: A | Discharge status: Home / Self Care | Payer: Medicare Other | Attending: Emergency Medicine | Admitting: Emergency Medicine

## 2018-08-28 DIAGNOSIS — Z79899 Other long term (current) drug therapy: Secondary | ICD-10-CM | POA: Insufficient documentation

## 2018-08-28 DIAGNOSIS — Z87891 Personal history of nicotine dependence: Secondary | ICD-10-CM | POA: Diagnosis not present

## 2018-08-28 DIAGNOSIS — R079 Chest pain, unspecified: Secondary | ICD-10-CM | POA: Insufficient documentation

## 2018-08-28 LAB — POCT I-STAT TROPONIN I
Troponin i, poc: 0 ng/mL (ref 0.00–0.08)
Troponin i, poc: 0 ng/mL (ref 0.00–0.08)

## 2018-08-28 LAB — BASIC METABOLIC PANEL
Anion gap: 8 (ref 5–15)
BUN: 18 mg/dL (ref 8–23)
CO2: 25 mmol/L (ref 22–32)
Calcium: 8.9 mg/dL (ref 8.9–10.3)
Chloride: 103 mmol/L (ref 98–111)
Creatinine, Ser: 0.91 mg/dL (ref 0.61–1.24)
GFR calc Af Amer: >60 mL/min (ref >60)
GFR calc non Af Amer: >60 mL/min (ref >60)
Glucose, Bld: 92 mg/dL (ref 70–99)
Potassium: 4 mmol/L (ref 3.5–5.1)
Sodium: 136 mmol/L (ref 135–145)

## 2018-08-28 LAB — CBC
HCT: 43 % (ref 39.0–52.0)
Hemoglobin: 13.9 g/dL (ref 13.0–17.0)
MCH: 30.1 pg (ref 26.0–34.0)
MCHC: 32.3 g/dL (ref 30.0–36.0)
MCV: 93.1 fL (ref 80.0–100.0)
Platelets: 282 10*3/uL (ref 150–400)
RBC: 4.62 MIL/uL (ref 4.22–5.81)
RDW: 13 % (ref 11.5–15.5)
WBC: 8.7 10*3/uL (ref 4.0–10.5)
nRBC: 0 % (ref 0.0–0.2)

## 2018-08-28 MED ORDER — SODIUM CHLORIDE 0.9 % IV BOLUS
1000.0000 mL | Freq: Once | INTRAVENOUS | Status: AC
  Administered 2018-08-28: 1000 mL via INTRAVENOUS

## 2018-08-28 MED ORDER — SODIUM CHLORIDE 0.9% FLUSH: 3.0000 mL | Freq: Once | INTRAVENOUS | Status: DC

## 2018-08-28 NOTE — ED Provider Notes (Signed)
[facility] DEPT Provider Note   CSN: [id number] Arrival date & time: 08/28/18  1314     History   Chief Complaint Chief Complaint  Patient presents with  . Chest Pain    HPI Nickey Kloepfer is a 73 y.o. male.  Brach Birdsall is a 73 y.o. male with a history of seizures, BPH, hyperlipidemia, GERD, asthma and sleep apnea, who presents to the emergency department for evaluation of chest pain.  Patient reports about an hour prior to arrival he noted left-sided chest pain and pressure upon standing up this afternoon.  The pain seemed to be relieved with rest. Pain has resolved with no recurrence.  He denies any radiation to the arm neck or jaw.  Pain is not pleuritic in nature and patient denies any associated shortness of breath, lightheadedness or syncope.  No lower extremity swelling or pain.  Denies fever cough.  Patient reports he has had episodes of chest pain in the past but this 1 seemed more severe.  He also reports that he was recently diagnosed with acid reflux, but is not on any medications for this.  He did occur about 30 minutes after eating lunch.  He denies any abdominal pain, nausea, vomiting or diaphoresis.  Patient is a former smoker and has a 10-pack-year smoking history, aside from age and hyperlipidemia no other cardiac risk factors.  No personal history of cardiac events.  Drinks beer occasionally, but denies any drug use.  Does follow with Dr. Marlou Porch with cardiology for chronic bradycardia     Past Medical History:  Diagnosis Date  . Allergy   . Arthritis   . Asthma    childhood  . AVM (arteriovenous malformation) brain 06/15/2013  . Balanitis   . Benign prostatic hypertrophy   . Central sleep apnea   . Cholecystitis with cholelithiasis   . Chronic sinus bradycardia 07/09/2018  . Convulsive disorder ([place])   . Esophageal reflux 03/28/2015  . History of nuclear stress test    Myoview 11/16: EF 60%, normal perfusion, Low Risk  .  Hyperlipidemia   . Prostatitis   . Seizures [facility])     Patient Active Problem List   Diagnosis Date Noted  . Chronic sinus bradycardia 07/09/2018  . Abnormal bowel movement 07/09/2018  . Hyperinflation of lungs 07/09/2018  . Non-seasonal allergic rhinitis 07/09/2018  . Abnormal finding on MRI of brain 12/04/2016  . Benign prostatic hyperplasia with incomplete bladder emptying 11/11/2016  . Hyperlipidemia 05/13/2016  . Esophageal reflux 03/28/2015  . Shortness of breath 03/28/2015  . Skin lesion of face 03/28/2015  . AVM (arteriovenous malformation) brain 06/15/2013  . Seizure disorder ([place]) 01/19/2013  . Central sleep apnea 06/10/2012    Past Surgical History:  Procedure Laterality Date  . ELBOW SURGERY  11/11/2000   repair  . FRACTURE SURGERY     LEFT ELBOW  . TONSILLECTOMY          Home Medications    Prior to Admission medications   Medication Sig Start Date End Date Taking? Authorizing Provider  albuterol (PROVENTIL HFA;VENTOLIN HFA) 108 (90 Base) MCG/ACT inhaler Inhale 2 puffs into the lungs every 4 (four) hours as needed for wheezing or shortness of breath (cough, shortness of breath or wheezing.). 06/01/17   Shawnee Knapp, MD  atorvastatin (LIPITOR) 10 MG tablet Take 1 tablet (10 mg total) by mouth daily. 07/09/18   Shawnee Knapp, MD  doxazosin (CARDURA) 2 MG tablet Take 1 tablet (2 mg total) by mouth  daily. 07/08/18   Shawnee Knapp, MD  finasteride (PROSCAR) 5 MG tablet Take 1 tablet (5 mg total) by mouth daily. 07/08/18   Shawnee Knapp, MD  fluticasone [facility]) 50 MCG/ACT nasal spray USE 2 SPRAYS IN EACH NOSTRIL AT BEDTIME. 07/08/18   Shawnee Knapp, MD  lamoTRIgine (LAMICTAL) 25 MG tablet Take 1 tablet (25 mg total) by mouth daily. 1 tablet twice a day for the first week 2 tablets twice a day for the second week 3 tablets twice a day for the third week 4 tablets twice a day for the fourth week  For total of 140 tablets  After finish titration with small dose of  lamotrigine 25 mg, change to lamotrigine 100 mg twice a day 06/21/18   Marcial Pacas, MD  LamoTRIgine 200 MG TB24 24 hour tablet Take 1 tablet (200 mg total) by mouth at bedtime. 06/21/18   Marcial Pacas, MD  montelukast (SINGULAIR) 10 MG tablet Take 1 tablet (10 mg total) by mouth at bedtime. 07/08/18   Shawnee Knapp, MD  Multiple Vitamins-Minerals (MULTIVITAMIN WITH MINERALS) tablet Take 1 tablet by mouth daily.    [provider]    Family History Family History  Problem Relation Age of Onset  . Heart disease Father        cabg 26's  . Cancer Father        Prostate  . Heart disease Maternal Grandfather        Late 15's  . Dementia Mother     Social History Social History   Tobacco Use  . Smoking status: Former Smoker    Packs/day: 1.00    Years: 10.00    Pack years: 10.00    Last attempt to quit: 07/13/1977    Years since quitting: 41.1  . Smokeless tobacco: Never Used  Substance Use Topics  . Alcohol use: Yes    Alcohol/week: 2.0 standard drinks    Types: 2 Cans of beer per week    Comment: 2 beers a day  . Drug use: No     Allergies   Penicillins   Review of Systems Review of Systems  Constitutional: Negative for chills and fever.  HENT: Negative.   Eyes: Negative for visual disturbance.  Respiratory: Negative for cough, chest tightness and shortness of breath.   Cardiovascular: Positive for chest pain. Negative for palpitations and leg swelling.  Gastrointestinal: Negative for abdominal pain, constipation, diarrhea, nausea and vomiting.  Genitourinary: Negative for dysuria and frequency.  Musculoskeletal: Negative for arthralgias and myalgias.  Skin: Negative for color change and rash.  Neurological: Negative for dizziness, syncope, light-headedness and headaches.     Physical Exam Updated Vital Signs Pulse (!) 57   Temp 97.8 F (36.6 C) (Oral)   Resp 18   SpO2 100%   Physical Exam Vitals signs and nursing note reviewed.  Constitutional:       General: He is not in acute distress.    Appearance: He is well-developed and normal weight. He is not ill-appearing or diaphoretic.  HENT:     Head: Normocephalic and atraumatic.  Eyes:     General:        Right eye: No discharge.        Left eye: No discharge.     Pupils: Pupils are equal, round, and reactive to light.  Neck:     Musculoskeletal: Neck supple.     Trachea: No tracheal deviation.  Cardiovascular:     Rate and Rhythm: Normal  rate and regular rhythm.     Pulses:          Radial pulses are 2+ on the right side and 2+ on the left side.       Dorsalis pedis pulses are 2+ on the right side and 2+ on the left side.       Posterior tibial pulses are 2+ on the right side and 2+ on the left side.     Heart sounds: Normal heart sounds. No murmur. No friction rub. No gallop.   Pulmonary:     Effort: Pulmonary effort is normal. No respiratory distress.     Breath sounds: Normal breath sounds. No wheezing or rales.     Comments: Respirations equal and unlabored, patient able to speak in full sentences, lungs clear to auscultation bilaterally Chest:     Chest wall: No tenderness.  Abdominal:     General: Bowel sounds are normal. There is no distension.     Palpations: Abdomen is soft. There is no mass.     Tenderness: There is no abdominal tenderness. There is no guarding.     Comments: Abdomen soft, nondistended, nontender to palpation in all quadrants without guarding or peritoneal signs  Musculoskeletal:        General: No deformity.     Right lower leg: He exhibits no tenderness. No edema.     Left lower leg: He exhibits no tenderness. No edema.  Skin:    General: Skin is warm and dry.     Capillary Refill: Capillary refill takes less than 2 seconds.  Neurological:     Mental Status: He is alert and oriented to person, place, and time.     Coordination: Coordination normal.     Comments: Speech is clear, able to follow commands CN III-XII intact Normal strength in  upper and lower extremities bilaterally including dorsiflexion and plantar flexion, strong and equal grip strength Sensation normal to light and sharp touch Moves extremities without ataxia, coordination intact  Psychiatric:        Mood and Affect: Mood normal.        Behavior: Behavior normal.      ED Treatments / Results  Labs (all labs ordered are listed, but only abnormal results are displayed) Labs Reviewed  BASIC METABOLIC PANEL  CBC  I-STAT TROPONIN, ED    EKG None  Radiology No results found.  Procedures Procedures (including critical care time)  Medications Ordered in ED Medications  sodium chloride flush (NS) 0.9 % injection 3 mL (has no administration in time range)     Initial Impression / Assessment and Plan / ED Course  I have reviewed the triage vital signs and the nursing notes.  Pertinent labs & imaging results that were available during my care of the patient were reviewed by me and considered in my medical decision making (see chart for details).  Patient presents to the emergency department for evaluation of 1 episode of chest pain which occurred after lunch when he does stand up and then resolved with rest, no recurrence the patient is currently chest pain-free.  No associated shortness of breath, lightheadedness, syncope, pain does not radiate to the back and there is no associated abdominal pain, nausea or vomiting.  Patient has no EKG changes today, chronic bradycardia is unchanged, and patient has 2- troponins making ACS unlikely.  Aside from patient's age she has no risk factors for PE and is not short of breath, tachycardic or hypoxic, I feel this is  very unlikely.  Story not consistent with dissection.  Chest x-ray showed no evidence of pneumonia.  Patient has recently been diagnosed with GERD, pain started after eating this may be related.  Patient does have a heart pathway score of 4 but I discussed with patient and family and after reassuring  work-up he would prefer to be discharged home with close follow-up with his cardiologist and I feel this is reasonable.  Case discussed with Dr. Sedonia Small who saw and evaluated patient as well and is in agreement with this plan.  Patient discharged home in good condition.  Discussed appropriate return precautions patient expresses understanding and agreement with plan.  Final Clinical Impressions(s) / ED Diagnoses   Final diagnoses:  Left-sided chest pain    ED Discharge Orders    None       Janet Berlin 08/31/18 2219    Maudie Flakes, MD 09/01/18 [phone]

## 2018-08-28 NOTE — Discharge Instructions (Signed)
Your work-up today is reassuring but I would like for you to follow-up with Dr. Marlou Porch with cardiology as he may wish to set up an outpatient stress test.  Return to the emergency department if your chest pain returns, especially if it does not get better quickly as it did today, you have lightheadedness or feels that you are going to pass out you feel very short of breath or have pain with breathing or any other new or concerning symptoms.

## 2018-08-28 NOTE — ED Triage Notes (Signed)
Pt c/o chest pain / pressure when standing this afternoon. Pain improved when patient was sitting. Pt was pain free in triage. Pt states he normally is bradycardic. Denies sob.

## 2018-09-09 ENCOUNTER — Telehealth: Payer: Self-pay | Admitting: Family Medicine

## 2018-09-09 NOTE — Telephone Encounter (Signed)
Left VM in regards to appt scheduled on 07/10/2019. Dr. Brigitte Pulse is leaving our practice and we need to cancel/reschedule his appt.

## 2018-09-21 DIAGNOSIS — R142 Eructation: Secondary | ICD-10-CM | POA: Diagnosis not present

## 2018-09-21 DIAGNOSIS — R140 Abdominal distension (gaseous): Secondary | ICD-10-CM | POA: Diagnosis not present

## 2018-09-28 ENCOUNTER — Encounter: Payer: Self-pay | Admitting: Family Medicine

## 2018-10-27 ENCOUNTER — Encounter: Payer: Self-pay | Admitting: Cardiology

## 2018-10-27 ENCOUNTER — Other Ambulatory Visit: Payer: Self-pay

## 2018-10-27 ENCOUNTER — Telehealth (INDEPENDENT_AMBULATORY_CARE_PROVIDER_SITE_OTHER): Payer: Medicare Other | Admitting: Cardiology

## 2018-10-27 VITALS — BP 148/89 | HR 54 | Ht 69.0 in | Wt 165.0 lb

## 2018-10-27 DIAGNOSIS — R079 Chest pain, unspecified: Secondary | ICD-10-CM

## 2018-10-27 DIAGNOSIS — Z8249 Family history of ischemic heart disease and other diseases of the circulatory system: Secondary | ICD-10-CM | POA: Diagnosis not present

## 2018-10-27 NOTE — Progress Notes (Signed)
Virtual Visit via Telephone Note   This visit type was conducted due to national recommendations for restrictions regarding the COVID-19 Pandemic (e.g. social distancing) in an effort to limit this patient's exposure and mitigate transmission in our community.  Due to his co-morbid illnesses, this patient is at least at moderate risk for complications without adequate follow up.  This format is felt to be most appropriate for this patient at this time.  The patient did not have access to video technology/had technical difficulties with video requiring transitioning to audio format only (telephone).  All issues noted in this document were discussed and addressed.  No physical exam could be performed with this format.  Please refer to the patient's chart for his  consent to telehealth for [facility].   Evaluation Performed:  Follow-up visit  Date:  10/27/2018   ID:  Anthony Juarez, DOB 20-Dec-1945, MRN [id number]  Patient Location: Home Provider Location: Home  PCP:  Shawnee Knapp, MD  Cardiologist:  Candee Furbish, MD  Electrophysiologist:  None   Chief Complaint: Evaluation of chest pain at the request of Dr. Brigitte Pulse  History of Present Illness:    Anthony Juarez is a 73 y.o. male with GERD, hyperlipidemia, sleep apnea, asthma, BPH who presented to the emergency room on 08/28/2018 with chest pain.  He was describing left-sided chest discomfort and pressure when he was standing up in the afternoon seemed to relieve with rest and had no recurrence.  No other radiation to his neck or jaw.  Did not seem to be worse with deep breathing or change in position.  No edema.  He did report that he had previous episodes of this chest discomfort but this 1 seemed quite severe prompting his emergency department visit.  He had been diagnosed with GERD.  The pain that he experienced did occur about 30 minutes after lunchtime.  Former smoker with 10-year pack history, no early coronary artery disease.  His  father did have CABG in his 62s.  I had seen him last in 2016 for evaluation of shortness of breath that he had off and on in the summertime.  There was concern previously that pectus excavatum may have been causing some difficulty with his breathing.  At that time an echocardiogram was performed and was normal.  Stress test was also normal at that time.  He previously had an abnormal exercise treadmill test with ST changes, therefore nuclear was performed and was low risk with no ischemic changes.  Intermittent CP prior to hike. Dehydrated perhaps.   No recent CP. 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 May be recurrent. Has MDI in case, last night use. Small episode last night, trouble breathing when laying on side, thirsty. Back to bed. Shaking and decreased breathing. Covered up. Albuterol. Temp 97.3.   Wife and he hikes regularity 4 times a week. 6 miles at a time. Used to work out at [employer].   The patient does not have symptoms concerning for COVID-19 infection (fever, chills, cough, or new shortness of breath).    Past Medical History:  Diagnosis Date  . Allergy   . Arthritis   . Asthma    childhood  . AVM (arteriovenous malformation) brain 06/15/2013  . Balanitis   . Benign prostatic hypertrophy   . Central sleep apnea   . Cholecystitis with cholelithiasis   . Chronic sinus bradycardia 07/09/2018  . Convulsive disorder ([place])   . Esophageal reflux 03/28/2015  . History of nuclear stress test  Myoview 11/16: EF 60%, normal perfusion, Low Risk  . Hyperlipidemia   . Prostatitis   . Seizures ([place])    Past Surgical History:  Procedure Laterality Date  . ELBOW SURGERY  11/11/2000   repair  . FRACTURE SURGERY     LEFT ELBOW  . TONSILLECTOMY       Current Meds  Medication Sig  . albuterol (PROVENTIL HFA;VENTOLIN HFA) 108 (90 Base) MCG/ACT inhaler Inhale 2 puffs into the lungs every 4 (four) hours as needed for wheezing or shortness of breath (cough, shortness of breath or  wheezing.).  Marland Kitchen aspirin EC 81 MG tablet Take 81 mg by mouth 2 (two) times a week. Tues and Sat  . atorvastatin (LIPITOR) 10 MG tablet Take 1 tablet (10 mg total) by mouth daily.  Marland Kitchen doxazosin (CARDURA) 2 MG tablet Take 1 tablet (2 mg total) by mouth daily.  . finasteride (PROSCAR) 5 MG tablet Take 1 tablet (5 mg total) by mouth daily.  . fluticasone (FLONASE) 50 MCG/ACT nasal spray USE 2 SPRAYS IN EACH NOSTRIL AT BEDTIME.  . LamoTRIgine 200 MG TB24 24 hour tablet Take 1 tablet (200 mg total) by mouth at bedtime.  . montelukast (SINGULAIR) 10 MG tablet Take 1 tablet (10 mg total) by mouth at bedtime.  . Multiple Vitamins-Minerals (MULTIVITAMIN WITH MINERALS) tablet Take 1 tablet by mouth daily.     Allergies:   Penicillins   Social History   Tobacco Use  . Smoking status: Former Smoker    Packs/day: 1.00    Years: 10.00    Pack years: 10.00    Last attempt to quit: 07/13/1977    Years since quitting: 41.3  . Smokeless tobacco: Never Used  Substance Use Topics  . Alcohol use: Yes    Alcohol/week: 2.0 standard drinks    Types: 2 Cans of beer per week    Comment: 2 beers a day  . Drug use: No     Family Hx: The patient's family history includes Cancer in his father; Dementia in his mother; Heart disease in his father and maternal grandfather.  ROS:   Please see the history of present illness.    Denies any fevers chills nausea vomiting bleeding All other systems reviewed and are negative.   Prior CV studies:   The following studies were reviewed today:  Nuclear stress test in 2016 was low risk no ischemia Echocardiogram in 2016 demonstrated EF 60%, normal  Labs/Other Tests and Data Reviewed:    EKG:  An ECG dated 08/28/2018 was personally reviewed today and demonstrated:  Sinus bradycardia rate 53 with poor R wave progression otherwise unremarkable  Recent Labs: 07/08/2018: ALT 10; TSH 3.650 08/28/2018: BUN 18; Creatinine, Ser 0.91; Hemoglobin 13.9; Platelets 282; Potassium  4.0; Sodium 136   Recent Lipid Panel Lab Results  Component Value Date/Time   CHOL 245 (H) 07/08/2018 10:27 AM   TRIG 52 07/08/2018 10:27 AM   HDL 95 07/08/2018 10:27 AM   CHOLHDL 2.6 07/08/2018 10:27 AM   CHOLHDL 2.7 05/14/2016 09:00 AM   LDLCALC 140 (H) 07/08/2018 10:27 AM    Wt Readings from Last 3 Encounters:  10/27/18 165 lb (74.8 kg)  07/08/18 175 lb 9.6 oz (79.7 kg)  06/21/18 176 lb (79.8 kg)     Objective:    Vital Signs:  BP (!) 148/89   Pulse (!) 54   Ht 5\' 9"  (1.753 m)   Wt 165 lb (74.8 kg)   BMI 24.37 kg/m    Well nourished,  well developed male in no acute distress. Able to complete full sentences without difficulty, alert and oriented x3.  ASSESSMENT & PLAN:    Family history of coronary artery disease -His father had bypass surgery at age 81, his current age.  He is currently taking atorvastatin 10 mg once a day.  Previous LDL was 140, HDL 95. - I think would be reasonable to proceed with coronary calcium score in 2 months when hopefully the office scanner is up and running.  He is aware of the out-of-pocket cost.  The rationale behind this is that if there is evidence of at least moderate coronary artery calcification present, we will intensify his statin therapy.  Also, if there was severe enough calcification, this would warrant further evaluation with a nuclear stress test.  Periodic shortness of breath - Was diagnosed with asthma as a child.  He does have an albuterol inhaler.  Has to use this periodically.  Ultimately, he is still able to go hiking with his wife for several miles without any significant difficulty.  This is reassuring.  Bradycardia -Asymptomatic.  No changes.  Atypical chest pain - Troponin normal, EKG unremarkable from February.  No recurrence.  Possibly GERD related.  Prior nuclear stress test in 2016 was unremarkable.  Echocardiogram back then was also unremarkable.  COVID-19 Education: The signs and symptoms of COVID-19 were  discussed with the patient and how to seek care for testing (follow up with PCP or arrange E-visit).  The importance of social distancing was discussed today.  Time:   Today, I have spent 35 minutes with the patient with telehealth technology discussing the above problems.     Medication Adjustments/Labs and Tests Ordered: Current medicines are reviewed at length with the patient today.  Concerns regarding medicines are outlined above.   Tests Ordered: Orders Placed This Encounter  Procedures  . CT CARDIAC SCORING    Medication Changes: No orders of the defined types were placed in this encounter.   Disposition:  Follow up With result of coronary calcium score  Signed, Candee Furbish, MD  10/27/2018 4:25 PM    [facility]

## 2018-10-27 NOTE — Patient Instructions (Signed)
Medication Instructions:  The current medical regimen is effective;  continue present plan and medications.  If you need a refill on your cardiac medications before your next appointment, please call your pharmacy.   Testing/Procedures: Your physician has requested that you have Coronary Calcium score. This test is complete by Cardiac computed tomography (CT) which is a painless test that uses an x-ray machine to take clear, detailed pictures of your heart. It is doe in our office at [address], [place], [place], [place].  The cost is $150 due at the time of the testing.   You will be contacted to be scheduled for this testing. (Due in June 2020).  There are no instructions prior to the testing and you may eat/drink as normal this day.  Follow-Up: Follow up will be based on the results of the above testing.  Thank you for choosing [facility]!!

## 2018-10-31 ENCOUNTER — Other Ambulatory Visit: Payer: Self-pay

## 2018-10-31 ENCOUNTER — Ambulatory Visit
Admission: EM | Admit: 2018-10-31 | Discharge: 2018-10-31 | Disposition: A | Discharge status: Home / Self Care | Payer: Medicare Other | Attending: Family Medicine | Admitting: Family Medicine

## 2018-10-31 ENCOUNTER — Encounter: Payer: Self-pay | Admitting: Emergency Medicine

## 2018-10-31 DIAGNOSIS — J683 Other acute and subacute respiratory conditions due to chemicals, gases, fumes and vapors: Secondary | ICD-10-CM | POA: Diagnosis not present

## 2018-10-31 MED ORDER — ALBUTEROL SULFATE HFA 108 (90 BASE) MCG/ACT IN AERS: 2.0000 | INHALATION_SPRAY | RESPIRATORY_TRACT | 1 refills | Status: DC | PRN

## 2018-10-31 MED ORDER — SPACER/AERO-HOLDING CHAMBERS DEVI: 1.0000 | 2 refills | Status: DC | PRN

## 2018-10-31 NOTE — Discharge Instructions (Addendum)
Expect the inhaler will resolve these symptoms, which may be intermittent during the spring pollen season

## 2018-10-31 NOTE — ED Triage Notes (Signed)
Pt presents to [facility] for assessment of shortness of breath Pt states Thursday he went to lay down and felt short of breath and like he was breathing too heavy.  Pt states he drank several glasses of water, had some tremors for a little bit and fell asleep.  Pt c/o worsening SOB with sitting still, states being up walking is okay.  Pt states there is a desire to cough, but no full coughs occurring.  Denies n/v/d.  Denies light-headedness or dizziness.  Pt states he was able to complete a 6 mile hike yesterday with his wife, but today he woke up worse.

## 2018-10-31 NOTE — ED Provider Notes (Signed)
[facility]    CSN: [id number] Arrival date & time: 10/31/18  1240     History   Chief Complaint Chief Complaint  Patient presents with  . Shortness of Breath    HPI Anthony Juarez is a 73 y.o. male.   Pt presents to [facility] for assessment of shortness of breath Pt states early Thursday morning, when he went to lay down and felt short of breath and like he was breathing too heavy.  Pt states he drank several glasses of water, had some tremors for a little bit and fell asleep.  No symptoms Friday.    Pt c/o mild SOB with sitting still this morning, states being up walking is okay.  Pt states there is a desire to cough, but no full coughs occurring.  Denies n/v/d.  Denies light-headedness or dizziness.  Pt states he was able to complete a 6 mile hike yesterday with his wife, but today he woke up worse.   Patient denies loss of smell, myalgia, fever, toe pain.  He has a h/o childhood asthma.  Retired Psychologist, forensic.      Past Medical History:  Diagnosis Date  . Allergy   . Arthritis   . Asthma    childhood  . AVM (arteriovenous malformation) brain 06/15/2013  . Balanitis   . Benign prostatic hypertrophy   . Central sleep apnea   . Cholecystitis with cholelithiasis   . Chronic sinus bradycardia 07/09/2018  . Convulsive disorder ([place])   . Esophageal reflux 03/28/2015  . History of nuclear stress test    Myoview 11/16: EF 60%, normal perfusion, Low Risk  . Hyperlipidemia   . Prostatitis   . Seizures [facility])     Patient Active Problem List   Diagnosis Date Noted  . Chronic sinus bradycardia 07/09/2018  . Abnormal bowel movement 07/09/2018  . Hyperinflation of lungs 07/09/2018  . Non-seasonal allergic rhinitis 07/09/2018  . Abnormal finding on MRI of brain 12/04/2016  . Benign prostatic hyperplasia with incomplete bladder emptying 11/11/2016  . Hyperlipidemia 05/13/2016  . Esophageal reflux 03/28/2015  . Shortness of breath 03/28/2015  . Skin  lesion of face 03/28/2015  . AVM (arteriovenous malformation) brain 06/15/2013  . Seizure disorder ([place]) 01/19/2013  . Central sleep apnea 06/10/2012    Past Surgical History:  Procedure Laterality Date  . ELBOW SURGERY  11/11/2000   repair  . FRACTURE SURGERY     LEFT ELBOW  . TONSILLECTOMY         Home Medications    Prior to Admission medications   Medication Sig Start Date End Date Taking? Authorizing Provider  albuterol (PROVENTIL HFA;VENTOLIN HFA) 108 (90 Base) MCG/ACT inhaler Inhale 2 puffs into the lungs every 4 (four) hours as needed for wheezing or shortness of breath (cough, shortness of breath or wheezing.). 06/01/17  Yes Shawnee Knapp, MD  aspirin EC 81 MG tablet Take 81 mg by mouth 2 (two) times a week. Tues and Sat   Yes [provider]  atorvastatin (LIPITOR) 10 MG tablet Take 1 tablet (10 mg total) by mouth daily. 07/09/18  Yes Shawnee Knapp, MD  doxazosin (CARDURA) 2 MG tablet Take 1 tablet (2 mg total) by mouth daily. 07/08/18  Yes Shawnee Knapp, MD  esomeprazole (NEXIUM) 20 MG capsule Take 20 mg by mouth daily at 12 noon.   Yes [provider]  finasteride (PROSCAR) 5 MG tablet Take 1 tablet (5 mg total) by mouth daily. 07/08/18  Yes Delman Cheadle  N, MD  fluticasone (FLONASE) 50 MCG/ACT nasal spray USE 2 SPRAYS IN EACH NOSTRIL AT BEDTIME. 07/08/18  Yes Shawnee Knapp, MD  LamoTRIgine 200 MG TB24 24 hour tablet Take 1 tablet (200 mg total) by mouth at bedtime. 06/21/18  Yes Marcial Pacas, MD  montelukast (SINGULAIR) 10 MG tablet Take 1 tablet (10 mg total) by mouth at bedtime. 07/08/18  Yes Shawnee Knapp, MD  Multiple Vitamins-Minerals (MULTIVITAMIN WITH MINERALS) tablet Take 1 tablet by mouth daily.   Yes [provider]  albuterol (VENTOLIN HFA) 108 (90 Base) MCG/ACT inhaler Inhale 2 puffs into the lungs every 4 (four) hours as needed for wheezing or shortness of breath (cough, shortness of breath or wheezing.). 10/31/18   Robyn Haber, MD   Spacer/Aero-Holding Chambers DEVI 1 Device by Does not apply route every 4 (four) hours as needed. 10/31/18   Robyn Haber, MD    Family History Family History  Problem Relation Age of Onset  . Heart disease Father        cabg 84's  . Cancer Father        Prostate  . Heart disease Maternal Grandfather        Late 75's  . Dementia Mother     Social History Social History   Tobacco Use  . Smoking status: Former Smoker    Packs/day: 1.00    Years: 10.00    Pack years: 10.00    Last attempt to quit: 07/13/1977    Years since quitting: 41.3  . Smokeless tobacco: Never Used  Substance Use Topics  . Alcohol use: Yes    Alcohol/week: 2.0 standard drinks    Types: 2 Cans of beer per week    Comment: 2 beers a day  . Drug use: No     Allergies   Penicillins   Review of Systems Review of Systems  Respiratory: Positive for chest tightness and shortness of breath.   All other systems reviewed and are negative.    Physical Exam Triage Vital Signs ED Triage Vitals  Enc Vitals Group     BP 10/31/18 1248 (!) 173/90     Pulse Rate 10/31/18 1248 60     Resp 10/31/18 1248 18     Temp 10/31/18 1248 98.1 F (36.7 C)     Temp Source 10/31/18 1248 Oral     SpO2 10/31/18 1248 99 %     Weight --      Height --      Head Circumference --      Peak Flow --      Pain Score 10/31/18 1249 0     Pain Loc --      Pain Edu? --      Excl. in [place]? --    No data found.  Updated Vital Signs BP (!) 173/90 (BP Location: Left Arm)   Pulse 60   Temp 98.1 F (36.7 C) (Oral)   Resp 18   SpO2 99%    Physical Exam Vitals signs and nursing note reviewed.  Constitutional:      Appearance: He is well-developed and normal weight.  HENT:     Head: Normocephalic.     Mouth/Throat:     Mouth: Mucous membranes are moist.     Pharynx: Oropharynx is clear.  Neck:     Musculoskeletal: Normal range of motion and neck supple.  Cardiovascular:     Rate and Rhythm: Normal rate and  regular rhythm.  Pulmonary:  Effort: Pulmonary effort is normal.     Breath sounds: Normal breath sounds.  Musculoskeletal: Normal range of motion.  Skin:    General: Skin is warm and dry.  Neurological:     General: No focal deficit present.     Mental Status: He is alert.     Comments: Intermittent hand tremor bilaterally  Psychiatric:        Mood and Affect: Mood normal.        Behavior: Behavior normal.      UC Treatments / Results  Labs (all labs ordered are listed, but only abnormal results are displayed) Labs Reviewed - No data to display  EKG None  Radiology No results found.  Procedures Procedures (including critical care time)  Medications Ordered in UC Medications - No data to display  Initial Impression / Assessment and Plan / UC Course  I have reviewed the triage vital signs and the nursing notes.  Pertinent labs & imaging results that were available during my care of the patient were reviewed by me and considered in my medical decision making (see chart for details).    Final Clinical Impressions(s) / UC Diagnoses   Final diagnoses:  Reactive airways dysfunction syndrome [facility])     Discharge Instructions     Expect the inhaler will resolve these symptoms, which may be intermittent during the spring pollen season    ED Prescriptions    Medication Sig Dispense Auth. Provider   albuterol (VENTOLIN HFA) 108 (90 Base) MCG/ACT inhaler Inhale 2 puffs into the lungs every 4 (four) hours as needed for wheezing or shortness of breath (cough, shortness of breath or wheezing.). 1 Inhaler Robyn Haber, MD   Spacer/Aero-Holding Chambers DEVI 1 Device by Does not apply route every 4 (four) hours as needed. 1 each Robyn Haber, MD     Controlled Substance Prescriptions [place] Controlled Substance Registry consulted? Not Applicable   Robyn Haber, MD 10/31/18 1311

## 2018-10-31 NOTE — ED Notes (Signed)
Patient able to ambulate independently  

## 2018-11-08 ENCOUNTER — Encounter: Payer: Self-pay | Admitting: Family Medicine

## 2018-11-08 ENCOUNTER — Other Ambulatory Visit: Payer: Self-pay

## 2018-11-08 ENCOUNTER — Ambulatory Visit (INDEPENDENT_AMBULATORY_CARE_PROVIDER_SITE_OTHER): Payer: Medicare Other | Admitting: Family Medicine

## 2018-11-08 VITALS — BP 138/70 | HR 88 | Temp 98.6°F | Resp 16 | Ht 69.0 in | Wt 166.2 lb

## 2018-11-08 DIAGNOSIS — D171 Benign lipomatous neoplasm of skin and subcutaneous tissue of trunk: Secondary | ICD-10-CM

## 2018-11-08 DIAGNOSIS — R634 Abnormal weight loss: Secondary | ICD-10-CM | POA: Diagnosis not present

## 2018-11-08 DIAGNOSIS — R030 Elevated blood-pressure reading, without diagnosis of hypertension: Secondary | ICD-10-CM | POA: Diagnosis not present

## 2018-11-08 NOTE — Patient Instructions (Addendum)
If you have lab work done today you will be contacted with your lab results within the next 2 weeks.  If you have not heard from [place] then please contact us. The fastest way to get your results is to register for My Chart.   IF you received an x-ray today, you will receive an invoice from [facility] Radiology. Please contact [facility] Radiology at [phone] with questions or concerns regarding your invoice.   IF you received labwork today, you will receive an invoice from [facility]. Please contact [facility] at [phone] with questions or concerns regarding your invoice.   Our billing staff will not be able to assist you with questions regarding bills from these companies.  You will be contacted with the lab results as soon as they are available. The fastest way to get your results is to activate your My Chart account. Instructions are located on the last page of this paperwork. If you have not heard from [place] regarding the results in 2 weeks, please contact this office.     Lipoma  A lipoma is a noncancerous (benign) tumor that is made up of fat cells. This is a very common type of soft-tissue growth. Lipomas are usually found under the skin (subcutaneous). They may occur in any tissue of the body that contains fat. Common areas for lipomas to appear include the back, shoulders, buttocks, and thighs.  Lipomas grow slowly, and they are usually painless. Most lipomas do not cause problems and do not require treatment. What are the causes? The cause of this condition is not known. What increases the risk? You are more likely to develop this condition if:  You are 42-26 years old.  You have a family history of lipomas. What are the signs or symptoms? A lipoma usually appears as a small, round bump under the skin. In most cases, the lump will:  Feel soft or rubbery.  Not cause pain or other symptoms. However, if a lipoma is located in an area where it pushes on nerves, it can  become painful or cause other symptoms. How is this diagnosed? A lipoma can usually be diagnosed with a physical exam. You may also have tests to confirm the diagnosis and to rule out other conditions. Tests may include:  Imaging tests, such as a CT scan or MRI.  Removal of a tissue sample to be looked at under a microscope (biopsy). How is this treated? Treatment for this condition depends on the size of the lipoma and whether it is causing any symptoms.  For small lipomas that are not causing problems, no treatment is needed.  If a lipoma is bigger or it causes problems, surgery may be done to remove the lipoma. Lipomas can also be removed to improve appearance. Most often, the procedure is done after applying a medicine that numbs the area (local anesthetic). Follow these instructions at home:  Watch your lipoma for any changes.  Keep all follow-up visits as told by your health care provider. This is important. Contact a health care provider if:  Your lipoma becomes larger or hard.  Your lipoma becomes painful, red, or increasingly swollen. These could be signs of infection or a more serious condition. Get help right away if:  You develop tingling or numbness in an area near the lipoma. This could indicate that the lipoma is causing nerve damage. Summary  A lipoma is a noncancerous tumor that is made up of fat cells.  Most lipomas do not cause problems  and do not require treatment.  If a lipoma is bigger or it causes problems, surgery may be done to remove the lipoma. This information is not intended to replace advice given to you by your health care provider. Make sure you discuss any questions you have with your health care provider. Document Released: 06/19/2002 Document Revised: 06/15/2017 Document Reviewed: 06/15/2017 Elsevier Interactive Patient Education  2019 [employer]  Lipoma  A lipoma is a noncancerous (benign) tumor that is made up of fat cells. This is a  very common type of soft-tissue growth. Lipomas are usually found under the skin (subcutaneous). They may occur in any tissue of the body that contains fat. Common areas for lipomas to appear include the back, shoulders, buttocks, and thighs.  Lipomas grow slowly, and they are usually painless. Most lipomas do not cause problems and do not require treatment. What are the causes? The cause of this condition is not known. What increases the risk? You are more likely to develop this condition if:  You are 49-46 years old.  You have a family history of lipomas. What are the signs or symptoms? A lipoma usually appears as a small, round bump under the skin. In most cases, the lump will:  Feel soft or rubbery.  Not cause pain or other symptoms. However, if a lipoma is located in an area where it pushes on nerves, it can become painful or cause other symptoms. How is this diagnosed? A lipoma can usually be diagnosed with a physical exam. You may also have tests to confirm the diagnosis and to rule out other conditions. Tests may include:  Imaging tests, such as a CT scan or MRI.  Removal of a tissue sample to be looked at under a microscope (biopsy). How is this treated? Treatment for this condition depends on the size of the lipoma and whether it is causing any symptoms.  For small lipomas that are not causing problems, no treatment is needed.  If a lipoma is bigger or it causes problems, surgery may be done to remove the lipoma. Lipomas can also be removed to improve appearance. Most often, the procedure is done after applying a medicine that numbs the area (local anesthetic). Follow these instructions at home:  Watch your lipoma for any changes.  Keep all follow-up visits as told by your health care provider. This is important. Contact a health care provider if:  Your lipoma becomes larger or hard.  Your lipoma becomes painful, red, or increasingly swollen. These could be signs of  infection or a more serious condition. Get help right away if:  You develop tingling or numbness in an area near the lipoma. This could indicate that the lipoma is causing nerve damage. Summary  A lipoma is a noncancerous tumor that is made up of fat cells.  Most lipomas do not cause problems and do not require treatment.  If a lipoma is bigger or it causes problems, surgery may be done to remove the lipoma. This information is not intended to replace advice given to you by your health care provider. Make sure you discuss any questions you have with your health care provider. Document Released: 06/19/2002 Document Revised: 06/15/2017 Document Reviewed: 06/15/2017 Elsevier Interactive Patient Education  2019 [employer].

## 2018-11-08 NOTE — Progress Notes (Signed)
Established Patient Office Visit  Subjective:  Patient ID: Anthony Juarez, male    DOB: 1946/02/13  Age: 73 y.o. MRN: [id number]  CC:  Chief Complaint  Patient presents with  . nodule on right side of ribs x yrs, soreness and noticable a  . unexplained weight loss    per pt physical in 06/2018 and he weighed 175 lbs    HPI Anthony Juarez presents for   Pt reports that he has a nodule on the right over the bone of the rib for a couple of years He states that he notices that the lump has gotten bigger He states that he is prone to sebaceous cysts and is not sure if that is related to this    He states that he is trying to eat enough protein He stopped exercising due to the pandemic and felt like he started losing muscle mass quickly He takes nexium and is wondering if it is the cause He is eating He states that he maintains a good activity level. This morning he and his wife did a 5.5 mile hike  He denies dizziness, fatigue, shortness of breath This is the 3rd episode of untintentional weight loss.  The first episode was 16 years ago, the second episode was 2013.  Wt Readings from Last 3 Encounters:  11/08/18 166 lb 3.2 oz (75.4 kg)  10/27/18 165 lb (74.8 kg)  07/08/18 175 lb 9.6 oz (79.7 kg)     CLINICAL DATA:  Chest pain/pressure.  EXAM: CHEST - 2 VIEW  COMPARISON:  Radiographs 09/02/2015.  FINDINGS: The heart size and mediastinal contours are normal. The lungs are clear. There is no pleural effusion or pneumothorax. No acute osseous findings are identified. Nodular density projecting over the posterior aspect of the right 5th rib on the frontal examination is likely an EKG snap as confirmed on lateral view.  IMPRESSION: No active cardiopulmonary process.   Electronically Signed   By: Richardean Sale M.D.   On: 08/28/2018 14:20  Past Medical History:  Diagnosis Date  . Allergy   . Arthritis   . Asthma    childhood  . AVM (arteriovenous  malformation) brain 06/15/2013  . Balanitis   . Benign prostatic hypertrophy   . Central sleep apnea   . Cholecystitis with cholelithiasis   . Chronic sinus bradycardia 07/09/2018  . Convulsive disorder ([place])   . Esophageal reflux 03/28/2015  . History of nuclear stress test    Myoview 11/16: EF 60%, normal perfusion, Low Risk  . Hyperlipidemia   . Prostatitis   . Seizures ([place])     Past Surgical History:  Procedure Laterality Date  . ELBOW SURGERY  11/11/2000   repair  . FRACTURE SURGERY     LEFT ELBOW  . TONSILLECTOMY      Family History  Problem Relation Age of Onset  . Heart disease Father        cabg 68's  . Cancer Father        Prostate  . Heart disease Maternal Grandfather        Late 73's  . Dementia Mother     Social History   Socioeconomic History  . Marital status: Married    Spouse name: [name]  . Number of children: Not on file  . Years of education: Not on file  . Highest education level: Bachelor's degree (e.g., BA, AB, BS)  Occupational History  . Occupation: retired    Fish farm manager: RETIRED  Social Needs  . Emergency planning/management officer  strain: Not on file  . Food insecurity:    Worry: Not on file    Inability: Not on file  . Transportation needs:    Medical: Not on file    Non-medical: Not on file  Tobacco Use  . Smoking status: Former Smoker    Packs/day: 1.00    Years: 10.00    Pack years: 10.00    Last attempt to quit: 07/13/1977    Years since quitting: 41.3  . Smokeless tobacco: Never Used  Substance and Sexual Activity  . Alcohol use: Yes    Alcohol/week: 2.0 standard drinks    Types: 2 Cans of beer per week    Comment: 2 beers a day  . Drug use: No  . Sexual activity: Not Currently    Birth control/protection: Post-menopausal, Abstinence  Lifestyle  . Physical activity:    Days per week: 4 days    Minutes per session: 30 min  . Stress: Not on file  Relationships  . Social connections:    Talks on phone: Not on file    Gets together:  Not on file    Attends religious service: Not on file    Active member of club or organization: Not on file    Attends meetings of clubs or organizations: Not on file    Relationship status: Not on file  . Intimate partner violence:    Fear of current or ex partner: Not on file    Emotionally abused: Not on file    Physically abused: Not on file    Forced sexual activity: Not on file  Other Topics Concern  . Not on file  Social History Narrative   Patient worked in [employer].    Outpatient Medications Prior to Visit  Medication Sig Dispense Refill  . albuterol (PROVENTIL HFA;VENTOLIN HFA) 108 (90 Base) MCG/ACT inhaler Inhale 2 puffs into the lungs every 4 (four) hours as needed for wheezing or shortness of breath (cough, shortness of breath or wheezing.). 1 Inhaler 2  . aspirin EC 81 MG tablet Take 81 mg by mouth 2 (two) times a week. Tues and Sat    . atorvastatin (LIPITOR) 10 MG tablet Take 1 tablet (10 mg total) by mouth daily. 90 tablet 3  . doxazosin (CARDURA) 2 MG tablet Take 1 tablet (2 mg total) by mouth daily. 90 tablet 3  . esomeprazole (NEXIUM) 20 MG capsule Take 20 mg by mouth daily at 12 noon.    . finasteride (PROSCAR) 5 MG tablet Take 1 tablet (5 mg total) by mouth daily. 90 tablet 3  . fluticasone (FLONASE) 50 MCG/ACT nasal spray USE 2 SPRAYS IN EACH NOSTRIL AT BEDTIME. 16 g 5  . LamoTRIgine 200 MG TB24 24 hour tablet Take 1 tablet (200 mg total) by mouth at bedtime. 30 tablet 11  . montelukast (SINGULAIR) 10 MG tablet Take 1 tablet (10 mg total) by mouth at bedtime. 90 tablet 3  . Multiple Vitamins-Minerals (MULTIVITAMIN WITH MINERALS) tablet Take 1 tablet by mouth daily.    Marland Kitchen Spacer/Aero-Holding Chambers DEVI 1 Device by Does not apply route every 4 (four) hours as needed. 1 each 2  . albuterol (VENTOLIN HFA) 108 (90 Base) MCG/ACT inhaler Inhale 2 puffs into the lungs every 4 (four) hours as needed for wheezing or shortness of breath (cough, shortness of breath  or wheezing.). (Patient not taking: Reported on 11/08/2018) 1 Inhaler 1   No facility-administered medications prior to visit.     Allergies  Allergen Reactions  .  Penicillins Rash    Did it involve swelling of the face/tongue/throat, SOB, or low BP? No Did it involve sudden or severe rash/hives, skin peeling, or any reaction on the inside of your mouth or nose? Yes Did you need to seek medical attention at a hospital or doctor's office? No When did it last happen?Decades ago. If all above answers are "NO", may proceed with cephalosporin use.     ROS Review of Systems Review of Systems  Constitutional: Negative for activity change, appetite change, chills and fever.  HENT: Negative for congestion, nosebleeds, trouble swallowing and voice change.   Respiratory: Negative for cough, shortness of breath and wheezing.   Gastrointestinal: Negative for diarrhea, nausea and vomiting.  Genitourinary: Negative for difficulty urinating, dysuria, flank pain and hematuria.  Musculoskeletal: Negative for back pain, joint swelling and neck pain.  Neurological: Negative for dizziness, speech difficulty, light-headedness and numbness.  See HPI. All other review of systems negative.     Objective:     Vitals:   11/08/18 1416 11/08/18 1450  BP: (!) 150/74 138/70  Pulse: 88   Resp: 16   Temp: 98.6 F (37 C)   TempSrc: Oral   SpO2: 97%   Weight: 166 lb 3.2 oz (75.4 kg)   Height: '5\' 9"'  (1.753 m)     Wt Readings from Last 3 Encounters:  11/08/18 166 lb 3.2 oz (75.4 kg)  10/27/18 165 lb (74.8 kg)  07/08/18 175 lb 9.6 oz (79.7 kg)    Physical Exam  Constitutional: He is oriented to person, place, and time. He appears well-developed and well-nourished.  HENT:  Head: Normocephalic and atraumatic.  Eyes: Conjunctivae and EOM are normal.  Cardiovascular: Normal rate, regular rhythm and normal heart sounds.  Pulmonary/Chest: Effort normal and breath sounds normal. No respiratory distress.  He has no wheezes. He has no rales.  Abdominal: Soft. Bowel sounds are normal. He exhibits no distension. There is no abdominal tenderness. There is no rebound and no guarding.  Neurological: He is alert and oriented to person, place, and time. He has normal reflexes.  Psychiatric: He has a normal mood and affect. His behavior is normal. Judgment and thought content normal.   In the right mid-axillary line at the 5th intercostal space is a egg shaped lesion that is soft nontender and mobile measuring 4cm diameter and is circular   There are no preventive care reminders to display for this patient.  There are no preventive care reminders to display for this patient.  Lab Results  Component Value Date   TSH 3.650 07/08/2018   Lab Results  Component Value Date   WBC 8.7 08/28/2018   HGB 13.9 08/28/2018   HCT 43.0 08/28/2018   MCV 93.1 08/28/2018   PLT 282 08/28/2018   Lab Results  Component Value Date   NA 136 08/28/2018   K 4.0 08/28/2018   CO2 25 08/28/2018   GLUCOSE 92 08/28/2018   BUN 18 08/28/2018   CREATININE 0.91 08/28/2018   BILITOT 0.6 07/08/2018   ALKPHOS 51 07/08/2018   AST 24 07/08/2018   ALT 10 07/08/2018   PROT 6.2 07/08/2018   ALBUMIN 4.2 07/08/2018   CALCIUM 8.9 08/28/2018   ANIONGAP 8 08/28/2018   Lab Results  Component Value Date   CHOL 245 (H) 07/08/2018   Lab Results  Component Value Date   HDL 95 07/08/2018   Lab Results  Component Value Date   LDLCALC 140 (H) 07/08/2018   Lab Results  Component Value  Date   TRIG 52 07/08/2018   Lab Results  Component Value Date   CHOLHDL 2.6 07/08/2018   No results found for: HGBA1C    Assessment & Plan:   Problem List Items Addressed This Visit    None    Visit Diagnoses    Lipoma of torso    -  Primary   Relevant Orders   Ambulatory referral to General Surgery   Unintentional weight loss    - will check nutritional status, colonoscopy up to date, will monitor and if weight does not  stabilize or improve will do more cancer screening   Relevant Orders   CMP14+EGFR   CBC      Elevated bp without diagnosis of hypertension-  bp improved with recheck, once patient was less anxious   No orders of the defined types were placed in this encounter.   Follow-up: No follow-ups on file.    Forrest Moron, MD

## 2018-11-09 LAB — CMP14+EGFR
ALT: 9 IU/L (ref 0–44)
AST: 22 IU/L (ref 0–40)
Albumin/Globulin Ratio: 2.3 — ABNORMAL HIGH (ref 1.2–2.2)
Albumin: 4.3 g/dL (ref 3.8–4.8)
Alkaline Phosphatase: 54 IU/L (ref 39–117)
BUN/Creatinine Ratio: 14 (ref 10–24)
BUN: 14 mg/dL (ref 8–27)
Bilirubin Total: 0.4 mg/dL (ref 0.0–1.2)
CO2: 18 mmol/L — ABNORMAL LOW (ref 20–29)
Calcium: 9.6 mg/dL (ref 8.6–10.2)
Chloride: 101 mmol/L (ref 96–106)
Creatinine, Ser: 0.99 mg/dL (ref 0.76–1.27)
GFR calc Af Amer: 89 mL/min/{1.73_m2} (ref >59)
GFR calc non Af Amer: 77 mL/min/{1.73_m2} (ref >59)
Globulin, Total: 1.9 g/dL (ref 1.5–4.5)
Glucose: 111 mg/dL — ABNORMAL HIGH (ref 65–99)
Potassium: 3.9 mmol/L (ref 3.5–5.2)
Sodium: 136 mmol/L (ref 134–144)
Total Protein: 6.2 g/dL (ref 6.0–8.5)

## 2018-11-09 LAB — CBC
Hematocrit: 41.1 % (ref 37.5–51.0)
Hemoglobin: 13.7 g/dL (ref 13.0–17.7)
MCH: 30.1 pg (ref 26.6–33.0)
MCHC: 33.3 g/dL (ref 31.5–35.7)
MCV: 90 fL (ref 79–97)
Platelets: 285 10*3/uL (ref 150–450)
RBC: 4.55 x10E6/uL (ref 4.14–5.80)
RDW: 12.7 % (ref 11.6–15.4)
WBC: 11 10*3/uL — ABNORMAL HIGH (ref 3.4–10.8)

## 2018-11-11 ENCOUNTER — Emergency Department (HOSPITAL_COMMUNITY)
Admission: EM | Admit: 2018-11-11 | Discharge: 2018-11-12 | Disposition: A | Discharge status: Home / Self Care | Payer: Medicare Other | Attending: Emergency Medicine | Admitting: Emergency Medicine

## 2018-11-11 ENCOUNTER — Encounter (HOSPITAL_COMMUNITY): Payer: Self-pay | Admitting: Emergency Medicine

## 2018-11-11 ENCOUNTER — Emergency Department (HOSPITAL_COMMUNITY): Payer: Medicare Other

## 2018-11-11 ENCOUNTER — Other Ambulatory Visit: Payer: Self-pay

## 2018-11-11 DIAGNOSIS — Z87891 Personal history of nicotine dependence: Secondary | ICD-10-CM | POA: Insufficient documentation

## 2018-11-11 DIAGNOSIS — Z79899 Other long term (current) drug therapy: Secondary | ICD-10-CM | POA: Diagnosis not present

## 2018-11-11 DIAGNOSIS — J45909 Unspecified asthma, uncomplicated: Secondary | ICD-10-CM | POA: Insufficient documentation

## 2018-11-11 DIAGNOSIS — Z7982 Long term (current) use of aspirin: Secondary | ICD-10-CM | POA: Insufficient documentation

## 2018-11-11 DIAGNOSIS — R05 Cough: Secondary | ICD-10-CM | POA: Diagnosis not present

## 2018-11-11 DIAGNOSIS — R0602 Shortness of breath: Secondary | ICD-10-CM | POA: Diagnosis not present

## 2018-11-11 NOTE — ED Notes (Signed)
Pt ambulated to the restroom with steady gait. 

## 2018-11-11 NOTE — ED Provider Notes (Signed)
  Face-to-face evaluation   History: He is here for shortness of breath which began today similar to another episode 2 weeks ago.  He is using his usual medications without relief.  Physical exam: Alert, somewhat anxious, cooperative.  Lungs with good air movement bilaterally.  Heart regular rate and rhythm without murmur.  He is lucid.  No respiratory distress.  Medical screening examination/treatment/procedure(s) were conducted as a shared visit with non-physician practitioner(s) and myself.  I personally evaluated the patient during the encounter    Daleen Bo, MD 11/12/18 1038

## 2018-11-11 NOTE — ED Triage Notes (Signed)
Pt reports feeling SOB that increase when lying down seen in the past for same 1 week ago at [facility] where he received albuteral inhaler. Inhaler tried today with minimal relief.

## 2018-11-12 DIAGNOSIS — J45909 Unspecified asthma, uncomplicated: Secondary | ICD-10-CM | POA: Diagnosis not present

## 2018-11-12 LAB — BASIC METABOLIC PANEL
Anion gap: 7 (ref 5–15)
BUN: 12 mg/dL (ref 8–23)
CO2: 24 mmol/L (ref 22–32)
Calcium: 9 mg/dL (ref 8.9–10.3)
Chloride: 104 mmol/L (ref 98–111)
Creatinine, Ser: 1.01 mg/dL (ref 0.61–1.24)
GFR calc Af Amer: >60 mL/min (ref >60)
GFR calc non Af Amer: >60 mL/min (ref >60)
Glucose, Bld: 93 mg/dL (ref 70–99)
Potassium: 3.7 mmol/L (ref 3.5–5.1)
Sodium: 135 mmol/L (ref 135–145)

## 2018-11-12 LAB — CBC
HCT: 40.6 % (ref 39.0–52.0)
Hemoglobin: 13.6 g/dL (ref 13.0–17.0)
MCH: 30.8 pg (ref 26.0–34.0)
MCHC: 33.5 g/dL (ref 30.0–36.0)
MCV: 92.1 fL (ref 80.0–100.0)
Platelets: 238 10*3/uL (ref 150–400)
RBC: 4.41 MIL/uL (ref 4.22–5.81)
RDW: 13.2 % (ref 11.5–15.5)
WBC: 9.2 10*3/uL (ref 4.0–10.5)
nRBC: 0 % (ref 0.0–0.2)

## 2018-11-12 LAB — TROPONIN I: Troponin I: <0.03 ng/mL (ref <0.03)

## 2018-11-12 MED ORDER — ALBUTEROL SULFATE HFA 108 (90 BASE) MCG/ACT IN AERS
8.0000 | INHALATION_SPRAY | Freq: Once | RESPIRATORY_TRACT | Status: AC
  Administered 2018-11-12: 8 via RESPIRATORY_TRACT
  Filled 2018-11-12: qty 6.7

## 2018-11-12 MED ORDER — AEROCHAMBER Z-STAT PLUS MISC
1.0000 | Freq: Once | Status: AC
  Administered 2018-11-12: 1

## 2018-11-12 NOTE — Discharge Instructions (Addendum)
Thank you for allowing me to care for you today in the Emergency Department.   Please call and schedule follow-up appointment with your primary care provider for recheck of your symptoms.  Your work-up in the ER today was reassuring.  Use 2 puffs of the albuterol inhaler with a spacer every 4 hours as needed for shortness of breath.  If you have a worsening episode that is not resolved by this, you can try to take 8 puffs of the albuterol inhaler with a spacer, but you should only use this when your symptoms worsen.  Albuterol can cause your heart rate to increase so please do not use the 8 puffs more than once every 4-6 hours at most.  In addition to the Flonase and montelukast, I would recommend starting Claritin or Zyrtec daily.  These medications are available over-the-counter.  Use as directed on the label.  This may help with the postnasal drip that you have been having.  Return to the emergency department if you develop severe shortness of breath with chest pain, high fever, respiratory distress, significant swelling in your legs, if you pass out, or develop other new, concerning symptoms.

## 2018-11-12 NOTE — ED Provider Notes (Signed)
[facility] DEPT Provider Note   CSN: [id number] Arrival date & time: 11/11/18  2147    History   Chief Complaint Chief Complaint  Patient presents with   Shortness of Breath    HPI Anthony Juarez is a 73 y.o. male with a history of allergies, asthma, AVM brain, BPH, chronic sinus bradycardia, convulsive disorder who presents to the emergency department with a chief complaint of shortness of breath.  The patient has been having episodic shortness of breath for the last 2 weeks.  He reports initially the symptoms only began at night when he was laying flat and would resolve after using 2 puffs of an albuterol inhaler with a spacer.  He also notes chronic postnasal drip and nonproductive cough.  He reports that with the initial episode 2 weeks ago that he did have a little bit of lightheadedness that then resolved without treatment.  He reports that he is otherwise been feeling well and even went on a several mile hike over the last few days.  He notes that he has been spending more time outdoors over the last few weeks.  He reports that today he developed shortness of breath that began after breakfast while he was at rest.  Shortness of breath has been constant throughout the day.  He reports his symptoms worsened when he was partially reclined in his chair and worse when he attempted to lay down to go to sleep.  Symptoms improve with sitting upright.  No other known aggravating or alleviating factors.  He has used his albuterol inhaler with a spacer without improvement.  Reports he was started on montelukast several weeks ago.  He also notes that he has been seen by urgent care and his PCP over the last few weeks for the same complaint.   He denies chest pain, chest tightness, fever, chills, productive cough, nausea, vomiting, diarrhea, abdominal pain, back pain, palpitations, sinus pain or pressure, headaches, syncope, leg swelling.   No family history of VTE.   He reports he had a full cardiac work-up 2 years ago, which was normal.  No recent long travel.     The history is provided by the patient. No language interpreter was used.    Past Medical History:  Diagnosis Date   Allergy    Arthritis    Asthma    childhood   AVM (arteriovenous malformation) brain 06/15/2013   Balanitis    Benign prostatic hypertrophy    Central sleep apnea    Cholecystitis with cholelithiasis    Chronic sinus bradycardia 07/09/2018   Convulsive disorder ([place])    Esophageal reflux 03/28/2015   History of nuclear stress test    Myoview 11/16: EF 60%, normal perfusion, Low Risk   Hyperlipidemia    Prostatitis    Seizures ([place])     Patient Active Problem List   Diagnosis Date Noted   Chronic sinus bradycardia 07/09/2018   Abnormal bowel movement 07/09/2018   Hyperinflation of lungs 07/09/2018   Non-seasonal allergic rhinitis 07/09/2018   Abnormal finding on MRI of brain 12/04/2016   Benign prostatic hyperplasia with incomplete bladder emptying 11/11/2016   Hyperlipidemia 05/13/2016   Esophageal reflux 03/28/2015   Shortness of breath 03/28/2015   Skin lesion of face 03/28/2015   AVM (arteriovenous malformation) brain 06/15/2013   Seizure disorder ([place]) 01/19/2013   Central sleep apnea 06/10/2012    Past Surgical History:  Procedure Laterality Date   ELBOW SURGERY  11/11/2000   repair   FRACTURE  SURGERY     LEFT ELBOW   TONSILLECTOMY          Home Medications    Prior to Admission medications   Medication Sig Start Date End Date Taking? Authorizing Provider  albuterol (PROVENTIL HFA;VENTOLIN HFA) 108 (90 Base) MCG/ACT inhaler Inhale 2 puffs into the lungs every 4 (four) hours as needed for wheezing or shortness of breath (cough, shortness of breath or wheezing.). 06/01/17  Yes Shawnee Knapp, MD  aspirin EC 81 MG tablet Take 81 mg by mouth 2 (two) times a week. Tues and Sat   Yes [provider]    atorvastatin (LIPITOR) 10 MG tablet Take 1 tablet (10 mg total) by mouth daily. 07/09/18  Yes Shawnee Knapp, MD  doxazosin (CARDURA) 2 MG tablet Take 1 tablet (2 mg total) by mouth daily. 07/08/18  Yes Shawnee Knapp, MD  esomeprazole (NEXIUM) 20 MG capsule Take 20 mg by mouth daily at 12 noon.   Yes [provider]  finasteride (PROSCAR) 5 MG tablet Take 1 tablet (5 mg total) by mouth daily. 07/08/18  Yes Shawnee Knapp, MD  fluticasone (FLONASE) 50 MCG/ACT nasal spray USE 2 SPRAYS IN EACH NOSTRIL AT BEDTIME. Patient taking differently: Place 2 sprays into both nostrils at bedtime.  07/08/18  Yes Shawnee Knapp, MD  guaiFENesin (MUCINEX) 600 MG 12 hr tablet Take 600 mg by mouth 2 (two) times daily as needed for cough or to loosen phlegm.   Yes [provider]  LamoTRIgine 200 MG TB24 24 hour tablet Take 1 tablet (200 mg total) by mouth at bedtime. 06/21/18  Yes Marcial Pacas, MD  montelukast (SINGULAIR) 10 MG tablet Take 1 tablet (10 mg total) by mouth at bedtime. 07/08/18  Yes Shawnee Knapp, MD  Multiple Vitamins-Minerals (MULTIVITAMIN WITH MINERALS) tablet Take 1 tablet by mouth daily.   Yes [provider]  pseudoephedrine (SUDAFED) 30 MG tablet Take 30 mg by mouth every 4 (four) hours as needed for congestion.   Yes [provider]  Spacer/Aero-Holding Chambers DEVI 1 Device by Does not apply route every 4 (four) hours as needed. 10/31/18   Robyn Haber, MD    Family History Family History  Problem Relation Age of Onset   Heart disease Father        cabg 10's   Cancer Father        Prostate   Heart disease Maternal Grandfather        Late 28's   Dementia Mother     Social History Social History   Tobacco Use   Smoking status: Former Smoker    Packs/day: 1.00    Years: 10.00    Pack years: 10.00    Last attempt to quit: 07/13/1977    Years since quitting: 41.3   Smokeless tobacco: Never Used  Substance Use Topics   Alcohol use: Yes     Alcohol/week: 2.0 standard drinks    Types: 2 Cans of beer per week    Comment: 2 beers a day   Drug use: No     Allergies   Penicillins   Review of Systems Review of Systems  Constitutional: Negative for appetite change.  HENT: Positive for postnasal drip. Negative for congestion, ear discharge and tinnitus.   Respiratory: Positive for cough (chronic) and shortness of breath.   Cardiovascular: Negative for chest pain, palpitations and leg swelling.  Gastrointestinal: Negative for abdominal pain, diarrhea, nausea and vomiting.  Genitourinary: Negative for dysuria.  Musculoskeletal:  Negative for back pain, myalgias, neck pain and neck stiffness.  Skin: Negative for color change and wound.  Allergic/Immunologic: Negative for immunocompromised state.  Neurological: Positive for light-headedness (resolved). Negative for dizziness, seizures and weakness.  Hematological: Does not bruise/bleed easily.  Psychiatric/Behavioral: Negative for confusion.   Physical Exam Updated Vital Signs BP 137/75    Pulse (!) 50    Temp 98.3 F (36.8 C) (Oral)    Resp 16    SpO2 99%   Physical Exam Vitals signs and nursing note reviewed.  Constitutional:      General: He is not in acute distress.    Appearance: He is well-developed. He is not ill-appearing, toxic-appearing or diaphoretic.  HENT:     Head: Normocephalic.     Right Ear: Tympanic membrane, ear canal and external ear normal.     Left Ear: Tympanic membrane, ear canal and external ear normal.  Eyes:     Conjunctiva/sclera: Conjunctivae normal.  Neck:     Musculoskeletal: Neck supple.  Cardiovascular:     Rate and Rhythm: Regular rhythm. Bradycardia present.     Heart sounds: No murmur. No friction rub. No gallop.   Pulmonary:     Effort: Pulmonary effort is normal. No respiratory distress.     Breath sounds: No stridor. Wheezing present. No rhonchi or rales.     Comments: Minimal end expiratory wheezes in the bilateral apices.   Lungs are otherwise clear to auscultation bilaterally.  No increased work of breathing.  No tachypnea. Chest:     Chest wall: No tenderness.  Abdominal:     General: There is no distension.     Palpations: Abdomen is soft. There is no mass.     Tenderness: There is no abdominal tenderness. There is no right CVA tenderness, left CVA tenderness, guarding or rebound.     Hernia: No hernia is present.  Skin:    General: Skin is warm and dry.  Neurological:     Mental Status: He is alert.  Psychiatric:        Mood and Affect: Mood is anxious.        Behavior: Behavior normal.      ED Treatments / Results  Labs (all labs ordered are listed, but only abnormal results are displayed) Labs Reviewed  CBC  BASIC METABOLIC PANEL  TROPONIN I    EKG EKG Interpretation  Date/Time:  Friday Nov 11 2018 22:09:25 EDT Ventricular Rate:  60 PR Interval:    QRS Duration: 97 QT Interval:  381 QTC Calculation: 381 R Axis:   86 Text Interpretation:  Sinus rhythm Atrial premature complexes Borderline right axis deviation Borderline low voltage, extremity leads Minimal ST elevation, anterior leads since last tracing no significant change Confirmed by Daleen Bo [id number]) on 11/11/2018 10:36:56 PM   Radiology Dg Chest 2 View  Result Date: 11/12/2018 CLINICAL DATA:  Shortness of breath EXAM: CHEST - 2 VIEW COMPARISON:  08/28/2018 FINDINGS: Heart and mediastinal contours are within normal limits. No focal opacities or effusions. No acute bony abnormality. IMPRESSION: No active cardiopulmonary disease. Electronically Signed   By: Rolm Baptise M.D.   On: 11/12/2018 00:02    Procedures Procedures (including critical care time)  Medications Ordered in ED Medications  albuterol (VENTOLIN HFA) 108 (90 Base) MCG/ACT inhaler 8 puff (8 puffs Inhalation Given 11/12/18 0144)  AeroChamber Z-Stat Plus MISC 1 each (1 each Other Given 11/12/18 0145)     Initial Impression / Assessment and Plan / ED Course  I  have reviewed the triage vital signs and the nursing notes.  Pertinent labs & imaging results that were available during my care of the patient were reviewed by me and considered in my medical decision making (see chart for details).        73 year old male with a history of allergies, asthma, AVM brain, BPH, chronic sinus bradycardia, convulsive disorder presenting with episodic shortness of breath for the last 2 weeks that has been constant since this morning.  Symptoms are worse with laying flat.  No improvement with home albuterol inhaler today.  The patient was discussed and independently evaluated by Dr. Eulis Foster, attending physician.  Will order chest x-ray, EKG, and basic labs and reassess.  Patient has been hemodynamically stable since arrival.  He has bradycardia, but based on chart review this is chronic.  SaO2 is 99%.  He has no distress or increased work of breathing.  Labs are reassuring.  EKG unchanged from previous.  Troponin is negative.  Patient was given 8 puffs of albuterol inhaler and reports resolution of shortness of breath.  I have a very low suspicion for ACS, acute exacerbation of congestive heart failure, PE, pneumonia, or pneumothorax.  I suspect patient is having reactive airway disease secondary to seasonal allergies.  I have advised the patient to start taking Zyrtec.  He has been followed he is to follow-up with primary care.  He has had no hypoxia or increased work of breathing since arrival in the ER.  He is hemodynamically stable and in no acute distress.  Return precautions to the ER given.  Safe for discharge to home with outpatient follow-up at this time.  Final Clinical Impressions(s) / ED Diagnoses   Final diagnoses:  Reactive airway disease without complication, unspecified asthma severity, unspecified whether persistent    ED Discharge Orders    None       Joanne Gavel, PA-C 11/12/18 Nathanial Millman, MD 11/12/18 1038

## 2018-12-22 DIAGNOSIS — D171 Benign lipomatous neoplasm of skin and subcutaneous tissue of trunk: Secondary | ICD-10-CM | POA: Diagnosis not present

## 2019-01-16 ENCOUNTER — Telehealth: Payer: Self-pay | Admitting: *Deleted

## 2019-01-16 NOTE — Telephone Encounter (Signed)

## 2019-01-17 ENCOUNTER — Telehealth: Payer: Self-pay

## 2019-01-17 ENCOUNTER — Other Ambulatory Visit: Payer: Self-pay

## 2019-01-17 ENCOUNTER — Ambulatory Visit (INDEPENDENT_AMBULATORY_CARE_PROVIDER_SITE_OTHER)
Admission: RE | Admit: 2019-01-17 | Discharge: 2019-01-17 | Disposition: A | Discharge status: Home / Self Care | Payer: Self-pay | Source: Ambulatory Visit | Attending: Cardiology | Admitting: Cardiology

## 2019-01-17 DIAGNOSIS — E785 Hyperlipidemia, unspecified: Secondary | ICD-10-CM

## 2019-01-17 DIAGNOSIS — R079 Chest pain, unspecified: Secondary | ICD-10-CM

## 2019-01-17 DIAGNOSIS — Z8249 Family history of ischemic heart disease and other diseases of the circulatory system: Secondary | ICD-10-CM

## 2019-01-17 MED ORDER — ROSUVASTATIN CALCIUM 20 MG PO TABS: 20.0000 mg | ORAL_TABLET | Freq: Every day | ORAL | 3 refills | Status: DC

## 2019-01-17 NOTE — Telephone Encounter (Signed)
-----   Message from Jerline Pain, MD sent at 01/17/2019  2:23 PM EDT ----- Given calcium score of 166 (52% for age), I would like to intensify his statin therapy with change to Crestor 20mg  PO QD. LDL 140 last. Let's shoot for LDL <70.  Recheck lipids in 2 months. ALT as well.  Candee Furbish, MD

## 2019-01-17 NOTE — Telephone Encounter (Signed)
Called and made patient aware of results and recommendations to switch to crestor 20 mg QD for LDL goal <70. Patient verbalized understanding. Fasting lab appointment made for 9/9. Rx sent to preferred pharmacy.

## 2019-03-22 ENCOUNTER — Other Ambulatory Visit: Payer: Medicare Other | Admitting: *Deleted

## 2019-03-22 ENCOUNTER — Other Ambulatory Visit: Payer: Self-pay

## 2019-03-22 DIAGNOSIS — E785 Hyperlipidemia, unspecified: Secondary | ICD-10-CM

## 2019-03-22 DIAGNOSIS — Z8249 Family history of ischemic heart disease and other diseases of the circulatory system: Secondary | ICD-10-CM | POA: Diagnosis not present

## 2019-03-22 DIAGNOSIS — R079 Chest pain, unspecified: Secondary | ICD-10-CM | POA: Diagnosis not present

## 2019-03-22 LAB — LIPID PANEL
Chol/HDL Ratio: 1.9 ratio (ref 0.0–5.0)
Cholesterol, Total: 181 mg/dL (ref 100–199)
HDL: 97 mg/dL (ref >39)
LDL Chol Calc (NIH): 75 mg/dL (ref 0–99)
Triglycerides: 46 mg/dL (ref 0–149)
VLDL Cholesterol Cal: 9 mg/dL (ref 5–40)

## 2019-03-22 LAB — ALT: ALT: 12 IU/L (ref 0–44)

## 2019-04-19 DIAGNOSIS — H2513 Age-related nuclear cataract, bilateral: Secondary | ICD-10-CM | POA: Diagnosis not present

## 2019-04-20 ENCOUNTER — Other Ambulatory Visit: Payer: Self-pay

## 2019-04-20 ENCOUNTER — Encounter: Payer: Self-pay | Admitting: Family Medicine

## 2019-04-20 ENCOUNTER — Ambulatory Visit (INDEPENDENT_AMBULATORY_CARE_PROVIDER_SITE_OTHER): Payer: Medicare Other | Admitting: Family Medicine

## 2019-04-20 VITALS — BP 140/76 | HR 67 | Temp 97.9°F | Ht 69.0 in | Wt 159.0 lb

## 2019-04-20 DIAGNOSIS — Z23 Encounter for immunization: Secondary | ICD-10-CM

## 2019-04-20 DIAGNOSIS — R634 Abnormal weight loss: Secondary | ICD-10-CM

## 2019-04-20 DIAGNOSIS — N401 Enlarged prostate with lower urinary tract symptoms: Secondary | ICD-10-CM | POA: Diagnosis not present

## 2019-04-20 DIAGNOSIS — R3914 Feeling of incomplete bladder emptying: Secondary | ICD-10-CM

## 2019-04-20 NOTE — Progress Notes (Signed)
10/8/202011:59 AM  Anthony Juarez September 09, 1945, 73 y.o., male [id number]  Chief Complaint  Patient presents with  . Weight Loss    unexplained over the past yr, brought in log to show.     HPI:   Patient is a 73 y.o. male with past medical history significant for GERD, seziures, BPH, central sleep apnea, HLP who presents today for concerns for weight loss  Has been on PPI from April to Aug due to GERD Base weight ~ 175 lbs Has become avid hiker this year with a club, hike 8-10 miles twice a week Has changed diet due to GERD Was stressed at beginning of covid but now feels that mood has stabilized He overall feels well  Lab Results  Component Value Date   CHOL 181 03/22/2019   HDL 97 03/22/2019   LDLCALC 75 03/22/2019   TRIG 46 03/22/2019   CHOLHDL 1.9 03/22/2019   Lab Results  Component Value Date   ALT 12 03/22/2019   AST 22 11/08/2018   ALKPHOS 54 11/08/2018   BILITOT 0.4 11/08/2018   Lab Results  Component Value Date   WBC 9.2 11/11/2018   HGB 13.6 11/11/2018   HCT 40.6 11/11/2018   MCV 92.1 11/11/2018   PLT 238 11/11/2018   Lab Results  Component Value Date   CREATININE 1.01 11/11/2018   BUN 12 11/11/2018   NA 135 11/11/2018   K 3.7 11/11/2018   CL 104 11/11/2018   CO2 24 11/11/2018   Lab Results  Component Value Date   PSA 0.4 05/14/2016   PSA 0.8 04/11/2016   PSA 0.90 03/24/2015    Colonoscopy in feb 2012, repeat in 5 years, however after review of records, changed to 10 years  Depression screen [facility] 2/9 11/08/2018 07/08/2018 12/09/2017  Decreased Interest 0 0 0  Down, Depressed, Hopeless 0 0 0  PHQ - 2 Score 0 0 0    Fall Risk  04/20/2019 11/08/2018 07/08/2018 12/09/2017 09/22/2017  Falls in the past year? 0 0 0 No No  Number falls in past yr: 0 0 - - -  Injury with Fall? 0 0 - - -  Comment - - - - -     Allergies  Allergen Reactions  . Penicillins Rash    Did it involve swelling of the face/tongue/throat, SOB, or low BP? No Did it  involve sudden or severe rash/hives, skin peeling, or any reaction on the inside of your mouth or nose? Yes Did you need to seek medical attention at a hospital or doctor's office? No When did it last happen?Decades ago. If all above answers are "NO", may proceed with cephalosporin use.     Prior to Admission medications   Medication Sig Start Date End Date Taking? Authorizing Provider  albuterol (PROVENTIL HFA;VENTOLIN HFA) 108 (90 Base) MCG/ACT inhaler Inhale 2 puffs into the lungs every 4 (four) hours as needed for wheezing or shortness of breath (cough, shortness of breath or wheezing.). 06/01/17  Yes Shawnee Knapp, MD  aspirin EC 81 MG tablet Take 81 mg by mouth 2 (two) times a week. Tues and Sat   Yes [provider]  doxazosin (CARDURA) 2 MG tablet Take 1 tablet (2 mg total) by mouth daily. 07/08/18  Yes Shawnee Knapp, MD  finasteride (PROSCAR) 5 MG tablet Take 1 tablet (5 mg total) by mouth daily. 07/08/18  Yes Shawnee Knapp, MD  fluticasone (FLONASE) 50 MCG/ACT nasal spray USE 2 SPRAYS IN EACH NOSTRIL AT BEDTIME.  Patient taking differently: Place 2 sprays into both nostrils at bedtime.  07/08/18  Yes Shawnee Knapp, MD  LamoTRIgine 200 MG TB24 24 hour tablet Take 1 tablet (200 mg total) by mouth at bedtime. 06/21/18  Yes Marcial Pacas, MD  montelukast (SINGULAIR) 10 MG tablet Take 1 tablet (10 mg total) by mouth at bedtime. 07/08/18  Yes Shawnee Knapp, MD  Multiple Vitamins-Minerals (MULTIVITAMIN WITH MINERALS) tablet Take 1 tablet by mouth daily.   Yes [provider]  pseudoephedrine (SUDAFED) 30 MG tablet Take 30 mg by mouth every 4 (four) hours as needed for congestion.   Yes [provider]  rosuvastatin (CRESTOR) 20 MG tablet Take 1 tablet (20 mg total) by mouth daily. 01/17/19  Yes Jerline Pain, MD  Spacer/Aero-Holding Chambers DEVI 1 Device by Does not apply route every 4 (four) hours as needed. 10/31/18  Yes Robyn Haber, MD    Past Medical History:   Diagnosis Date  . Allergy   . Arthritis   . Asthma    childhood  . AVM (arteriovenous malformation) brain 06/15/2013  . Balanitis   . Benign prostatic hypertrophy   . Central sleep apnea   . Cholecystitis with cholelithiasis   . Chronic sinus bradycardia 07/09/2018  . Convulsive disorder ([place])   . Esophageal reflux 03/28/2015  . History of nuclear stress test    Myoview 11/16: EF 60%, normal perfusion, Low Risk  . Hyperlipidemia   . Prostatitis   . Seizures ([place])     Past Surgical History:  Procedure Laterality Date  . ELBOW SURGERY  11/11/2000   repair  . FRACTURE SURGERY     LEFT ELBOW  . TONSILLECTOMY      Social History   Tobacco Use  . Smoking status: Former Smoker    Packs/day: 1.00    Years: 10.00    Pack years: 10.00    Quit date: 07/13/1977    Years since quitting: 41.7  . Smokeless tobacco: Never Used  Substance Use Topics  . Alcohol use: Yes    Alcohol/week: 2.0 standard drinks    Types: 2 Cans of beer per week    Comment: 2 beers a day    Family History  Problem Relation Age of Onset  . Heart disease Father        cabg 40's  . Cancer Father        Prostate  . Heart disease Maternal Grandfather        Late 65's  . Dementia Mother     Review of Systems  Constitutional: Positive for weight loss. Negative for chills, diaphoresis, fever and malaise/fatigue.  Respiratory: Negative for cough and shortness of breath.   Cardiovascular: Negative for chest pain, palpitations and leg swelling.  Gastrointestinal: Positive for constipation (mild, chronic, manages with miralax) and heartburn. Negative for abdominal pain, blood in stool, diarrhea, melena, nausea and vomiting.       Denies dysphagia.  Genitourinary: Negative for frequency, hematuria and urgency.  Musculoskeletal: Negative for back pain.  Neurological: Negative for dizziness, focal weakness and headaches.  Endo/Heme/Allergies: Negative for polydipsia. Does not bruise/bleed easily.   Psychiatric/Behavioral: Negative for depression and suicidal ideas. The patient does not have insomnia.      OBJECTIVE:  Today's Vitals   04/20/19 1149  BP: 140/76  Pulse: 67  Temp: 97.9 F (36.6 C)  SpO2: 98%  Weight: 159 lb (72.1 kg)  Height: 5\' 9"  (1.753 m)   Body mass index is 23.48 kg/m.  Physical Exam Vitals signs and nursing note reviewed.  Constitutional:      Appearance: He is well-developed.  HENT:     Head: Normocephalic and atraumatic.     Right Ear: Hearing, tympanic membrane, ear canal and external ear normal.     Left Ear: Hearing, tympanic membrane, ear canal and external ear normal.     Mouth/Throat:     Pharynx: No oropharyngeal exudate.  Eyes:     Conjunctiva/sclera: Conjunctivae normal.     Pupils: Pupils are equal, round, and reactive to light.  Neck:     Musculoskeletal: Neck supple.     Thyroid: No thyroid mass, thyromegaly or thyroid tenderness.  Cardiovascular:     Rate and Rhythm: Normal rate and regular rhythm.     Heart sounds: Normal heart sounds. No murmur. No friction rub. No gallop.   Pulmonary:     Effort: Pulmonary effort is normal.     Breath sounds: Normal breath sounds. No wheezing, rhonchi or rales.  Abdominal:     General: Bowel sounds are normal. There is no distension.     Palpations: Abdomen is soft. There is no mass.     Tenderness: There is no abdominal tenderness.  Musculoskeletal: Normal range of motion.     Right lower leg: No edema.     Left lower leg: No edema.  Lymphadenopathy:     Cervical: No cervical adenopathy.  Skin:    General: Skin is warm and dry.  Neurological:     Mental Status: He is alert and oriented to person, place, and time.     Cranial Nerves: No cranial nerve deficit.     Coordination: Coordination normal.     Gait: Gait normal.     Deep Tendon Reflexes: Reflexes are normal and symmetric.  Psychiatric:        Mood and Affect: Mood normal.        Behavior: Behavior normal.     No  results found for this or any previous visit (from the past 24 hour(s)).  No results found.   ASSESSMENT and PLAN  1. Loss of weight Overall reassuring history, exam and recent labs. Cancer screenings UTD. Most likely due to sign increase in exercise. Discussed caloric needs about ~ 2300 a day to maintain current weight. Recheck weight at next [place] order/instruction: - CBC - Basic Metabolic Panel - TSH  2. Benign prostatic hyperplasia with incomplete bladder emptying Checking PSA  3. Need for prophylactic vaccination and inoculation against influenza - Flu Vaccine QUAD High Dose(Fluad)  Return in about 4 weeks (around 05/18/2019) for weight check.    Rutherford Guys, MD Primary Care at [facility] [place], [place] [address] Ph.  [phone] Fax [phone]

## 2019-04-20 NOTE — Patient Instructions (Addendum)
  Goal is 2300 calories a day.    If you have lab work done today you will be contacted with your lab results within the next 2 weeks.  If you have not heard from [place] then please contact us. The fastest way to get your results is to register for My Chart.   IF you received an x-ray today, you will receive an invoice from [facility]. Please contact [facility] at [phone] with questions or concerns regarding your invoice.   IF you received labwork today, you will receive an invoice from [facility]. Please contact [facility] at [phone] with questions or concerns regarding your invoice.   Our billing staff will not be able to assist you with questions regarding bills from these companies.  You will be contacted with the lab results as soon as they are available. The fastest way to get your results is to activate your My Chart account. Instructions are located on the last page of this paperwork. If you have not heard from [place] regarding the results in 2 weeks, please contact this office.

## 2019-04-21 LAB — CBC
Hematocrit: 41.5 % (ref 37.5–51.0)
Hemoglobin: 14.1 g/dL (ref 13.0–17.7)
MCH: 30.6 pg (ref 26.6–33.0)
MCHC: 34 g/dL (ref 31.5–35.7)
MCV: 90 fL (ref 79–97)
Platelets: 272 10*3/uL (ref 150–450)
RBC: 4.61 x10E6/uL (ref 4.14–5.80)
RDW: 13 % (ref 11.6–15.4)
WBC: 7.5 10*3/uL (ref 3.4–10.8)

## 2019-04-21 LAB — BASIC METABOLIC PANEL
BUN/Creatinine Ratio: 13 (ref 10–24)
BUN: 13 mg/dL (ref 8–27)
CO2: 22 mmol/L (ref 20–29)
Calcium: 9.2 mg/dL (ref 8.6–10.2)
Chloride: 101 mmol/L (ref 96–106)
Creatinine, Ser: 0.98 mg/dL (ref 0.76–1.27)
GFR calc Af Amer: 89 mL/min/{1.73_m2} (ref >59)
GFR calc non Af Amer: 77 mL/min/{1.73_m2} (ref >59)
Glucose: 94 mg/dL (ref 65–99)
Potassium: 4.7 mmol/L (ref 3.5–5.2)
Sodium: 135 mmol/L (ref 134–144)

## 2019-04-21 LAB — TSH: TSH: 2.35 u[IU]/mL (ref 0.450–4.500)

## 2019-05-19 ENCOUNTER — Other Ambulatory Visit: Payer: Self-pay

## 2019-05-19 ENCOUNTER — Encounter: Payer: Self-pay | Admitting: Family Medicine

## 2019-05-19 ENCOUNTER — Ambulatory Visit (INDEPENDENT_AMBULATORY_CARE_PROVIDER_SITE_OTHER): Payer: Medicare Other | Admitting: Family Medicine

## 2019-05-19 VITALS — BP 138/70 | HR 60 | Temp 97.4°F | Ht 69.0 in | Wt 158.0 lb

## 2019-05-19 DIAGNOSIS — R634 Abnormal weight loss: Secondary | ICD-10-CM | POA: Diagnosis not present

## 2019-05-19 NOTE — Progress Notes (Signed)
11/6/20209:34 AM  Anthony Juarez [birth year], 73 y.o., male [id number]  Chief Complaint  Patient presents with  . Weight Loss    f/u (Goal is to maintain weight if not gain a bit)    HPI:   Patient is a 73 y.o. male with past medical history significant for GERD, seziures, BPH, central sleep apnea, HLP who presents today for concerns for weight loss  Patient seen a month ago Had lost weight in setting of sign increase in physical activity Discussed increase caloric intake  Has been doing well, struggles a bit with protein intake Weight stable, lost 1lb since last month Has no acute concerns  Depression screen [facility] 2/9 05/19/2019 11/08/2018 07/08/2018  Decreased Interest 0 0 0  Down, Depressed, Hopeless 0 0 0  PHQ - 2 Score 0 0 0    Fall Risk  05/19/2019 04/20/2019 11/08/2018 07/08/2018 12/09/2017  Falls in the past year? 0 0 0 0 No  Number falls in past yr: 0 0 0 - -  Injury with Fall? 0 0 0 - -  Comment - - - - -  Follow up Falls evaluation completed - - - -     Allergies  Allergen Reactions  . Penicillins Rash    Did it involve swelling of the face/tongue/throat, SOB, or low BP? No Did it involve sudden or severe rash/hives, skin peeling, or any reaction on the inside of your mouth or nose? Yes Did you need to seek medical attention at a hospital or doctor's office? No When did it last happen?Decades ago. If all above answers are "NO", may proceed with cephalosporin use.     Prior to Admission medications   Medication Sig Start Date End Date Taking? Authorizing Provider  albuterol (PROVENTIL HFA;VENTOLIN HFA) 108 (90 Base) MCG/ACT inhaler Inhale 2 puffs into the lungs every 4 (four) hours as needed for wheezing or shortness of breath (cough, shortness of breath or wheezing.). 06/01/17  Yes Shawnee Knapp, MD  aspirin EC 81 MG tablet Take 81 mg by mouth 2 (two) times a week. Tues and Sat   Yes [provider]  doxazosin (CARDURA) 2 MG tablet Take 1 tablet (2  mg total) by mouth daily. 07/08/18  Yes Shawnee Knapp, MD  finasteride (PROSCAR) 5 MG tablet Take 1 tablet (5 mg total) by mouth daily. 07/08/18  Yes Shawnee Knapp, MD  LamoTRIgine 200 MG TB24 24 hour tablet Take 1 tablet (200 mg total) by mouth at bedtime. 06/21/18  Yes Marcial Pacas, MD  montelukast (SINGULAIR) 10 MG tablet Take 1 tablet (10 mg total) by mouth at bedtime. 07/08/18  Yes Shawnee Knapp, MD  Multiple Vitamins-Minerals (MULTIVITAMIN WITH MINERALS) tablet Take 1 tablet by mouth daily.   Yes [provider]  pseudoephedrine (SUDAFED) 30 MG tablet Take 30 mg by mouth every 4 (four) hours as needed for congestion.   Yes [provider]  rosuvastatin (CRESTOR) 20 MG tablet Take 1 tablet (20 mg total) by mouth daily. 01/17/19  Yes Jerline Pain, MD  Spacer/Aero-Holding Chambers DEVI 1 Device by Does not apply route every 4 (four) hours as needed. 10/31/18  Yes Robyn Haber, MD  fluticasone (FLONASE) 50 MCG/ACT nasal spray USE 2 SPRAYS IN EACH NOSTRIL AT BEDTIME. Patient not taking: Reported on 05/19/2019 07/08/18   Shawnee Knapp, MD    Past Medical History:  Diagnosis Date  . Allergy   . Arthritis   . Asthma    childhood  . AVM (  arteriovenous malformation) brain 06/15/2013  . Balanitis   . Benign prostatic hypertrophy   . Central sleep apnea   . Cholecystitis with cholelithiasis   . Chronic sinus bradycardia 07/09/2018  . Convulsive disorder ([place])   . Esophageal reflux 03/28/2015  . History of nuclear stress test    Myoview 11/16: EF 60%, normal perfusion, Low Risk  . Hyperlipidemia   . Prostatitis   . Seizures ([place])     Past Surgical History:  Procedure Laterality Date  . ELBOW SURGERY  11/11/2000   repair  . FRACTURE SURGERY     LEFT ELBOW  . TONSILLECTOMY      Social History   Tobacco Use  . Smoking status: Former Smoker    Packs/day: 1.00    Years: 10.00    Pack years: 10.00    Quit date: 07/13/1977    Years since quitting: 41.8  . Smokeless tobacco:  Never Used  Substance Use Topics  . Alcohol use: Yes    Alcohol/week: 2.0 standard drinks    Types: 2 Cans of beer per week    Comment: 2 beers a day    Family History  Problem Relation Age of Onset  . Heart disease Father        cabg 10's  . Cancer Father        Prostate  . Heart disease Maternal Grandfather        Late 54's  . Dementia Mother     ROS Per hpi  OBJECTIVE:  Today's Vitals   05/19/19 0915  BP: 138/70  Pulse: 60  Temp: (!) 97.4 F (36.3 C)  TempSrc: Oral  SpO2: 99%  Weight: 158 lb (71.7 kg)  Height: 5\' 9"  (1.753 m)   Body mass index is 23.33 kg/m.  Wt Readings from Last 3 Encounters:  05/19/19 158 lb (71.7 kg)  04/20/19 159 lb (72.1 kg)  11/08/18 166 lb 3.2 oz (75.4 kg)    Physical Exam Vitals signs and nursing note reviewed.  Constitutional:      Appearance: He is well-developed.  HENT:     Head: Normocephalic and atraumatic.  Eyes:     Conjunctiva/sclera: Conjunctivae normal.     Pupils: Pupils are equal, round, and reactive to light.  Neck:     Musculoskeletal: Neck supple.  Pulmonary:     Effort: Pulmonary effort is normal.  Skin:    General: Skin is warm and dry.  Neurological:     Mental Status: He is alert and oriented to person, place, and time.     No results found for this or any previous visit (from the past 24 hour(s)).  No results found.   ASSESSMENT and PLAN  1. Loss of weight Stabilized with increase caloric intake, BMI normal. Continue current regime, consider nutritionist referral.  Return for CPE.    Rutherford Guys, MD Primary Care at [place], [place] [address] Ph.  [phone] Fax [phone]

## 2019-05-19 NOTE — Patient Instructions (Signed)
° ° ° °  If you have lab work done today you will be contacted with your lab results within the next 2 weeks.  If you have not heard from us then please contact us. The fastest way to get your results is to register for My Chart. ° ° °IF you received an x-ray today, you will receive an invoice from [facility]. Please contact [facility] at [phone] with questions or concerns regarding your invoice.  ° °IF you received labwork today, you will receive an invoice from [facility]. Please contact [facility] at [phone] with questions or concerns regarding your invoice.  ° °Our billing staff will not be able to assist you with questions regarding bills from these companies. ° °You will be contacted with the lab results as soon as they are available. The fastest way to get your results is to activate your My Chart account. Instructions are located on the last page of this paperwork. If you have not heard from us regarding the results in 2 weeks, please contact this office. °  ° ° ° °

## 2019-06-27 ENCOUNTER — Other Ambulatory Visit: Payer: Self-pay

## 2019-06-27 ENCOUNTER — Encounter: Payer: Self-pay | Admitting: Neurology

## 2019-06-27 ENCOUNTER — Ambulatory Visit (INDEPENDENT_AMBULATORY_CARE_PROVIDER_SITE_OTHER): Payer: Medicare Other | Admitting: Neurology

## 2019-06-27 VITALS — BP 135/76 | HR 56 | Temp 97.1°F | Ht 69.0 in | Wt 162.8 lb

## 2019-06-27 DIAGNOSIS — G40909 Epilepsy, unspecified, not intractable, without status epilepticus: Secondary | ICD-10-CM

## 2019-06-27 MED ORDER — LAMOTRIGINE ER 200 MG PO TB24: 200.0000 mg | ORAL_TABLET | Freq: Every day | ORAL | 4 refills | Status: DC

## 2019-06-27 NOTE — Progress Notes (Signed)
PATIENT: Anthony Juarez DOB: 10/30/1945  REASON FOR VISIT: follow up HISTORY FROM: patient  HISTORY OF PRESENT ILLNESS: Today 06/27/19  HISTORY  Anthony Juarez is a 73 yo WM, following up for seizure disorder  He has PMHx of seizure since infant, he was treated with dilantin and phenobarbital for a long time, was eventurally tapered off after seizure free since age 89, he had one recurrent seizure at age 8, was put back on dilantin 400mg  qhs, has been on it since.  He had Bachelor's degree, retired as Location manager, Insurance underwriter  in 2009, moved to [place] about 2.5 years ago, now he stays at home most of time, rarely initiated activities, felt fatigue, difficulty concontrating, getting worse since summer of 2013.  His wife complains that he is not up to his house projects anymore, frequent nocturia, which has prompted recent treatment with desmopression, which helped his frequent night time awaking,  He also has sleep apnea, but could not tolerate his BiPAP machine.He is seen by Dr. Gwenette Juarez.  Dilantin level was 32 while he was taking Dilantin 100 mg 4 tablets each night, level decreased to 22 while he was taking Dilantin 100 mg 3 tablets each night, he has developed gingival hypertrophy, his brain fogginess has much improved with decreased dose of Dilantin, especially after he stopped taking Desopressin,   MRI scan of the brain showed a cavernous angioma with remote age hemorrhage in the right frontal subcortical region with  and adjacent  venous angioma.  There are moderate changes of chronic microvascular ischemia and mild degree of generalized cerebral atrophy.  Followup visit today patient has been switched to Keppra XR 750mg  daily. He is off Dilantin totally and his brain fogginess has improved. He has not had further seizure activity.  UPDATE Jan 19th 2015: He is now taking keppra xr 750mg  qhs, last seizure 1992, nocturnal seizure,  he only has mild fatigue,  no longer having brain foggy sensation We have reviewed MRI taking January 2015, continue the right inferior frontal cavernous venous angioma, mild atrophy, moderate small vessel disease,  UPDATE Jan 19th 2016; I have reviewed MRI brain (with and without): right frontal juxtacortical T2 heterogenous lesion (1.4x1.0cm), with "popcorn" appearance, consistent with cavernous malformation. There is an associated small developmental venous anomaly.  Multiple round and ovoid, periventricular, subcortical and juxtacortical T2 hyperintense foci. These findings are non-specific and considerations include autoimmune, inflammatory, post-infectious, microvascular ischemic or migraine associated etiologies.   He is taking Keppra XR 750 mg every night, there was no recurrent seizure  UPDATE Aug 01 2015: He had sleep study recently,  No need for CPAP machine, he is taking keppra xr 750mg  qhs, no recurrent seizure.   We have reviewed MRI of the brain with and without contrast in January 2017: 1. A focus of heterogenous signal in the superficial right frontal lobe with a hemosiderin rim and adjacent venous anomaly. This has characteristics of a cavernous malformation. Compared to the 07/20/2013 MRI, there has been no definite change. 2. Scattered T2/FLAIR hyperintense foci, predominantly in the subcortical deep white matter of both hemispheres. This is stable compared to the prior MRI. She is a nonspecific finding and potential etiology could be chronic microvascular ischemic change, migraine, chronic demyelination.   UPDATE Jun 21 2018: He is overall doing well, taking Keppra 750 mg tablets half tablets twice a day, does complain some moodiness, discussed with patient we decided to switch him to lamotrigine 200 mg every day  Update June 27, 2019 SS: After his last visit, Keppra was discontinued due to side effect of moodiness, switch to lamotrigine 200 mg ER.  He reports he has been doing well.  He  has not had recurrent seizure.  He reports historically his seizures occur nocturnally.  His overall health has been good, he did go to the ER earlier this year for SOB, was felt to be anxiety related.  This year he has lost 12 pounds without trying, but he has been hiking frequently.  He does report nocturia, with prostate issues, he is planning to follow-up with urology.  He is working on his diet, to increase his protein and calories.  He does report some mild short-term memory loss over time.  He presents today for evaluation unaccompanied.  REVIEW OF SYSTEMS: Out of a complete 14 system review of symptoms, the patient complains only of the following symptoms, and all other reviewed systems are negative.  Seizures  ALLERGIES: Allergies  Allergen Reactions  . Penicillins Rash    Did it involve swelling of the face/tongue/throat, SOB, or low BP? No Did it involve sudden or severe rash/hives, skin peeling, or any reaction on the inside of your mouth or nose? Yes Did you need to seek medical attention at a hospital or doctor's office? No When did it last happen?Decades ago. If all above answers are "NO", may proceed with cephalosporin use.     HOME MEDICATIONS: Outpatient Medications Prior to Visit  Medication Sig Dispense Refill  . albuterol (PROVENTIL HFA;VENTOLIN HFA) 108 (90 Base) MCG/ACT inhaler Inhale 2 puffs into the lungs every 4 (four) hours as needed for wheezing or shortness of breath (cough, shortness of breath or wheezing.). 1 Inhaler 2  . aspirin EC 81 MG tablet Take 81 mg by mouth 2 (two) times a week. Tues and Sat    . doxazosin (CARDURA) 2 MG tablet Take 1 tablet (2 mg total) by mouth daily. 90 tablet 3  . finasteride (PROSCAR) 5 MG tablet Take 1 tablet (5 mg total) by mouth daily. 90 tablet 3  . LamoTRIgine 200 MG TB24 24 hour tablet Take 1 tablet (200 mg total) by mouth at bedtime. 30 tablet 11  . montelukast (SINGULAIR) 10 MG tablet Take 1 tablet (10 mg total) by mouth  at bedtime. 90 tablet 3  . Multiple Vitamins-Minerals (MULTIVITAMIN WITH MINERALS) tablet Take 1 tablet by mouth daily.    . NON FORMULARY Take by mouth at bedtime. Calcium 600 mg, Magnesium 300 mg and Vitamin D3    . pseudoephedrine (SUDAFED) 30 MG tablet Take 30 mg by mouth every 4 (four) hours as needed for congestion.    . rosuvastatin (CRESTOR) 20 MG tablet Take 1 tablet (20 mg total) by mouth daily. 90 tablet 3  . Spacer/Aero-Holding Chambers DEVI 1 Device by Does not apply route every 4 (four) hours as needed. 1 each 2  . fluticasone (FLONASE) 50 MCG/ACT nasal spray USE 2 SPRAYS IN EACH NOSTRIL AT BEDTIME. (Patient not taking: Reported on 06/27/2019) 16 g 5   No facility-administered medications prior to visit.    PAST MEDICAL HISTORY: Past Medical History:  Diagnosis Date  . Allergy   . Arthritis   . Asthma    childhood  . AVM (arteriovenous malformation) brain 06/15/2013  . Balanitis   . Benign prostatic hypertrophy   . Central sleep apnea   . Cholecystitis with cholelithiasis   . Chronic sinus bradycardia 07/09/2018  . Convulsive disorder ([place])   . Esophageal reflux 03/28/2015  .  History of nuclear stress test    Myoview 11/16: EF 60%, normal perfusion, Low Risk  . Hyperlipidemia   . Prostatitis   . Seizures ([place])     PAST SURGICAL HISTORY: Past Surgical History:  Procedure Laterality Date  . ELBOW SURGERY  11/11/2000   repair  . FRACTURE SURGERY     LEFT ELBOW  . TONSILLECTOMY      FAMILY HISTORY: Family History  Problem Relation Age of Onset  . Heart disease Father        cabg 71's  . Cancer Father        Prostate  . Heart disease Maternal Grandfather        Late 37's  . Dementia Mother     SOCIAL HISTORY: Social History   Socioeconomic History  . Marital status: Married    Spouse name: [name]  . Number of children: Not on file  . Years of education: Not on file  . Highest education level: Bachelor's degree (e.g., BA, AB, BS)  Occupational  History  . Occupation: retired    Fish farm manager: RETIRED  Tobacco Use  . Smoking status: Former Smoker    Packs/day: 1.00    Years: 10.00    Pack years: 10.00    Quit date: 07/13/1977    Years since quitting: 41.9  . Smokeless tobacco: Never Used  Substance and Sexual Activity  . Alcohol use: Yes    Alcohol/week: 2.0 standard drinks    Types: 2 Cans of beer per week    Comment: 2 beers a day  . Drug use: No  . Sexual activity: Not Currently    Birth control/protection: Post-menopausal, Abstinence  Other Topics Concern  . Not on file  Social History Narrative   Patient worked in [employer].   Social Determinants of Health   Financial Resource Strain:   . Difficulty of Paying Living Expenses: Not on file  Food Insecurity:   . Worried About Charity fundraiser in the Last Year: Not on file  . Ran Out of Food in the Last Year: Not on file  Transportation Needs:   . Lack of Transportation (Medical): Not on file  . Lack of Transportation (Non-Medical): Not on file  Physical Activity: Insufficiently Active  . Days of Exercise per Week: 4 days  . Minutes of Exercise per Session: 30 min  Stress:   . Feeling of Stress : Not on file  Social Connections:   . Frequency of Communication with Friends and Family: Not on file  . Frequency of Social Gatherings with Friends and Family: Not on file  . Attends Religious Services: Not on file  . Active Member of Clubs or Organizations: Not on file  . Attends Archivist Meetings: Not on file  . Marital Status: Not on file  Intimate Partner Violence:   . Fear of Current or Ex-Partner: Not on file  . Emotionally Abused: Not on file  . Physically Abused: Not on file  . Sexually Abused: Not on file   PHYSICAL EXAM  Vitals:   06/27/19 0921  BP: 135/76  Pulse: (!) 56  Temp: (!) 97.1 F (36.2 C)  TempSrc: Oral  Weight: 162 lb 12.8 oz (73.8 kg)  Height: 5\' 9"  (1.753 m)   Body mass index is 24.04 kg/m.  Generalized: Well  developed, in no acute distress   Neurological examination  Mentation: Alert oriented to time, place, history taking. Follows all commands speech and language fluent Cranial nerve II-XII: Pupils were equal  round reactive to light. Extraocular movements were full, visual field were full on confrontational test. Facial sensation and strength were normal.  Head turning and shoulder shrug  were normal and symmetric. Motor: The motor testing reveals 5 over 5 strength of all 4 extremities. Good symmetric motor tone is noted throughout.  Sensory: Sensory testing is intact to soft touch on all 4 extremities. No evidence of extinction is noted.  Coordination: Cerebellar testing reveals good finger-nose-finger and heel-to-shin bilaterally.  Gait and station: Gait is normal. Tandem gait is normal.  Reflexes: Deep tendon reflexes are symmetric and normal bilaterally.   DIAGNOSTIC DATA (LABS, IMAGING, TESTING) - I reviewed patient records, labs, notes, testing and imaging myself where available.  Lab Results  Component Value Date   WBC 7.5 04/20/2019   HGB 14.1 04/20/2019   HCT 41.5 04/20/2019   MCV 90 04/20/2019   PLT 272 04/20/2019      Component Value Date/Time   NA 135 04/20/2019 1445   K 4.7 04/20/2019 1445   CL 101 04/20/2019 1445   CO2 22 04/20/2019 1445   GLUCOSE 94 04/20/2019 1445   GLUCOSE 93 11/11/2018 2255   BUN 13 04/20/2019 1445   CREATININE 0.98 04/20/2019 1445   CREATININE 1.25 05/14/2016 0900   CALCIUM 9.2 04/20/2019 1445   PROT 6.2 11/08/2018 1555   ALBUMIN 4.3 11/08/2018 1555   AST 22 11/08/2018 1555   ALT 12 03/22/2019 0821   ALKPHOS 54 11/08/2018 1555   BILITOT 0.4 11/08/2018 1555   GFRNONAA 77 04/20/2019 1445   GFRAA 89 04/20/2019 1445   Lab Results  Component Value Date   CHOL 181 03/22/2019   HDL 97 03/22/2019   LDLCALC 75 03/22/2019   TRIG 46 03/22/2019   CHOLHDL 1.9 03/22/2019   No results found for: HGBA1C Lab Results  Component Value Date    VITAMINB12 815 11/12/2016   Lab Results  Component Value Date   TSH 2.350 04/20/2019      ASSESSMENT AND PLAN 73 y.o. year old male  has a past medical history of Allergy, Arthritis, Asthma, AVM (arteriovenous malformation) brain (06/15/2013), Balanitis, Benign prostatic hypertrophy, Central sleep apnea, Cholecystitis with cholelithiasis, Chronic sinus bradycardia (07/09/2018), Convulsive disorder ([place]), Esophageal reflux (03/28/2015), History of nuclear stress test, Hyperlipidemia, Prostatitis, and Seizures ([place]). here with:  1.  Epilepsy -No recurrent seizure -Continue lamotrigine 200 mg ER daily -Tolerating well, no side effect -We will check CMP, Lamictal level today (CBC was normal Oct 2020) -He will follow-up in 1 year or sooner if needed, call for recurrent seizure  2.  Right frontal cavernous angioma, supratentorium small vessel disease -Has been stable, discussed with Dr. Krista Blue, no need to repeat imaging at this point unless new symptoms, worsening seizures, will continue to monitor overtime.  -MRI of the brain January 2017 IMPRESSION: This MRI of the brain with and without contrast shows the following: 1. A focus of heterogenous signal in the superficial right frontal lobe with a hemosiderin rim and adjacent venous anomaly. This has characteristics of a cavernous malformation. Compared to the 07/20/2013 MRI, there has been no definite change. 2. Scattered T2/FLAIR hyperintense foci, predominantly in the subcortical deep white matter of both hemispheres. This is stable compared to the prior MRI. She is a nonspecific finding and could [facility] due to chronic microvascular ischemic change, migraine, chronic demyelination. None of the foci appears to be acute.  I spent 15 minutes with the patient. 50% of this time was spent discussing his plan of  care.  Butler Denmark, AGNP-C, DNP 06/27/2019, 9:44 AM [facility] [address], [place], [place]  [id number] [phone]

## 2019-06-27 NOTE — Patient Instructions (Signed)
Continue taking Lamictal   I will check lab work today  Call for recurrent seizure   Follow-up in 1 year or sooner if needed

## 2019-06-28 NOTE — Progress Notes (Signed)
I have reviewed and agreed above plan. 

## 2019-07-04 ENCOUNTER — Telehealth: Payer: Self-pay | Admitting: *Deleted

## 2019-07-04 ENCOUNTER — Telehealth: Payer: Self-pay

## 2019-07-04 LAB — COMPREHENSIVE METABOLIC PANEL
ALT: 14 IU/L (ref 0–44)
AST: 27 IU/L (ref 0–40)
Albumin/Globulin Ratio: 2.4 — ABNORMAL HIGH (ref 1.2–2.2)
Albumin: 4.3 g/dL (ref 3.7–4.7)
Alkaline Phosphatase: 64 IU/L (ref 39–117)
BUN/Creatinine Ratio: 15 (ref 10–24)
BUN: 16 mg/dL (ref 8–27)
Bilirubin Total: 0.5 mg/dL (ref 0.0–1.2)
CO2: 24 mmol/L (ref 20–29)
Calcium: 9.4 mg/dL (ref 8.6–10.2)
Chloride: 104 mmol/L (ref 96–106)
Creatinine, Ser: 1.05 mg/dL (ref 0.76–1.27)
GFR calc Af Amer: 82 mL/min/{1.73_m2} (ref >59)
GFR calc non Af Amer: 71 mL/min/{1.73_m2} (ref >59)
Globulin, Total: 1.8 g/dL (ref 1.5–4.5)
Glucose: 80 mg/dL (ref 65–99)
Potassium: 4.5 mmol/L (ref 3.5–5.2)
Sodium: 139 mmol/L (ref 134–144)
Total Protein: 6.1 g/dL (ref 6.0–8.5)

## 2019-07-04 LAB — LAMOTRIGINE LEVEL: Lamotrigine Lvl: 4 ug/mL (ref 2.0–20.0)

## 2019-07-04 NOTE — Telephone Encounter (Signed)
Schedule AWV.  

## 2019-07-04 NOTE — Telephone Encounter (Signed)
Called to go over pts recent lab results. Per NP Butler Denmark No answer. Left voicemail for call back. If Pt calls back please relay Message:  "Please call the patient. Labs look good. Lamictal level is therapeutic, CMP is stable, kidney, electrolytes, and liver function is normal." -Per NP Butler Denmark

## 2019-07-05 ENCOUNTER — Other Ambulatory Visit: Payer: Self-pay | Admitting: Family Medicine

## 2019-07-05 NOTE — Telephone Encounter (Signed)
Patient stated that he is completely out of medications and would like request expedited.

## 2019-07-05 NOTE — Telephone Encounter (Signed)
Requested medication (s) are due for refill today: yes  Requested medication (s) are on the active medication list: yes  Last refill: 04/04/2019  Future visit scheduled: yes  Notes to clinic:  Last filled by Dr. Brigitte Pulse Review for refill  Requested Prescriptions  Pending Prescriptions Disp Refills   finasteride (PROSCAR) 5 MG tablet [Pharmacy Med Name: FINASTERIDE 5 MG TABLET] 90 tablet 0    Sig: TAKE 1 TABLET ONCE DAILY.      Urology: 5-alpha Reductase Inhibitors Passed - 07/05/2019  9:30 AM      Passed - Valid encounter within last 12 months    Recent Outpatient Visits           1 month ago Loss of weight   [facility], Lilia Argue, MD   2 months ago Loss of weight   [facility], Lilia Argue, MD   7 months ago Lipoma of torso   [facility] at [facility], Arlie Solomons, MD   12 months ago Medicare annual wellness visit, subsequent   [facility] at [facility], [name], MD   1 year ago Need for prophylactic vaccination and inoculation against influenza   [facility], Lilia Argue, MD       Future Appointments             In 1 week Rutherford Guys, MD Primary [facility] at [place], [facility]              doxazosin (CARDURA) 2 MG tablet [Pharmacy Med Name: DOXAZOSIN MESYLATE 2 MG TAB] 90 tablet 0    Sig: TAKE 1 TABLET ONCE DAILY.      Cardiovascular:  Alpha Blockers Passed - 07/05/2019  9:30 AM      Passed - Last BP in normal range    BP Readings from Last 1 Encounters:  06/27/19 135/76          Passed - Valid encounter within last 6 months    Recent Outpatient Visits           1 month ago Loss of weight   [facility], Lilia Argue, MD   2 months ago Loss of weight   [facility], Lilia Argue, MD   7 months ago Lipoma of torso   [facility] at [facility], Arlie Solomons, MD   12 months ago Medicare annual wellness visit, subsequent   [facility], [name], MD   1 year ago Need for  prophylactic vaccination and inoculation against influenza   [facility], Lilia Argue, MD       Future Appointments             In 1 week Rutherford Guys, MD Primary Care at [place], [facility]

## 2019-07-10 ENCOUNTER — Encounter: Payer: Medicare Other | Admitting: Family Medicine

## 2019-07-13 ENCOUNTER — Encounter: Payer: Self-pay | Admitting: Family Medicine

## 2019-07-13 ENCOUNTER — Ambulatory Visit (INDEPENDENT_AMBULATORY_CARE_PROVIDER_SITE_OTHER): Payer: Medicare Other | Admitting: Family Medicine

## 2019-07-13 ENCOUNTER — Other Ambulatory Visit: Payer: Self-pay

## 2019-07-13 VITALS — BP 134/64 | HR 50 | Temp 97.7°F | Ht 69.0 in | Wt 164.0 lb

## 2019-07-13 DIAGNOSIS — Z0001 Encounter for general adult medical examination with abnormal findings: Secondary | ICD-10-CM

## 2019-07-13 DIAGNOSIS — R3914 Feeling of incomplete bladder emptying: Secondary | ICD-10-CM

## 2019-07-13 DIAGNOSIS — E782 Mixed hyperlipidemia: Secondary | ICD-10-CM

## 2019-07-13 DIAGNOSIS — N401 Enlarged prostate with lower urinary tract symptoms: Secondary | ICD-10-CM

## 2019-07-13 DIAGNOSIS — Z0000 Encounter for general adult medical examination without abnormal findings: Secondary | ICD-10-CM

## 2019-07-13 DIAGNOSIS — J3089 Other allergic rhinitis: Secondary | ICD-10-CM | POA: Diagnosis not present

## 2019-07-13 MED ORDER — MONTELUKAST SODIUM 10 MG PO TABS: 10.0000 mg | ORAL_TABLET | Freq: Every day | ORAL | 3 refills | Status: DC

## 2019-07-13 MED ORDER — FLUTICASONE PROPIONATE 50 MCG/ACT NA SUSP: 1.0000 | Freq: Two times a day (BID) | NASAL | 6 refills | Status: DC

## 2019-07-13 NOTE — Progress Notes (Signed)
Presents today for [employer] Visit-Subsequent.   Date of last exam: 07/08/2018  Interpreter used for this visit? n/a  Patient Care Team: Rutherford Guys, MD as PCP - General (Family Medicine) Jerline Pain, MD as PCP - Cardiology (Cardiology) Marcial Pacas, MD as Consulting Physician (Neurology) Sherlynn Stalls, MD as Consulting Physician (Ophthalmology) [facility] as Consulting Physician Carol Ada, MD as Consulting Physician (Gastroenterology)   Other items to address today: none PMH: GERD, seziures, BPH, central sleep apnea, HLP  Cancer Screening: Colon: colonoscopy 2012, repeat in 10 years   Prostate: 2019   Other Screening: Last screening for diabetes: 2019 Last lipid screening: has HLP  Lab Results  Component Value Date   CHOL 181 03/22/2019   HDL 97 03/22/2019   [place] 75 03/22/2019   TRIG 46 03/22/2019   CHOLHDL 1.9 03/22/2019    ADVANCE DIRECTIVES: Discussed: yes Patient desires CPR (unk[place]), mechanical ventilation (unknown), prolonged artificial support (may include mechanical ventilation, tube/PEG feeding, etc) (unknown). On File: no Materials Provided: yes  Immunization status:  Immunization History  Administered Date(s) Administered  . DTaP 01/15/2011  . Fluad Quad(high Dose 65+) 04/20/2019  . Influenza Whole 05/22/2011, 04/12/2012  . Influenza, High Dose Seasonal PF 05/03/2018  . Influenza,inj,Quad PF,6+ Mos 03/28/2015, 05/14/2016, 05/17/2017  . Pneumococcal Conjugate-13 03/28/2015  . Pneumococcal Polysaccharide-23 01/12/2014  . Td 05/14/2016  . Zoster 01/29/2014     There are no preventive care reminders to display for this patient.   Functional Status Survey: Is the patient deaf or have difficulty hearing?: No Does the patient have difficulty seeing, even when wearing glasses/contacts?: No Does the patient have difficulty concentrating, remembering, or making decisions?: Yes(short term memory) Does the  patient have difficulty walking or climbing stairs?: No Does the patient have difficulty dressing or bathing?: No Does the patient have difficulty doing errands alone such as visiting a doctor's office or shopping?: No   6CIT Screen 07/13/2019 07/08/2018 05/17/2017  What Year? 0 points 0 points 0 points  What month? 0 points 0 points 0 points  What time? 0 points 0 points 0 points  Count back from 20 0 points 0 points 0 points  Months in reverse 0 points 0 points 0 points  Repeat phrase 0 points 0 points 2 points  Total Score 0 0 2     Home Environment: no falls or safety concerns  Urinary Incontinence Screening: BPH not controlled with doxazosin and finesteride, nocturia x 3-4, requesting referral to urology  Patient Active Problem List   Diagnosis Date Noted  . Chronic sinus bradycardia 07/09/2018  . Abnormal bowel movement 07/09/2018  . Hyperinflation of lungs 07/09/2018  . Non-seasonal allergic rhinitis 07/09/2018  . Abnormal finding on MRI of brain 12/04/2016  . Benign prostatic hyperplasia with incomplete bladder emptying 11/11/2016  . Hyperlipidemia 05/13/2016  . Esophageal reflux 03/28/2015  . Shortness of breath 03/28/2015  . Skin lesion of face 03/28/2015  . AVM (arteriovenous malformation) brain 06/15/2013  . Seizure disorder ([place]) 01/19/2013  . Central sleep apnea 06/10/2012  . Epigastric pain 06/06/2012  . Gallstones 06/06/2012  . Nausea 06/06/2012     Past Medical History:  Diagnosis Date  . Allergy   . Arthritis   . Asthma    childhood  . AVM (arteriovenous malformation) brain 06/15/2013  . Balanitis   . Benign prostatic hypertrophy   . Central sleep apnea   . Cholecystitis with cholelithiasis   . Chronic sinus bradycardia 07/09/2018  .  Convulsive disorder ([place])   . Esophageal reflux 03/28/2015  . History of nuclear stress test    Myoview 11/16: EF 60%, normal perfusion, Low Risk  . Hyperlipidemia   . Prostatitis   . Seizures ([place])      Past  Surgical History:  Procedure Laterality Date  . ELBOW SURGERY  11/11/2000   repair  . FRACTURE SURGERY     LEFT ELBOW  . TONSILLECTOMY       Family History  Problem Relation Age of Onset  . Heart disease Father        cabg 41's  . Cancer Father        Prostate  . Heart disease Maternal Grandfather        Late 34's  . Dementia Mother      Social History   Socioeconomic History  . Marital status: Married    Spouse name: [name]  . Number of children: Not on file  . Years of education: Not on file  . Highest education level: Bachelor's degree (e.g., BA, AB, BS)  Occupational History  . Occupation: retired    Fish farm manager: RETIRED  Tobacco Use  . Smoking status: Former Smoker    Packs/day: 1.00    Years: 10.00    Pack years: 10.00    Quit date: 07/13/1977    Years since quitting: 42.0  . Smokeless tobacco: Never Used  Substance and Sexual Activity  . Alcohol use: Yes    Alcohol/week: 2.0 standard drinks    Types: 2 Cans of beer per week    Comment: 2 beers a day  . Drug use: No  . Sexual activity: Not Currently    Birth control/protection: Post-menopausal, Abstinence  Other Topics Concern  . Not on file  Social History Narrative   Patient worked in [employer].   Social Determinants of Health   Financial Resource Strain:   . Difficulty of Paying Living Expenses: Not on file  Food Insecurity:   . Worried About Charity fundraiser in the Last Year: Not on file  . Ran Out of Food in the Last Year: Not on file  Transportation Needs:   . Lack of Transportation (Medical): Not on file  . Lack of Transportation (Non-Medical): Not on file  Physical Activity:   . Days of Exercise per Week: Not on file  . Minutes of Exercise per Session: Not on file  Stress:   . Feeling of Stress : Not on file  Social Connections:   . Frequency of Communication with Friends and Family: Not on file  . Frequency of Social Gatherings with Friends and Family: Not on file  . Attends  Religious Services: Not on file  . Active Member of Clubs or Organizations: Not on file  . Attends Archivist Meetings: Not on file  . Marital Status: Not on file  Intimate Partner Violence:   . Fear of Current or Ex-Partner: Not on file  . Emotionally Abused: Not on file  . Physically Abused: Not on file  . Sexually Abused: Not on file     Allergies  Allergen Reactions  . Penicillins Rash    Did it involve swelling of the face/tongue/throat, SOB, or low BP? No Did it involve sudden or severe rash/hives, skin peeling, or any reaction on the inside of your mouth or nose? Yes Did you need to seek medical attention at a hospital or doctor's office? No When did it last happen?Decades ago. If all above answers are "NO", may  proceed with cephalosporin use.      Prior to Admission medications   Medication Sig Start Date End Date Taking? Authorizing Provider  albuterol (PROVENTIL HFA;VENTOLIN HFA) 108 (90 Base) MCG/ACT inhaler Inhale 2 puffs into the lungs every 4 (four) hours as needed for wheezing or shortness of breath (cough, shortness of breath or wheezing.). 06/01/17  Yes Shawnee Knapp, MD  aspirin EC 81 MG tablet Take 81 mg by mouth 2 (two) times a week. Tues and Sat   Yes [provider]  doxazosin (CARDURA) 2 MG tablet TAKE 1 TABLET ONCE DAILY. 07/05/19  Yes Rutherford Guys, MD  finasteride (PROSCAR) 5 MG tablet TAKE 1 TABLET ONCE DAILY. 07/05/19  Yes Rutherford Guys, MD  LamoTRIgine 200 MG TB24 24 hour tablet Take 1 tablet (200 mg total) by mouth at bedtime. 06/27/19  Yes Suzzanne Cloud, NP  montelukast (SINGULAIR) 10 MG tablet Take 1 tablet (10 mg total) by mouth at bedtime. 07/08/18  Yes Shawnee Knapp, MD  Multiple Vitamins-Minerals (MULTIVITAMIN WITH MINERALS) tablet Take 1 tablet by mouth daily.   Yes [provider]  NON FORMULARY Take by mouth at bedtime. Calcium 600 mg, Magnesium 300 mg and Vitamin D3   Yes [provider]    pseudoephedrine (SUDAFED) 30 MG tablet Take 30 mg by mouth every 4 (four) hours as needed for congestion.   Yes [provider]  rosuvastatin (CRESTOR) 20 MG tablet Take 1 tablet (20 mg total) by mouth daily. 01/17/19  Yes Jerline Pain, MD  Spacer/Aero-Holding Chambers DEVI 1 Device by Does not apply route every 4 (four) hours as needed. 10/31/18  Yes Robyn Haber, MD     Depression screen [facility] 2/9 07/13/2019 05/19/2019 11/08/2018 07/08/2018 12/09/2017  Decreased Interest 0 0 0 0 0  Down, Depressed, Hopeless 0 0 0 0 0  PHQ - 2 Score 0 0 0 0 0     Fall Risk  07/13/2019 05/19/2019 04/20/2019 11/08/2018 07/08/2018  Falls in the past year? 0 0 0 0 0  Number falls in past yr: 0 0 0 0 -  Injury with Fall? 0 0 0 0 -  Comment - - - - -  Follow up - Falls evaluation completed - - -      PHYSICAL EXAM: BP 134/64   Pulse (!) 50   Temp 97.7 F (36.5 C)   Ht 5\' 9"  (1.753 m)   Wt 164 lb (74.4 kg)   SpO2 100%   BMI 24.22 kg/m    Wt Readings from Last 3 Encounters:  07/13/19 164 lb (74.4 kg)  06/27/19 162 lb 12.8 oz (73.8 kg)  05/19/19 158 lb (71.7 kg)      Hearing Screening   125Hz  250Hz  500Hz  1000Hz  2000Hz  3000Hz  4000Hz  6000Hz  8000Hz   Right ear:           Left ear:             Visual Acuity Screening   Right eye Left eye Both eyes  Without correction:     With correction: 20/25 20/20 20/20       Physical Exam   Education/Counseling provided regarding diet and exercise, prevention of chronic diseases, smoking/tobacco cessation, if applicable, and reviewed "Covered Medicare Preventive Services."   ASSESSMENT/PLAN: 1. Medicare annual wellness visit, subsequent Routine HCM labs ordered. HCM reviewed/discussed. Anticipatory guidance regarding healthy weight, lifestyle and choices given.   2. Benign prostatic hyperplasia with incomplete bladder emptying - PSA - Ambulatory referral to Urology  3. Mixed hyperlipidemia - CMET with GFR - Lipid panel  4.  Non-seasonal allergic rhinitis, unspecified trigger - montelukast (SINGULAIR) 10 MG tablet; Take 1 tablet (10 mg total) by mouth at bedtime. - fluticasone (FLONASE) 50 MCG/ACT nasal spray; Place 1 spray into both nostrils 2 (two) times daily.

## 2019-07-13 NOTE — Patient Instructions (Addendum)
   If you have lab work done today you will be contacted with your lab results within the next 2 weeks.  If you have not heard from us then please contact us. The fastest way to get your results is to register for My Chart.   IF you received an x-ray today, you will receive an invoice from [facility]. Please contact [facility] at [phone] with questions or concerns regarding your invoice.   IF you received labwork today, you will receive an invoice from [facility]. Please contact [facility] at [phone] with questions or concerns regarding your invoice.   Our billing staff will not be able to assist you with questions regarding bills from these companies.  You will be contacted with the lab results as soon as they are available. The fastest way to get your results is to activate your My Chart account. Instructions are located on the last page of this paperwork. If you have not heard from us regarding the results in 2 weeks, please contact this office.     Preventive Care 65 Years and Older, Male Preventive care refers to lifestyle choices and visits with your health care provider that can promote health and wellness. This includes:  A yearly physical exam. This is also called an annual well check.  Regular dental and eye exams.  Immunizations.  Screening for certain conditions.  Healthy lifestyle choices, such as diet and exercise. What can I expect for my preventive care visit? Physical exam Your health care provider will check:  Height and weight. These may be used to calculate body mass index (BMI), which is a measurement that tells if you are at a healthy weight.  Heart rate and blood pressure.  Your skin for abnormal spots. Counseling Your health care provider may ask you questions about:  Alcohol, tobacco, and drug use.  Emotional well-being.  Home and relationship well-being.  Sexual activity.  Eating habits.  History of  falls.  Memory and ability to understand (cognition).  Work and work environment. What immunizations do I need?  Influenza (flu) vaccine  This is recommended every year. Tetanus, diphtheria, and pertussis (Tdap) vaccine  You may need a Td booster every 10 years. Varicella (chickenpox) vaccine  You may need this vaccine if you have not already been vaccinated. Zoster (shingles) vaccine  You may need this after age 60. Pneumococcal conjugate (PCV13) vaccine  One dose is recommended after age 65. Pneumococcal polysaccharide (PPSV23) vaccine  One dose is recommended after age 65. Measles, mumps, and rubella (MMR) vaccine  You may need at least one dose of MMR if you were born in 1957 or later. You may also need a second dose. Meningococcal conjugate (MenACWY) vaccine  You may need this if you have certain conditions. Hepatitis A vaccine  You may need this if you have certain conditions or if you travel or work in places where you may be exposed to hepatitis A. Hepatitis B vaccine  You may need this if you have certain conditions or if you travel or work in places where you may be exposed to hepatitis B. Haemophilus influenzae type b (Hib) vaccine  You may need this if you have certain conditions. You may receive vaccines as individual doses or as more than one vaccine together in one shot (combination vaccines). Talk with your health care provider about the risks and benefits of combination vaccines. What tests do I need? Blood tests  Lipid and cholesterol levels. These may be checked every 5   years, or more frequently depending on your overall health.  Hepatitis C test.  Hepatitis B test. Screening  Lung cancer screening. You may have this screening every year starting at age 26 if you have a 30-pack-year history of smoking and currently smoke or have quit within the past 15 years.  Colorectal cancer screening. All adults should have this screening starting at age 22  and continuing until age 76. Your health care provider may recommend screening at age 101 if you are at increased risk. You will have tests every 1-10 years, depending on your results and the type of screening test.  Prostate cancer screening. Recommendations will vary depending on your family history and other risks.  Diabetes screening. This is done by checking your blood sugar (glucose) after you have not eaten for a while (fasting). You may have this done every 1-3 years.  Abdominal aortic aneurysm (AAA) screening. You may need this if you are a current or former smoker.  Sexually transmitted disease (STD) testing. Follow these instructions at home: Eating and drinking  Eat a diet that includes fresh fruits and vegetables, whole grains, lean protein, and low-fat dairy products. Limit your intake of foods with high amounts of sugar, saturated fats, and salt.  Take vitamin and mineral supplements as recommended by your health care provider.  Do not drink alcohol if your health care provider tells you not to drink.  If you drink alcohol: ? Limit how much you have to 0-2 drinks a day. ? Be aware of how much alcohol is in your drink. In the [place] one drink equals one 12 oz bottle of beer (355 mL), one 5 oz glass of wine (148 mL), or one 1 oz glass of hard liquor (44 mL). Lifestyle  Take daily care of your teeth and gums.  Stay active. Exercise for at least 30 minutes on 5 or more days each week.  Do not use any products that contain nicotine or tobacco, such as cigarettes, e-cigarettes, and chewing tobacco. If you need help quitting, ask your health care provider.  If you are sexually active, practice safe sex. Use a condom or other form of protection to prevent STIs (sexually transmitted infections).  Talk with your health care provider about taking a low-dose aspirin or statin. What's next?  Visit your health care provider once a year for a well check visit.  Ask your health care  provider how often you should have your eyes and teeth checked.  Stay up to date on all vaccines. This information is not intended to replace advice given to you by your health care provider. Make sure you discuss any questions you have with your health care provider. Document Revised: 06/23/2018 Document Reviewed: 06/23/2018 Elsevier Patient Education  2020 [employer].

## 2019-07-14 LAB — CMP14+EGFR
ALT: 10 IU/L (ref 0–44)
AST: 27 IU/L (ref 0–40)
Albumin/Globulin Ratio: 2.3 — ABNORMAL HIGH (ref 1.2–2.2)
Albumin: 4.2 g/dL (ref 3.7–4.7)
Alkaline Phosphatase: 57 IU/L (ref 39–117)
BUN/Creatinine Ratio: 15 (ref 10–24)
BUN: 15 mg/dL (ref 8–27)
Bilirubin Total: 0.7 mg/dL (ref 0.0–1.2)
CO2: 23 mmol/L (ref 20–29)
Calcium: 9.3 mg/dL (ref 8.6–10.2)
Chloride: 102 mmol/L (ref 96–106)
Creatinine, Ser: 1.02 mg/dL (ref 0.76–1.27)
GFR calc Af Amer: 85 mL/min/{1.73_m2} (ref >59)
GFR calc non Af Amer: 74 mL/min/{1.73_m2} (ref >59)
Globulin, Total: 1.8 g/dL (ref 1.5–4.5)
Glucose: 97 mg/dL (ref 65–99)
Potassium: 4.3 mmol/L (ref 3.5–5.2)
Sodium: 136 mmol/L (ref 134–144)
Total Protein: 6 g/dL (ref 6.0–8.5)

## 2019-07-14 LAB — LIPID PANEL
Chol/HDL Ratio: 1.9 ratio (ref 0.0–5.0)
Cholesterol, Total: 192 mg/dL (ref 100–199)
HDL: 103 mg/dL (ref >39)
LDL Chol Calc (NIH): 80 mg/dL (ref 0–99)
Triglycerides: 47 mg/dL (ref 0–149)
VLDL Cholesterol Cal: 9 mg/dL (ref 5–40)

## 2019-07-14 LAB — PSA: Prostate Specific Ag, Serum: 1 ng/mL (ref 0.0–4.0)

## 2019-08-04 DIAGNOSIS — R351 Nocturia: Secondary | ICD-10-CM | POA: Diagnosis not present

## 2019-08-04 DIAGNOSIS — R350 Frequency of micturition: Secondary | ICD-10-CM | POA: Diagnosis not present

## 2019-08-04 DIAGNOSIS — R3121 Asymptomatic microscopic hematuria: Secondary | ICD-10-CM | POA: Diagnosis not present

## 2019-09-03 ENCOUNTER — Ambulatory Visit: Payer: Medicare Other | Attending: Internal Medicine

## 2019-09-03 DIAGNOSIS — Z23 Encounter for immunization: Secondary | ICD-10-CM | POA: Insufficient documentation

## 2019-09-03 NOTE — Progress Notes (Signed)
   Covid-19 Vaccination Clinic  Name:  Anthony Juarez    MRN: [id number] DOB: 1946-06-26  09/03/2019  [name] was observed post Covid-19 immunization for 15 minutes without incidence. He was provided with Vaccine Information Sheet and instruction to access the V-Safe system.   [name] was instructed to call 911 with any severe reactions post vaccine: Marland Kitchen Difficulty breathing  . Swelling of your face and throat  . A fast heartbeat  . A bad rash all over your body  . Dizziness and weakness    Immunizations Administered    Name Date Dose VIS Date Route   Pfizer COVID-19 Vaccine 09/03/2019  1:29 PM 0.3 mL 06/23/2019 Intramuscular   Manufacturer: [place]   Lot: [id number]   [place]: [id number]

## 2019-09-12 DIAGNOSIS — R3121 Asymptomatic microscopic hematuria: Secondary | ICD-10-CM | POA: Diagnosis not present

## 2019-09-12 DIAGNOSIS — R3129 Other microscopic hematuria: Secondary | ICD-10-CM | POA: Diagnosis not present

## 2019-09-21 DIAGNOSIS — R351 Nocturia: Secondary | ICD-10-CM | POA: Diagnosis not present

## 2019-09-21 DIAGNOSIS — R3121 Asymptomatic microscopic hematuria: Secondary | ICD-10-CM | POA: Diagnosis not present

## 2019-09-21 DIAGNOSIS — R350 Frequency of micturition: Secondary | ICD-10-CM | POA: Diagnosis not present

## 2019-09-26 DIAGNOSIS — H353132 Nonexudative age-related macular degeneration, bilateral, intermediate dry stage: Secondary | ICD-10-CM | POA: Diagnosis not present

## 2019-09-26 DIAGNOSIS — H3540 Unspecified peripheral retinal degeneration: Secondary | ICD-10-CM | POA: Diagnosis not present

## 2019-09-26 DIAGNOSIS — H43392 Other vitreous opacities, left eye: Secondary | ICD-10-CM | POA: Diagnosis not present

## 2019-09-26 DIAGNOSIS — H43813 Vitreous degeneration, bilateral: Secondary | ICD-10-CM | POA: Diagnosis not present

## 2019-09-27 ENCOUNTER — Ambulatory Visit: Payer: Medicare Other | Attending: Internal Medicine

## 2019-09-27 DIAGNOSIS — Z23 Encounter for immunization: Secondary | ICD-10-CM

## 2019-09-27 NOTE — Progress Notes (Signed)
   Covid-19 Vaccination Clinic  Name:  Anthony Juarez    MRN: [id number] DOB: 03/11/1946  09/27/2019  Mr. Anthony Juarez was observed post Covid-19 immunization for 15 minutes without incident. He was provided with Vaccine Information Sheet and instruction to access the V-Safe system.   Mr. Anthony Juarez was instructed to call 911 with any severe reactions post vaccine: Marland Kitchen Difficulty breathing  . Swelling of face and throat  . A fast heartbeat  . A bad rash all over body  . Dizziness and weakness   Immunizations Administered    Name Date Dose VIS Date Route   Pfizer COVID-19 Vaccine 09/27/2019  9:20 AM 0.3 mL 06/23/2019 Intramuscular   Manufacturer: [place]   Lot: [id number]   [place]: [id number]

## 2019-10-02 ENCOUNTER — Other Ambulatory Visit: Payer: Self-pay | Admitting: Family Medicine

## 2019-10-02 NOTE — Telephone Encounter (Signed)
Requested Prescriptions  Pending Prescriptions Disp Refills  . finasteride (PROSCAR) 5 MG tablet [Pharmacy Med Name: FINASTERIDE 5 MG TABLET] 90 tablet 3    Sig: TAKE 1 TABLET ONCE DAILY.     Urology: 5-alpha Reductase Inhibitors Passed - 10/02/2019  8:57 AM      Passed - Valid encounter within last 12 months    Recent Outpatient Visits          2 months ago Medicare annual wellness visit, subsequent   [facility], Lilia Argue, MD   4 months ago Loss of weight   [facility], Lilia Argue, MD   5 months ago Loss of weight   [facility], Lilia Argue, MD   10 months ago Lipoma of torso   [facility] at [facility], Arlie Solomons, MD   1 year ago Medicare annual wellness visit, subsequent   [facility], [name], MD      Future Appointments            In 9 months Rutherford Guys, MD [facility] at [place], [facility]           . doxazosin (CARDURA) 2 MG tablet [Pharmacy Med Name: DOXAZOSIN MESYLATE 2 MG TAB] 90 tablet 3    Sig: TAKE 1 TABLET ONCE DAILY.     Cardiovascular:  Alpha Blockers Passed - 10/02/2019  8:57 AM      Passed - Last BP in normal range    BP Readings from Last 1 Encounters:  07/13/19 134/64         Passed - Valid encounter within last 6 months    Recent Outpatient Visits          2 months ago Medicare annual wellness visit, subsequent   [facility], Lilia Argue, MD   4 months ago Loss of weight   [facility], Lilia Argue, MD   5 months ago Loss of weight   [facility], Lilia Argue, MD   10 months ago Lipoma of torso   [facility] at [facility], Arlie Solomons, MD   1 year ago Medicare annual wellness visit, subsequent   [facility], [name], MD      Future Appointments            In 9 months Rutherford Guys, MD Primary Care at [place], [facility]

## 2019-10-18 DIAGNOSIS — M62838 Other muscle spasm: Secondary | ICD-10-CM | POA: Diagnosis not present

## 2019-10-18 DIAGNOSIS — R350 Frequency of micturition: Secondary | ICD-10-CM | POA: Diagnosis not present

## 2019-10-18 DIAGNOSIS — M6281 Muscle weakness (generalized): Secondary | ICD-10-CM | POA: Diagnosis not present

## 2019-10-18 DIAGNOSIS — R3915 Urgency of urination: Secondary | ICD-10-CM | POA: Diagnosis not present

## 2019-10-18 DIAGNOSIS — M6289 Other specified disorders of muscle: Secondary | ICD-10-CM | POA: Diagnosis not present

## 2020-01-05 ENCOUNTER — Other Ambulatory Visit: Payer: Self-pay | Admitting: Cardiology

## 2020-01-17 ENCOUNTER — Telehealth: Payer: Self-pay

## 2020-01-17 NOTE — Telephone Encounter (Signed)
Anthony Juarez is a 74 y.o. male calling to request a 90 day supply of his medication.  LamoTRIgine 200 MG TB24 24 hour tablet.

## 2020-01-17 NOTE — Telephone Encounter (Signed)
Spoke to pt Informed him that med was originally sent as a 27 day and pharmacy can change it back from 30-90 Pt voiced understanding

## 2020-04-04 ENCOUNTER — Other Ambulatory Visit: Payer: Self-pay | Admitting: Cardiology

## 2020-04-22 ENCOUNTER — Telehealth: Payer: Self-pay | Admitting: Registered Nurse

## 2020-04-22 DIAGNOSIS — Z0000 Encounter for general adult medical examination without abnormal findings: Secondary | ICD-10-CM

## 2020-04-22 DIAGNOSIS — R7301 Impaired fasting glucose: Secondary | ICD-10-CM

## 2020-04-22 DIAGNOSIS — E782 Mixed hyperlipidemia: Secondary | ICD-10-CM

## 2020-04-22 DIAGNOSIS — R1013 Epigastric pain: Secondary | ICD-10-CM

## 2020-04-22 NOTE — Telephone Encounter (Signed)
Please order fasting labs for COE scheduled for 01/04/201

## 2020-04-22 NOTE — Telephone Encounter (Signed)
error 

## 2020-04-25 ENCOUNTER — Other Ambulatory Visit: Payer: Self-pay

## 2020-04-25 ENCOUNTER — Ambulatory Visit (INDEPENDENT_AMBULATORY_CARE_PROVIDER_SITE_OTHER): Payer: Medicare Other | Admitting: Family Medicine

## 2020-04-25 DIAGNOSIS — Z23 Encounter for immunization: Secondary | ICD-10-CM | POA: Diagnosis not present

## 2020-04-27 ENCOUNTER — Ambulatory Visit: Payer: Medicare Other | Attending: Internal Medicine

## 2020-04-27 DIAGNOSIS — Z23 Encounter for immunization: Secondary | ICD-10-CM

## 2020-04-27 NOTE — Progress Notes (Signed)
   Covid-19 Vaccination Clinic  Name:  Mallie Linnemann    MRN: [id number] DOB: 05-06-1946  04/27/2020  Mr. Muckleroy was observed post Covid-19 immunization for 15 minutes without incident. He was provided with Vaccine Information Sheet and instruction to access the V-Safe system.   Mr. Kumari was instructed to call 911 with any severe reactions post vaccine: Marland Kitchen Difficulty breathing  . Swelling of face and throat  . A fast heartbeat  . A bad rash all over body  . Dizziness and weakness

## 2020-04-30 ENCOUNTER — Encounter: Payer: Self-pay | Admitting: Cardiology

## 2020-04-30 ENCOUNTER — Other Ambulatory Visit: Payer: Self-pay

## 2020-04-30 ENCOUNTER — Ambulatory Visit (INDEPENDENT_AMBULATORY_CARE_PROVIDER_SITE_OTHER): Payer: Medicare Other | Admitting: Cardiology

## 2020-04-30 VITALS — BP 140/70 | HR 55 | Ht 69.0 in | Wt 160.8 lb

## 2020-04-30 DIAGNOSIS — R001 Bradycardia, unspecified: Secondary | ICD-10-CM

## 2020-04-30 DIAGNOSIS — E785 Hyperlipidemia, unspecified: Secondary | ICD-10-CM

## 2020-04-30 DIAGNOSIS — Z8249 Family history of ischemic heart disease and other diseases of the circulatory system: Secondary | ICD-10-CM

## 2020-04-30 MED ORDER — ROSUVASTATIN CALCIUM 20 MG PO TABS: 20.0000 mg | ORAL_TABLET | Freq: Every day | ORAL | 3 refills | Status: DC

## 2020-04-30 NOTE — Progress Notes (Signed)
Cardiology Office Note:    Date:  04/30/2020   ID:  Anthony Juarez, DOB 09-26-1945, MRN [id number]  PCP:  Anthony Agreste, MD  [facility] Cardiologist:  Anthony Furbish, MD  [facility] Electrophysiologist:  None   Referring MD: Anthony Guys, MD     History of Present Illness:    Anthony Juarez is a 74 y.o. male here for the follow-up of bradycardia.  Prior ER visit in 2020 with chest pain left-sided pressure when standing up in the afternoon relieved with rest.  His father had CABG at age 50.  He had been diagnosed with GERD in the past  16 miles a week hiking. 750 mile goal.  Enjoys the trails here in town.  Denies any chest pain fevers chills nausea vomiting syncope bleeding.  Prior LDL between 75 and 80.  On high intensity Crestor.   Past Medical History:  Diagnosis Date  . Allergy   . Arthritis   . Asthma    childhood  . AVM (arteriovenous malformation) brain 06/15/2013  . Balanitis   . Benign prostatic hypertrophy   . Central sleep apnea   . Cholecystitis with cholelithiasis   . Chronic sinus bradycardia 07/09/2018  . Convulsive disorder ([place])   . Esophageal reflux 03/28/2015  . History of nuclear stress test    Myoview 11/16: EF 60%, normal perfusion, Low Risk  . Hyperlipidemia   . Prostatitis   . Seizures ([place])     Past Surgical History:  Procedure Laterality Date  . ELBOW SURGERY  11/11/2000   repair  . FRACTURE SURGERY     LEFT ELBOW  . TONSILLECTOMY      Current Medications: Current Meds  Medication Sig  . albuterol (PROVENTIL HFA;VENTOLIN HFA) 108 (90 Base) MCG/ACT inhaler Inhale 2 puffs into the lungs every 4 (four) hours as needed for wheezing or shortness of breath (cough, shortness of breath or wheezing.).  Marland Kitchen aspirin EC 81 MG tablet Take 81 mg by mouth 2 (two) times a week. Tues and Sat  . Coenzyme Q10 (COQ10) 100 MG CAPS Take 100 mg by mouth daily.  Marland Kitchen doxazosin (CARDURA) 2 MG tablet TAKE 1 TABLET ONCE DAILY.  . finasteride  (PROSCAR) 5 MG tablet TAKE 1 TABLET ONCE DAILY.  . fluticasone (FLONASE) 50 MCG/ACT nasal spray Place 1 spray into both nostrils 2 (two) times daily.  . LamoTRIgine 200 MG TB24 24 hour tablet Take 1 tablet (200 mg total) by mouth at bedtime.  . montelukast (SINGULAIR) 10 MG tablet Take 1 tablet (10 mg total) by mouth at bedtime.  . Multiple Vitamins-Minerals (MULTIVITAMIN WITH MINERALS) tablet Take 1 tablet by mouth daily.  . NON FORMULARY Take by mouth at bedtime. Calcium 600 mg, Magnesium 300 mg and Vitamin D3  . rosuvastatin (CRESTOR) 20 MG tablet Take 1 tablet (20 mg total) by mouth daily.  Marland Kitchen Spacer/Aero-Holding Chambers DEVI 1 Device by Does not apply route every 4 (four) hours as needed.  . [DISCONTINUED] rosuvastatin (CRESTOR) 20 MG tablet Take 1 tablet (20 mg total) by mouth daily. Please make overdue appt with Dr. Marlou Porch before anymore refills. 1st attempt     Allergies:   Penicillins   Social History   Socioeconomic History  . Marital status: Married    Spouse name: [name]  . Number of children: Not on file  . Years of education: Not on file  . Highest education level: Bachelor's degree (e.g., BA, AB, BS)  Occupational History  . Occupation: retired  Employer: RETIRED  Tobacco Use  . Smoking status: Former Smoker    Packs/day: 1.00    Years: 10.00    Pack years: 10.00    Quit date: 07/13/1977    Years since quitting: 42.8  . Smokeless tobacco: Never Used  Vaping Use  . Vaping Use: Never used  Substance and Sexual Activity  . Alcohol use: Yes    Alcohol/week: 2.0 standard drinks    Types: 2 Cans of beer per week    Comment: 2 beers a day  . Drug use: No  . Sexual activity: Not Currently    Birth control/protection: Post-menopausal, Abstinence  Other Topics Concern  . Not on file  Social History Narrative   Patient worked in [employer].   Social Determinants of Health   Financial Resource Strain:   . Difficulty of Paying Living Expenses: Not on file    Food Insecurity:   . Worried About Charity fundraiser in the Last Year: Not on file  . Ran Out of Food in the Last Year: Not on file  Transportation Needs:   . Lack of Transportation (Medical): Not on file  . Lack of Transportation (Non-Medical): Not on file  Physical Activity:   . Days of Exercise per Week: Not on file  . Minutes of Exercise per Session: Not on file  Stress:   . Feeling of Stress : Not on file  Social Connections:   . Frequency of Communication with Friends and Family: Not on file  . Frequency of Social Gatherings with Friends and Family: Not on file  . Attends Religious Services: Not on file  . Active Member of Clubs or Organizations: Not on file  . Attends Archivist Meetings: Not on file  . Marital Status: Not on file     Family History: The patient's family history includes Cancer in his father; Dementia in his mother; Heart disease in his father and maternal grandfather.  ROS:   Please see the history of present illness.     All other systems reviewed and are negative.  EKGs/Labs/Other Studies Reviewed:    EKG:  EKG is  ordered today.  The ekg ordered today demonstrates sinus bradycardia 55 with PACs  Recent Labs: 07/13/2019: ALT 10; BUN 15; Creatinine, Ser 1.02; Potassium 4.3; Sodium 136  Recent Lipid Panel    Component Value Date/Time   CHOL 192 07/13/2019 0855   TRIG 47 07/13/2019 0855   HDL 103 07/13/2019 0855   CHOLHDL 1.9 07/13/2019 0855   CHOLHDL 2.7 05/14/2016 0900   VLDL 18 05/14/2016 0900   LDLCALC 80 07/13/2019 0855      Physical Exam:    VS:  BP 140/70   Pulse (!) 55   Ht 5\' 9"  (1.753 m)   Wt 160 lb 12.8 oz (72.9 kg)   SpO2 96%   BMI 23.75 kg/m     Wt Readings from Last 3 Encounters:  04/30/20 160 lb 12.8 oz (72.9 kg)  07/13/19 164 lb (74.4 kg)  06/27/19 162 lb 12.8 oz (73.8 kg)     GEN:  Well nourished, well developed in no acute distress HEENT: Normal NECK: No JVD; No carotid bruits LYMPHATICS: No  lymphadenopathy CARDIAC: RRR, no murmurs, rubs, gallops RESPIRATORY:  Clear to auscultation without rales, wheezing or rhonchi  ABDOMEN: Soft, non-tender, non-distended MUSCULOSKELETAL:  No edema; No deformity  SKIN: Warm and dry NEUROLOGIC:  Alert and oriented x 3 PSYCHIATRIC:  Normal affect   ASSESSMENT:    1.  Family history of cardiovascular disease   2. Chronic sinus bradycardia   3. Hyperlipidemia LDL goal <70    PLAN:    In order of problems listed above:  Bradycardia -Sinus bradycardia 55 with PACs noted on EKG.  Stable without any high risk symptoms such as syncope.  Family history of CAD -Father had bypass surgery at age 44.  Continue with secondary risk factor prevention. -He had a calcium score in 2020 that was 62nd percentile at 152.  Hyperlipidemia -Continue with Crestor 20 mg a day.  Diet and exercise.  Hiking frequently.  Prior LDL 80.  Optimal 70.  No myalgias.  Continue with current medical management.   Medication Adjustments/Labs and Tests Ordered: Current medicines are reviewed at length with the patient today.  Concerns regarding medicines are outlined above.  Orders Placed This Encounter  Procedures  . EKG 12-Lead   Meds ordered this encounter  Medications  . rosuvastatin (CRESTOR) 20 MG tablet    Sig: Take 1 tablet (20 mg total) by mouth daily.    Dispense:  90 tablet    Refill:  3    Patient Instructions  Medication Instructions:  The current medical regimen is effective;  continue present plan and medications.  *If you need a refill on your cardiac medications before your next appointment, please call your pharmacy*  Follow-Up: At [facility], you and your health needs are our priority.  As part of our continuing mission to provide you with exceptional heart care, we have created designated Provider Care Teams.  These Care Teams include your primary Cardiologist (physician) and Advanced Practice Providers (APPs -  Physician Assistants and  Nurse Practitioners) who all work together to provide you with the care you need, when you need it.  We recommend signing up for the patient portal called "MyChart".  Sign up information is provided on this After Visit Summary.  MyChart is used to connect with patients for Virtual Visits (Telemedicine).  Patients are able to view lab/test results, encounter notes, upcoming appointments, etc.  Non-urgent messages can be sent to your provider as well.   To learn more about what you can do with MyChart, go to [email or web address].    Your next appointment:   12 month(s)  The format for your next appointment:   In Person  Provider:   Candee Furbish, MD   Thank you for choosing [facility]!!         Signed, Anthony Furbish, MD  04/30/2020 3:19 PM    [place]

## 2020-04-30 NOTE — Patient Instructions (Signed)
Medication Instructions:  The current medical regimen is effective;  continue present plan and medications.  *If you need a refill on your cardiac medications before your next appointment, please call your pharmacy*  Follow-Up: At [facility], you and your health needs are our priority.  As part of our continuing mission to provide you with exceptional heart care, we have created designated Provider Care Teams.  These Care Teams include your primary Cardiologist (physician) and Advanced Practice Providers (APPs -  Physician Assistants and Nurse Practitioners) who all work together to provide you with the care you need, when you need it.  We recommend signing up for the patient portal called "MyChart".  Sign up information is provided on this After Visit Summary.  MyChart is used to connect with patients for Virtual Visits (Telemedicine).  Patients are able to view lab/test results, encounter notes, upcoming appointments, etc.  Non-urgent messages can be sent to your provider as well.   To learn more about what you can do with MyChart, go to [email or web address].    Your next appointment:   12 month(s)  The format for your next appointment:   In Person  Provider:   Mark Skains, MD   Thank you for choosing [facility]!!      

## 2020-05-10 ENCOUNTER — Ambulatory Visit: Payer: Medicare Other | Admitting: Registered Nurse

## 2020-05-15 ENCOUNTER — Ambulatory Visit (INDEPENDENT_AMBULATORY_CARE_PROVIDER_SITE_OTHER): Payer: Medicare Other | Admitting: Registered Nurse

## 2020-05-15 ENCOUNTER — Other Ambulatory Visit: Payer: Self-pay

## 2020-05-15 ENCOUNTER — Encounter: Payer: Self-pay | Admitting: Registered Nurse

## 2020-05-15 VITALS — BP 123/71 | HR 77 | Temp 98.0°F | Resp 18 | Ht 69.0 in | Wt 160.2 lb

## 2020-05-15 DIAGNOSIS — H9193 Unspecified hearing loss, bilateral: Secondary | ICD-10-CM

## 2020-05-15 NOTE — Progress Notes (Signed)
Established Patient Office Visit  Subjective:  Patient ID: Anthony Juarez, male    DOB: 08/16/1945  Age: 74 y.o. MRN: [id number]  CC:  Chief Complaint  Patient presents with   Referral    Patient states he feels like he is losing his hearing and wants to know if he could get a referral to [facility] hearing     HPI Anthony Juarez presents for hearing loss Ongoing slowly for years No loud noise exposure, no injury to ear Occasional very short pain in L ear No drainage Has not had cerumen impaction No other neuro symptoms R hears better than L but pt unsure of any true lateralization Had appt with [facility] audiology in March 2020 that got canceled, has not yet rescheduled  No other concerns.  Past Medical History:  Diagnosis Date   Allergy    Arthritis    Asthma    childhood   AVM (arteriovenous malformation) brain 06/15/2013   Balanitis    Benign prostatic hypertrophy    Central sleep apnea    Cholecystitis with cholelithiasis    Chronic sinus bradycardia 07/09/2018   Convulsive disorder ([place])    Esophageal reflux 03/28/2015   History of nuclear stress test    Myoview 11/16: EF 60%, normal perfusion, Low Risk   Hyperlipidemia    Prostatitis    Seizures ([place])     Past Surgical History:  Procedure Laterality Date   ELBOW SURGERY  11/11/2000   repair   FRACTURE SURGERY     LEFT ELBOW   TONSILLECTOMY      Family History  Problem Relation Age of Onset   Heart disease Father        cabg 32's   Cancer Father        Prostate   Heart disease Maternal Grandfather        Late 10's   Dementia Mother     Social History   Socioeconomic History   Marital status: Married    Spouse name: [name]   Number of children: Not on file   Years of education: Not on file   Highest education level: Bachelor's degree (e.g., BA, AB, BS)  Occupational History   Occupation: retired    Fish farm manager: RETIRED  Tobacco Use   Smoking status: Former Smoker     Packs/day: 1.00    Years: 10.00    Pack years: 10.00    Quit date: 07/13/1977    Years since quitting: 42.8   Smokeless tobacco: Never Used  Vaping Use   Vaping Use: Never used  Substance and Sexual Activity   Alcohol use: Yes    Alcohol/week: 2.0 standard drinks    Types: 2 Cans of beer per week    Comment: 2 beers a day   Drug use: No   Sexual activity: Not Currently    Birth control/protection: Post-menopausal, Abstinence  Other Topics Concern   Not on file  Social History Narrative   Patient worked in [employer].   Social Determinants of Health   Financial Resource Strain:    Difficulty of Paying Living Expenses: Not on file  Food Insecurity:    Worried About Charity fundraiser in the Last Year: Not on file   [employer] of Food in the Last Year: Not on file  Transportation Needs:    Lack of Transportation (Medical): Not on file   Lack of Transportation (Non-Medical): Not on file  Physical Activity:    Days of Exercise per Week: Not on file  Minutes of Exercise per Session: Not on file  Stress:    Feeling of Stress : Not on file  Social Connections:    Frequency of Communication with Friends and Family: Not on file   Frequency of Social Gatherings with Friends and Family: Not on file   Attends Religious Services: Not on file   Active Member of Clubs or Organizations: Not on file   Attends Archivist Meetings: Not on file   Marital Status: Not on file  Intimate Partner Violence:    Fear of Current or Ex-Partner: Not on file   Emotionally Abused: Not on file   Physically Abused: Not on file   Sexually Abused: Not on file    Outpatient Medications Prior to Visit  Medication Sig Dispense Refill   albuterol (PROVENTIL HFA;VENTOLIN HFA) 108 (90 Base) MCG/ACT inhaler Inhale 2 puffs into the lungs every 4 (four) hours as needed for wheezing or shortness of breath (cough, shortness of breath or wheezing.). 1 Inhaler 2   aspirin  EC 81 MG tablet Take 81 mg by mouth 2 (two) times a week. Tues and Sat     Coenzyme Q10 (COQ10) 100 MG CAPS Take 100 mg by mouth daily.     doxazosin (CARDURA) 2 MG tablet TAKE 1 TABLET ONCE DAILY. 90 tablet 3   finasteride (PROSCAR) 5 MG tablet TAKE 1 TABLET ONCE DAILY. 90 tablet 3   fluticasone (FLONASE) 50 MCG/ACT nasal spray Place 1 spray into both nostrils 2 (two) times daily. 16 g 6   LamoTRIgine 200 MG TB24 24 hour tablet Take 1 tablet (200 mg total) by mouth at bedtime. 90 tablet 4   montelukast (SINGULAIR) 10 MG tablet Take 1 tablet (10 mg total) by mouth at bedtime. 90 tablet 3   Multiple Vitamins-Minerals (MULTIVITAMIN WITH MINERALS) tablet Take 1 tablet by mouth daily.     NON FORMULARY Take by mouth at bedtime. Calcium 600 mg, Magnesium 300 mg and Vitamin D3     rosuvastatin (CRESTOR) 20 MG tablet Take 1 tablet (20 mg total) by mouth daily. 90 tablet 3   Spacer/Aero-Holding Chambers DEVI 1 Device by Does not apply route every 4 (four) hours as needed. 1 each 2   No facility-administered medications prior to visit.    Allergies  Allergen Reactions   Penicillins Rash    Did it involve swelling of the face/tongue/throat, SOB, or low BP? No Did it involve sudden or severe rash/hives, skin peeling, or any reaction on the inside of your mouth or nose? Yes Did you need to seek medical attention at a hospital or doctor's office? No When did it last happen?Decades ago. If all above answers are NO, may proceed with cephalosporin use.     ROS Review of Systems Per hpi     Objective:    Physical Exam Constitutional:      General: He is not in acute distress.    Appearance: Normal appearance. He is normal weight. He is not ill-appearing, toxic-appearing or diaphoretic.  HENT:     Right Ear: Tympanic membrane, ear canal and external ear normal. There is no impacted cerumen.     Left Ear: Tympanic membrane, ear canal and external ear normal. There is no impacted  cerumen.     Ears:     Weber exam findings: does not lateralize.    Right Rinne: AC > BC.    Left Rinne: AC > BC. Cardiovascular:     Rate and Rhythm: Normal rate and regular  rhythm.     Heart sounds: Normal heart sounds. No murmur heard.  No friction rub. No gallop.   Pulmonary:     Effort: Pulmonary effort is normal. No respiratory distress.     Breath sounds: Normal breath sounds. No stridor. No wheezing, rhonchi or rales.  Chest:     Chest wall: No tenderness.  Neurological:     General: No focal deficit present.     Mental Status: He is alert and oriented to person, place, and time. Mental status is at baseline.  Psychiatric:        Mood and Affect: Mood normal.        Behavior: Behavior normal.        Thought Content: Thought content normal.        Judgment: Judgment normal.     BP 123/71    Pulse 77    Temp 98 F (36.7 C) (Temporal)    Resp 18    Ht 5\' 9"  (1.753 m)    Wt 160 lb 3.2 oz (72.7 kg)    SpO2 98%    BMI 23.66 kg/m  Wt Readings from Last 3 Encounters:  05/15/20 160 lb 3.2 oz (72.7 kg)  04/30/20 160 lb 12.8 oz (72.9 kg)  07/13/19 164 lb (74.4 kg)     There are no preventive care reminders to display for this patient.  There are no preventive care reminders to display for this patient.  Lab Results  Component Value Date   TSH 2.350 04/20/2019   Lab Results  Component Value Date   WBC 7.5 04/20/2019   HGB 14.1 04/20/2019   HCT 41.5 04/20/2019   MCV 90 04/20/2019   PLT 272 04/20/2019   Lab Results  Component Value Date   NA 136 07/13/2019   K 4.3 07/13/2019   CO2 23 07/13/2019   GLUCOSE 97 07/13/2019   BUN 15 07/13/2019   CREATININE 1.02 07/13/2019   BILITOT 0.7 07/13/2019   ALKPHOS 57 07/13/2019   AST 27 07/13/2019   ALT 10 07/13/2019   PROT 6.0 07/13/2019   ALBUMIN 4.2 07/13/2019   CALCIUM 9.3 07/13/2019   ANIONGAP 7 11/11/2018   Lab Results  Component Value Date   CHOL 192 07/13/2019   Lab Results  Component Value Date   HDL 103  07/13/2019   Lab Results  Component Value Date   LDLCALC 80 07/13/2019   Lab Results  Component Value Date   TRIG 47 07/13/2019   Lab Results  Component Value Date   CHOLHDL 1.9 07/13/2019   No results found for: HGBA1C    Assessment & Plan:   Problem List Items Addressed This Visit    None    Visit Diagnoses    Bilateral hearing loss, unspecified hearing loss type    -  Primary   Relevant Orders   Ambulatory referral to Audiology      No orders of the defined types were placed in this encounter.   Follow-up: No follow-ups on file.   PLAN  No acute findings today  Refer to audiology  Follow up as needed after  Patient encouraged to call clinic with any questions, comments, or concerns.  Maximiano Coss, NP

## 2020-05-15 NOTE — Patient Instructions (Signed)
° ° ° °  If you have lab work done today you will be contacted with your lab results within the next 2 weeks.  If you have not heard from us then please contact us. The fastest way to get your results is to register for My Chart. ° ° °IF you received an x-ray today, you will receive an invoice from [facility]. Please contact  Radiology at [phone] with questions or concerns regarding your invoice.  ° °IF you received labwork today, you will receive an invoice from [facility]. Please contact [facility] at [phone] with questions or concerns regarding your invoice.  ° °Our billing staff will not be able to assist you with questions regarding bills from these companies. ° °You will be contacted with the lab results as soon as they are available. The fastest way to get your results is to activate your My Chart account. Instructions are located on the last page of this paperwork. If you have not heard from us regarding the results in 2 weeks, please contact this office. °  ° ° ° °

## 2020-06-12 DIAGNOSIS — H2513 Age-related nuclear cataract, bilateral: Secondary | ICD-10-CM | POA: Diagnosis not present

## 2020-06-27 ENCOUNTER — Ambulatory Visit: Payer: Medicare Other | Admitting: Neurology

## 2020-06-27 ENCOUNTER — Ambulatory Visit (INDEPENDENT_AMBULATORY_CARE_PROVIDER_SITE_OTHER): Payer: Medicare Other | Admitting: Neurology

## 2020-06-27 ENCOUNTER — Encounter: Payer: Self-pay | Admitting: Neurology

## 2020-06-27 VITALS — BP 136/79 | HR 73 | Ht 69.0 in | Wt 162.2 lb

## 2020-06-27 DIAGNOSIS — R413 Other amnesia: Secondary | ICD-10-CM | POA: Diagnosis not present

## 2020-06-27 DIAGNOSIS — G40909 Epilepsy, unspecified, not intractable, without status epilepticus: Secondary | ICD-10-CM | POA: Diagnosis not present

## 2020-06-27 DIAGNOSIS — G3184 Mild cognitive impairment, so stated: Secondary | ICD-10-CM | POA: Insufficient documentation

## 2020-06-27 MED ORDER — LAMOTRIGINE ER 200 MG PO TB24: 200.0000 mg | ORAL_TABLET | Freq: Every day | ORAL | 4 refills | Status: DC

## 2020-06-27 NOTE — Progress Notes (Signed)
HISTORY OF PRESENT ILLNESS:  Anthony Juarez is a 74 yo WM, following up for seizure disorder  He has PMHx of seizure since infant, he was treated with dilantin and phenobarbital for a long time, was eventurally tapered off after seizure free since age 75, he had one recurrent seizure at age 58, was put back on dilantin 400mg  qhs, has been on it since.  He had Bachelor's degree, retired as Location manager, Insurance underwriter  in 2009, moved to [place] about 2.5 years ago, now he stays at home most of time, rarely initiated activities, felt fatigue, difficulty concontrating, getting worse since summer of 2013.  His wife complains that he is not up to his house projects anymore, frequent nocturia, which has prompted recent treatment with desmopression, which helped his frequent night time awaking,  He also has sleep apnea, but could not tolerate his BiPAP machine.He is seen by Dr. Gwenette Greet.  Dilantin level was 32 while he was taking Dilantin 100 mg 4 tablets each night, level decreased to 22 while he was taking Dilantin 100 mg 3 tablets each night, he has developed gingival hypertrophy, his brain fogginess has much improved with decreased dose of Dilantin, especially after he stopped taking Desopressin,   MRI scan of the brain showed a cavernous angioma with remote age hemorrhage in the right frontal subcortical region with  and adjacent  venous angioma.  There are moderate changes of chronic microvascular ischemia and mild degree of generalized cerebral atrophy.  Followup visit today patient has been switched to Keppra XR 750mg  daily. He is off Dilantin totally and his brain fogginess has improved. He has not had further seizure activity.  UPDATE Jan 19th 2015: He is now taking keppra xr 750mg  qhs, last seizure 1992, nocturnal seizure,  he only has mild fatigue, no longer having brain foggy sensation We have reviewed MRI taking January 2015, continue the right inferior frontal cavernous  venous angioma, mild atrophy, moderate small vessel disease,  UPDATE Jan 19th 2016; I have reviewed MRI brain (with and without): right frontal juxtacortical T2 heterogenous lesion (1.4x1.0cm), with "popcorn" appearance, consistent with cavernous malformation. There is an associated small developmental venous anomaly.  Multiple round and ovoid, periventricular, subcortical and juxtacortical T2 hyperintense foci. These findings are non-specific and considerations include autoimmune, inflammatory, post-infectious, microvascular ischemic or migraine associated etiologies.   He is taking Keppra XR 750 mg every night, there was no recurrent seizure  UPDATE Aug 01 2015: He had sleep study recently,  No need for CPAP machine, he is taking keppra xr 750mg  qhs, no recurrent seizure.   We have reviewed MRI of the brain with and without contrast in January 2017: 1. A focus of heterogenous signal in the superficial right frontal lobe with a hemosiderin rim and adjacent venous anomaly. This has characteristics of a cavernous malformation. Compared to the 07/20/2013 MRI, there has been no definite change. 2. Scattered T2/FLAIR hyperintense foci, predominantly in the subcortical deep white matter of both hemispheres. This is stable compared to the prior MRI. She is a nonspecific finding and potential etiology could be chronic microvascular ischemic change, migraine, chronic demyelination.   UPDATE Jun 21 2018: He is overall doing well, taking Keppra 750 mg tablets half tablets twice a day, does complain some moodiness, discussed with patient we decided to switch him to lamotrigine 200 mg every day  UPDATE Jun 27 2020: He is overall doing very well, has no recurrent seizure, tolerating lamotrigine ER 200 mg well, complains of  difficulty sleeping occasionally, especially after using bathroom at nighttime, he hiked with his wife regularly,  Recent couple years, noticed mild memory loss, word finding  difficulties, MoCA examination 29/30 today,  We again personally reviewed MRI of brain with without contrast in 2017: Focus of heterogeneous signal at the superficial right frontal lobe, with hemosiderin rim and adjacent venous anomaly, characteristic of a cavernous malformation,     REVIEW OF SYSTEMS: Out of a complete 14 system review of symptoms, the patient complains only of the following symptoms, and all other reviewed systems are negative.  Seizures  ALLERGIES: Allergies  Allergen Reactions  . Penicillins Rash    Did it involve swelling of the face/tongue/throat, SOB, or low BP? No Did it involve sudden or severe rash/hives, skin peeling, or any reaction on the inside of your mouth or nose? Yes Did you need to seek medical attention at a hospital or doctor's office? No When did it last happen?Decades ago. If all above answers are "NO", may proceed with cephalosporin use.     HOME MEDICATIONS: Outpatient Medications Prior to Visit  Medication Sig Dispense Refill  . albuterol (PROVENTIL HFA;VENTOLIN HFA) 108 (90 Base) MCG/ACT inhaler Inhale 2 puffs into the lungs every 4 (four) hours as needed for wheezing or shortness of breath (cough, shortness of breath or wheezing.). 1 Inhaler 2  . aspirin EC 81 MG tablet Take 81 mg by mouth 2 (two) times a week. Tues and Sat    . Coenzyme Q10 (COQ10) 100 MG CAPS Take 100 mg by mouth daily.    Marland Kitchen doxazosin (CARDURA) 2 MG tablet TAKE 1 TABLET ONCE DAILY. 90 tablet 3  . finasteride (PROSCAR) 5 MG tablet TAKE 1 TABLET ONCE DAILY. 90 tablet 3  . fluticasone (FLONASE) 50 MCG/ACT nasal spray Place 1 spray into both nostrils 2 (two) times daily. 16 g 6  . LamoTRIgine 200 MG TB24 24 hour tablet Take 1 tablet (200 mg total) by mouth at bedtime. 90 tablet 4  . montelukast (SINGULAIR) 10 MG tablet Take 1 tablet (10 mg total) by mouth at bedtime. 90 tablet 3  . Multiple Vitamins-Minerals (MULTIVITAMIN WITH MINERALS) tablet Take 1 tablet by mouth daily.     . NON FORMULARY Take by mouth at bedtime. Calcium 600 mg, Magnesium 300 mg and Vitamin D3    . rosuvastatin (CRESTOR) 20 MG tablet Take 1 tablet (20 mg total) by mouth daily. 90 tablet 3  . Spacer/Aero-Holding Chambers DEVI 1 Device by Does not apply route every 4 (four) hours as needed. 1 each 2   No facility-administered medications prior to visit.    PAST MEDICAL HISTORY: Past Medical History:  Diagnosis Date  . Allergy   . Arthritis   . Asthma    childhood  . AVM (arteriovenous malformation) brain 06/15/2013  . Balanitis   . Benign prostatic hypertrophy   . Central sleep apnea   . Cholecystitis with cholelithiasis   . Chronic sinus bradycardia 07/09/2018  . Convulsive disorder ([place])   . Esophageal reflux 03/28/2015  . History of nuclear stress test    Myoview 11/16: EF 60%, normal perfusion, Low Risk  . Hyperlipidemia   . Prostatitis   . Seizures ([place])     PAST SURGICAL HISTORY: Past Surgical History:  Procedure Laterality Date  . ELBOW SURGERY  11/11/2000   repair  . FRACTURE SURGERY     LEFT ELBOW  . TONSILLECTOMY      FAMILY HISTORY: Family History  Problem Relation Age of Onset  .  Heart disease Father        cabg 36's  . Cancer Father        Prostate  . Heart disease Maternal Grandfather        Late 86's  . Dementia Mother     SOCIAL HISTORY: Social History   Socioeconomic History  . Marital status: Married    Spouse name: Anthony Juarez  . Number of children: Not on file  . Years of education: Not on file  . Highest education level: Bachelor's degree (e.g., BA, AB, BS)  Occupational History  . Occupation: retired    Fish farm manager: RETIRED  Tobacco Use  . Smoking status: Former Smoker    Packs/day: 1.00    Years: 10.00    Pack years: 10.00    Quit date: 07/13/1977    Years since quitting: 42.9  . Smokeless tobacco: Never Used  Vaping Use  . Vaping Use: Never used  Substance and Sexual Activity  . Alcohol use: Yes    Alcohol/week: 2.0 standard drinks     Types: 2 Cans of beer per week    Comment: 2 beers a day  . Drug use: No  . Sexual activity: Not Currently    Birth control/protection: Post-menopausal, Abstinence  Other Topics Concern  . Not on file  Social History Narrative   Patient worked in [employer].   Social Determinants of Health   Financial Resource Strain: Not on file  Food Insecurity: Not on file  Transportation Needs: Not on file  Physical Activity: Not on file  Stress: Not on file  Social Connections: Not on file  Intimate Partner Violence: Not on file   PHYSICAL EXAM  Vitals:   06/27/20 1411  BP: 136/79  Pulse: 73  Weight: 162 lb 3.2 oz (73.6 kg)  Height: 5\' 9"  (1.753 m)   Body mass index is 23.95 kg/m.  Generalized: Well developed, in no acute distress   Neurological examination  Montreal Cognitive Assessment  06/27/2020  Visuospatial/ Executive (0/5) 5  Naming (0/3) 3  Attention: Read list of digits (0/2) 2  Attention: Read list of letters (0/1) 1  Attention: Serial 7 subtraction starting at 100 (0/3) 3  Language: Repeat phrase (0/2) 2  Language : Fluency (0/1) 1  Abstraction (0/2) 2  Delayed Recall (0/5) 4  Orientation (0/6) 6  Total 29   Cranial nerve II-XII: Pupils were equal round reactive to light. Extraocular movements were full, visual field were full on confrontational test. Facial sensation and strength were normal.  Head turning and shoulder shrug  were normal and symmetric. Motor: The motor testing reveals 5 over 5 strength of all 4 extremities. Good symmetric motor tone is noted throughout.  Sensory: Sensory testing is intact to soft touch on all 4 extremities. No evidence of extinction is noted.  Coordination: Cerebellar testing reveals good finger-nose-finger and heel-to-shin bilaterally.  Gait and station: Gait is normal. Tandem gait is normal.  Reflexes: Deep tendon reflexes are symmetric and normal bilaterally.   DIAGNOSTIC DATA (LABS, IMAGING, TESTING) - I  reviewed patient records, labs, notes, testing and imaging myself where available.  Lab Results  Component Value Date   WBC 7.5 04/20/2019   HGB 14.1 04/20/2019   HCT 41.5 04/20/2019   MCV 90 04/20/2019   PLT 272 04/20/2019      Component Value Date/Time   NA 136 07/13/2019 0855   K 4.3 07/13/2019 0855   CL 102 07/13/2019 0855   CO2 23 07/13/2019 0855   GLUCOSE 97  07/13/2019 0855   GLUCOSE 93 11/11/2018 2255   BUN 15 07/13/2019 0855   CREATININE 1.02 07/13/2019 0855   CREATININE 1.25 05/14/2016 0900   CALCIUM 9.3 07/13/2019 0855   PROT 6.0 07/13/2019 0855   ALBUMIN 4.2 07/13/2019 0855   AST 27 07/13/2019 0855   ALT 10 07/13/2019 0855   ALKPHOS 57 07/13/2019 0855   BILITOT 0.7 07/13/2019 0855   GFRNONAA 74 07/13/2019 0855   GFRAA 85 07/13/2019 0855   Lab Results  Component Value Date   CHOL 192 07/13/2019   HDL 103 07/13/2019   LDLCALC 80 07/13/2019   TRIG 47 07/13/2019   CHOLHDL 1.9 07/13/2019   No results found for: HGBA1C Lab Results  Component Value Date   VITAMINB12 815 11/12/2016   Lab Results  Component Value Date   TSH 2.350 04/20/2019      ASSESSMENT AND PLAN 74 y.o. year old male     Epilepsy  -No recurrent seizure  -Continue lamotrigine 200 mg ER daily  MRI of the brain showed superficial right frontal cavernous angioma Right frontal cavernous angioma, supratentorium small vessel disease  Mild cognitive impairment  MoCA examination 29 out of 30   Laboratory evaluation to rule out treatable etiology  Anthony Juarez, M.D. Ph.D.  [facility] [place], [place] [address] Phone: [phone] Fax:      [phone]

## 2020-07-08 ENCOUNTER — Encounter: Payer: Medicare Other | Admitting: Family Medicine

## 2020-07-11 ENCOUNTER — Other Ambulatory Visit: Payer: Self-pay

## 2020-07-11 ENCOUNTER — Ambulatory Visit (INDEPENDENT_AMBULATORY_CARE_PROVIDER_SITE_OTHER): Payer: Medicare Other | Admitting: Family Medicine

## 2020-07-11 ENCOUNTER — Telehealth: Payer: Self-pay | Admitting: Registered Nurse

## 2020-07-11 ENCOUNTER — Telehealth: Payer: Self-pay | Admitting: Family Medicine

## 2020-07-11 DIAGNOSIS — Z0000 Encounter for general adult medical examination without abnormal findings: Secondary | ICD-10-CM | POA: Diagnosis not present

## 2020-07-11 DIAGNOSIS — R7301 Impaired fasting glucose: Secondary | ICD-10-CM | POA: Diagnosis not present

## 2020-07-11 LAB — CBC WITH DIFFERENTIAL/PLATELET
Basophils Absolute: 0.1 10*3/uL (ref 0.0–0.2)
Basos: 1 %
EOS (ABSOLUTE): 0.1 10*3/uL (ref 0.0–0.4)
Eos: 2 %
Hematocrit: 45.2 % (ref 37.5–51.0)
Hemoglobin: 14.8 g/dL (ref 13.0–17.7)
Immature Grans (Abs): 0 10*3/uL (ref 0.0–0.1)
Immature Granulocytes: 0 %
Lymphocytes Absolute: 3 10*3/uL (ref 0.7–3.1)
Lymphs: 44 %
MCH: 30.4 pg (ref 26.6–33.0)
MCHC: 32.7 g/dL (ref 31.5–35.7)
MCV: 93 fL (ref 79–97)
Monocytes Absolute: 0.5 10*3/uL (ref 0.1–0.9)
Monocytes: 8 %
Neutrophils Absolute: 3 10*3/uL (ref 1.4–7.0)
Neutrophils: 45 %
Platelets: 227 10*3/uL (ref 150–450)
RBC: 4.87 x10E6/uL (ref 4.14–5.80)
RDW: 12.5 % (ref 11.6–15.4)
WBC: 6.7 10*3/uL (ref 3.4–10.8)

## 2020-07-11 LAB — COMPREHENSIVE METABOLIC PANEL
ALT: 12 IU/L (ref 0–44)
AST: 26 IU/L (ref 0–40)
Albumin/Globulin Ratio: 2.3 — ABNORMAL HIGH (ref 1.2–2.2)
Albumin: 4.2 g/dL (ref 3.7–4.7)
Alkaline Phosphatase: 62 IU/L (ref 44–121)
BUN/Creatinine Ratio: 15 (ref 10–24)
BUN: 15 mg/dL (ref 8–27)
Bilirubin Total: 0.5 mg/dL (ref 0.0–1.2)
CO2: 22 mmol/L (ref 20–29)
Calcium: 9.5 mg/dL (ref 8.6–10.2)
Chloride: 105 mmol/L (ref 96–106)
Creatinine, Ser: 0.97 mg/dL (ref 0.76–1.27)
GFR calc Af Amer: 90 mL/min/{1.73_m2} (ref >59)
GFR calc non Af Amer: 78 mL/min/{1.73_m2} (ref >59)
Globulin, Total: 1.8 g/dL (ref 1.5–4.5)
Glucose: 89 mg/dL (ref 65–99)
Potassium: 4.5 mmol/L (ref 3.5–5.2)
Sodium: 141 mmol/L (ref 134–144)
Total Protein: 6 g/dL (ref 6.0–8.5)

## 2020-07-11 LAB — HEMOGLOBIN A1C
Est. average glucose Bld gHb Est-mCnc: 114 mg/dL
Hgb A1c MFr Bld: 5.6 % (ref 4.8–5.6)

## 2020-07-11 MED ORDER — MONTELUKAST SODIUM 10 MG PO TABS: 10.0000 mg | ORAL_TABLET | Freq: Every day | ORAL | 3 refills | Status: DC

## 2020-07-11 NOTE — Telephone Encounter (Signed)
Pt still having trouble with referral to auditory please help if new order is needed please let me know

## 2020-07-11 NOTE — Telephone Encounter (Signed)
Patient is still having trouble with and auditory hearing referral put in from Janeece Agee / the office patient was referred to is telling patient information is in complete  /   Please reach out to patient with status

## 2020-07-11 NOTE — Telephone Encounter (Signed)
Pt is needing refill on  What is the name of the medication? montelukast (SINGULAIR) 10 MG tablet [[id number]]    Have you contacted your pharmacy to request a refill? y  Which pharmacy would you like this sent to? [facility] - [place], [place] - [address]  [address], [place] [address]  Phone:  [phone] Fax:  [phone]  DEA #:  --   Patient notified that their request is being sent to the clinical staff for review and that they should receive a call once it is complete. If they do not receive a call within 72 hours they can check with their pharmacy or our office.

## 2020-07-11 NOTE — Telephone Encounter (Signed)
Sent!

## 2020-07-12 ENCOUNTER — Encounter: Payer: Self-pay | Admitting: Registered Nurse

## 2020-07-15 NOTE — Telephone Encounter (Signed)
Pt asking about lab work for lipid and PSA,  pt had PSA 07/13/2019 that showed an increase from 0.5 to 1.0 lipid shows one active from 07/11/2020 but no results last set of results before then are 07/13/2019 Please advise

## 2020-07-15 NOTE — Telephone Encounter (Signed)
[facility] [employer] Marian Sorrow is calling and stated she is needing the referral to say   Needing: Pt name DOB Date of issue for referral State what he is being seen at there referral office for. Needs Dr. Lenox Ponds. NPI number of provider.  Call back number for Marian Sorrow is:  [phone]  Please advise.

## 2020-07-15 NOTE — Telephone Encounter (Signed)
Saw on referral you were looking into what info was missing this is the info needed please advise if you need more from clinical

## 2020-07-16 ENCOUNTER — Other Ambulatory Visit: Payer: Self-pay

## 2020-07-16 ENCOUNTER — Encounter: Payer: Self-pay | Admitting: Registered Nurse

## 2020-07-16 ENCOUNTER — Ambulatory Visit (INDEPENDENT_AMBULATORY_CARE_PROVIDER_SITE_OTHER): Payer: Medicare Other | Admitting: Registered Nurse

## 2020-07-16 VITALS — BP 143/78 | HR 60 | Temp 98.0°F | Resp 18 | Ht 69.0 in | Wt 161.8 lb

## 2020-07-16 DIAGNOSIS — R413 Other amnesia: Secondary | ICD-10-CM | POA: Diagnosis not present

## 2020-07-16 DIAGNOSIS — Z0000 Encounter for general adult medical examination without abnormal findings: Secondary | ICD-10-CM

## 2020-07-16 DIAGNOSIS — R3914 Feeling of incomplete bladder emptying: Secondary | ICD-10-CM | POA: Diagnosis not present

## 2020-07-16 DIAGNOSIS — N401 Enlarged prostate with lower urinary tract symptoms: Secondary | ICD-10-CM

## 2020-07-16 DIAGNOSIS — E782 Mixed hyperlipidemia: Secondary | ICD-10-CM

## 2020-07-16 DIAGNOSIS — Z0001 Encounter for general adult medical examination with abnormal findings: Secondary | ICD-10-CM

## 2020-07-16 NOTE — Progress Notes (Signed)
Established Patient Office Visit  Subjective:  Patient ID: Anthony Juarez, male    DOB: 22-Feb-1946  Age: 75 y.o. MRN: [id number]  CC:  Chief Complaint  Patient presents with  . Annual Exam    Patient states he is here for an CPE. Per patient he would like to discuss a referral     HPI [name] presents for CPE  Notes that he has been seen by Dr. Krista Blue for work up of his short term memory deficit. She recommended that he get tested for b12, tsh, and rpr. Will draw today. No distinct symptoms of any of these beyond his memory deficit.   Otherwise, feeling well. No urgent concerns or complaints. Still working to get established with the hearing clinic at [facility] - both myself and [name] CMA are working on this.  Recent labs reviewed. Will add lipid panel and PSA today.   Past Medical History:  Diagnosis Date  . Allergy   . Arthritis   . Asthma    childhood  . AVM (arteriovenous malformation) brain 06/15/2013  . Balanitis   . Benign prostatic hypertrophy   . Central sleep apnea   . Cholecystitis with cholelithiasis   . Chronic sinus bradycardia 07/09/2018  . Convulsive disorder ([place])   . Esophageal reflux 03/28/2015  . History of nuclear stress test    Myoview 11/16: EF 60%, normal perfusion, Low Risk  . Hyperlipidemia   . Prostatitis   . Seizures ([place])     Past Surgical History:  Procedure Laterality Date  . ELBOW SURGERY  11/11/2000   repair  . FRACTURE SURGERY     LEFT ELBOW  . TONSILLECTOMY      Family History  Problem Relation Age of Onset  . Heart disease Father        cabg 5's  . Cancer Father        Prostate  . Heart disease Maternal Grandfather        Late 39's  . Dementia Mother     Social History   Socioeconomic History  . Marital status: Married    Spouse name: Anthony Juarez  . Number of children: Not on file  . Years of education: Not on file  . Highest education level: Bachelor's degree (e.g., BA, AB, BS)  Occupational History  .  Occupation: retired    Fish farm manager: RETIRED  Tobacco Use  . Smoking status: Former Smoker    Packs/day: 1.00    Years: 10.00    Pack years: 10.00    Quit date: 07/13/1977    Years since quitting: 43.0  . Smokeless tobacco: Never Used  Vaping Use  . Vaping Use: Never used  Substance and Sexual Activity  . Alcohol use: Yes    Alcohol/week: 2.0 standard drinks    Types: 2 Cans of beer per week    Comment: 2 beers a day  . Drug use: No  . Sexual activity: Not Currently    Birth control/protection: Post-menopausal, Abstinence  Other Topics Concern  . Not on file  Social History Narrative   Patient worked in [employer].   Social Determinants of Health   Financial Resource Strain: Not on file  Food Insecurity: Not on file  Transportation Needs: Not on file  Physical Activity: Not on file  Stress: Not on file  Social Connections: Not on file  Intimate Partner Violence: Not on file    Outpatient Medications Prior to Visit  Medication Sig Dispense Refill  . albuterol (PROVENTIL HFA;VENTOLIN HFA) 108 (  90 Base) MCG/ACT inhaler Inhale 2 puffs into the lungs every 4 (four) hours as needed for wheezing or shortness of breath (cough, shortness of breath or wheezing.). 1 Inhaler 2  . aspirin EC 81 MG tablet Take 81 mg by mouth 2 (two) times a week. Tues and Sat    . Coenzyme Q10 (COQ10) 100 MG CAPS Take 100 mg by mouth daily.    Marland Kitchen doxazosin (CARDURA) 2 MG tablet TAKE 1 TABLET ONCE DAILY. 90 tablet 3  . finasteride (PROSCAR) 5 MG tablet TAKE 1 TABLET ONCE DAILY. 90 tablet 3  . fluticasone (FLONASE) 50 MCG/ACT nasal spray Place 1 spray into both nostrils 2 (two) times daily. 16 g 6  . LamoTRIgine 200 MG TB24 24 hour tablet Take 1 tablet (200 mg total) by mouth at bedtime. 90 tablet 4  . montelukast (SINGULAIR) 10 MG tablet Take 1 tablet (10 mg total) by mouth at bedtime. 90 tablet 3  . Multiple Vitamins-Minerals (MULTIVITAMIN WITH MINERALS) tablet Take 1 tablet by mouth daily.    . NON  FORMULARY Take by mouth at bedtime. Calcium 600 mg, Magnesium 300 mg and Vitamin D3    . rosuvastatin (CRESTOR) 20 MG tablet Take 1 tablet (20 mg total) by mouth daily. 90 tablet 3  . Spacer/Aero-Holding Chambers DEVI 1 Device by Does not apply route every 4 (four) hours as needed. 1 each 2   No facility-administered medications prior to visit.    Allergies  Allergen Reactions  . Penicillins Rash    Did it involve swelling of the face/tongue/throat, SOB, or low BP? No Did it involve sudden or severe rash/hives, skin peeling, or any reaction on the inside of your mouth or nose? Yes Did you need to seek medical attention at a hospital or doctor's office? No When did it last happen?Decades ago. If all above answers are "NO", may proceed with cephalosporin use.     ROS Review of Systems  Constitutional: Negative.   HENT: Negative.   Eyes: Negative.   Respiratory: Negative.   Cardiovascular: Negative.   Gastrointestinal: Negative.   Genitourinary: Negative.   Musculoskeletal: Negative.   Skin: Negative.   Neurological: Negative.   Psychiatric/Behavioral: Negative.   All other systems reviewed and are negative.     Objective:    Physical Exam Vitals and nursing note reviewed.  Constitutional:      General: He is not in acute distress.    Appearance: Normal appearance. He is normal weight. He is not ill-appearing, toxic-appearing or diaphoretic.  HENT:     Head: Normocephalic and atraumatic.     Right Ear: Tympanic membrane, ear canal and external ear normal. There is no impacted cerumen.     Left Ear: Tympanic membrane, ear canal and external ear normal. There is no impacted cerumen.     Nose: Nose normal. No congestion or rhinorrhea.     Mouth/Throat:     Mouth: Mucous membranes are moist.     Pharynx: Oropharynx is clear. No oropharyngeal exudate or posterior oropharyngeal erythema.  Eyes:     General: No scleral icterus.       Right eye: No discharge.        Left  eye: No discharge.     Extraocular Movements: Extraocular movements intact.     Conjunctiva/sclera: Conjunctivae normal.     Pupils: Pupils are equal, round, and reactive to light.  Neck:     Vascular: No carotid bruit.  Cardiovascular:     Rate and Rhythm: Normal  rate and regular rhythm.     Pulses: Normal pulses.     Heart sounds: Normal heart sounds. No murmur heard. No friction rub. No gallop.   Pulmonary:     Effort: Pulmonary effort is normal. No respiratory distress.     Breath sounds: Normal breath sounds. No stridor. No wheezing, rhonchi or rales.  Chest:     Chest wall: No tenderness.  Abdominal:     General: Abdomen is flat. Bowel sounds are normal. There is no distension.     Palpations: Abdomen is soft. There is no mass.     Tenderness: There is no abdominal tenderness. There is no right CVA tenderness, left CVA tenderness, guarding or rebound.     Hernia: No hernia is present.  Musculoskeletal:        General: No swelling, tenderness, deformity or signs of injury. Normal range of motion.     Cervical back: Normal range of motion and neck supple. No rigidity or tenderness.     Right lower leg: No edema.     Left lower leg: No edema.  Lymphadenopathy:     Cervical: No cervical adenopathy.  Skin:    General: Skin is warm and dry.     Capillary Refill: Capillary refill takes less than 2 seconds.     Coloration: Skin is not jaundiced or pale.     Findings: No bruising, erythema, lesion or rash.  Neurological:     General: No focal deficit present.     Mental Status: He is alert and oriented to person, place, and time. Mental status is at baseline.     Cranial Nerves: No cranial nerve deficit.     Motor: No weakness.     Gait: Gait normal.  Psychiatric:        Mood and Affect: Mood normal.        Behavior: Behavior normal.        Thought Content: Thought content normal.        Judgment: Judgment normal.     BP (!) 143/78   Pulse 60   Temp 98 F (36.7 C)  (Temporal)   Resp 18   Ht 5\' 9"  (1.753 m)   Wt 161 lb 12.8 oz (73.4 kg)   SpO2 100%   BMI 23.89 kg/m  Wt Readings from Last 3 Encounters:  07/16/20 161 lb 12.8 oz (73.4 kg)  06/27/20 162 lb 3.2 oz (73.6 kg)  05/15/20 160 lb 3.2 oz (72.7 kg)     There are no preventive care reminders to display for this patient.  There are no preventive care reminders to display for this patient.  Lab Results  Component Value Date   TSH 2.350 04/20/2019   Lab Results  Component Value Date   WBC 6.7 07/11/2020   HGB 14.8 07/11/2020   HCT 45.2 07/11/2020   MCV 93 07/11/2020   PLT 227 07/11/2020   Lab Results  Component Value Date   NA 141 07/11/2020   K 4.5 07/11/2020   CO2 22 07/11/2020   GLUCOSE 89 07/11/2020   BUN 15 07/11/2020   CREATININE 0.97 07/11/2020   BILITOT 0.5 07/11/2020   ALKPHOS 62 07/11/2020   AST 26 07/11/2020   ALT 12 07/11/2020   PROT 6.0 07/11/2020   ALBUMIN 4.2 07/11/2020   CALCIUM 9.5 07/11/2020   ANIONGAP 7 11/11/2018   Lab Results  Component Value Date   CHOL 192 07/13/2019   Lab Results  Component Value Date   HDL 103  07/13/2019   Lab Results  Component Value Date   LDLCALC 80 07/13/2019   Lab Results  Component Value Date   TRIG 47 07/13/2019   Lab Results  Component Value Date   CHOLHDL 1.9 07/13/2019   Lab Results  Component Value Date   HGBA1C 5.6 07/11/2020      Assessment & Plan:   Problem List Items Addressed This Visit      Genitourinary   Benign prostatic hyperplasia with incomplete bladder emptying   Relevant Orders   PSA     Other   Hyperlipidemia   Relevant Orders   Lipid Panel   Memory deficit   Relevant Orders   Vitamin B12   RPR   TSH    Other Visit Diagnoses    Routine general medical examination at a health care facility    -  Primary      No orders of the defined types were placed in this encounter.   Follow-up: No follow-ups on file.   PLAN  No new findings on CPE  Labs collected. Will  follow up with the patient as warranted.  Patient encouraged to call clinic with any questions, comments, or concerns.  Janeece Agee, NP

## 2020-07-16 NOTE — Patient Instructions (Addendum)
   If you have lab work done today you will be contacted with your lab results within the next 2 weeks.  If you have not heard from us then please contact us. The fastest way to get your results is to register for My Chart.   IF you received an x-ray today, you will receive an invoice from [facility]. Please contact [facility] at [phone] with questions or concerns regarding your invoice.   IF you received labwork today, you will receive an invoice from [facility]. Please contact [facility] at [phone] with questions or concerns regarding your invoice.   Our billing staff will not be able to assist you with questions regarding bills from these companies.  You will be contacted with the lab results as soon as they are available. The fastest way to get your results is to activate your My Chart account. Instructions are located on the last page of this paperwork. If you have not heard from us regarding the results in 2 weeks, please contact this office.       Health Maintenance, Male Adopting a healthy lifestyle and getting preventive care are important in promoting health and wellness. Ask your health care provider about:  The right schedule for you to have regular tests and exams.  Things you can do on your own to prevent diseases and keep yourself healthy. What should I know about diet, weight, and exercise? Eat a healthy diet   Eat a diet that includes plenty of vegetables, fruits, low-fat dairy products, and lean protein.  Do not eat a lot of foods that are high in solid fats, added sugars, or sodium. Maintain a healthy weight Body mass index (BMI) is a measurement that can be used to identify possible weight problems. It estimates body fat based on height and weight. Your health care provider can help determine your BMI and help you achieve or maintain a healthy weight. Get regular exercise Get regular exercise. This is one of the most important things  you can do for your health. Most adults should:  Exercise for at least 150 minutes each week. The exercise should increase your heart rate and make you sweat (moderate-intensity exercise).  Do strengthening exercises at least twice a week. This is in addition to the moderate-intensity exercise.  Spend less time sitting. Even light physical activity can be beneficial. Watch cholesterol and blood lipids Have your blood tested for lipids and cholesterol at 73 years of age, then have this test every 5 years. You may need to have your cholesterol levels checked more often if:  Your lipid or cholesterol levels are high.  You are older than 73 years of age.  You are at high risk for heart disease. What should I know about cancer screening? Many types of cancers can be detected early and may often be prevented. Depending on your health history and family history, you may need to have cancer screening at various ages. This may include screening for:  Colorectal cancer.  Prostate cancer.  Skin cancer.  Lung cancer. What should I know about heart disease, diabetes, and high blood pressure? Blood pressure and heart disease  High blood pressure causes heart disease and increases the risk of stroke. This is more likely to develop in people who have high blood pressure readings, are of African descent, or are overweight.  Talk with your health care provider about your target blood pressure readings.  Have your blood pressure checked: ? Every 3-5 years if you are   18-39 years of age. ? Every year if you are 40 years old or older.  If you are between the ages of 65 and 75 and are a current or former smoker, ask your health care provider if you should have a one-time screening for abdominal aortic aneurysm (AAA). Diabetes Have regular diabetes screenings. This checks your fasting blood sugar level. Have the screening done:  Once every three years after age 45 if you are at a normal weight and  have a low risk for diabetes.  More often and at a younger age if you are overweight or have a high risk for diabetes. What should I know about preventing infection? Hepatitis B If you have a higher risk for hepatitis B, you should be screened for this virus. Talk with your health care provider to find out if you are at risk for hepatitis B infection. Hepatitis C Blood testing is recommended for:  Everyone born from 1945 through 1965.  Anyone with known risk factors for hepatitis C. Sexually transmitted infections (STIs)  You should be screened each year for STIs, including gonorrhea and chlamydia, if: ? You are sexually active and are younger than 73 years of age. ? You are older than 73 years of age and your health care provider tells you that you are at risk for this type of infection. ? Your sexual activity has changed since you were last screened, and you are at increased risk for chlamydia or gonorrhea. Ask your health care provider if you are at risk.  Ask your health care provider about whether you are at high risk for HIV. Your health care provider may recommend a prescription medicine to help prevent HIV infection. If you choose to take medicine to prevent HIV, you should first get tested for HIV. You should then be tested every 3 months for as long as you are taking the medicine. Follow these instructions at home: Lifestyle  Do not use any products that contain nicotine or tobacco, such as cigarettes, e-cigarettes, and chewing tobacco. If you need help quitting, ask your health care provider.  Do not use street drugs.  Do not share needles.  Ask your health care provider for help if you need support or information about quitting drugs. Alcohol use  Do not drink alcohol if your health care provider tells you not to drink.  If you drink alcohol: ? Limit how much you have to 0-2 drinks a day. ? Be aware of how much alcohol is in your drink. In the [place] one drink equals  one 12 oz bottle of beer (355 mL), one 5 oz glass of wine (148 mL), or one 1 oz glass of hard liquor (44 mL). General instructions  Schedule regular health, dental, and eye exams.  Stay current with your vaccines.  Tell your health care provider if: ? You often feel depressed. ? You have ever been abused or do not feel safe at home. Summary  Adopting a healthy lifestyle and getting preventive care are important in promoting health and wellness.  Follow your health care provider's instructions about healthy diet, exercising, and getting tested or screened for diseases.  Follow your health care provider's instructions on monitoring your cholesterol and blood pressure. This information is not intended to replace advice given to you by your health care provider. Make sure you discuss any questions you have with your health care provider. Document Revised: 06/22/2018 Document Reviewed: 06/22/2018 Elsevier Patient Education  2020 [employer] Inc.     Why   follow it? Research shows. . Those who follow the Mediterranean diet have a reduced risk of heart disease  . The diet is associated with a reduced incidence of Parkinson's and Alzheimer's diseases . People following the diet may have longer life expectancies and lower rates of chronic diseases  . The Dietary Guidelines for Americans recommends the Mediterranean diet as an eating plan to promote health and prevent disease  What Is the Mediterranean Diet?  . Healthy eating plan based on typical foods and recipes of Mediterranean-style cooking . The diet is primarily a plant based diet; these foods should make up a majority of meals   Starches - Plant based foods should make up a majority of meals - They are an important sources of vitamins, minerals, energy, antioxidants, and fiber - Choose whole grains, foods high in fiber and minimally processed items  - Typical grain sources include wheat, oats, barley, corn, brown rice, bulgar, farro,  millet, polenta, couscous  - Various types of beans include chickpeas, lentils, fava beans, black beans, white beans   Fruits  Veggies - Large quantities of antioxidant rich fruits & veggies; 6 or more servings  - Vegetables can be eaten raw or lightly drizzled with oil and cooked  - Vegetables common to the traditional Mediterranean Diet include: artichokes, arugula, beets, broccoli, brussel sprouts, cabbage, carrots, celery, collard greens, cucumbers, eggplant, kale, leeks, lemons, lettuce, mushrooms, okra, onions, peas, peppers, potatoes, pumpkin, radishes, rutabaga, shallots, spinach, sweet potatoes, turnips, zucchini - Fruits common to the Mediterranean Diet include: apples, apricots, avocados, cherries, clementines, dates, figs, grapefruits, grapes, melons, nectarines, oranges, peaches, pears, pomegranates, strawberries, tangerines  Fats - Replace butter and margarine with healthy oils, such as olive oil, canola oil, and tahini  - Limit nuts to no more than a handful a day  - Nuts include walnuts, almonds, pecans, pistachios, pine nuts  - Limit or avoid candied, honey roasted or heavily salted nuts - Olives are central to the Mediterranean diet - can be eaten whole or used in a variety of dishes   Meats Protein - Limiting red meat: no more than a few times a month - When eating red meat: choose lean cuts and keep the portion to the size of deck of cards - Eggs: approx. 0 to 4 times a week  - Fish and lean poultry: at least 2 a week  - Healthy protein sources include, chicken, turkey, lean beef, lamb - Increase intake of seafood such as tuna, salmon, trout, mackerel, shrimp, scallops - Avoid or limit high fat processed meats such as sausage and bacon  Dairy - Include moderate amounts of low fat dairy products  - Focus on healthy dairy such as fat free yogurt, skim milk, low or reduced fat cheese - Limit dairy products higher in fat such as whole or 2% milk, cheese, ice cream  Alcohol -  Moderate amounts of red wine is ok  - No more than 5 oz daily for women (all ages) and men older than age 65  - No more than 10 oz of wine daily for men younger than 65  Other - Limit sweets and other desserts  - Use herbs and spices instead of salt to flavor foods  - Herbs and spices common to the traditional Mediterranean Diet include: basil, bay leaves, chives, cloves, cumin, fennel, garlic, lavender, marjoram, mint, oregano, parsley, pepper, rosemary, sage, savory, sumac, tarragon, thyme   It's not just a diet, it's a lifestyle:  . The Mediterranean diet includes   lifestyle factors typical of those in the region  . Foods, drinks and meals are best eaten with others and savored . Daily physical activity is important for overall good health . This could be strenuous exercise like running and aerobics . This could also be more leisurely activities such as walking, housework, yard-work, or taking the stairs . Moderation is the key; a balanced and healthy diet accommodates most foods and drinks . Consider portion sizes and frequency of consumption of certain foods   Meal Ideas & Options:  . Breakfast:  o Whole wheat toast or whole wheat English muffins with peanut butter & hard boiled egg o Steel cut oats topped with apples & cinnamon and skim milk  o Fresh fruit: banana, strawberries, melon, berries, peaches  o Smoothies: strawberries, bananas, greek yogurt, peanut butter o Low fat greek yogurt with blueberries and granola  o Egg white omelet with spinach and mushrooms o Breakfast couscous: whole wheat couscous, apricots, skim milk, cranberries  . Sandwiches:  o Hummus and grilled vegetables (peppers, zucchini, squash) on whole wheat bread   o Grilled chicken on whole wheat pita with lettuce, tomatoes, cucumbers or tzatziki  o Tuna salad on whole wheat bread: tuna salad made with greek yogurt, olives, red peppers, capers, green onions o Garlic rosemary lamb pita: lamb sauted with garlic,  rosemary, salt & pepper; add lettuce, cucumber, greek yogurt to pita - flavor with lemon juice and black pepper  . Seafood:  o Mediterranean grilled salmon, seasoned with garlic, basil, parsley, lemon juice and black pepper o Shrimp, lemon, and spinach whole-grain pasta salad made with low fat greek yogurt  o Seared scallops with lemon orzo  o Seared tuna steaks seasoned salt, pepper, coriander topped with tomato mixture of olives, tomatoes, olive oil, minced garlic, parsley, green onions and cappers  . Meats:  o Herbed greek chicken salad with kalamata olives, cucumber, feta  o Red bell peppers stuffed with spinach, bulgur, lean ground beef (or lentils) & topped with feta   o Kebabs: skewers of chicken, tomatoes, onions, zucchini, squash  o Turkey burgers: made with red onions, mint, dill, lemon juice, feta cheese topped with roasted red peppers . Vegetarian o Cucumber salad: cucumbers, artichoke hearts, celery, red onion, feta cheese, tossed in olive oil & lemon juice  o Hummus and whole grain pita points with a greek salad (lettuce, tomato, feta, olives, cucumbers, red onion) o Lentil soup with celery, carrots made with vegetable broth, garlic, salt and pepper  o Tabouli salad: parsley, bulgur, mint, scallions, cucumbers, tomato, radishes, lemon juice, olive oil, salt and pepper.       Fat and Cholesterol Restricted Eating Plan Eating a diet that limits fat and cholesterol may help lower your risk for heart disease and other conditions. Your body needs fat and cholesterol for basic functions, but eating too much of these things can be harmful to your health. Your health care provider may order lab tests to check your blood fat (lipid) and cholesterol levels. This helps your health care provider understand your risk for certain conditions and whether you need to make diet changes. Work with your health care provider or dietitian to make an eating plan that is right for you. Your plan  includes:  Limit your fat intake to ______% or less of your total calories a day.  Limit your saturated fat intake to ______% or less of your total calories a day.  Limit the amount of cholesterol in your diet to less than   _________mg a day.  Eat ___________ g of fiber a day. What are tips for following this plan? General guidelines   If you are overweight, work with your health care provider to lose weight safely. Losing just 5-10% of your body weight can improve your overall health and help prevent diseases such as diabetes and heart disease.  Avoid: ? Foods with added sugar. ? Fried foods. ? Foods that contain partially hydrogenated oils, including stick margarine, some tub margarines, cookies, crackers, and other baked goods.  Limit alcohol intake to no more than 1 drink a day for nonpregnant women and 2 drinks a day for men. One drink equals 12 oz of beer, 5 oz of wine, or 1 oz of hard liquor. Reading food labels  Check food labels for: ? Trans fats, partially hydrogenated oils, or high amounts of saturated fat. Avoid foods that contain saturated fat and trans fat. ? The amount of cholesterol in each serving. Try to eat no more than 200 mg of cholesterol each day. ? The amount of fiber in each serving. Try to eat at least 20-30 g of fiber each day.  Choose foods with healthy fats, such as: ? Monounsaturated and polyunsaturated fats. These include olive and canola oil, flaxseeds, walnuts, almonds, and seeds. ? Omega-3 fats. These are found in foods such as salmon, mackerel, sardines, tuna, flaxseed oil, and ground flaxseeds.  Choose grain products that have whole grains. Look for the word "whole" as the first word in the ingredient list. Cooking  Cook foods using methods other than frying. Baking, boiling, grilling, and broiling are some healthy options.  Eat more home-cooked food and less restaurant, buffet, and fast food.  Avoid cooking using saturated fats. ? Animal  sources of saturated fats include meats, butter, and cream. ? Plant sources of saturated fats include palm oil, palm kernel oil, and coconut oil. Meal planning   At meals, imagine dividing your plate into fourths: ? Fill one-half of your plate with vegetables and green salads. ? Fill one-fourth of your plate with whole grains. ? Fill one-fourth of your plate with lean protein foods.  Eat fish that is high in omega-3 fats at least two times a week.  Eat more foods that contain fiber, such as whole grains, beans, apples, broccoli, carrots, peas, and barley. These foods help promote healthy cholesterol levels in the blood. Recommended foods Grains  Whole grains, such as whole wheat or whole grain breads, crackers, cereals, and pasta. Unsweetened oatmeal, bulgur, barley, quinoa, or brown rice. Corn or whole wheat flour tortillas. Vegetables  Fresh or frozen vegetables (raw, steamed, roasted, or grilled). Green salads. Fruits  All fresh, canned (in natural juice), or frozen fruits. Meats and other protein foods  Ground beef (85% or leaner), grass-fed beef, or beef trimmed of fat. Skinless chicken or turkey. Ground chicken or turkey. Pork trimmed of fat. All fish and seafood. Egg whites. Dried beans, peas, or lentils. Unsalted nuts or seeds. Unsalted canned beans. Natural nut butters without added sugar and oil. Dairy  Low-fat or nonfat dairy products, such as skim or 1% milk, 2% or reduced-fat cheeses, low-fat and fat-free ricotta or cottage cheese, or plain low-fat and nonfat yogurt. Fats and oils  Tub margarine without trans fats. Light or reduced-fat mayonnaise and salad dressings. Avocado. Olive, canola, sesame, or safflower oils. The items listed above may not be a complete list of recommended foods or beverages. Contact your dietitian for more options. Foods to avoid Grains  White bread. White   pasta. White rice. Cornbread. Bagels, pastries, and croissants. Crackers and snack  foods that contain trans fat and hydrogenated oils. Vegetables  Vegetables cooked in cheese, cream, or butter sauce. Fried vegetables. Fruits  Canned fruit in heavy syrup. Fruit in cream or butter sauce. Fried fruit. Meats and other protein foods  Fatty cuts of meat. Ribs, chicken wings, bacon, sausage, bologna, salami, chitterlings, fatback, hot dogs, bratwurst, and packaged lunch meats. Liver and organ meats. Whole eggs and egg yolks. Chicken and turkey with skin. Fried meat. Dairy  Whole or 2% milk, cream, half-and-half, and cream cheese. Whole milk cheeses. Whole-fat or sweetened yogurt. Full-fat cheeses. Nondairy creamers and whipped toppings. Processed cheese, cheese spreads, and cheese curds. Beverages  Alcohol. Sugar-sweetened drinks such as sodas, lemonade, and fruit drinks. Fats and oils  Butter, stick margarine, lard, shortening, ghee, or bacon fat. Coconut, palm kernel, and palm oils. Sweets and desserts  Corn syrup, sugars, honey, and molasses. Candy. Jam and jelly. Syrup. Sweetened cereals. Cookies, pies, cakes, donuts, muffins, and ice cream. The items listed above may not be a complete list of foods and beverages to avoid. Contact your dietitian for more information. Summary  Your body needs fat and cholesterol for basic functions. However, eating too much of these things can be harmful to your health.  Work with your health care provider and dietitian to follow a diet low in fat and cholesterol. Doing this may help lower your risk for heart disease and other conditions.  Choose healthy fats, such as monounsaturated and polyunsaturated fats, and foods high in omega-3 fatty acids.  Eat fiber-rich foods, such as whole grains, beans, peas, fruits, and vegetables.  Limit or avoid alcohol, fried foods, and foods high in saturated fats, partially hydrogenated oils, and sugar. This information is not intended to replace advice given to you by your health care provider. Make  sure you discuss any questions you have with your health care provider. Document Revised: 06/11/2017 Document Reviewed: 03/16/2017 Elsevier Patient Education  2020 [employer] Inc.  American Heart Association (AHA) Exercise Recommendation  Being physically active is important to prevent heart disease and stroke, the nation's No. 1and No. 5killers. To improve overall cardiovascular health, we suggest at least 150 minutes per week of moderate exercise or 75 minutes per week of vigorous exercise (or a combination of moderate and vigorous activity). Thirty minutes a day, five times a week is an easy goal to remember. You will also experience benefits even if you divide your time into two or three segments of 10 to 15 minutes per day.  For people who would benefit from lowering their blood pressure or cholesterol, we recommend 40 minutes of aerobic exercise of moderate to vigorous intensity three to four times a week to lower the risk for heart attack and stroke.  Physical activity is anything that makes you move your body and burn calories.  This includes things like climbing stairs or playing sports. Aerobic exercises benefit your heart, and include walking, jogging, swimming or biking. Strength and stretching exercises are best for overall stamina and flexibility.  The simplest, positive change you can make to effectively improve your heart health is to start walking. It's enjoyable, free, easy, social and great exercise. A walking program is flexible and boasts high success rates because people can stick with it. It's easy for walking to become a regular and satisfying part of life.   For Overall Cardiovascular Health:  At least 30 minutes of moderate-intensity aerobic activity at least 5 days   per week for a total of 150  OR   At least 25 minutes of vigorous aerobic activity at least 3 days per week for a total of 75 minutes; or a combination of moderate- and vigorous-intensity aerobic  activity  AND   Moderate- to high-intensity muscle-strengthening activity at least 2 days per week for additional health benefits.  For Lowering Blood Pressure and Cholesterol  An average 40 minutes of moderate- to vigorous-intensity aerobic activity 3 or 4 times per week  What if I can't make it to the time goal? Something is always better than nothing! And everyone has to start somewhere. Even if you've been sedentary for years, today is the day you can begin to make healthy changes in your life. If you don't think you'll make it for 30 or 40 minutes, set a reachable goal for today. You can work up toward your overall goal by increasing your time as you get stronger. Don't let all-or-nothing thinking rob you of doing what you can every day.  Source:http://www.heart.org    

## 2020-07-17 LAB — LIPID PANEL
Chol/HDL Ratio: 2.1 ratio (ref 0.0–5.0)
Cholesterol, Total: 196 mg/dL (ref 100–199)
HDL: 92 mg/dL (ref >39)
LDL Chol Calc (NIH): 90 mg/dL (ref 0–99)
Triglycerides: 80 mg/dL (ref 0–149)
VLDL Cholesterol Cal: 14 mg/dL (ref 5–40)

## 2020-07-17 LAB — TSH: TSH: 2.68 u[IU]/mL (ref 0.450–4.500)

## 2020-07-17 LAB — VITAMIN B12: Vitamin B-12: 516 pg/mL (ref 232–1245)

## 2020-07-17 LAB — PSA: Prostate Specific Ag, Serum: 0.5 ng/mL (ref 0.0–4.0)

## 2020-07-17 LAB — RPR: RPR Ser Ql: NONREACTIVE

## 2020-07-18 NOTE — Addendum Note (Signed)
Addended by: Janeece Agee on: 07/18/2020 05:30 PM   Modules accepted: Level of Service

## 2020-07-19 ENCOUNTER — Encounter: Payer: Self-pay | Admitting: Registered Nurse

## 2020-07-19 NOTE — Telephone Encounter (Signed)
Pt needs more information for their referral in order to qualify please help get this sorted pt is getting frustrated and considering dropping this rather than being seen

## 2020-07-23 NOTE — Telephone Encounter (Signed)
Faxed this to them, if other issue please let me know

## 2020-08-01 ENCOUNTER — Encounter: Payer: Self-pay | Admitting: Family Medicine

## 2020-08-07 ENCOUNTER — Other Ambulatory Visit: Payer: Self-pay

## 2020-08-07 ENCOUNTER — Encounter: Payer: Self-pay | Admitting: Family Medicine

## 2020-08-07 ENCOUNTER — Ambulatory Visit (INDEPENDENT_AMBULATORY_CARE_PROVIDER_SITE_OTHER): Payer: Medicare Other | Admitting: Family Medicine

## 2020-08-07 VITALS — BP 132/81 | HR 53 | Temp 98.3°F | Ht 69.0 in | Wt 161.0 lb

## 2020-08-07 DIAGNOSIS — G40909 Epilepsy, unspecified, not intractable, without status epilepticus: Secondary | ICD-10-CM

## 2020-08-07 DIAGNOSIS — R3129 Other microscopic hematuria: Secondary | ICD-10-CM

## 2020-08-07 DIAGNOSIS — R3914 Feeling of incomplete bladder emptying: Secondary | ICD-10-CM | POA: Diagnosis not present

## 2020-08-07 DIAGNOSIS — J3089 Other allergic rhinitis: Secondary | ICD-10-CM

## 2020-08-07 DIAGNOSIS — M545 Low back pain, unspecified: Secondary | ICD-10-CM

## 2020-08-07 DIAGNOSIS — Z23 Encounter for immunization: Secondary | ICD-10-CM | POA: Diagnosis not present

## 2020-08-07 DIAGNOSIS — M25552 Pain in left hip: Secondary | ICD-10-CM

## 2020-08-07 DIAGNOSIS — N401 Enlarged prostate with lower urinary tract symptoms: Secondary | ICD-10-CM

## 2020-08-07 DIAGNOSIS — E782 Mixed hyperlipidemia: Secondary | ICD-10-CM | POA: Diagnosis not present

## 2020-08-07 NOTE — Progress Notes (Signed)
Subjective:  Patient ID: Anthony Juarez, male    DOB: Jun 14, 1946  Age: 75 y.o. MRN: [id number]  CC:  Chief Complaint  Patient presents with  . Transitions Of Care    Pt reports feeling well. Pt states he want to ask the Provider about some hip pain on his R side. Pt states he was told by a chiropractor that his pelvis was tilted by 8 mm's to the R.    HPI Anthony Juarez presents for  Transition of care, previous primary provider Dr. Pamella Pert.  History of seizure disorder, BPH, reflux, AVM of brain, allergic rhinitis, memory deficit. Did not tolerate CPAP for OSA in past.   Left hip pain  Hiking past 2.5 years. Notes 3-4 mi into hike, improves with rest, stretches.  Saw chiropracter 3-4 yrs ago. noted leg length discrepancy - 44mm shorter on R? Tried lift in shoe. Uncomfortable. Trouble changing to other shoes.  Pain on outside of hip. Tension on some surrounding muscles. New stretches since last visit helping - few times per day. Some low back pain at times, paraspinal. Notices in am, better during day. No prior PT for back/hip.  Sore for years.  No bowel or bladder incontinence, no saddle anesthesia, no lower extremity weakness.  No fevers, night sweats, or unintentional weight loss.  tx Ibuprofen - 1-2 per day.    Allergic rhinitis,  treated with Flonase as needed,  has albuterol if needed -none in 1-2 years.   BPH Treated with Cardura 2 mg, finasteride 5 mg daily. Working well.  Stable PSA at annual physical exam January 4. Chronic microscopic hematuria, cystoscopy with urologist Dr. Gloriann Loan in 2021.  Lab Results  Component Value Date   PSA1 0.5 07/16/2020   PSA1 1.0 07/13/2019   PSA1 0.5 07/08/2018   PSA 0.4 05/14/2016   PSA 0.8 04/11/2016   PSA 0.90 03/24/2015   Seizure disorder, memory deficit. Neurologist Dr. Krista Blue.  Last visit December 16.  B12, TSH, RPR checked January 4 were normal.  Moca 29 out of 30 in December. Continue on lamotrigine 200 mg extended release  daily.  Superficial right frontal cavernous angioma on previous MRI brain.  1 year follow-up with neuro.  Flu vaccine today, had coid vaccine and booster.  Hearing exam pending at [facility].  Hyperlipidemia: seen by cardiology, Dr. Marlou Porch in October 2021.  Stable bradycardia without high risk symptoms such as syncope.  Family history of CAD, secondary risk factor prevention with CCS 152, 62nd percentile in 2020.  Continued on Crestor 20 mg daily for hyperlipidemia.  Optimal LDL less than 70.  Recent LDL 90 as above on January 4.HDL very good.    Lab Results  Component Value Date   CHOL 196 07/16/2020   HDL 92 07/16/2020   LDLCALC 90 07/16/2020   TRIG 80 07/16/2020   CHOLHDL 2.1 07/16/2020   Lab Results  Component Value Date   ALT 12 07/11/2020   AST 26 07/11/2020   ALKPHOS 62 07/11/2020   BILITOT 0.5 07/11/2020  History Patient Active Problem List   Diagnosis Date Noted  . Memory deficit 06/27/2020  . Chronic sinus bradycardia 07/09/2018  . Abnormal bowel movement 07/09/2018  . Hyperinflation of lungs 07/09/2018  . Non-seasonal allergic rhinitis 07/09/2018  . Abnormal finding on MRI of brain 12/04/2016  . Benign prostatic hyperplasia with incomplete bladder emptying 11/11/2016  . Hyperlipidemia 05/13/2016  . Esophageal reflux 03/28/2015  . Shortness of breath 03/28/2015  . Skin lesion of face 03/28/2015  . AVM (  arteriovenous malformation) brain 06/15/2013  . Seizure disorder ([place]) 01/19/2013  . Central sleep apnea 06/10/2012  . Epigastric pain 06/06/2012  . Gallstones 06/06/2012  . Nausea 06/06/2012   Past Medical History:  Diagnosis Date  . Allergy   . Arthritis   . Asthma    childhood  . AVM (arteriovenous malformation) brain 06/15/2013  . Balanitis   . Benign prostatic hypertrophy   . Central sleep apnea   . Cholecystitis with cholelithiasis   . Chronic sinus bradycardia 07/09/2018  . Convulsive disorder ([place])   . Esophageal reflux 03/28/2015  . History of  nuclear stress test    Myoview 11/16: EF 60%, normal perfusion, Low Risk  . Hyperlipidemia   . Prostatitis   . Seizures ([place])    Past Surgical History:  Procedure Laterality Date  . ELBOW SURGERY  11/11/2000   repair  . FRACTURE SURGERY     LEFT ELBOW  . TONSILLECTOMY     Allergies  Allergen Reactions  . Penicillins Rash    Did it involve swelling of the face/tongue/throat, SOB, or low BP? No Did it involve sudden or severe rash/hives, skin peeling, or any reaction on the inside of your mouth or nose? Yes Did you need to seek medical attention at a hospital or doctor's office? No When did it last happen?Decades ago. If all above answers are "NO", may proceed with cephalosporin use.    Prior to Admission medications   Medication Sig Start Date End Date Taking? Authorizing Provider  albuterol (PROVENTIL HFA;VENTOLIN HFA) 108 (90 Base) MCG/ACT inhaler Inhale 2 puffs into the lungs every 4 (four) hours as needed for wheezing or shortness of breath (cough, shortness of breath or wheezing.). 06/01/17  Yes Shawnee Knapp, MD  aspirin EC 81 MG tablet Take 81 mg by mouth 2 (two) times a week. Tues and Sat   Yes [provider]  Coenzyme Q10 (COQ10) 100 MG CAPS Take 100 mg by mouth daily.   Yes [provider]  doxazosin (CARDURA) 2 MG tablet TAKE 1 TABLET ONCE DAILY. 10/02/19  Yes Jacelyn Pi, Lilia Argue, MD  finasteride (PROSCAR) 5 MG tablet TAKE 1 TABLET ONCE DAILY. 10/02/19  Yes Jacelyn Pi, Irma M, MD  fluticasone [facility]) 50 MCG/ACT nasal spray Place 1 spray into both nostrils 2 (two) times daily. 07/13/19  Yes Jacelyn Pi, Lilia Argue, MD  LamoTRIgine 200 MG TB24 24 hour tablet Take 1 tablet (200 mg total) by mouth at bedtime. 06/27/20  Yes Marcial Pacas, MD  melatonin 1 MG TABS tablet Take 2 mg by mouth at bedtime.   Yes [provider]  montelukast (SINGULAIR) 10 MG tablet Take 1 tablet (10 mg total) by mouth at bedtime. 07/11/20  Yes Wendie Agreste, MD   Multiple Vitamins-Minerals (MULTIVITAMIN WITH MINERALS) tablet Take 1 tablet by mouth daily.   Yes [provider]  NON FORMULARY Take by mouth at bedtime. Calcium 600 mg, Magnesium 300 mg and Vitamin D3   Yes [provider]  rosuvastatin (CRESTOR) 20 MG tablet Take 1 tablet (20 mg total) by mouth daily. 04/30/20  Yes Jerline Pain, MD  Spacer/Aero-Holding Chambers DEVI 1 Device by Does not apply route every 4 (four) hours as needed. 10/31/18  Yes Robyn Haber, MD   Social History   Socioeconomic History  . Marital status: Married    Spouse name: [name]  . Number of children: Not on file  . Years of education: Not on file  . Highest education level:  Bachelor's degree (e.g., BA, AB, BS)  Occupational History  . Occupation: retired    Fish farm manager: RETIRED  Tobacco Use  . Smoking status: Former Smoker    Packs/day: 1.00    Years: 10.00    Pack years: 10.00    Quit date: 07/13/1977    Years since quitting: 43.0  . Smokeless tobacco: Never Used  Vaping Use  . Vaping Use: Never used  Substance and Sexual Activity  . Alcohol use: Yes    Alcohol/week: 2.0 standard drinks    Types: 2 Cans of beer per week    Comment: 2 beers a day  . Drug use: No  . Sexual activity: Not Currently    Birth control/protection: Post-menopausal, Abstinence  Other Topics Concern  . Not on file  Social History Narrative   Patient worked in [employer].   Social Determinants of Health   Financial Resource Strain: Not on file  Food Insecurity: Not on file  Transportation Needs: Not on file  Physical Activity: Not on file  Stress: Not on file  Social Connections: Not on file  Intimate Partner Violence: Not on file    Review of Systems   Objective:   Vitals:   08/07/20 0925  BP: 132/81  Pulse: (!) 53  Temp: 98.3 F (36.8 C)  TempSrc: Temporal  SpO2: 99%  Weight: 161 lb (73 kg)  Height: 5\' 9"  (1.753 m)     Physical Exam Vitals reviewed.  Constitutional:       Appearance: He is well-developed and well-nourished.  HENT:     Head: Normocephalic and atraumatic.  Eyes:     Extraocular Movements: EOM normal.     Pupils: Pupils are equal, round, and reactive to light.  Neck:     Vascular: No carotid bruit or JVD.  Cardiovascular:     Rate and Rhythm: Normal rate and regular rhythm.     Heart sounds: Normal heart sounds. No murmur heard.   Pulmonary:     Effort: Pulmonary effort is normal.     Breath sounds: Normal breath sounds. No rales.  Musculoskeletal:        General: No edema.     Comments: Lumbar spine, no focal bony tenderness, no significant paraspinal tenderness, SI joint or sciatic tenderness.  No appreciable spasm.  Full range of motion.  Able to heel and toe walk without difficulty.  Negative seated straight leg raise.  Left hip -pain-free internal/external rotation.  No focal bony tenderness, trochanter bursa nontender.  Skin:    General: Skin is warm and dry.  Neurological:     Mental Status: He is alert and oriented to person, place, and time.  Psychiatric:        Mood and Affect: Mood and affect normal.     33 minutes spent during visit, greater than 50% counseling and assimilation of information, chart review, and discussion of plan.    Assessment & Plan:  Sanjeev Renna is a 75 y.o. male . Left-sided low back pain without sciatica, unspecified chronicity Left hip pain  -With history of leg length discrepancy, may have some tight musculature versus some degenerative changes but no known injury, no red flags on exam or history.  Discussed option of formal physical therapy.  We will continue stretches/home exercises at this time with recheck in 6 weeks for possible imaging or refer to PT at that time.  RTC precautions if worse  Need for vaccination - Plan: Flu Vaccine QUAD High Dose(Fluad)  Mixed hyperlipidemia  -LDL  above optimal goal but a great HDL.  Tolerating current regimen, no changes.  Benign prostatic  hyperplasia with incomplete bladder emptying Microscopic hematuria  -Stable BPH symptoms with current meds.  Chronic microscopic hematuria evaluated by urology.  No changes.  Non-seasonal allergic rhinitis, unspecified trigger  -Stable with as needed meds.  Seizure disorder ([place])  -Stable with current regimen, continue follow-up with neurology.  MoCA score for concerns with memory discussed in December with recent labs reassuring.  Monitor for changes.  No orders of the defined types were placed in this encounter.  Patient Instructions   Continue stretches for hip and back, especially after exercise as that may be more beneficial. I would consider physical therapy or at least imaging if stretches are not improving hip/back symptoms.  Recheck 6 weeks. Return to the clinic or go to the nearest emergency room if any of your symptoms worsen or new symptoms occur.  No change in other medications today.  Good talking to you today.  Let me know if there are questions prior to your follow-up visit.   If you have lab work done today you will be contacted with your lab results within the next 2 weeks.  If you have not heard from [place] then please contact us. The fastest way to get your results is to register for My Chart.   IF you received an x-ray today, you will receive an invoice from [facility]. Please contact [facility] at [phone] with questions or concerns regarding your invoice.   IF you received labwork today, you will receive an invoice from [place] [facility]. Please contact [facility] at [phone] with questions or concerns regarding your invoice.   Our billing staff will not be able to assist you with questions regarding bills from these companies.  You will be contacted with the lab results as soon as they are available. The fastest way to get your results is to activate your My Chart account. Instructions are located on the last page of this paperwork. If you have not  heard from [place] regarding the results in 2 weeks, please contact this office.         Signed, Merri Ray, MD [facility]

## 2020-08-07 NOTE — Patient Instructions (Addendum)
Continue stretches for hip and back, especially after exercise as that may be more beneficial. I would consider physical therapy or at least imaging if stretches are not improving hip/back symptoms.  Recheck 6 weeks. Return to the clinic or go to the nearest emergency room if any of your symptoms worsen or new symptoms occur.  No change in other medications today.  Good talking to you today.  Let me know if there are questions prior to your follow-up visit.   If you have lab work done today you will be contacted with your lab results within the next 2 weeks.  If you have not heard from [place] then please contact us. The fastest way to get your results is to register for My Chart.   IF you received an x-ray today, you will receive an invoice from [facility]. Please contact [facility] at [phone] with questions or concerns regarding your invoice.   IF you received labwork today, you will receive an invoice from [place]. Please contact [facility] at [phone] with questions or concerns regarding your invoice.   Our billing staff will not be able to assist you with questions regarding bills from these companies.  You will be contacted with the lab results as soon as they are available. The fastest way to get your results is to activate your My Chart account. Instructions are located on the last page of this paperwork. If you have not heard from [place] regarding the results in 2 weeks, please contact this office.

## 2020-08-21 DIAGNOSIS — H903 Sensorineural hearing loss, bilateral: Secondary | ICD-10-CM | POA: Diagnosis not present

## 2020-08-30 ENCOUNTER — Other Ambulatory Visit: Payer: Self-pay | Admitting: Family Medicine

## 2020-08-30 NOTE — Telephone Encounter (Signed)
Requested medication (s) are due for refill today: Yes  Requested medication (s) are on the active medication list: Yes  Last refill:  07/13/19  Future visit scheduled: Yes  Notes to clinic:  Unable to refill per protocol, Rx expired.       Requested Prescriptions  Pending Prescriptions Disp Refills   fluticasone (FLONASE) 50 MCG/ACT nasal spray [Pharmacy Med Name: fluticasone propionate 50 mcg/actuation nasal spray,suspension] 16 g 6    Sig: USE 1 SPRAY IN EACH NOSTRIL TWICE A DAY.      Ear, Nose, and Throat: Nasal Preparations - Corticosteroids Passed - 08/30/2020  4:41 PM      Passed - Valid encounter within last 12 months    Recent Outpatient Visits           3 weeks ago Left-sided low back pain without sciatica, unspecified chronicity   [facility], Ranell Patrick, MD   1 month ago Memory deficit   [facility], Delfino Lovett, NP   1 month ago Wellness examination   [facility], Fenton Malling, MD   3 months ago Bilateral hearing loss, unspecified hearing loss type   [facility], Delfino Lovett, NP   1 year ago Medicare annual wellness visit, subsequent   Primary [facility] at [facility], Lilia Argue, MD       Future Appointments             In 2 weeks Carlota Raspberry Ranell Patrick, MD [facility] at [place], [facility]

## 2020-09-16 ENCOUNTER — Other Ambulatory Visit: Payer: Self-pay

## 2020-09-16 ENCOUNTER — Encounter: Payer: Self-pay | Admitting: Family Medicine

## 2020-09-16 ENCOUNTER — Ambulatory Visit (INDEPENDENT_AMBULATORY_CARE_PROVIDER_SITE_OTHER): Payer: Medicare Other | Admitting: Family Medicine

## 2020-09-16 VITALS — BP 131/73 | HR 56 | Temp 98.4°F | Ht 69.0 in | Wt 162.0 lb

## 2020-09-16 DIAGNOSIS — M436 Torticollis: Secondary | ICD-10-CM | POA: Diagnosis not present

## 2020-09-16 DIAGNOSIS — M545 Low back pain, unspecified: Secondary | ICD-10-CM | POA: Diagnosis not present

## 2020-09-16 MED ORDER — MELOXICAM 7.5 MG PO TABS: 7.5000 mg | ORAL_TABLET | Freq: Every day | ORAL | 0 refills | Status: DC

## 2020-09-16 NOTE — Patient Instructions (Addendum)
  Ok to continue stretches, meloxicam once during the day as needed (avoid taking other NSAIDS like Alleve or Ibuprofen while taking this) and I will order zray at [facility] and physical therapy.   I will also order x-ray of the neck and physical therapy can work on range of motion there as well.   Return to the clinic or go to the nearest emergency room if any of your symptoms worsen or new symptoms occur.   If you have lab work done today you will be contacted with your lab results within the next 2 weeks.  If you have not heard from [place] then please contact us. The fastest way to get your results is to register for My Chart.   IF you received an x-ray today, you will receive an invoice from [facility]. Please contact [facility] at [phone] with questions or concerns regarding your invoice.   IF you received labwork today, you will receive an invoice from [place]. Please contact [facility] at [phone] with questions or concerns regarding your invoice.   Our billing staff will not be able to assist you with questions regarding bills from these companies.  You will be contacted with the lab results as soon as they are available. The fastest way to get your results is to activate your My Chart account. Instructions are located on the last page of this paperwork. If you have not heard from [place] regarding the results in 2 weeks, please contact this office.

## 2020-09-16 NOTE — Progress Notes (Signed)
Subjective:  Patient ID: Anthony Juarez, male    DOB: [birth year]  Age: 75 y.o. MRN: [id number]  CC:  Chief Complaint  Patient presents with  . Follow-up    On lower back and hip pain. PT reports the pain has been mostly in the lower back since last OV. Pt also states the pain in the hip has moved to a different location in the L hip more so into the L buttocks area. Pt reports it feels like nerve pain.    HPI Anthony Juarez presents for   Left-sided low back pain/hip pain With history of Ivans discrepancy, possible thigh musculature versus some degenerative changes discussed that his January 26 visit.  No red flags at that time, continue home stretches/home exercises. Pain more deep in left buttocks, more in low back. No leg radiation.  No fever, night sweats, or weight loss.  No bowel or bladder incontinence, no saddle anesthesia, no lower extremity weakness.  Pain for months.  Outside hip pain better, but sore if hiking after 3 miles.  No ortho.   Tx: ibuprofen- backed off recently. BID-tid dosing prior.  Home stretches. No formal PT for back.  No back imaging.   Neck issue: 6 weeks ago - hiking, tripped, recovered, neck whipped back - like whiplash. Some pain in neck, persistent tightness, no arm weakness. No dysesthesias. Limited to turn head right.    History Patient Active Problem List   Diagnosis Date Noted  . Memory deficit 06/27/2020  . Chronic sinus bradycardia 07/09/2018  . Abnormal bowel movement 07/09/2018  . Hyperinflation of lungs 07/09/2018  . Non-seasonal allergic rhinitis 07/09/2018  . Abnormal finding on MRI of brain 12/04/2016  . Benign prostatic hyperplasia with incomplete bladder emptying 11/11/2016  . Hyperlipidemia 05/13/2016  . Esophageal reflux 03/28/2015  . Shortness of breath 03/28/2015  . Skin lesion of face 03/28/2015  . AVM (arteriovenous malformation) brain 06/15/2013  . Seizure disorder ([place]) 01/19/2013  . Central sleep apnea  06/10/2012  . Epigastric pain 06/06/2012  . Gallstones 06/06/2012  . Nausea 06/06/2012   Past Medical History:  Diagnosis Date  . Allergy   . Arthritis   . Asthma    childhood  . AVM (arteriovenous malformation) brain 06/15/2013  . Balanitis   . Benign prostatic hypertrophy   . Central sleep apnea   . Cholecystitis with cholelithiasis   . Chronic sinus bradycardia 07/09/2018  . Convulsive disorder ([place])   . Esophageal reflux 03/28/2015  . History of nuclear stress test    Myoview 11/16: EF 60%, normal perfusion, Low Risk  . Hyperlipidemia   . Prostatitis   . Seizures ([place])    Past Surgical History:  Procedure Laterality Date  . ELBOW SURGERY  11/11/2000   repair  . FRACTURE SURGERY     LEFT ELBOW  . TONSILLECTOMY     Allergies  Allergen Reactions  . Penicillins Rash    Did it involve swelling of the face/tongue/throat, SOB, or low BP? No Did it involve sudden or severe rash/hives, skin peeling, or any reaction on the inside of your mouth or nose? Yes Did you need to seek medical attention at a hospital or doctor's office? No When did it last happen?Decades ago. If all above answers are "NO", may proceed with cephalosporin use.    Prior to Admission medications   Medication Sig Start Date End Date Taking? Authorizing Provider  albuterol (PROVENTIL HFA;VENTOLIN HFA) 108 (90 Base) MCG/ACT inhaler Inhale 2 puffs into the lungs every 4 (  four) hours as needed for wheezing or shortness of breath (cough, shortness of breath or wheezing.). 06/01/17  Yes Shawnee Knapp, MD  aspirin EC 81 MG tablet Take 81 mg by mouth 2 (two) times a week. Tues and Sat   Yes [provider]  doxazosin (CARDURA) 2 MG tablet TAKE 1 TABLET ONCE DAILY. 10/02/19  Yes Jacelyn Pi, Lilia Argue, MD  finasteride (PROSCAR) 5 MG tablet TAKE 1 TABLET ONCE DAILY. 10/02/19  Yes Jacelyn Pi, Irma M, MD  fluticasone [facility]) 50 MCG/ACT nasal spray USE 1 SPRAY IN EACH NOSTRIL TWICE A DAY. 09/02/20  Yes  Wendie Agreste, MD  LamoTRIgine 200 MG TB24 24 hour tablet Take 1 tablet (200 mg total) by mouth at bedtime. 06/27/20  Yes Marcial Pacas, MD  melatonin 1 MG TABS tablet Take 2 mg by mouth at bedtime.   Yes [provider]  montelukast (SINGULAIR) 10 MG tablet Take 1 tablet (10 mg total) by mouth at bedtime. 07/11/20  Yes Wendie Agreste, MD  Multiple Vitamins-Minerals (MULTIVITAMIN WITH MINERALS) tablet Take 1 tablet by mouth daily.   Yes [provider]  NON FORMULARY Take by mouth at bedtime. Calcium 600 mg, Magnesium 300 mg and Vitamin D3   Yes [provider]  rosuvastatin (CRESTOR) 20 MG tablet Take 1 tablet (20 mg total) by mouth daily. 04/30/20  Yes Jerline Pain, MD  Coenzyme Q10 (COQ10) 100 MG CAPS Take 100 mg by mouth daily.    [provider]  Spacer/Aero-Holding Chambers DEVI 1 Device by Does not apply route every 4 (four) hours as needed. 10/31/18   Robyn Haber, MD   Social History   Socioeconomic History  . Marital status: Married    Spouse name: [name]  . Number of children: Not on file  . Years of education: Not on file  . Highest education level: Bachelor's degree (e.g., BA, AB, BS)  Occupational History  . Occupation: retired    Fish farm manager: RETIRED  Tobacco Use  . Smoking status: Former Smoker    Packs/day: 1.00    Years: 10.00    Pack years: 10.00    Quit date: 07/13/1977    Years since quitting: 43.2  . Smokeless tobacco: Never Used  Vaping Use  . Vaping Use: Never used  Substance and Sexual Activity  . Alcohol use: Yes    Alcohol/week: 2.0 standard drinks    Types: 2 Cans of beer per week    Comment: 2 beers a day  . Drug use: No  . Sexual activity: Not Currently    Birth control/protection: Post-menopausal, Abstinence  Other Topics Concern  . Not on file  Social History Narrative   Patient worked in [employer].   Social Determinants of Health   Financial Resource Strain: Not on file  Food Insecurity: Not  on file  Transportation Needs: Not on file  Physical Activity: Not on file  Stress: Not on file  Social Connections: Not on file  Intimate Partner Violence: Not on file    Review of Systems Per HPI.   Objective:   Vitals:   09/16/20 1354  BP: 131/73  Pulse: (!) 56  Temp: 98.4 F (36.9 C)  TempSrc: Temporal  SpO2: 97%  Weight: 162 lb (73.5 kg)  Height: 5\' 9"  (1.753 m)     Physical Exam Constitutional:      General: He is not in acute distress.    Appearance: He is well-developed and well-nourished.  HENT:  Head: Normocephalic and atraumatic.  Cardiovascular:     Rate and Rhythm: Normal rate.  Pulmonary:     Effort: Pulmonary effort is normal.  Musculoskeletal:     Comments: C spine: No midline bony tenderness.  Slight decreased range of motion with right rotation, lateral flexion compared to left.  Decreased extension.   Reflexes 1+ at brachioradialis, difficulty with tricep/bicep reflex bilaterally.   Lumbar spine, minimal tenderness to palpation of the paraspinal musculature as well as lower mid lumbar spine.  Negative seated straight leg raise, pain-free internal/external rotation of hips bilaterally, no pain with piriformis stretch.  Reflexes 2+ at patella and Achilles bilaterally, lower extremity strength intact and equal bilaterally, able to heel and toe walk without difficulty.    Neurological:     Mental Status: He is alert and oriented to person, place, and time.  Psychiatric:        Mood and Affect: Mood and affect normal.        Assessment & Plan:  Elieser Tetrick is a 75 y.o. male . Left-sided low back pain without sciatica, unspecified chronicity - Plan: DG Lumbar Spine Complete, meloxicam (MOBIC) 7.5 MG tablet, Ambulatory referral to Physical Therapy, DG Cervical Spine Complete, CANCELED: Ambulatory referral to Physical Therapy  Neck stiffness - Plan: Ambulatory referral to Physical Therapy, DG Cervical Spine Complete  Recurrent low back  pain, suspect component of degenerative disc disease.  Check x-ray, start meloxicam, avoid other NSAIDs.  Refer to physical therapy.  Option of orthopedic eval but would like to try PT first.  Slight decreased range of motion of right rotation since fall 6 weeks ago, possible paraspinal strain at that time.  Check imaging, also to be eval by PT.  RTC precautions  Meds ordered this encounter  Medications  . meloxicam (MOBIC) 7.5 MG tablet    Sig: Take 1 tablet (7.5 mg total) by mouth daily.    Dispense:  30 tablet    Refill:  0   Patient Instructions    Ok to continue stretches, meloxicam once during the day as needed (avoid taking other NSAIDS like Alleve or Ibuprofen while taking this) and I will order zray at [facility]., and physical therapy.   I will also order x-ray of the neck and physical therapy can work on range of motion there as well.   Return to the clinic or go to the nearest emergency room if any of your symptoms worsen or new symptoms occur.   If you have lab work done today you will be contacted with your lab results within the next 2 weeks.  If you have not heard from [place] then please contact us. The fastest way to get your results is to register for My Chart.   IF you received an x-ray today, you will receive an invoice from [facility]. Please contact [facility] at [phone] with questions or concerns regarding your invoice.   IF you received labwork today, you will receive an invoice from [facility]. Please contact [facility] at [phone] with questions or concerns regarding your invoice.   Our billing staff will not be able to assist you with questions regarding bills from these companies.  You will be contacted with the lab results as soon as they are available. The fastest way to get your results is to activate your My Chart account. Instructions are located on the last page of this paperwork. If you have not heard from [place] regarding the  results in 2 weeks, please  contact this office.         Signed, Merri Ray, MD [facility]

## 2020-09-17 ENCOUNTER — Ambulatory Visit
Admission: RE | Admit: 2020-09-17 | Discharge: 2020-09-17 | Disposition: A | Discharge status: Home / Self Care | Payer: Medicare Other | Source: Ambulatory Visit | Attending: Family Medicine | Admitting: Family Medicine

## 2020-09-17 DIAGNOSIS — M545 Low back pain, unspecified: Secondary | ICD-10-CM | POA: Diagnosis not present

## 2020-09-17 DIAGNOSIS — M436 Torticollis: Secondary | ICD-10-CM

## 2020-09-17 DIAGNOSIS — M542 Cervicalgia: Secondary | ICD-10-CM | POA: Diagnosis not present

## 2020-09-24 ENCOUNTER — Other Ambulatory Visit: Payer: Self-pay | Admitting: Family Medicine

## 2020-09-24 DIAGNOSIS — H353132 Nonexudative age-related macular degeneration, bilateral, intermediate dry stage: Secondary | ICD-10-CM | POA: Diagnosis not present

## 2020-09-24 DIAGNOSIS — H43813 Vitreous degeneration, bilateral: Secondary | ICD-10-CM | POA: Diagnosis not present

## 2020-09-24 DIAGNOSIS — H43393 Other vitreous opacities, bilateral: Secondary | ICD-10-CM | POA: Diagnosis not present

## 2020-09-24 DIAGNOSIS — H3540 Unspecified peripheral retinal degeneration: Secondary | ICD-10-CM | POA: Diagnosis not present

## 2020-09-24 NOTE — Telephone Encounter (Signed)
Requested Prescriptions  Pending Prescriptions Disp Refills  . doxazosin (CARDURA) 2 MG tablet [Pharmacy Med Name: doxazosin 2 mg tablet] 90 tablet 0    Sig: TAKE 1 TABLET ONCE DAILY.     Cardiovascular:  Alpha Blockers Passed - 09/24/2020 11:37 AM      Passed - Last BP in normal range    BP Readings from Last 1 Encounters:  09/16/20 131/73         Passed - Valid encounter within last 6 months    Recent Outpatient Visits          1 week ago Left-sided low back pain without sciatica, unspecified chronicity   [facility], Ranell Patrick, MD   1 month ago Left-sided low back pain without sciatica, unspecified chronicity   [facility], Ranell Patrick, MD   2 months ago Memory deficit   [facility], Delfino Lovett, NP   2 months ago Wellness examination   [facility], Fenton Malling, MD   4 months ago Bilateral hearing loss, unspecified hearing loss type   [facility], [place], NP             . finasteride (PROSCAR) 5 MG tablet [Pharmacy Med Name: finasteride 5 mg tablet] 90 tablet 2    Sig: TAKE 1 TABLET ONCE DAILY.     Urology: 5-alpha Reductase Inhibitors Passed - 09/24/2020 11:37 AM      Passed - Valid encounter within last 12 months    Recent Outpatient Visits          1 week ago Left-sided low back pain without sciatica, unspecified chronicity   [facility], Ranell Patrick, MD   1 month ago Left-sided low back pain without sciatica, unspecified chronicity   [facility], Ranell Patrick, MD   2 months ago Memory deficit   [facility], Delfino Lovett, NP   2 months ago Wellness examination   [facility] at [facility], Fenton Malling, MD   4 months ago Bilateral hearing loss, unspecified hearing loss type   [facility] at [place], NP

## 2020-10-04 ENCOUNTER — Encounter: Payer: Self-pay | Admitting: Family Medicine

## 2020-10-13 ENCOUNTER — Other Ambulatory Visit: Payer: Self-pay | Admitting: Family Medicine

## 2020-10-13 DIAGNOSIS — M545 Low back pain, unspecified: Secondary | ICD-10-CM

## 2020-10-13 NOTE — Telephone Encounter (Signed)
Routed to Dr. Greene 

## 2020-11-07 IMAGING — CR DG CHEST 2V
2 series · 2 of 2 positions shown · non-contrast
Comparison: Radiographs 09/02/2015.

CLINICAL DATA: Chest pain/pressure.

EXAM:
CHEST - 2 VIEW

[w chest pa]
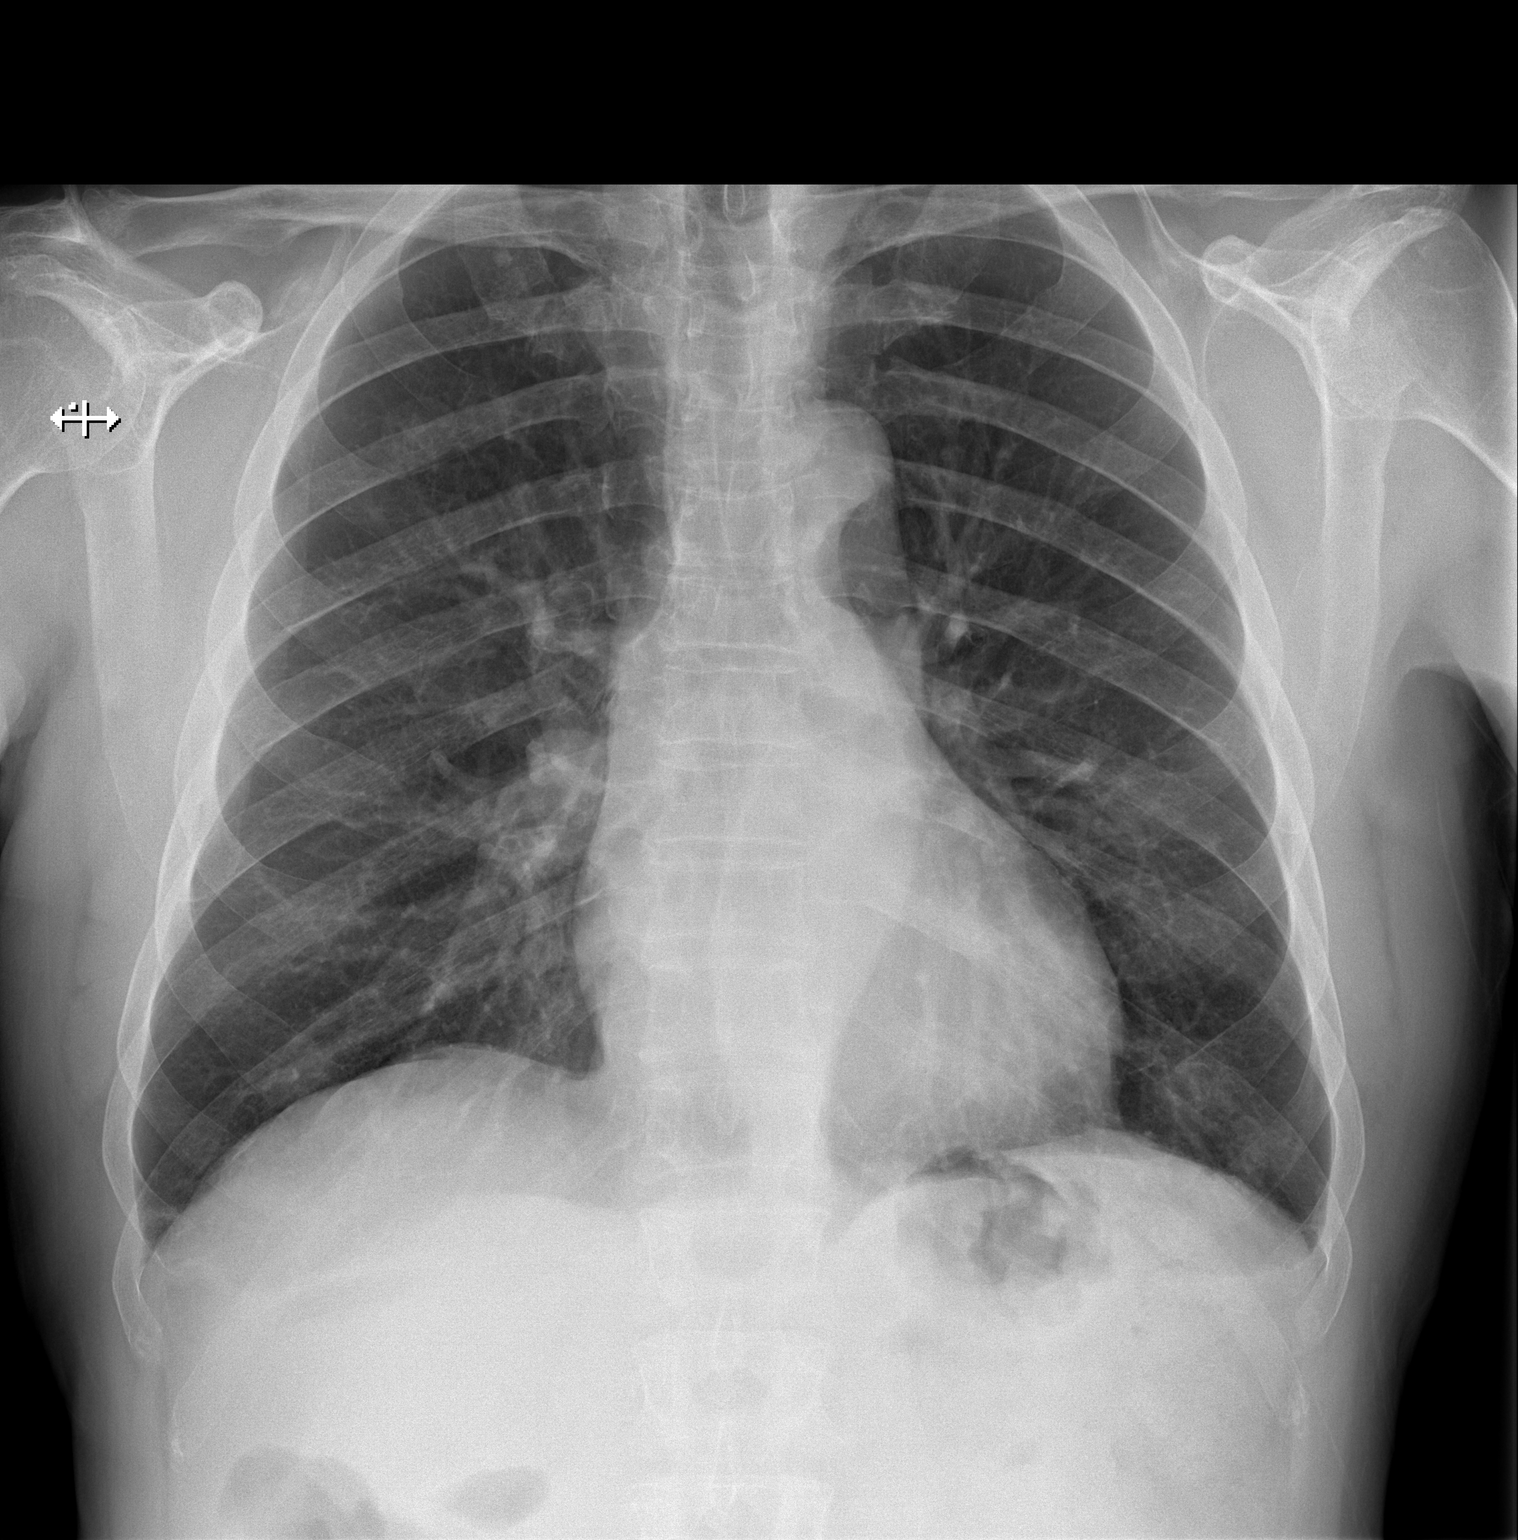

[w chest lat]
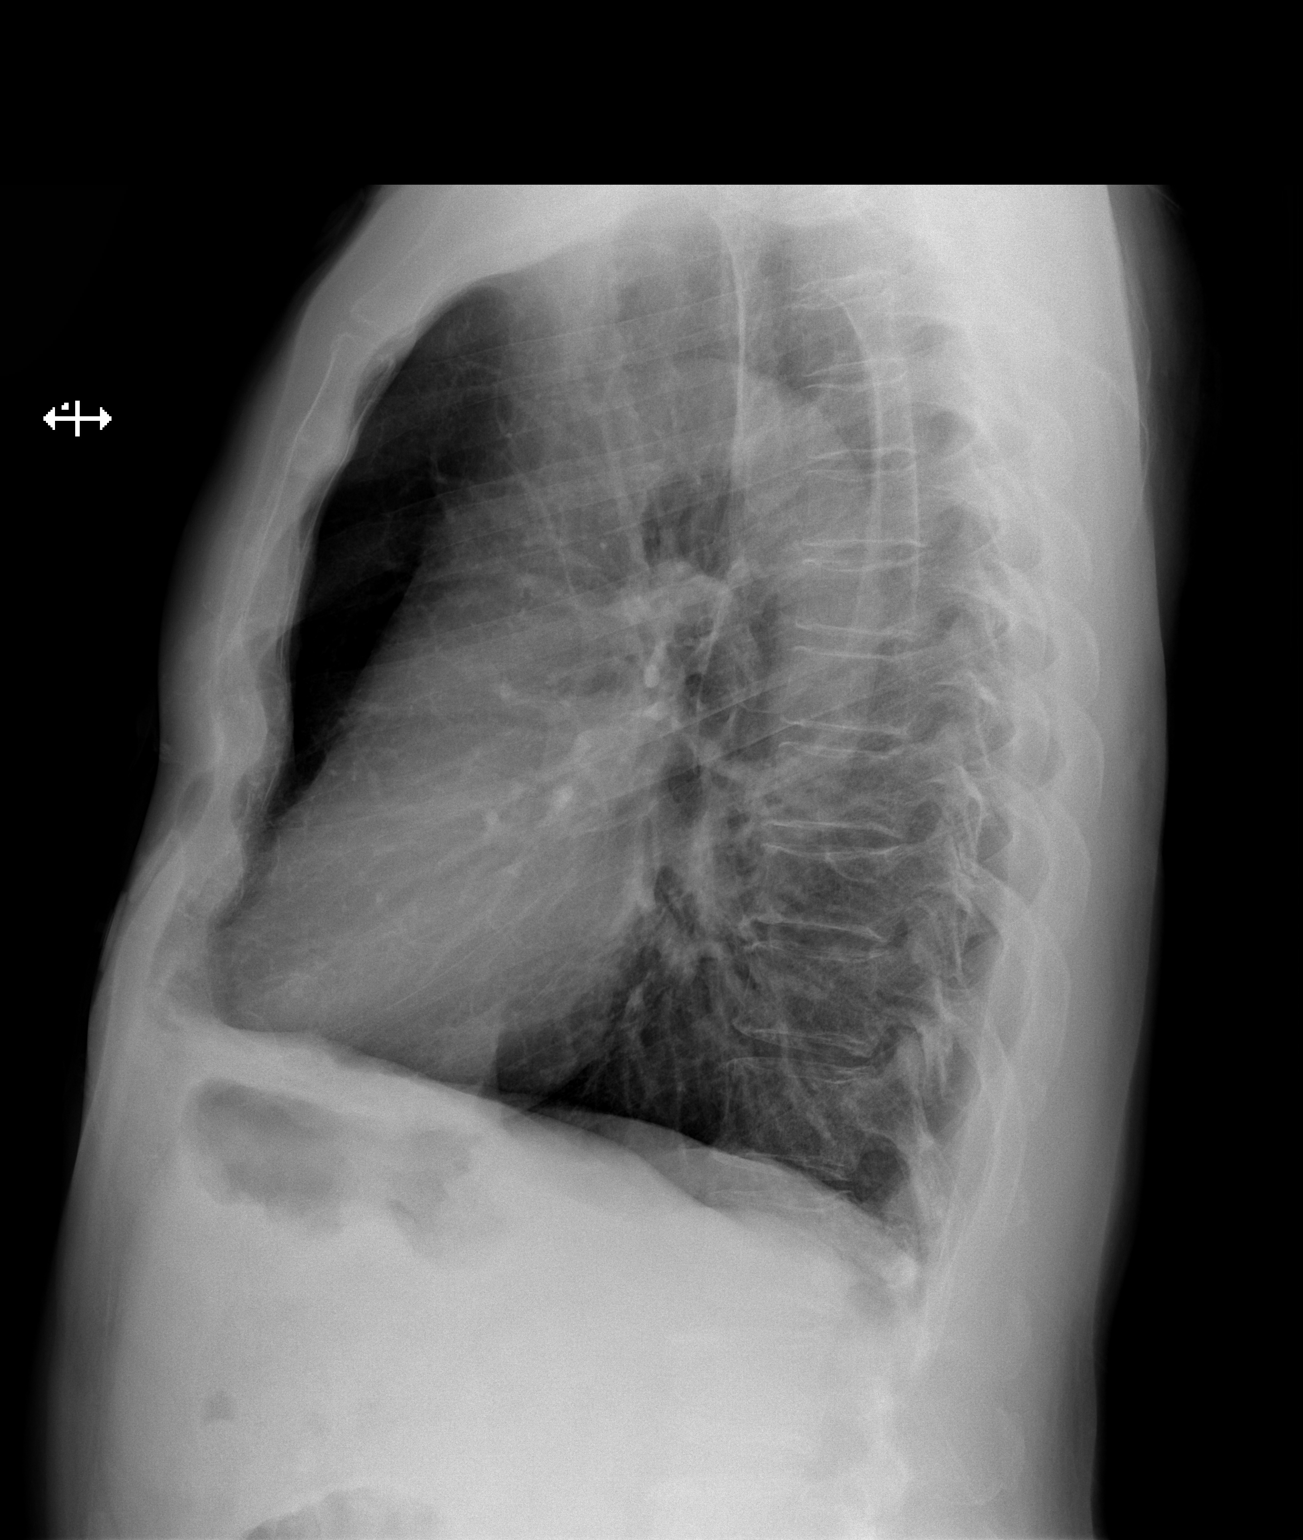

[2 of 2 positions shown; findings below may reference images not displayed]

FINDINGS: The heart size and mediastinal contours are normal. The lungs are
clear. There is no pleural effusion or pneumothorax. No acute
osseous findings are identified. Nodular density projecting over the
posterior aspect of the right 5th rib on the frontal examination is
likely an EKG snap as confirmed on lateral view.
IMPRESSION: No active cardiopulmonary process.

## 2020-11-17 ENCOUNTER — Other Ambulatory Visit: Payer: Self-pay | Admitting: Family Medicine

## 2020-11-17 DIAGNOSIS — M545 Low back pain, unspecified: Secondary | ICD-10-CM

## 2020-11-18 NOTE — Telephone Encounter (Signed)
LFD 10/15/20 #30 with no refills LOV 09/16/20 NOV none

## 2020-12-22 ENCOUNTER — Other Ambulatory Visit: Payer: Self-pay | Admitting: Family Medicine

## 2020-12-22 DIAGNOSIS — M545 Low back pain, unspecified: Secondary | ICD-10-CM

## 2020-12-23 NOTE — Telephone Encounter (Signed)
LFD 11/19/20 #30 with no refills LOV 09/16/20 NOV none

## 2021-01-01 ENCOUNTER — Other Ambulatory Visit: Payer: Self-pay | Admitting: Family Medicine

## 2021-01-03 ENCOUNTER — Encounter: Payer: Self-pay | Admitting: Family Medicine

## 2021-01-03 DIAGNOSIS — M545 Low back pain, unspecified: Secondary | ICD-10-CM

## 2021-01-03 NOTE — Telephone Encounter (Signed)
Pt is asking if okay to continue meloxicam 7.5 mg reports still works well no side effects.

## 2021-01-17 DIAGNOSIS — Z20822 Contact with and (suspected) exposure to covid-19: Secondary | ICD-10-CM | POA: Diagnosis not present

## 2021-01-18 NOTE — Addendum Note (Signed)
Addended by: Merri Ray R on: 01/18/2021 02:23 PM   Modules accepted: Orders

## 2021-01-23 DIAGNOSIS — Z23 Encounter for immunization: Secondary | ICD-10-CM | POA: Diagnosis not present

## 2021-01-28 ENCOUNTER — Ambulatory Visit: Payer: Medicare Other | Admitting: Physical Therapy

## 2021-01-29 ENCOUNTER — Encounter: Payer: Self-pay | Admitting: Physical Therapy

## 2021-01-29 ENCOUNTER — Ambulatory Visit (INDEPENDENT_AMBULATORY_CARE_PROVIDER_SITE_OTHER): Payer: Medicare Other | Admitting: Physical Therapy

## 2021-01-29 ENCOUNTER — Other Ambulatory Visit: Payer: Self-pay

## 2021-01-29 DIAGNOSIS — M545 Low back pain, unspecified: Secondary | ICD-10-CM | POA: Diagnosis not present

## 2021-01-30 ENCOUNTER — Other Ambulatory Visit: Payer: Self-pay | Admitting: Family Medicine

## 2021-01-30 DIAGNOSIS — M545 Low back pain, unspecified: Secondary | ICD-10-CM

## 2021-01-31 ENCOUNTER — Telehealth: Payer: Self-pay

## 2021-01-31 NOTE — Telephone Encounter (Signed)
Patient called to schedule appointment via letter received. Pt got through through to triage directly some how but was able to address his concerns.  Patient wanted to schedule annual visit with Dr. Marlou Porch. Due in October. Pt schedule at [facility] office with [facility] on 10/24 @ 9am. Pt aware that he is a new office and agreed to see him there.  No additional questions at this time.

## 2021-02-03 ENCOUNTER — Encounter: Payer: Self-pay | Admitting: Physical Therapy

## 2021-02-03 NOTE — Therapy (Signed)
[facility] [address] [place], [place], [address] Phone: [phone]   Fax:  [phone]  Physical Therapy Evaluation  Patient Details  Name: Anthony Juarez MRN: [id number] Date of Birth: [birth year] Referring Provider (PT): Corliss Parish   Encounter Date: 01/29/2021   PT End of Session - 02/03/21 1548     Visit Number 1    Number of Visits 12    Date for PT Re-Evaluation 03/12/21    Authorization Type Medicare    PT Start Time [id number]    PT Stop Time 1425    PT Time Calculation (min) 40 min    Activity Tolerance Patient tolerated treatment well    Behavior During Therapy [facility] for tasks assessed/performed             Past Medical History:  Diagnosis Date   Allergy    Arthritis    Asthma    childhood   AVM (arteriovenous malformation) brain 06/15/2013   Balanitis    Benign prostatic hypertrophy    Central sleep apnea    Cholecystitis with cholelithiasis    Chronic sinus bradycardia 07/09/2018   Convulsive disorder ([place])    Esophageal reflux 03/28/2015   History of nuclear stress test    Myoview 11/16: EF 60%, normal perfusion, Low Risk   Hyperlipidemia    Prostatitis    Seizures ([place])     Past Surgical History:  Procedure Laterality Date   ELBOW SURGERY  11/11/2000   repair   FRACTURE SURGERY     LEFT ELBOW   TONSILLECTOMY      There were no vitals filed for this visit.    Subjective Assessment - 02/03/21 1546     Subjective Pt states increased pain since March. Notes pain with prolonged walking. He likes to [facility] , up to 15 mi/wk , has increased pain  4 miles. States pain in back, cetner, both sides. Has been taking Meloxicam.    Pertinent History L5/S1 severe degeneration    Limitations Standing;Walking    Patient Stated Goals decreased pain, increased ability for walking, activity without pain    Currently in Pain? Yes    Pain Score 7     Pain Location Back    Pain Orientation Right;Left    Pain  Descriptors / Indicators Aching    Pain Type Acute pain;Chronic pain    Pain Onset More than a month ago    Pain Frequency Intermittent    Aggravating Factors  walking                [facility] PT Assessment - 02/03/21 0001       Assessment   Medical Diagnosis Low Back Pain    Referring Provider (PT) Corliss Parish    Prior Therapy no      Precautions   Precautions None      Balance Screen   Has the patient fallen in the past 6 months No      Prior Function   Level of Independence Independent      Cognition   Overall Cognitive Status Within Functional Limits for tasks assessed      ROM / Strength   AROM / PROM / Strength AROM;Strength      AROM   Overall AROM Comments Lumbar: mild/mod limitation for extension. Hips: [facility]      Strength   Overall Strength Comments Hips: 4+/5      Palpation   Palpation comment Mild tenderness with PAs and bil lumbar paraspinal palpation  Special Tests   Other special tests Neg SLR. Audible and palpable grinding of lumbar segments with attemps for fig 4 stretch in supine, with posterior pelvic tilt (discussed avoiding this)      Ambulation/Gait   Gait Comments unremarkable , wearing Altra shoes                        Objective measurements completed on examination: See above findings.       [place] Adult PT Treatment/Exercise - 02/03/21 0001       Exercises   Exercises Lumbar      Lumbar Exercises: Stretches   Active Hamstring Stretch 3 reps;30 seconds    Active Hamstring Stretch Limitations seated    Single Knee to Chest Stretch 3 reps;30 seconds    Lower Trunk Rotation 5 reps;10 seconds    Pelvic Tilt 20 reps    Piriformis Stretch 3 reps;30 seconds    Piriformis Stretch Limitations seated only (supine causing grinding in lumbar spine )      Lumbar Exercises: Supine   Bridge 20 reps      Manual Therapy   Manual Therapy Manual Traction    Manual Traction long leg distraction for lumbar pump x 2 min  bil;                    PT Education - 02/03/21 1548     Education Details PT POC, Exam findings, HEP, Footwear    Person(s) Educated Patient    Methods Explanation;Demonstration;Tactile cues;Verbal cues;Handout    Comprehension Verbalized understanding;Returned demonstration;Verbal cues required;Tactile cues required;Need further instruction              PT Short Term Goals - 02/03/21 1557       PT SHORT TERM GOAL #1   Title Pt to be independent with initial HEP    Time 2    Period Weeks    Status New    Target Date 02/12/21               PT Long Term Goals - 02/03/21 1558       PT LONG TERM GOAL #1   Title Pt to be independent with final HEP    Time 6    Period Weeks    Status New    Target Date 03/12/21      PT LONG TERM GOAL #2   Title Pt to report ability for ambulation up to 2 hr, with decreased pain in low back, to 0-2/10 , for improved ability for community and recreation activity.    Time 6    Period Weeks    Status New    Target Date 03/12/21                    Plan - 02/03/21 1624     Clinical Impression Statement Pt presents with primary complaint of increased pain in low back, with increased standing, walking, and higher milage hiking. Pt with limited ability for exercise and functional activity due to pain. Pt with minimal ROM deficits, he does have mild strength deficits for core, and will benefit from HEP for lumbar mobility, decompression, and strengthening. Pt to benefit from skilled PT to improve deficits and reduce pain levels.    Examination-Activity Limitations Stand;Locomotion Level    Examination-Participation Restrictions [employer];Shop    Stability/Clinical Decision Making Stable/Uncomplicated    Clinical Decision Making Low    Rehab Potential Good  PT Frequency 1x / week    PT Duration 6 weeks    PT Treatment/Interventions ADLs/Self Care Home Management;Electrical  Stimulation;Cryotherapy;Iontophoresis '4mg'$ /ml Dexamethasone;Moist Heat;Traction;Ultrasound;Stair training;Gait training;Functional mobility training;Therapeutic exercise;Therapeutic activities;Neuromuscular re-education;Patient/family education;Manual techniques;Passive range of motion;Dry needling;Taping;Spinal Manipulations;Joint Manipulations    Consulted and Agree with Plan of Care Patient             Patient will benefit from skilled therapeutic intervention in order to improve the following deficits and impairments:  Pain, Decreased mobility, Decreased activity tolerance, Decreased strength  Visit Diagnosis: Acute bilateral low back pain without sciatica     Problem List Patient Active Problem List   Diagnosis Date Noted   Memory deficit 06/27/2020   Chronic sinus bradycardia 07/09/2018   Abnormal bowel movement 07/09/2018   Hyperinflation of lungs 07/09/2018   Non-seasonal allergic rhinitis 07/09/2018   Abnormal finding on MRI of brain 12/04/2016   Benign prostatic hyperplasia with incomplete bladder emptying 11/11/2016   Hyperlipidemia 05/13/2016   Esophageal reflux 03/28/2015   Shortness of breath 03/28/2015   Skin lesion of face 03/28/2015   AVM (arteriovenous malformation) brain 06/15/2013   Seizure disorder ([place]) 01/19/2013   Central sleep apnea 06/10/2012   Epigastric pain 06/06/2012   Gallstones 06/06/2012   Nausea 06/06/2012   Lyndee Hensen, PT, DPT 4:35 PM  02/03/21    [facility] [address] [place], [place], [address] Phone: [phone]   Fax:  [phone]  Name: Anthony Juarez MRN: [id number] Date of Birth: 1946/04/20

## 2021-02-05 ENCOUNTER — Other Ambulatory Visit: Payer: Self-pay

## 2021-02-05 ENCOUNTER — Ambulatory Visit (INDEPENDENT_AMBULATORY_CARE_PROVIDER_SITE_OTHER): Payer: Medicare Other | Admitting: Family Medicine

## 2021-02-05 ENCOUNTER — Encounter: Payer: Self-pay | Admitting: Family Medicine

## 2021-02-05 VITALS — BP 126/66 | HR 78 | Temp 98.2°F | Resp 16 | Ht 69.0 in | Wt 152.8 lb

## 2021-02-05 DIAGNOSIS — M436 Torticollis: Secondary | ICD-10-CM | POA: Diagnosis not present

## 2021-02-05 DIAGNOSIS — M545 Low back pain, unspecified: Secondary | ICD-10-CM

## 2021-02-05 MED ORDER — MELOXICAM 7.5 MG PO TABS: 7.5000 mg | ORAL_TABLET | Freq: Every day | ORAL | 1 refills | Status: DC

## 2021-02-05 NOTE — Patient Instructions (Addendum)
Ok to continue PT for now, meloxicam for now.  Give me an update in the next 4 to 6 weeks at the most, and if not significantly improving would recommend meeting with spine specialist.  Let me know if there are questions sooner and take care.

## 2021-02-05 NOTE — Progress Notes (Signed)
Subjective:  Patient ID: Anthony Juarez, male    DOB: 1945-08-23  Age: 75 y.o. MRN: [id number]  CC:  Chief Complaint  Patient presents with   Back Pain    Pt here for recheck on back hip and neck, doing okay, reports about the same since last OV, has tried to rest    HPI [name] presents for  Follow-up neck/back pain.  Recurrent low back pain discussed with March 7 visit, thought to be component of degenerative disc disease as well as possible paraspinal strain of neck.  C-spine imaging with mild degenerative appearing retrolisthesis of C4 on C5 with moderate disc and facet degeneration at that level and C5-6.  No acute abnormalities.  Lumbar spine imaging without acute osseous abnormality.  Chronic severe L5-S1 disc degeneration.  Referred for physical therapy, started meloxicam at his March 7 visit.  MyChart message update March 25 with some improvement of neck pain, and was improving with meloxicam.  Still some soreness in R side of neck muscles, occasional popping, clicking. Some decreased ROM to R. No radicular sx's. No pain into arm or dysesthesias, no weakness. PT with similar stretches at 1st visit last week with PT. [place] [facility] [employer].   Back pain sore with mile 2-3 in hiking. 1 PT session so far. Better with rest.  Mobic daily. Helps some. No radiating pain.  No bowel or bladder incontinence, no saddle anesthesia, no lower extremity weakness.  No wt loss, night sweats or fevers.      History Patient Active Problem List   Diagnosis Date Noted   Memory deficit 06/27/2020   Chronic sinus bradycardia 07/09/2018   Abnormal bowel movement 07/09/2018   Hyperinflation of lungs 07/09/2018   Non-seasonal allergic rhinitis 07/09/2018   Abnormal finding on MRI of brain 12/04/2016   Benign prostatic hyperplasia with incomplete bladder emptying 11/11/2016   Hyperlipidemia 05/13/2016   Esophageal reflux 03/28/2015   Shortness of breath 03/28/2015   Skin  lesion of face 03/28/2015   AVM (arteriovenous malformation) brain 06/15/2013   Seizure disorder ([place]) 01/19/2013   Central sleep apnea 06/10/2012   Epigastric pain 06/06/2012   Gallstones 06/06/2012   Nausea 06/06/2012   Past Medical History:  Diagnosis Date   Allergy    Arthritis    Asthma    childhood   AVM (arteriovenous malformation) brain 06/15/2013   Balanitis    Benign prostatic hypertrophy    Central sleep apnea    Cholecystitis with cholelithiasis    Chronic sinus bradycardia 07/09/2018   Convulsive disorder ([place])    Esophageal reflux 03/28/2015   History of nuclear stress test    Myoview 11/16: EF 60%, normal perfusion, Low Risk   Hyperlipidemia    Prostatitis    Seizures ([place])    Past Surgical History:  Procedure Laterality Date   ELBOW SURGERY  11/11/2000   repair   FRACTURE SURGERY     LEFT ELBOW   TONSILLECTOMY     Allergies  Allergen Reactions   Penicillins Rash    Did it involve swelling of the face/tongue/throat, SOB, or low BP? No Did it involve sudden or severe rash/hives, skin peeling, or any reaction on the inside of your mouth or nose? Yes Did you need to seek medical attention at a hospital or doctor's office? No When did it last happen?  Decades ago. If all above answers are "NO", may proceed with cephalosporin use.    Prior to Admission medications   Medication Sig Start  Date End Date Taking? Authorizing Provider  albuterol (PROVENTIL HFA;VENTOLIN HFA) 108 (90 Base) MCG/ACT inhaler Inhale 2 puffs into the lungs every 4 (four) hours as needed for wheezing or shortness of breath (cough, shortness of breath or wheezing.). 06/01/17  Yes Shawnee Knapp, MD  aspirin EC 81 MG tablet Take 81 mg by mouth 2 (two) times a week. Tues and Sat   Yes [provider]  doxazosin (CARDURA) 2 MG tablet TAKE 1 TABLET ONCE DAILY. 01/01/21  Yes Wendie Agreste, MD  finasteride (PROSCAR) 5 MG tablet TAKE 1 TABLET ONCE DAILY. 09/24/20  Yes Wendie Agreste,  MD  fluticasone [facility]) 50 MCG/ACT nasal spray USE 1 SPRAY IN EACH NOSTRIL TWICE A DAY. 09/02/20  Yes Wendie Agreste, MD  LamoTRIgine 200 MG TB24 24 hour tablet Take 1 tablet (200 mg total) by mouth at bedtime. 06/27/20  Yes Marcial Pacas, MD  melatonin 1 MG TABS tablet Take 2 mg by mouth at bedtime.   Yes [provider]  meloxicam (MOBIC) 7.5 MG tablet TAKE ONE TABLET BY MOUTH ONCE DAILY 12/23/20  Yes Wendie Agreste, MD  montelukast (SINGULAIR) 10 MG tablet Take 1 tablet (10 mg total) by mouth at bedtime. 07/11/20  Yes Wendie Agreste, MD  Multiple Vitamins-Minerals (MULTIVITAMIN WITH MINERALS) tablet Take 1 tablet by mouth daily.   Yes [provider]  NON FORMULARY Take by mouth at bedtime. Calcium 600 mg, Magnesium 300 mg and Vitamin D3   Yes [provider]  rosuvastatin (CRESTOR) 20 MG tablet Take 1 tablet (20 mg total) by mouth daily. 04/30/20  Yes Jerline Pain, MD  Coenzyme Q10 (COQ10) 100 MG CAPS Take 100 mg by mouth daily.    [provider]  Spacer/Aero-Holding Chambers DEVI 1 Device by Does not apply route every 4 (four) hours as needed. 10/31/18   Robyn Haber, MD   Social History   Socioeconomic History   Marital status: Married    Spouse name: [name]   Number of children: Not on file   Years of education: Not on file   Highest education level: Bachelor's degree (e.g., BA, AB, BS)  Occupational History   Occupation: retired    Fish farm manager: RETIRED  Tobacco Use   Smoking status: Former    Packs/day: 1.00    Years: 10.00    Pack years: 10.00    Types: Cigarettes    Quit date: 07/13/1977    Years since quitting: 43.5   Smokeless tobacco: Never  Vaping Use   Vaping Use: Never used  Substance and Sexual Activity   Alcohol use: Yes    Alcohol/week: 2.0 standard drinks    Types: 2 Cans of beer per week    Comment: 2 beers a day   Drug use: No   Sexual activity: Not Currently    Birth control/protection: Post-menopausal,  Abstinence  Other Topics Concern   Not on file  Social History Narrative   Patient worked in [employer]   Social Determinants of Health   Financial Resource Strain: Not on file  Food Insecurity: Not on file  Transportation Needs: Not on file  Physical Activity: Not on file  Stress: Not on file  Social Connections: Not on file  Intimate Partner Violence: Not on file    Review of Systems Per HPI.   Objective:   Vitals:   02/05/21 0800  BP: 126/66  Pulse: 78  Resp: 16  Temp: 98.2 F (36.8 C)  TempSrc: Temporal  SpO2:  99%  Weight: 152 lb 12.8 oz (69.3 kg)  Height: '5\' 9"'$  (1.753 m)     Physical Exam Vitals reviewed.  Constitutional:      General: He is not in acute distress.    Appearance: Normal appearance. He is well-developed.  HENT:     Head: Normocephalic and atraumatic.  Cardiovascular:     Rate and Rhythm: Normal rate.  Pulmonary:     Effort: Pulmonary effort is normal.  Musculoskeletal:     Comments: C-spine, no midline bony tenderness.  Slight decreased right rotation, right lateral flexion versus left.  Minimal spasm right paraspinals, trapezius, levator scapulae. Somewhat difficult reflexes upper extremity but appear to be equal, approximately 1+ at biceps bilaterally, triceps bilaterally, 2+ brachioradialis.  Lumbar spine overall intact range of motion.  No midline bony tenderness.  Locates area of discomfort at the lower mid lumbar spine at the junction of the sacrum.  No focal bony tenderness.  Negative seated straight leg raise.  Able to heel and toe walk without difficulty.  Reflexes 2+ at patella and Achilles bilaterally.  Neurological:     Mental Status: He is alert and oriented to person, place, and time.  Psychiatric:        Mood and Affect: Mood normal.       Assessment & Plan:  Rhody Musleh is a 75 y.o. male . Neck stiffness  Left-sided low back pain without sciatica, unspecified chronicity - Plan: meloxicam (MOBIC) 7.5 MG  tablet  Stable with some improvement with home exercise/stretches.  We will try continuing PT, has had 1 visit so far.  Would recommend meeting with spine specialist if not significantly improved in the next 4 to 6 weeks.  Refilled meloxicam but potential side effects and risks were again discussed.rtc precautions.   Meds ordered this encounter  Medications   meloxicam (MOBIC) 7.5 MG tablet    Sig: Take 1 tablet (7.5 mg total) by mouth daily.    Dispense:  30 tablet    Refill:  1   Patient Instructions  Ok to continue PT for now, meloxicam for now.  Give me an update in the next 4 to 6 weeks at the most, and if not significantly improving would recommend meeting with spine specialist.  Let me know if there are questions sooner and take care.     Signed,   Merri Ray, MD [facility] 02/05/21 8:40 AM

## 2021-02-12 ENCOUNTER — Other Ambulatory Visit: Payer: Self-pay

## 2021-02-12 ENCOUNTER — Encounter: Payer: Self-pay | Admitting: Physical Therapy

## 2021-02-12 ENCOUNTER — Ambulatory Visit (INDEPENDENT_AMBULATORY_CARE_PROVIDER_SITE_OTHER): Payer: Medicare Other | Admitting: Physical Therapy

## 2021-02-12 DIAGNOSIS — M545 Low back pain, unspecified: Secondary | ICD-10-CM

## 2021-02-12 NOTE — Patient Instructions (Signed)
Access Code: [email or web address] URL: [email or web address] Date: 02/12/2021 Prepared by: Lyndee Hensen  Exercises Standard Plank - 1 x daily - 3-4 x weekly - 3 reps - 30 hold Standing Anti-Rotation Press with Anchored Resistance - 1 x daily - 3-4 x weekly - 2 sets - 10 reps Standing Row with Anchored Resistance - 1 x daily - 3-4 x weekly - 2 sets - 10 reps Marching Bridge - 1 x daily - 1-2 sets - 10 reps Mini Squat - 1 x daily - 1-2 sets - 10 reps

## 2021-02-12 NOTE — Therapy (Signed)
[facility] [address] [place], [place], [address] Phone: [phone]   Fax:  [phone]  Physical Therapy Treatment  Patient Details  Name: Anthony Juarez MRN: [id number] Date of Birth: [birth year] Referring Provider (PT): Corliss Parish   Encounter Date: 02/12/2021   PT End of Session - 02/12/21 1444     Visit Number 2    Number of Visits 12    Date for PT Re-Evaluation 03/12/21    Authorization Type Medicare    PT Start Time [id number]    PT Stop Time 1343    PT Time Calculation (min) 38 min    Activity Tolerance Patient tolerated treatment well    Behavior During Therapy [facility] for tasks assessed/performed             Past Medical History:  Diagnosis Date   Allergy    Arthritis    Asthma    childhood   AVM (arteriovenous malformation) brain 06/15/2013   Balanitis    Benign prostatic hypertrophy    Central sleep apnea    Cholecystitis with cholelithiasis    Chronic sinus bradycardia 07/09/2018   Convulsive disorder ([place])    Esophageal reflux 03/28/2015   History of nuclear stress test    Myoview 11/16: EF 60%, normal perfusion, Low Risk   Hyperlipidemia    Prostatitis    Seizures ([place])     Past Surgical History:  Procedure Laterality Date   ELBOW SURGERY  11/11/2000   repair   FRACTURE SURGERY     LEFT ELBOW   TONSILLECTOMY      There were no vitals filed for this visit.   Subjective Assessment - 02/12/21 1442     Subjective Pt states doing well with HEP. Notes pain with prolonged standing activity and walking. He is still taking meloxicam daily.    Currently in Pain? Yes    Pain Location Back    Pain Orientation Right;Left    Pain Descriptors / Indicators Aching    Pain Type Acute pain;Chronic pain    Pain Onset More than a month ago    Pain Frequency Intermittent                               [facility] Adult PT Treatment/Exercise - 02/12/21 0001       Ambulation/Gait   Gait  Comments unremarkable , wearing Altra shoes      Exercises   Exercises Lumbar      Lumbar Exercises: Stretches   Active Hamstring Stretch 3 reps;30 seconds    Active Hamstring Stretch Limitations seated    Single Knee to Chest Stretch 3 reps;30 seconds    Lower Trunk Rotation --    Pelvic Tilt --    Piriformis Stretch 3 reps;30 seconds    Piriformis Stretch Limitations seated only (supine causing grinding in lumbar spine )      Lumbar Exercises: Aerobic   Recumbent Bike L2 x 5 min;      Lumbar Exercises: Standing   Functional Squats 15 reps    Row 20 reps    Theraband Level (Row) Level 4 (Blue)    Other Standing Lumbar Exercises Paloff Press double Green TB x 15 bil;      Lumbar Exercises: Seated   Sit to Stand 10 reps    Sit to Stand Limitations for edcuation on squat mechanics.      Lumbar Exercises: Supine   Bridge 20 reps  Single Leg Bridge 15 reps    Bridge with [employer] Limitations bil;      Lumbar Exercises: Quadruped   Plank 30 sec x 3 on elbows.      Manual Therapy   Manual Therapy Manual Traction    Manual Traction long leg distraction for lumbar pump x 2 min bil;                      PT Short Term Goals - 02/03/21 1557       PT SHORT TERM GOAL #1   Title Pt to be independent with initial HEP    Time 2    Period Weeks    Status New    Target Date 02/12/21               PT Long Term Goals - 02/03/21 1558       PT LONG TERM GOAL #1   Title Pt to be independent with final HEP    Time 6    Period Weeks    Status New    Target Date 03/12/21      PT LONG TERM GOAL #2   Title Pt to report ability for ambulation up to 2 hr, with decreased pain in low back, to 0-2/10 , for improved ability for community and recreation activity.    Time 6    Period Weeks    Status New    Target Date 03/12/21                   Plan - 02/12/21 1348     Clinical Impression Statement Pt with good ability for strengthening exercises  today. Reviewed and performed safe core strengthenig exercises, with education on optimal mechanics. Pt to benefit from f/u visits to continue education on best ther ex for HEP, safey with exercise and functional activity, as well as pain management.    Examination-Activity Limitations Stand;Locomotion Level    Examination-Participation Restrictions [employer];Shop    Stability/Clinical Decision Making Stable/Uncomplicated    Rehab Potential Good    PT Frequency 1x / week    PT Duration 6 weeks    PT Treatment/Interventions ADLs/Self Care Home Management;Electrical Stimulation;Cryotherapy;Iontophoresis '4mg'$ /ml Dexamethasone;Moist Heat;Traction;Ultrasound;Stair training;Gait training;Functional mobility training;Therapeutic exercise;Therapeutic activities;Neuromuscular re-education;Patient/family education;Manual techniques;Passive range of motion;Dry needling;Taping;Spinal Manipulations;Joint Manipulations    PT Home Exercise Plan [id number]    Consulted and Agree with Plan of Care Patient             Patient will benefit from skilled therapeutic intervention in order to improve the following deficits and impairments:  Pain, Decreased mobility, Decreased activity tolerance, Decreased strength  Visit Diagnosis: Acute bilateral low back pain without sciatica     Problem List Patient Active Problem List   Diagnosis Date Noted   Memory deficit 06/27/2020   Chronic sinus bradycardia 07/09/2018   Abnormal bowel movement 07/09/2018   Hyperinflation of lungs 07/09/2018   Non-seasonal allergic rhinitis 07/09/2018   Abnormal finding on MRI of brain 12/04/2016   Benign prostatic hyperplasia with incomplete bladder emptying 11/11/2016   Hyperlipidemia 05/13/2016   Esophageal reflux 03/28/2015   Shortness of breath 03/28/2015   Skin lesion of face 03/28/2015   AVM (arteriovenous malformation) brain 06/15/2013   Seizure disorder ([place]) 01/19/2013   Central sleep apnea 06/10/2012    Epigastric pain 06/06/2012   Gallstones 06/06/2012   Nausea 06/06/2012    Lyndee Hensen, PT, DPT 2:52 PM  02/12/21    [facility]  [place], [place], [address] Phone: [phone]   Fax:  [phone]  Name: Trampis Melhorn MRN: [id number] Date of Birth: July 23, 1945

## 2021-02-19 ENCOUNTER — Ambulatory Visit (INDEPENDENT_AMBULATORY_CARE_PROVIDER_SITE_OTHER): Payer: Medicare Other | Admitting: Physical Therapy

## 2021-02-19 ENCOUNTER — Other Ambulatory Visit: Payer: Self-pay

## 2021-02-19 ENCOUNTER — Encounter: Payer: Self-pay | Admitting: Physical Therapy

## 2021-02-19 DIAGNOSIS — M545 Low back pain, unspecified: Secondary | ICD-10-CM

## 2021-02-19 NOTE — Therapy (Signed)
[facility] [address] [place], [place], [address] Phone: [phone]   Fax:  [phone]  Physical Therapy Treatment/Discharge  Patient Details  Name: Messi Twedt MRN: [id number] Date of Birth: 1946-02-26 Referring Provider (PT): Corliss Parish   Encounter Date: 02/19/2021   PT End of Session - 02/19/21 1346     Visit Number 3    Number of Visits 12    Date for PT Re-Evaluation 03/12/21    Authorization Type Medicare    PT Start Time 1302    PT Stop Time 1340    PT Time Calculation (min) 38 min    Activity Tolerance Patient tolerated treatment well    Behavior During Therapy [facility] for tasks assessed/performed             Past Medical History:  Diagnosis Date   Allergy    Arthritis    Asthma    childhood   AVM (arteriovenous malformation) brain 06/15/2013   Balanitis    Benign prostatic hypertrophy    Central sleep apnea    Cholecystitis with cholelithiasis    Chronic sinus bradycardia 07/09/2018   Convulsive disorder ([place])    Esophageal reflux 03/28/2015   History of nuclear stress test    Myoview 11/16: EF 60%, normal perfusion, Low Risk   Hyperlipidemia    Prostatitis    Seizures ([place])     Past Surgical History:  Procedure Laterality Date   ELBOW SURGERY  11/11/2000   repair   FRACTURE SURGERY     LEFT ELBOW   TONSILLECTOMY      There were no vitals filed for this visit.   Subjective Assessment - 02/19/21 1345     Subjective Pt with no new complaitns. Minimal back pain unless walking longer miles. Has been doing well with HEP.    Currently in Pain? No/denies    Pain Score 0-No pain                               [facility] Adult PT Treatment/Exercise - 02/19/21 0001       Ambulation/Gait   Gait Comments unremarkable , wearing Altra shoes      Exercises   Exercises Lumbar      Lumbar Exercises: Stretches   Active Hamstring Stretch 3 reps;30 seconds    Active Hamstring  Stretch Limitations seated    Single Knee to Chest Stretch --    Pelvic Tilt 20 reps    Piriformis Stretch --    Piriformis Stretch Limitations --      Lumbar Exercises: Aerobic   Recumbent Bike L2 x 7  min;      Lumbar Exercises: Standing   Functional Squats 15 reps    Functional Squats Limitations 20 lb    Row 20 reps    Theraband Level (Row) Level 4 (Blue)    Other Standing Lumbar Exercises Paloff Press double Green TB x 15 bil;      Lumbar Exercises: Seated   Sit to Stand 10 reps    Sit to Stand Limitations SL sit to stand x 10 bi from mat table      Lumbar Exercises: Supine   Bridge 20 reps    Bridge with [employer] Limitations --      Lumbar Exercises: Fayne Mediate --      Manual Therapy   Manual Therapy Manual Traction    Manual Traction long leg distraction for lumbar  pump x 2 min bil;                    PT Education - 02/19/21 1345     Education Details Final HEP reviewed    Person(s) Educated Patient    Methods Explanation;Demonstration;Verbal cues;Handout    Comprehension Verbalized understanding;Verbal cues required;Returned demonstration              PT Short Term Goals - 02/19/21 1346       PT SHORT TERM GOAL #1   Title Pt to be independent with initial HEP    Time 2    Period Weeks    Status Achieved    Target Date 02/12/21               PT Long Term Goals - 02/19/21 1346       PT LONG TERM GOAL #1   Title Pt to be independent with final HEP    Time 6    Period Weeks    Status Achieved      PT LONG TERM GOAL #2   Title Pt to report ability for ambulation up to 2 hr, with decreased pain in low back, to 0-2/10 , for improved ability for community and recreation activity.    Time 6    Period Weeks    Status Achieved                   Plan - 02/19/21 1347     Clinical Impression Statement Pt doing well. He is able to do regular activities, and only has pain at increased milage, around 4 miles of  walking/hiking. We have discussed footwear (pt with updated, cushioned  footwear), and activities to do during hike for pain relief and decompression, can do some of HEP when resting during hike. Pt with good ability for ther ex and strengthening. Final HEP reviewed today. Pt will continue to do HEP for strengthening, and will f/u with MD as needed. Pt ready for d/c at this time.    Examination-Activity Limitations Stand;Locomotion Level    Examination-Participation Restrictions [employer];Shop    Stability/Clinical Decision Making Stable/Uncomplicated    Rehab Potential Good    PT Frequency 1x / week    PT Duration 6 weeks    PT Treatment/Interventions ADLs/Self Care Home Management;Electrical Stimulation;Cryotherapy;Iontophoresis 80m/ml Dexamethasone;Moist Heat;Traction;Ultrasound;Stair training;Gait training;Functional mobility training;Therapeutic exercise;Therapeutic activities;Neuromuscular re-education;Patient/family education;Manual techniques;Passive range of motion;Dry needling;Taping;Spinal Manipulations;Joint Manipulations    PT Home Exercise Plan [email or web address]    Consulted and Agree with Plan of Care Patient             Patient will benefit from skilled therapeutic intervention in order to improve the following deficits and impairments:  Pain, Decreased mobility, Decreased activity tolerance, Decreased strength  Visit Diagnosis: Acute bilateral low back pain without sciatica     Problem List Patient Active Problem List   Diagnosis Date Noted   Memory deficit 06/27/2020   Chronic sinus bradycardia 07/09/2018   Abnormal bowel movement 07/09/2018   Hyperinflation of lungs 07/09/2018   Non-seasonal allergic rhinitis 07/09/2018   Abnormal finding on MRI of brain 12/04/2016   Benign prostatic hyperplasia with incomplete bladder emptying 11/11/2016   Hyperlipidemia 05/13/2016   Esophageal reflux 03/28/2015   Shortness of breath 03/28/2015   Skin lesion of face  03/28/2015   AVM (arteriovenous malformation) brain 06/15/2013   Seizure disorder ([place] 01/19/2013   Central sleep apnea 06/10/2012   Epigastric pain 06/06/2012   Gallstones 06/06/2012  Nausea 06/06/2012    Lyndee Hensen, PT, DPT 1:51 PM  02/19/21    [facility] [place], [place], [address] Phone: [phone]   Fax:  [phone]  Name: Jacobb Alen MRN: [id number] Date of Birth: 1946/05/27  PHYSICAL THERAPY DISCHARGE SUMMARY  Visits from Start of Care: 3 Plan: Patient agrees to discharge.  Patient goals were met. Patient is being discharged due to meeting the stated rehab goals.      Lyndee Hensen, PT, DPT 1:51 PM  02/19/21

## 2021-02-26 ENCOUNTER — Encounter: Payer: Medicare Other | Admitting: Physical Therapy

## 2021-02-28 ENCOUNTER — Encounter: Payer: Self-pay | Admitting: Family Medicine

## 2021-02-28 DIAGNOSIS — M545 Low back pain, unspecified: Secondary | ICD-10-CM

## 2021-02-28 DIAGNOSIS — M5136 Other intervertebral disc degeneration, lumbar region: Secondary | ICD-10-CM

## 2021-03-06 ENCOUNTER — Encounter: Payer: Medicare Other | Admitting: Physical Therapy

## 2021-03-10 ENCOUNTER — Other Ambulatory Visit: Payer: Self-pay | Admitting: Family Medicine

## 2021-03-12 ENCOUNTER — Encounter: Payer: Medicare Other | Admitting: Physical Therapy

## 2021-04-28 ENCOUNTER — Other Ambulatory Visit: Payer: Self-pay | Admitting: Family Medicine

## 2021-04-28 ENCOUNTER — Other Ambulatory Visit (HOSPITAL_BASED_OUTPATIENT_CLINIC_OR_DEPARTMENT_OTHER): Payer: Self-pay | Admitting: Cardiology

## 2021-05-01 DIAGNOSIS — M47816 Spondylosis without myelopathy or radiculopathy, lumbar region: Secondary | ICD-10-CM | POA: Diagnosis not present

## 2021-05-05 ENCOUNTER — Other Ambulatory Visit: Payer: Self-pay

## 2021-05-05 ENCOUNTER — Ambulatory Visit (INDEPENDENT_AMBULATORY_CARE_PROVIDER_SITE_OTHER): Payer: Medicare Other | Admitting: Cardiology

## 2021-05-05 ENCOUNTER — Encounter (HOSPITAL_BASED_OUTPATIENT_CLINIC_OR_DEPARTMENT_OTHER): Payer: Self-pay | Admitting: Cardiology

## 2021-05-05 VITALS — BP 120/70 | HR 52 | Ht 69.0 in | Wt 151.7 lb

## 2021-05-05 DIAGNOSIS — E782 Mixed hyperlipidemia: Secondary | ICD-10-CM | POA: Diagnosis not present

## 2021-05-05 DIAGNOSIS — Z79899 Other long term (current) drug therapy: Secondary | ICD-10-CM | POA: Diagnosis not present

## 2021-05-05 DIAGNOSIS — Z8249 Family history of ischemic heart disease and other diseases of the circulatory system: Secondary | ICD-10-CM | POA: Diagnosis not present

## 2021-05-05 DIAGNOSIS — R001 Bradycardia, unspecified: Secondary | ICD-10-CM | POA: Diagnosis not present

## 2021-05-05 MED ORDER — EZETIMIBE 10 MG PO TABS: 10.0000 mg | ORAL_TABLET | Freq: Every day | ORAL | 3 refills | Status: DC

## 2021-05-05 NOTE — Progress Notes (Signed)
Cardiology Office Note:    Date:  05/05/2021   ID:  Anthony Juarez, DOB 1946-02-25, MRN [id number]  PCP:  Wendie Agreste, MD  [facility] Cardiologist:  Candee Furbish, MD  [facility] Electrophysiologist:  None   Referring MD: Wendie Agreste, MD   History of Present Illness:    Anthony Juarez is a 75 y.o. male here for the follow-up of bradycardia.  Prior ER visit in 2020 with chest pain left-sided pressure when standing up in the afternoon relieved with rest.  His father had CABG at age 27.  He had been diagnosed with GERD in the past  Previously 16 miles a week hiking. 750 mile goal.  Enjoys the trails here in town.  Prior LDL between 75 and 80.  On high intensity Crestor.  Today: Overall he is feeling good. Occasionally he feels palpitations, especially while in bed at night.  Exercises regularly by hiking and walking. This week, he hiked about 20 miles.  He self-discontinued his aspirin due to easily bruising, but is amenable to re-starting.  Also, he endorses a degenerative disc, and has been on Meloxicam for 6 months.  Enjoyed a camping trip, hiking out in [place], [place], [place]  He denies any chest pain, or shortness of breath. No lightheadedness, headaches, syncope, orthopnea, PND, lower extremity edema or exertional symptoms.  Used to work as a Ship broker.   Past Medical History:  Diagnosis Date   Allergy    Arthritis    Asthma    childhood   AVM (arteriovenous malformation) brain 06/15/2013   Balanitis    Benign prostatic hypertrophy    Central sleep apnea    Cholecystitis with cholelithiasis    Chronic sinus bradycardia 07/09/2018   Convulsive disorder ([place])    Esophageal reflux 03/28/2015   History of nuclear stress test    Myoview 11/16: EF 60%, normal perfusion, Low Risk   Hyperlipidemia    Prostatitis    Seizures ([place])     Past Surgical History:  Procedure Laterality Date   ELBOW SURGERY  11/11/2000   repair    FRACTURE SURGERY     LEFT ELBOW   TONSILLECTOMY      Current Medications: Current Meds  Medication Sig   albuterol (PROVENTIL HFA;VENTOLIN HFA) 108 (90 Base) MCG/ACT inhaler Inhale 2 puffs into the lungs every 4 (four) hours as needed for wheezing or shortness of breath (cough, shortness of breath or wheezing.).   doxazosin (CARDURA) 2 MG tablet TAKE ONE TABLET ONCE DAILY   ezetimibe (ZETIA) 10 MG tablet Take 1 tablet (10 mg total) by mouth daily.   finasteride (PROSCAR) 5 MG tablet TAKE 1 TABLET ONCE DAILY.   fluticasone (FLONASE) 50 MCG/ACT nasal spray USE 1 SPRAY IN EACH NOSTRIL TWICE A DAY.   LamoTRIgine 200 MG TB24 24 hour tablet Take 1 tablet (200 mg total) by mouth at bedtime.   melatonin 1 MG TABS tablet Take 2 mg by mouth at bedtime.   meloxicam (MOBIC) 7.5 MG tablet Take 1 tablet (7.5 mg total) by mouth daily.   montelukast (SINGULAIR) 10 MG tablet Take 1 tablet (10 mg total) by mouth at bedtime.   NON FORMULARY Take by mouth at bedtime. Calcium 600 mg, Magnesium 300 mg and Vitamin D3   rosuvastatin (CRESTOR) 20 MG tablet Take 1 tablet (20 mg total) by mouth daily. Please make overdue appt with Dr Marlou Porch for future refills . Thank you 1st attempt     Allergies:   Penicillins  Social History   Socioeconomic History   Marital status: Married    Spouse name: [name]   Number of children: Not on file   Years of education: Not on file   Highest education level: Bachelor's degree (e.g., BA, AB, BS)  Occupational History   Occupation: retired    Fish farm manager: RETIRED  Tobacco Use   Smoking status: Former    Packs/day: 1.00    Years: 10.00    Pack years: 10.00    Types: Cigarettes    Quit date: 07/13/1977    Years since quitting: 43.8   Smokeless tobacco: Never  Vaping Use   Vaping Use: Never used  Substance and Sexual Activity   Alcohol use: Yes    Alcohol/week: 2.0 standard drinks    Types: 2 Cans of beer per week    Comment: 2 beers a day   Drug use: No   Sexual  activity: Not Currently    Birth control/protection: Post-menopausal, Abstinence  Other Topics Concern   Not on file  Social History Narrative   Patient worked in [employer].   Social Determinants of Health   Financial Resource Strain: Not on file  Food Insecurity: Not on file  Transportation Needs: Not on file  Physical Activity: Not on file  Stress: Not on file  Social Connections: Not on file     Family History: The patient's family history includes Cancer in his father; Dementia in his mother; Heart disease in his father and maternal grandfather.  ROS:   Please see the history of present illness.    (+) Palpitations All other systems reviewed and are negative.  EKGs/Labs/Other Studies Reviewed:    Coronary Calcium Score 01/17/2019: FINDINGS: Non-cardiac: See separate report from [facility].   Ascending Aorta: Normal size, no calcifications.   Pericardium: Normal.   Coronary arteries: Normal origin.   IMPRESSION: Coronary calcium score of 166. This was 57 percentile for age and sex matched control.  ADDENDUM: OVER-READ INTERPRETATION  CT CHEST  Vascular: Mild atherosclerotic calcifications of the descending thoracic aorta.   Mediastinum: No suspicious lymphadenopathy.   Lungs:. No suspicious pulmonary nodules. No focal consolidation. No pleural effusion or visualized pneumothorax.   Upper abdomen: Left hepatic cysts measuring up to 2.1 cm, benign.   Osseous: No focal osseous lesions.   IMPRESSION: No significant extracardiac findings.  Nuclear Stress Test 05/28/2015: The left ventricular ejection fraction is normal (55-65%). Nuclear stress EF: 60%. There was no ST segment deviation noted during stress. The study is normal. This is a low risk study.  ETT 05/22/2015: Blood pressure demonstrated a hypertensive response to exercise. ST segment depression was noted during stress.   Good exercise capacity.  No chest pain.  He did note  some SOB at peak exercise.  Hypertensive BP response to exercise. There was borderline inf-lat ST depression at peak exercise (1 mm or less). Will arrange [employer].  FU with Dr. Candee Furbish as planned.  Echo 05/22/2015: - Left ventricle: The cavity size was normal. Wall thickness was    normal. Systolic function was normal. The estimated ejection    fraction was in the range of 55% to 60%. Wall motion was normal;    there were no regional wall motion abnormalities. Left    ventricular diastolic function parameters were normal. Normal    strain pattern, GLS -23.2%.  - Aortic valve: There was no stenosis.  - Mitral valve: Mildly calcified annulus. There was trivial    regurgitation.  - Right ventricle: The  cavity size was normal. Systolic function    was normal.  - Pulmonary arteries: PA peak pressure: 28 mm Hg (S).  - Inferior vena cava: The vessel was normal in size. The    respirophasic diameter changes were in the normal range (= 50%),    consistent with normal central venous pressure.  - Pericardium, extracardiac: A trivial pericardial effusion was    identified.   Impressions:  - Normal study.   EKG:  EKG is personally reviewed and interpreted. 05/05/2021: Sinus bradycardia. Rate 52 bpm. 04/30/2020: sinus bradycardia 55 with PACs  Recent Labs: 07/11/2020: ALT 12; BUN 15; Creatinine, Ser 0.97; Hemoglobin 14.8; Platelets 227; Potassium 4.5; Sodium 141 07/16/2020: TSH 2.680   Recent Lipid Panel    Component Value Date/Time   CHOL 196 07/16/2020 1115   TRIG 80 07/16/2020 1115   HDL 92 07/16/2020 1115   CHOLHDL 2.1 07/16/2020 1115   CHOLHDL 2.7 05/14/2016 0900   VLDL 18 05/14/2016 0900   LDLCALC 90 07/16/2020 1115      Physical Exam:    VS:  BP 120/70   Pulse (!) 52   Ht 5\' 9"  (1.753 m)   Wt 151 lb 11.2 oz (68.8 kg)   SpO2 96%   BMI 22.40 kg/m     Wt Readings from Last 3 Encounters:  05/05/21 151 lb 11.2 oz (68.8 kg)  02/05/21 152 lb 12.8 oz (69.3 kg)   09/16/20 162 lb (73.5 kg)     GEN:  Well nourished, well developed in no acute distress HEENT: Normal NECK: No JVD; No carotid bruits LYMPHATICS: No lymphadenopathy CARDIAC: RRR, no murmurs, rubs, gallops RESPIRATORY:  Clear to auscultation without rales, wheezing or rhonchi  ABDOMEN: Soft, non-tender, non-distended MUSCULOSKELETAL:  No edema; No deformity  SKIN: Warm and dry NEUROLOGIC:  Alert and oriented x 3 PSYCHIATRIC:  Normal affect   ASSESSMENT:    1. Medication management   2. Chronic sinus bradycardia   3. Family history of early CAD   57. Mixed hyperlipidemia     PLAN:    In order of problems listed above: Chronic sinus bradycardia Stable heart rate in the 50s, PACs noted.  No high risk symptoms such as syncope.  Continue to avoid medication such as beta-blockers or calcium channel blockers like diltiazem.  Family history of early CAD Father had bypass surgery at age 68 continue with secondary risk factor prevention.  He had a calcium score in 2020 that was 152, 62 percentile.  On high intensity statin.  Aspirin 81 mg.  Mixed hyperlipidemia Continue with Crestor 20 mg a day.  No myalgias.  Doing well.  LDL at goal should be less than 70.  We will go ahead and add Zetia 10 mg to his Crestor 20 mg.  We will recheck lipid panel in 3 months with ALT.  Last LDL was 90.  We are shooting for a goal less than 70.  Follow-up:  12 months.  Medication Adjustments/Labs and Tests Ordered: Current medicines are reviewed at length with the patient today.  Concerns regarding medicines are outlined above.  Orders Placed This Encounter  Procedures   ALT   Lipid panel   EKG 12-Lead    Meds ordered this encounter  Medications   ezetimibe (ZETIA) 10 MG tablet    Sig: Take 1 tablet (10 mg total) by mouth daily.    Dispense:  90 tablet    Refill:  3     Patient Instructions  Medication Instructions:  Please start  Zetia 10 mg a day. Continue all other medications as  listed.  *If you need a refill on your cardiac medications before your next appointment, please call your pharmacy*  Lab Work: Please have blood work in 3 months (Lipid/ALT)  If you have labs (blood work) drawn today and your tests are completely normal, you will receive your results only by: [place] (if you have MyChart) OR A paper copy in the mail If you have any lab test that is abnormal or we need to change your treatment, we will call you to review the results.  Follow-Up: At  [facility], you and your health needs are our priority.  As part of our continuing mission to provide you with exceptional heart care, we have created designated Provider Care Teams.  These Care Teams include your primary Cardiologist (physician) and Advanced Practice Providers (APPs -  Physician Assistants and Nurse Practitioners) who all work together to provide you with the care you need, when you need it.  We recommend signing up for the patient portal called "MyChart".  Sign up information is provided on this After Visit Summary.  MyChart is used to connect with patients for Virtual Visits (Telemedicine).  Patients are able to view lab/test results, encounter notes, upcoming appointments, etc.  Non-urgent messages can be sent to your provider as well.   To learn more about what you can do with MyChart, go to [email or web address].    Your next appointment:   1 year(s)  The format for your next appointment:   In Person  Provider:   Candee Furbish, MD   Thank you for choosing [facility]!!     I,Mathew Stumpf,acting as a scribe for Candee Furbish, MD.,have documented all relevant documentation on the behalf of Candee Furbish, MD,as directed by  Candee Furbish, MD while in the presence of Candee Furbish, MD.  I, Candee Furbish, MD, have reviewed all documentation for this visit. The documentation on 05/05/21 for the exam, diagnosis, procedures, and orders are all accurate and complete.    Signed, Candee Furbish, MD  05/05/2021 9:56 AM    [facility]

## 2021-05-05 NOTE — Assessment & Plan Note (Signed)
Stable heart rate in the 50s, PACs noted.  No high risk symptoms such as syncope.  Continue to avoid medication such as beta-blockers or calcium channel blockers like diltiazem.

## 2021-05-05 NOTE — Assessment & Plan Note (Signed)
Father had bypass surgery at age 73 continue with secondary risk factor prevention.  He had a calcium score in 2020 that was 152, 62 percentile.  On high intensity statin.  Aspirin 81 mg.

## 2021-05-05 NOTE — Patient Instructions (Signed)
Medication Instructions:  Please start Zetia 10 mg a day. Continue all other medications as listed.  *If you need a refill on your cardiac medications before your next appointment, please call your pharmacy*  Lab Work: Please have blood work in 3 months (Lipid/ALT)  If you have labs (blood work) drawn today and your tests are completely normal, you will receive your results only by: [place] (if you have MyChart) OR A paper copy in the mail If you have any lab test that is abnormal or we need to change your treatment, we will call you to review the results.  Follow-Up: At [facility], you and your health needs are our priority.  As part of our continuing mission to provide you with exceptional heart care, we have created designated Provider Care Teams.  These Care Teams include your primary Cardiologist (physician) and Advanced Practice Providers (APPs -  Physician Assistants and Nurse Practitioners) who all work together to provide you with the care you need, when you need it.  We recommend signing up for the patient portal called "MyChart".  Sign up information is provided on this After Visit Summary.  MyChart is used to connect with patients for Virtual Visits (Telemedicine).  Patients are able to view lab/test results, encounter notes, upcoming appointments, etc.  Non-urgent messages can be sent to your provider as well.   To learn more about what you can do with MyChart, go to [email or web address].    Your next appointment:   1 year(s)  The format for your next appointment:   In Person  Provider:   Candee Furbish, MD   Thank you for choosing [facility]!!

## 2021-05-05 NOTE — Assessment & Plan Note (Addendum)
Continue with Crestor 20 mg a day.  No myalgias.  Doing well.  LDL at goal should be less than 70.  We will go ahead and add Zetia 10 mg to his Crestor 20 mg.  We will recheck lipid panel in 3 months with ALT.  Last LDL was 90.  We are shooting for a goal less than 70.

## 2021-05-19 ENCOUNTER — Ambulatory Visit
Admission: EM | Admit: 2021-05-19 | Discharge: 2021-05-19 | Disposition: A | Discharge status: Home / Self Care | Payer: Medicare Other

## 2021-05-19 ENCOUNTER — Other Ambulatory Visit: Payer: Self-pay

## 2021-05-19 DIAGNOSIS — R140 Abdominal distension (gaseous): Secondary | ICD-10-CM | POA: Diagnosis not present

## 2021-05-19 DIAGNOSIS — Z20828 Contact with and (suspected) exposure to other viral communicable diseases: Secondary | ICD-10-CM | POA: Diagnosis not present

## 2021-05-19 DIAGNOSIS — R110 Nausea: Secondary | ICD-10-CM

## 2021-05-19 NOTE — ED Notes (Signed)
Patient approached desk, states he has another appointment he needs to go to, will d/c.

## 2021-06-02 ENCOUNTER — Other Ambulatory Visit (HOSPITAL_BASED_OUTPATIENT_CLINIC_OR_DEPARTMENT_OTHER): Payer: Self-pay | Admitting: Cardiology

## 2021-06-16 ENCOUNTER — Telehealth: Payer: Self-pay | Admitting: Neurology

## 2021-06-16 NOTE — Telephone Encounter (Signed)
Office closing early 12/15- LVM and sent mychart msg informing pt.

## 2021-06-18 ENCOUNTER — Other Ambulatory Visit: Payer: Self-pay | Admitting: Family Medicine

## 2021-06-26 ENCOUNTER — Ambulatory Visit: Payer: Medicare Other | Admitting: Neurology

## 2021-06-26 ENCOUNTER — Other Ambulatory Visit: Payer: Self-pay | Admitting: Family Medicine

## 2021-07-16 DIAGNOSIS — H2513 Age-related nuclear cataract, bilateral: Secondary | ICD-10-CM | POA: Diagnosis not present

## 2021-07-23 ENCOUNTER — Ambulatory Visit (INDEPENDENT_AMBULATORY_CARE_PROVIDER_SITE_OTHER): Payer: Medicare Other | Admitting: Family Medicine

## 2021-07-23 ENCOUNTER — Encounter: Payer: Self-pay | Admitting: Family Medicine

## 2021-07-23 VITALS — BP 128/66 | HR 76 | Temp 98.3°F | Resp 16 | Ht 69.0 in | Wt 156.8 lb

## 2021-07-23 DIAGNOSIS — Z23 Encounter for immunization: Secondary | ICD-10-CM | POA: Diagnosis not present

## 2021-07-23 DIAGNOSIS — Z0000 Encounter for general adult medical examination without abnormal findings: Secondary | ICD-10-CM | POA: Diagnosis not present

## 2021-07-23 DIAGNOSIS — Z131 Encounter for screening for diabetes mellitus: Secondary | ICD-10-CM

## 2021-07-23 DIAGNOSIS — Z125 Encounter for screening for malignant neoplasm of prostate: Secondary | ICD-10-CM

## 2021-07-23 DIAGNOSIS — E782 Mixed hyperlipidemia: Secondary | ICD-10-CM | POA: Diagnosis not present

## 2021-07-23 NOTE — Patient Instructions (Addendum)
I do recommend appointment with dermatology for skin cancer screening.  No problem with taking CoQ 10.  If any concerns on labs I will let you know.  I would recommend bivalent booster at the pharmacy as well as Shingrix.  No med changes at this time.  Keep follow-up with your specialist as planned.  If any new concerns or items we are unable to discuss today, please follow-up at separate visit.  Take care.   Preventive Care 101 Years and Older, Male Preventive care refers to lifestyle choices and visits with your health care provider that can promote health and wellness. Preventive care visits are also called wellness exams. What can I expect for my preventive care visit? Counseling During your preventive care visit, your health care provider may ask about your: Medical history, including: Past medical problems. Family medical history. History of falls. Current health, including: Emotional well-being. Home life and relationship well-being. Sexual activity. Memory and ability to understand (cognition). Lifestyle, including: Alcohol, nicotine or tobacco, and drug use. Access to firearms. Diet, exercise, and sleep habits. Work and work Statistician. Sunscreen use. Safety issues such as seatbelt and bike helmet use. Physical exam Your health care provider will check your: Height and weight. These may be used to calculate your BMI (body mass index). BMI is a measurement that tells if you are at a healthy weight. Waist circumference. This measures the distance around your waistline. This measurement also tells if you are at a healthy weight and may help predict your risk of certain diseases, such as type 2 diabetes and high blood pressure. Heart rate and blood pressure. Body temperature. Skin for abnormal spots. What immunizations do I need? Vaccines are usually given at various ages, according to a schedule. Your health care provider will recommend vaccines for you based on your age,  medical history, and lifestyle or other factors, such as travel or where you work. What tests do I need? Screening Your health care provider may recommend screening tests for certain conditions. This may include: Lipid and cholesterol levels. Diabetes screening. This is done by checking your blood sugar (glucose) after you have not eaten for a while (fasting). Hepatitis C test. Hepatitis B test. HIV (human immunodeficiency virus) test. STI (sexually transmitted infection) testing, if you are at risk. Lung cancer screening. Colorectal cancer screening. Prostate cancer screening. Abdominal aortic aneurysm (AAA) screening. You may need this if you are a current or former smoker. Talk with your health care provider about your test results, treatment options, and if necessary, the need for more tests. Follow these instructions at home: Eating and drinking  Eat a diet that includes fresh fruits and vegetables, whole grains, lean protein, and low-fat dairy products. Limit your intake of foods with high amounts of sugar, saturated fats, and salt. Take vitamin and mineral supplements as recommended by your health care provider. Do not drink alcohol if your health care provider tells you not to drink. If you drink alcohol: Limit how much you have to 0-2 drinks a day. Know how much alcohol is in your drink. In the [place] one drink equals one 12 oz bottle of beer (355 mL), one 5 oz glass of wine (148 mL), or one 1 oz glass of hard liquor (44 mL). Lifestyle Brush your teeth every morning and night with fluoride toothpaste. Floss one time each day. Exercise for at least 30 minutes 5 or more days each week. Do not use any products that contain nicotine or tobacco. These products include cigarettes,  chewing tobacco, and vaping devices, such as e-cigarettes. If you need help quitting, ask your health care provider. Do not use drugs. If you are sexually active, practice safe sex. Use a condom or other  form of protection to prevent STIs. Take aspirin only as told by your health care provider. Make sure that you understand how much to take and what form to take. Work with your health care provider to find out whether it is safe and beneficial for you to take aspirin daily. Ask your health care provider if you need to take a cholesterol-lowering medicine (statin). Find healthy ways to manage stress, such as: Meditation, yoga, or listening to music. Journaling. Talking to a trusted person. Spending time with friends and family. Safety Always wear your seat belt while driving or riding in a vehicle. Do not drive: If you have been drinking alcohol. Do not ride with someone who has been drinking. When you are tired or distracted. While texting. If you have been using any mind-altering substances or drugs. Wear a helmet and other protective equipment during sports activities. If you have firearms in your house, make sure you follow all gun safety procedures. Minimize exposure to UV radiation to reduce your risk of skin cancer. What's next? Visit your health care provider once a year for an annual wellness visit. Ask your health care provider how often you should have your eyes and teeth checked. Stay up to date on all vaccines. This information is not intended to replace advice given to you by your health care provider. Make sure you discuss any questions you have with your health care provider. Document Revised: 12/25/2020 Document Reviewed: 12/25/2020 Elsevier Patient Education  [place].

## 2021-07-23 NOTE — Progress Notes (Signed)
Subjective:  Patient ID: Anthony Juarez, male    DOB: [birth year]  Age: 76 y.o. MRN: [id number]  CC:  Chief Complaint  Patient presents with   Annual Exam    Pt reports here for annual today, does want to know if he should add a CoQ10 vitamin to aid his statin as he read statins work less as you age, would like Lab work tomorrow for fasting     HPI Anthony Juarez presents for  Annual wellness exam Care team:  primary care provider me Cardiology Dr. Marlou Porch Gastroenterology Dr. Benson [place] Neurology Dr. Krista Blue Ophthalmology Dr. [facility]  Hyperlipidemia: Crestor 20 mg daily, Zetia 10 mg daily.   Followed by cardiology.  Family history of early CAD.  Restarted aspirin on previous cardiology visit  On ASA QOD - no new bleeding, no hematuria. Coronary calcium score in 2020 with reading of 152, 62nd percentile. Chronic sinus bradycardia.  Avoidance of beta-blockers, CCB's. Asking about co-Q10 - on past few days. Sore joints with hiking.   Fall screening Fall Risk  07/23/2021 02/05/2021 09/16/2020 07/16/2020 05/15/2020  Falls in the past year? 0 0 0 0 0  Number falls in past yr: 0 0 - 0 0  Injury with Fall? 0 0 - 0 0  Comment - - - - -  Risk for fall due to : No Fall Risks No Fall Risks - - -  Follow up Falls evaluation completed Falls evaluation completed Falls evaluation completed Falls evaluation completed Falls evaluation completed   Lighting in home: adequate.  Loose rugs/carpets/pets: none usually. Pet sitting temporarily.  Stairs:none.  Grab bars in bathroom:none.  Timed up and go:8 seconds, normal gait, no instability.   Depression Screening: Depression screen [facility] 2/9 07/23/2021 02/05/2021 09/16/2020 07/16/2020 05/15/2020  Decreased Interest 0 0 0 0 0  Down, Depressed, Hopeless 0 0 0 0 0  PHQ - 2 Score 0 0 0 0 0  Altered sleeping 0 - - - -  Tired, decreased energy 1 - - - -  Change in appetite 0 - - - -  Feeling bad or failure about yourself  0 - - - -   Trouble concentrating 0 - - - -  Moving slowly or fidgety/restless 0 - - - -  Suicidal thoughts 0 - - - -  PHQ-9 Score 1 - - - -    Cancer Screening: Colonoscopy in 08/24/2018. Dr Benson [place].  Lab Results  Component Value Date   PSA1 0.5 07/16/2020   PSA1 1.0 07/13/2019   PSA1 0.5 07/08/2018   PSA 0.4 05/14/2016   PSA 0.8 04/11/2016   PSA 0.90 03/24/2015  The natural history of prostate cancer and ongoing controversy regarding screening and potential treatment outcomes of prostate cancer has been discussed with the patient. The meaning of a false positive PSA and a false negative PSA has been discussed. He indicates understanding of the limitations of this screening test and wishes to NOT proceed with screening PSA testing. Skin cancer screen 3 years ago - will call to schedule.  Outside a lot - wears sunblock.   Immunization History  Administered Date(s) Administered   DTaP 01/15/2011   Fluad Quad(high Dose 65+) 04/20/2019, 04/25/2020, 08/07/2020, 07/23/2021   Influenza Whole 05/22/2011, 04/12/2012   Influenza, High Dose Seasonal PF 05/03/2018   Influenza,inj,Quad PF,6+ Mos 03/28/2015, 05/14/2016, 05/17/2017   PFIZER(Purple Top)SARS-COV-2 Vaccination 09/03/2019, 09/27/2019, 04/27/2020, 01/23/2021   Pneumococcal Conjugate-13 03/28/2015   Pneumococcal Polysaccharide-23 01/12/2014   Td 05/14/2016   Zoster,  Live 01/29/2014  Bivalent booster: recommended. Shingrix recommended.   Functional Status Survey: Is the patient deaf or have difficulty hearing?: No Does the patient have difficulty seeing, even when wearing glasses/contacts?: No Does the patient have difficulty concentrating, remembering, or making decisions?: Yes (some short term, pt is working on) Does the patient have difficulty walking or climbing stairs?: No Does the patient have difficulty dressing or bathing?: No Does the patient have difficulty doing errands alone such as visiting a doctor's office or shopping?:  No  Short term memory issues. Discussed with neuro in past. Delayed recall, short term memory. Neuro eval in few weeks? Plan on repeat [place].   Memory Screen: 6CIT Screen 07/23/2021 07/13/2019 07/08/2018 05/17/2017  What Year? 0 points 0 points 0 points 0 points  What month? 0 points 0 points 0 points 0 points  What time? 0 points 0 points 0 points 0 points  Count back from 20 0 points 0 points 0 points 0 points  Months in reverse 0 points 0 points 0 points 0 points  Repeat phrase 0 points 0 points 0 points 2 points  Total Score 0 0 0 2    Alcohol Screening: [place] Visit from 07/23/2021 in [place]  AUDIT-C Score 0     2 beers per day. No change in intake.   Tobacco: None.   Vision Screening   Right eye Left eye Both eyes  Without correction     With correction 20/20-1 20/20 20/20  Optho/optometry: wears glasses, appt last week. Macular degeneration.   Dental: Every 3 months with periodontist as well.   Exercise: 1010mi hiking in past year. 2-3 times hiking per week. Trails, mountains.   Advanced Directives:  Has living will, HCPOA - no changes needed.   Flonase for allergies working well.    Lab Results  Component Value Date   CHOL 196 07/16/2020   HDL 92 07/16/2020   LDLCALC 90 07/16/2020   TRIG 80 07/16/2020   CHOLHDL 2.1 07/16/2020   Lab Results  Component Value Date   ALT 12 07/11/2020   AST 26 07/11/2020   ALKPHOS 62 07/11/2020   BILITOT 0.5 07/11/2020   History of BPH: Treated with finasteride, doxazosin.  Previously evaluated by Dr. Gloriann Loan with urology for chronic microscopic hematuria. No recent change in urination. Nocturia 2/night. No hematuria.  Lab Results  Component Value Date   PSA1 0.5 07/16/2020   PSA1 1.0 07/13/2019   PSA1 0.5 07/08/2018   PSA 0.4 05/14/2016   PSA 0.8 04/11/2016   PSA 0.90 03/24/2015    Seizure disorder, Treated by neurology with Lamictal 200 mg daily.   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  No change from 2015-2017 imaging. Neurology appointment scheduled in December, rescheduled for April.    History Patient Active Problem List   Diagnosis Date Noted   Family history of early CAD 05/05/2021   Memory deficit 06/27/2020   Chronic sinus bradycardia 07/09/2018   Abnormal bowel movement 07/09/2018   Hyperinflation of lungs 07/09/2018   Non-seasonal allergic rhinitis 07/09/2018   Abnormal finding on MRI of brain 12/04/2016   Benign prostatic hyperplasia with incomplete bladder emptying 11/11/2016   Mixed hyperlipidemia 05/13/2016   Esophageal reflux 03/28/2015   Shortness of breath 03/28/2015   Skin lesion of face 03/28/2015   AVM (arteriovenous malformation) brain 06/15/2013   Seizure disorder ([facility]) 01/19/2013   Central sleep apnea 06/10/2012   Epigastric pain 06/06/2012   Gallstones 06/06/2012  Nausea 06/06/2012   Past Medical History:  Diagnosis Date   Allergy    Arthritis    Asthma    childhood   AVM (arteriovenous malformation) brain 06/15/2013   Balanitis    Benign prostatic hypertrophy    Central sleep apnea    Cholecystitis with cholelithiasis    Chronic sinus bradycardia 07/09/2018   Convulsive disorder ([place])    Esophageal reflux 03/28/2015   History of nuclear stress test    Myoview 11/16: EF 60%, normal perfusion, Low Risk   Hyperlipidemia    Prostatitis    Seizures ([place])    Past Surgical History:  Procedure Laterality Date   ELBOW SURGERY  11/11/2000   repair   FRACTURE SURGERY     LEFT ELBOW   TONSILLECTOMY     Allergies  Allergen Reactions   Penicillins Rash    Did it involve swelling of the face/tongue/throat, SOB, or low BP? No Did it involve sudden or severe rash/hives, skin peeling, or any reaction on the inside of your mouth or nose? Yes Did you need to seek medical attention at a hospital or doctor's office? No When did it last happen?  Decades ago. If all above answers are  NO, may proceed with cephalosporin use.    Prior to Admission medications   Medication Sig Start Date End Date Taking? Authorizing Provider  albuterol (PROVENTIL HFA;VENTOLIN HFA) 108 (90 Base) MCG/ACT inhaler Inhale 2 puffs into the lungs every 4 (four) hours as needed for wheezing or shortness of breath (cough, shortness of breath or wheezing.). 06/01/17  Yes Shawnee Knapp, MD  aspirin EC 81 MG tablet Take 81 mg by mouth 2 (two) times a week. Tues and Sat   Yes [provider]  doxazosin (CARDURA) 2 MG tablet TAKE ONE TABLET ONCE DAILY 04/29/21  Yes Wendie Agreste, MD  ezetimibe (ZETIA) 10 MG tablet Take 1 tablet (10 mg total) by mouth daily. 05/05/21  Yes Jerline Pain, MD  finasteride (PROSCAR) 5 MG tablet TAKE 1 TABLET ONCE DAILY. 06/18/21  Yes Wendie Agreste, MD  fluticasone [facility]) 50 MCG/ACT nasal spray USE 1 SPRAY IN EACH NOSTRIL TWICE A DAY. 09/02/20  Yes Wendie Agreste, MD  LamoTRIgine 200 MG TB24 24 hour tablet Take 1 tablet (200 mg total) by mouth at bedtime. 06/27/20  Yes Marcial Pacas, MD  meloxicam (MOBIC) 7.5 MG tablet Take 1 tablet (7.5 mg total) by mouth daily. 02/05/21  Yes Wendie Agreste, MD  montelukast (SINGULAIR) 10 MG tablet TAKE ONE TABLET AT BEDTIME. 06/26/21  Yes Wendie Agreste, MD  Multiple Vitamins-Minerals (MULTIVITAMIN WITH MINERALS) tablet Take 1 tablet by mouth daily.   Yes [provider]  rosuvastatin (CRESTOR) 20 MG tablet Take 1 tablet (20 mg total) by mouth daily. 06/03/21  Yes Jerline Pain, MD  melatonin 1 MG TABS tablet Take 2 mg by mouth at bedtime.    [provider]  NON FORMULARY Take by mouth at bedtime. Calcium 600 mg, Magnesium 300 mg and Vitamin D3 Patient not taking: Reported on 07/23/2021    [provider]   Social History   Socioeconomic History   Marital status: Married    Spouse name: [name]   Number of children: Not on file   Years of education: Not on file   Highest education level:  Bachelor's degree (e.g., BA, AB, BS)  Occupational History   Occupation: retired    Fish farm manager: RETIRED  Tobacco Use   Smoking status: Former  Packs/day: 1.00    Years: 10.00    Pack years: 10.00    Types: Cigarettes    Quit date: 07/13/1977    Years since quitting: 44.0   Smokeless tobacco: Never  Vaping Use   Vaping Use: Never used  Substance and Sexual Activity   Alcohol use: Yes    Alcohol/week: 2.0 standard drinks    Types: 2 Cans of beer per week    Comment: 2 beers a day   Drug use: No   Sexual activity: Not Currently    Birth control/protection: Post-menopausal, Abstinence  Other Topics Concern   Not on file  Social History Narrative   Patient worked in [employer].   Social Determinants of Health   Financial Resource Strain: Not on file  Food Insecurity: Not on file  Transportation Needs: Not on file  Physical Activity: Not on file  Stress: Not on file  Social Connections: Not on file  Intimate Partner Violence: Not on file    Review of Systems 13 point review of systems per patient health survey noted.  Negative other than as indicated above or in HPI.    Objective:   Vitals:   07/23/21 1357  BP: 128/66  Pulse: 76  Resp: 16  Temp: 98.3 F (36.8 C)  TempSrc: Temporal  SpO2: 97%  Weight: 156 lb 12.8 oz (71.1 kg)  Height: 5\' 9"  (1.753 m)     Physical Exam Vitals reviewed.  Constitutional:      Appearance: He is well-developed.  HENT:     Head: Normocephalic and atraumatic.     Right Ear: External ear normal.     Left Ear: External ear normal.  Eyes:     Conjunctiva/sclera: Conjunctivae normal.     Pupils: Pupils are equal, round, and reactive to light.  Neck:     Thyroid: No thyromegaly.  Cardiovascular:     Rate and Rhythm: Normal rate and regular rhythm.     Heart sounds: Normal heart sounds.  Pulmonary:     Effort: Pulmonary effort is normal. No respiratory distress.     Breath sounds: Normal breath sounds. No wheezing.   Abdominal:     General: There is no distension.     Palpations: Abdomen is soft.     Tenderness: There is no abdominal tenderness.  Musculoskeletal:        General: No tenderness. Normal range of motion.     Cervical back: Normal range of motion and neck supple.  Lymphadenopathy:     Cervical: No cervical adenopathy.  Skin:    General: Skin is warm and dry.  Neurological:     Mental Status: He is alert and oriented to person, place, and time.     Deep Tendon Reflexes: Reflexes are normal and symmetric.  Psychiatric:        Behavior: Behavior normal.       Assessment & Plan:  Anthony Juarez is a 76 y.o. male . Medicare annual wellness visit, subsequent - Plan: Comprehensive metabolic panel, Lipid panel  - - anticipatory guidance as below in AVS, screening labs if needed. Health maintenance items as above in HPI discussed/recommended as applicable.  - no concerning responses on depression, fall, or functional status screening. Any positive responses noted as above. Advanced directives discussed as in CH[facility].   Mixed hyperlipidemia - Plan: Lipid panel  -Tolerating current statin, continue same with co-Q10 as option.  RTC precautions if new or persistent arthralgia/myalgia.  Screening for prostate cancer  -Option of repeat  testing discussed including risks and benefits, especially after age 66.  Decided against PSA at this time.  BPH symptoms stable.  Okay to refill meds if needed.  Screening for diabetes mellitus  -Check CMP  Need for influenza vaccination - Plan: Flu Vaccine QUAD High Dose(Fluad) given.  Recommended COVID bivalent booster as well as Shingrix.   No orders of the defined types were placed in this encounter.  Patient Instructions  I do recommend appointment with dermatology for skin cancer screening.  No problem with taking CoQ 10.  If any concerns on labs I will let you know.  I would recommend bivalent booster at the pharmacy as well as Shingrix.  No med  changes at this time.  Keep follow-up with your specialist as planned.  If any new concerns or items we are unable to discuss today, please follow-up at separate visit.  Take care.   Preventive Care 29 Years and Older, Male Preventive care refers to lifestyle choices and visits with your health care provider that can promote health and wellness. Preventive care visits are also called wellness exams. What can I expect for my preventive care visit? Counseling During your preventive care visit, your health care provider may ask about your: Medical history, including: Past medical problems. Family medical history. History of falls. Current health, including: Emotional well-being. Home life and relationship well-being. Sexual activity. Memory and ability to understand (cognition). Lifestyle, including: Alcohol, nicotine or tobacco, and drug use. Access to firearms. Diet, exercise, and sleep habits. Work and work Statistician. Sunscreen use. Safety issues such as seatbelt and bike helmet use. Physical exam Your health care provider will check your: Height and weight. These may be used to calculate your BMI (body mass index). BMI is a measurement that tells if you are at a healthy weight. Waist circumference. This measures the distance around your waistline. This measurement also tells if you are at a healthy weight and may help predict your risk of certain diseases, such as type 2 diabetes and high blood pressure. Heart rate and blood pressure. Body temperature. Skin for abnormal spots. What immunizations do I need? Vaccines are usually given at various ages, according to a schedule. Your health care provider will recommend vaccines for you based on your age, medical history, and lifestyle or other factors, such as travel or where you work. What tests do I need? Screening Your health care provider may recommend screening tests for certain conditions. This may include: Lipid and  cholesterol levels. Diabetes screening. This is done by checking your blood sugar (glucose) after you have not eaten for a while (fasting). Hepatitis C test. Hepatitis B test. HIV (human immunodeficiency virus) test. STI (sexually transmitted infection) testing, if you are at risk. Lung cancer screening. Colorectal cancer screening. Prostate cancer screening. Abdominal aortic aneurysm (AAA) screening. You may need this if you are a current or former smoker. Talk with your health care provider about your test results, treatment options, and if necessary, the need for more tests. Follow these instructions at home: Eating and drinking  Eat a diet that includes fresh fruits and vegetables, whole grains, lean protein, and low-fat dairy products. Limit your intake of foods with high amounts of sugar, saturated fats, and salt. Take vitamin and mineral supplements as recommended by your health care provider. Do not drink alcohol if your health care provider tells you not to drink. If you drink alcohol: Limit how much you have to 0-2 drinks a day. Know how much alcohol is in  your drink. In the [place] one drink equals one 12 oz bottle of beer (355 mL), one 5 oz glass of wine (148 mL), or one 1 oz glass of hard liquor (44 mL). Lifestyle Brush your teeth every morning and night with fluoride toothpaste. Floss one time each day. Exercise for at least 30 minutes 5 or more days each week. Do not use any products that contain nicotine or tobacco. These products include cigarettes, chewing tobacco, and vaping devices, such as e-cigarettes. If you need help quitting, ask your health care provider. Do not use drugs. If you are sexually active, practice safe sex. Use a condom or other form of protection to prevent STIs. Take aspirin only as told by your health care provider. Make sure that you understand how much to take and what form to take. Work with your health care provider to find out whether it is safe  and beneficial for you to take aspirin daily. Ask your health care provider if you need to take a cholesterol-lowering medicine (statin). Find healthy ways to manage stress, such as: Meditation, yoga, or listening to music. Journaling. Talking to a trusted person. Spending time with friends and family. Safety Always wear your seat belt while driving or riding in a vehicle. Do not drive: If you have been drinking alcohol. Do not ride with someone who has been drinking. When you are tired or distracted. While texting. If you have been using any mind-altering substances or drugs. Wear a helmet and other protective equipment during sports activities. If you have firearms in your house, make sure you follow all gun safety procedures. Minimize exposure to UV radiation to reduce your risk of skin cancer. What's next? Visit your health care provider once a year for an annual wellness visit. Ask your health care provider how often you should have your eyes and teeth checked. Stay up to date on all vaccines. This information is not intended to replace advice given to you by your health care provider. Make sure you discuss any questions you have with your health care provider. Document Revised: 12/25/2020 Document Reviewed: 12/25/2020 Elsevier Patient Education  2022 [place],   Merri Ray, MD [facility] 07/23/21 3:10 PM

## 2021-07-24 ENCOUNTER — Other Ambulatory Visit (INDEPENDENT_AMBULATORY_CARE_PROVIDER_SITE_OTHER): Payer: Medicare Other

## 2021-07-24 DIAGNOSIS — E782 Mixed hyperlipidemia: Secondary | ICD-10-CM

## 2021-07-24 DIAGNOSIS — Z0000 Encounter for general adult medical examination without abnormal findings: Secondary | ICD-10-CM | POA: Diagnosis not present

## 2021-07-24 LAB — COMPREHENSIVE METABOLIC PANEL
ALT: 24 U/L (ref 0–53)
AST: 42 U/L — ABNORMAL HIGH (ref 0–37)
Albumin: 4 g/dL (ref 3.5–5.2)
Alkaline Phosphatase: 50 U/L (ref 39–117)
BUN: 19 mg/dL (ref 6–23)
CO2: 28 mEq/L (ref 19–32)
Calcium: 8.7 mg/dL (ref 8.4–10.5)
Chloride: 105 mEq/L (ref 96–112)
Creatinine, Ser: 1.06 mg/dL (ref 0.40–1.50)
GFR: 69.73 mL/min (ref >60.00)
Glucose, Bld: 93 mg/dL (ref 70–99)
Potassium: 4.2 mEq/L (ref 3.5–5.1)
Sodium: 138 mEq/L (ref 135–145)
Total Bilirubin: 0.9 mg/dL (ref 0.2–1.2)
Total Protein: 6.1 g/dL (ref 6.0–8.3)

## 2021-07-24 LAB — LIPID PANEL
Cholesterol: 148 mg/dL (ref 0–200)
HDL: 76.6 mg/dL (ref >39.00)
LDL Cholesterol: 61 mg/dL (ref 0–99)
NonHDL: 71.72
Total CHOL/HDL Ratio: 2
Triglycerides: 56 mg/dL (ref 0.0–149.0)
VLDL: 11.2 mg/dL (ref 0.0–40.0)

## 2021-07-28 ENCOUNTER — Other Ambulatory Visit: Payer: Self-pay | Admitting: Family Medicine

## 2021-07-28 ENCOUNTER — Other Ambulatory Visit: Payer: Self-pay | Admitting: Neurology

## 2021-07-31 ENCOUNTER — Encounter (HOSPITAL_BASED_OUTPATIENT_CLINIC_OR_DEPARTMENT_OTHER): Payer: Self-pay | Admitting: Cardiology

## 2021-07-31 NOTE — Telephone Encounter (Signed)
Please advise Dr. Marlou Porch patient

## 2021-08-05 ENCOUNTER — Other Ambulatory Visit: Payer: Medicare Other

## 2021-09-10 DIAGNOSIS — Z20822 Contact with and (suspected) exposure to covid-19: Secondary | ICD-10-CM | POA: Diagnosis not present

## 2021-10-06 DIAGNOSIS — R142 Eructation: Secondary | ICD-10-CM | POA: Diagnosis not present

## 2021-10-06 DIAGNOSIS — Z1211 Encounter for screening for malignant neoplasm of colon: Secondary | ICD-10-CM | POA: Diagnosis not present

## 2021-10-06 DIAGNOSIS — R140 Abdominal distension (gaseous): Secondary | ICD-10-CM | POA: Diagnosis not present

## 2021-10-15 ENCOUNTER — Ambulatory Visit (INDEPENDENT_AMBULATORY_CARE_PROVIDER_SITE_OTHER): Payer: Medicare Other | Admitting: Neurology

## 2021-10-15 ENCOUNTER — Encounter: Payer: Self-pay | Admitting: Neurology

## 2021-10-15 VITALS — BP 128/73 | HR 76 | Ht 69.0 in | Wt 155.0 lb

## 2021-10-15 DIAGNOSIS — G40909 Epilepsy, unspecified, not intractable, without status epilepticus: Secondary | ICD-10-CM

## 2021-10-15 NOTE — Progress Notes (Addendum)
Patient: Anthony Juarez Date of Birth: Feb 10, 1946  Reason for Visit: Follow up for seizures, mild cognitive impairment History from: Patient Primary Neurologist: Dr. Krista Blue   ASSESSMENT AND PLAN 76 y.o. year old male  1.  Epilepsy -No recurrent seizures, continue Lamictal XR 200 mg daily  2.  Right frontal cavernous angioma, supratentorium small vessel disease 3.  Mild cognitive impairment -Stable, MoCA 30/30 today -We talked about considering a trial of Namenda in the future -Encouraged to continue exercise, brain stimulating exercises -B12, RPR, TSH were normal  Addendum: Colonoscopy by Dr. Elisha Headland on November 06, 2021, scattered small and large mouth diverticula in the sigmoid colon,  HISTORY  Mr. Anthony Juarez is a 76 yo WM, following up for seizure disorder   He has PMHx of seizure since infant, he was treated with dilantin and phenobarbital for a long time, was eventurally tapered off after seizure free since age 42, he had one recurrent seizure at age 62, was put back on dilantin '400mg'$  qhs, has been on it since.   He had Bachelor's degree, retired as Location manager, Insurance underwriter  in 2009, moved to [place] about 2.5 years ago, now he stays at home most of time, rarely initiated activities, felt fatigue, difficulty concontrating, getting worse since summer of 2013.  His wife complains that he is not up to his house projects anymore, frequent nocturia, which has prompted recent treatment with desmopression, which helped his frequent night time awaking,  He also has sleep apnea, but could not tolerate his BiPAP machine.He is seen by Dr. Gwenette Greet.   Dilantin level was 32 while he was taking Dilantin 100 mg 4 tablets each night, level decreased to 22 while he was taking Dilantin 100 mg 3 tablets each night, he has developed gingival hypertrophy, his brain fogginess has much improved with decreased dose of Dilantin, especially after he stopped taking Desopressin,     MRI scan of the brain showed a cavernous angioma with remote age hemorrhage in the right frontal subcortical region with  and adjacent  venous angioma.  There are moderate changes of chronic microvascular ischemia and mild degree of generalized cerebral atrophy.   Followup visit today patient has been switched to Keppra XR '750mg'$  daily. He is off Dilantin totally and his brain fogginess has improved. He has not had further seizure activity.   UPDATE Jan 19th 2015: He is now taking keppra xr '750mg'$  qhs, last seizure 1992, nocturnal seizure,  he only has mild fatigue, no longer having brain foggy sensation We have reviewed MRI taking January 2015, continue the right inferior frontal cavernous venous angioma, mild atrophy, moderate small vessel disease,   UPDATE Jan 19th 2016; I have reviewed MRI brain (with and without): right frontal juxtacortical T2 heterogenous lesion (1.4x1.0cm), with "popcorn" appearance, consistent with cavernous malformation. There is an associated small developmental venous anomaly.   Multiple round and ovoid, periventricular, subcortical and juxtacortical T2 hyperintense foci. These findings are non-specific and considerations include autoimmune, inflammatory, post-infectious, microvascular ischemic or migraine associated etiologies.     He is taking Keppra XR 750 mg every night, there was no recurrent seizure   UPDATE Aug 01 2015: He had sleep study recently,  No need for CPAP machine, he is taking keppra xr '750mg'$  qhs, no recurrent seizure.    We have reviewed MRI of the brain with and without contrast in January 2017: 1.   A focus of heterogenous signal in the superficial right frontal lobe with a  hemosiderin rim and adjacent venous anomaly. This has characteristics of a cavernous malformation.   Compared to the 07/20/2013 MRI, there has been no definite change. 2.   Scattered T2/FLAIR hyperintense foci, predominantly in the subcortical deep white matter of both  hemispheres. This is stable compared to the prior MRI. She is a nonspecific finding and potential etiology could be chronic microvascular ischemic change, migraine, chronic demyelination.    UPDATE Jun 21 2018: He is overall doing well, taking Keppra 750 mg tablets half tablets twice a day, does complain some moodiness, discussed with patient we decided to switch him to lamotrigine 200 mg every day   UPDATE Jun 27 2020: He is overall doing very well, has no recurrent seizure, tolerating lamotrigine ER 200 mg well, complains of difficulty sleeping occasionally, especially after using bathroom at nighttime, he hiked with his wife regularly,   Recent couple years, noticed mild memory loss, word finding difficulties, MoCA examination 29/30 today,   We again personally reviewed MRI of brain with without contrast in 2017: Focus of heterogeneous signal at the superficial right frontal lobe, with hemosiderin rim and adjacent venous anomaly, characteristic of a cavernous malformation,    Update October 15, 2021 SS: Here alone, trouble with short term memory names, places. Does well driving, medications, keeps calendar. Lives with wife. Active with hiking. No seizures. Remains on Lamictal ER 200 mg at 5 PM. Has BPH, nocturia. Has macular degeneration, lower back pain on Meloxicam. MOCA 30/30.  REVIEW OF SYSTEMS: Out of a complete 14 system review of symptoms, the patient complains only of the following symptoms, and all other reviewed systems are negative.  See HPI  ALLERGIES: Allergies  Allergen Reactions   Penicillins Rash    Did it involve swelling of the face/tongue/throat, SOB, or low BP? No Did it involve sudden or severe rash/hives, skin peeling, or any reaction on the inside of your mouth or nose? Yes Did you need to seek medical attention at a hospital or doctor's office? No When did it last happen?  Decades ago. If all above answers are "NO", may proceed with cephalosporin use.     HOME  MEDICATIONS: Outpatient Medications Prior to Visit  Medication Sig Dispense Refill   albuterol (PROVENTIL HFA;VENTOLIN HFA) 108 (90 Base) MCG/ACT inhaler Inhale 2 puffs into the lungs every 4 (four) hours as needed for wheezing or shortness of breath (cough, shortness of breath or wheezing.). 1 Inhaler 2   aspirin EC 81 MG tablet Take 81 mg by mouth 4 (four) times a week.     doxazosin (CARDURA) 2 MG tablet TAKE ONE TABLET by mouth ONCE DAILY 90 tablet 1   ezetimibe (ZETIA) 10 MG tablet Take 1 tablet (10 mg total) by mouth daily. 90 tablet 3   finasteride (PROSCAR) 5 MG tablet TAKE 1 TABLET ONCE DAILY. 90 tablet 2   fluticasone (FLONASE) 50 MCG/ACT nasal spray USE 1 SPRAY IN EACH NOSTRIL TWICE A DAY. 16 g 6   LamoTRIgine 200 MG TB24 24 hour tablet TAKE ONE TABLET AT BEDTIME. 90 tablet 4   meloxicam (MOBIC) 7.5 MG tablet Take 1 tablet (7.5 mg total) by mouth daily. (Patient taking differently: Take 7.5 mg by mouth as needed.) 30 tablet 1   montelukast (SINGULAIR) 10 MG tablet TAKE ONE TABLET AT BEDTIME. 90 tablet 3   Multiple Vitamins-Minerals (MULTIVITAMIN WITH MINERALS) tablet Take 1 tablet by mouth daily.     NON FORMULARY Take by mouth at bedtime. Calcium 600 mg, Magnesium 300 mg  and Vitamin D3     rosuvastatin (CRESTOR) 20 MG tablet Take 1 tablet (20 mg total) by mouth daily. 90 tablet 3   No facility-administered medications prior to visit.    PAST MEDICAL HISTORY: Past Medical History:  Diagnosis Date   Allergy    Arthritis    Asthma    childhood   AVM (arteriovenous malformation) brain 06/15/2013   Balanitis    Benign prostatic hypertrophy    Central sleep apnea    Cholecystitis with cholelithiasis    Chronic sinus bradycardia 07/09/2018   Convulsive disorder ([place])    Esophageal reflux 03/28/2015   History of nuclear stress test    Myoview 11/16: EF 60%, normal perfusion, Low Risk   Hyperlipidemia    Prostatitis    Seizures ([place])     PAST SURGICAL HISTORY: Past Surgical  History:  Procedure Laterality Date   ELBOW SURGERY  11/11/2000   repair   FRACTURE SURGERY     LEFT ELBOW   TONSILLECTOMY      FAMILY HISTORY: Family History  Problem Relation Age of Onset   Heart disease Father        cabg 31's   Cancer Father        Prostate   Heart disease Maternal Grandfather        Late 4's   Dementia Mother     SOCIAL HISTORY: Social History   Socioeconomic History   Marital status: Married    Spouse name: [name]   Number of children: Not on file   Years of education: Not on file   Highest education level: Bachelor's degree (e.g., BA, AB, BS)  Occupational History   Occupation: retired    Fish farm manager: RETIRED  Tobacco Use   Smoking status: Former    Packs/day: 1.00    Years: 10.00    Pack years: 10.00    Types: Cigarettes    Quit date: 07/13/1977    Years since quitting: 44.2   Smokeless tobacco: Never  Vaping Use   Vaping Use: Never used  Substance and Sexual Activity   Alcohol use: Yes    Alcohol/week: 2.0 standard drinks    Types: 2 Cans of beer per week    Comment: 2 beers a day   Drug use: No   Sexual activity: Not Currently    Birth control/protection: Post-menopausal, Abstinence  Other Topics Concern   Not on file  Social History Narrative   Patient worked in [employer].   Social Determinants of Health   Financial Resource Strain: Not on file  Food Insecurity: Not on file  Transportation Needs: Not on file  Physical Activity: Not on file  Stress: Not on file  Social Connections: Not on file  Intimate Partner Violence: Not on file    PHYSICAL EXAM  Vitals:   10/15/21 0936  BP: 128/73  Pulse: 76  Weight: 155 lb (70.3 kg)  Height: '5\' 9"'$  (1.753 m)   Body mass index is 22.89 kg/m.    10/15/2021   10:19 AM 06/27/2020    3:00 PM  Montreal Cognitive Assessment   Visuospatial/ Executive (0/5) 5 5  Naming (0/3) 3 3  Attention: Read list of digits (0/2) 2 2  Attention: Read list of letters (0/1) 1 1   Attention: Serial 7 subtraction starting at 100 (0/3) 3 3  Language: Repeat phrase (0/2) 2 2  Language : Fluency (0/1) 1 1  Abstraction (0/2) 2 2  Delayed Recall (0/5) 5 4  Orientation (0/6) 6 6  Total 30 29  Adjusted Score (based on education) 30     Generalized: Well developed, in no acute distress  Neurological examination  Mentation: Alert oriented to time, place, history taking. Follows all commands speech and language fluent Cranial nerve II-XII: Pupils were equal round reactive to light. Extraocular movements were full, visual field were full on confrontational test. Facial sensation and strength were normal.  Head turning and shoulder shrug  were normal and symmetric. Motor: The motor testing reveals 5 over 5 strength of all 4 extremities. Good symmetric motor tone is noted throughout.  Sensory: Sensory testing is intact to soft touch on all 4 extremities. No evidence of extinction is noted.  Coordination: Cerebellar testing reveals good finger-nose-finger and heel-to-shin bilaterally.  Gait and station: Gait is normal. Tandem gait is normal.  Reflexes: Deep tendon reflexes are symmetric and normal bilaterally.   DIAGNOSTIC DATA (LABS, IMAGING, TESTING) - I reviewed patient records, labs, notes, testing and imaging myself where available.  Lab Results  Component Value Date   WBC 6.7 07/11/2020   HGB 14.8 07/11/2020   HCT 45.2 07/11/2020   MCV 93 07/11/2020   PLT 227 07/11/2020      Component Value Date/Time   NA 138 07/24/2021 0802   NA 141 07/11/2020 0851   K 4.2 07/24/2021 0802   CL 105 07/24/2021 0802   CO2 28 07/24/2021 0802   GLUCOSE 93 07/24/2021 0802   BUN 19 07/24/2021 0802   BUN 15 07/11/2020 0851   CREATININE 1.06 07/24/2021 0802   CREATININE 1.25 05/14/2016 0900   CALCIUM 8.7 07/24/2021 0802   PROT 6.1 07/24/2021 0802   PROT 6.0 07/11/2020 0851   ALBUMIN 4.0 07/24/2021 0802   ALBUMIN 4.2 07/11/2020 0851   AST 42 (H) 07/24/2021 0802   ALT 24  07/24/2021 0802   ALKPHOS 50 07/24/2021 0802   BILITOT 0.9 07/24/2021 0802   BILITOT 0.5 07/11/2020 0851   GFRNONAA 78 07/11/2020 0851   GFRAA 90 07/11/2020 0851   Lab Results  Component Value Date   CHOL 148 07/24/2021   HDL 76.60 07/24/2021   LDLCALC 61 07/24/2021   TRIG 56.0 07/24/2021   CHOLHDL 2 07/24/2021   Lab Results  Component Value Date   HGBA1C 5.6 07/11/2020   Lab Results  Component Value Date   OVZCHYIF02 774 07/16/2020   Lab Results  Component Value Date   TSH 2.680 07/16/2020    Butler Denmark, AGNP-C, DNP 10/15/2021, 10:19 AM [facility] [address], [place], [place] [address] [phone]

## 2021-10-21 ENCOUNTER — Ambulatory Visit: Payer: Medicare Other | Admitting: Neurology

## 2021-10-28 DIAGNOSIS — Z20822 Contact with and (suspected) exposure to covid-19: Secondary | ICD-10-CM | POA: Diagnosis not present

## 2021-11-06 DIAGNOSIS — Z1211 Encounter for screening for malignant neoplasm of colon: Secondary | ICD-10-CM | POA: Diagnosis not present

## 2021-11-06 DIAGNOSIS — K5730 Diverticulosis of large intestine without perforation or abscess without bleeding: Secondary | ICD-10-CM | POA: Diagnosis not present

## 2022-01-22 ENCOUNTER — Encounter: Payer: Self-pay | Admitting: Family Medicine

## 2022-01-22 ENCOUNTER — Ambulatory Visit (INDEPENDENT_AMBULATORY_CARE_PROVIDER_SITE_OTHER): Payer: Medicare Other | Admitting: Family Medicine

## 2022-01-22 VITALS — BP 122/72 | HR 61 | Temp 98.5°F | Resp 16 | Ht 69.0 in | Wt 155.0 lb

## 2022-01-22 DIAGNOSIS — R0982 Postnasal drip: Secondary | ICD-10-CM | POA: Diagnosis not present

## 2022-01-22 DIAGNOSIS — N401 Enlarged prostate with lower urinary tract symptoms: Secondary | ICD-10-CM | POA: Diagnosis not present

## 2022-01-22 DIAGNOSIS — E782 Mixed hyperlipidemia: Secondary | ICD-10-CM | POA: Diagnosis not present

## 2022-01-22 DIAGNOSIS — R12 Heartburn: Secondary | ICD-10-CM

## 2022-01-22 DIAGNOSIS — R0600 Dyspnea, unspecified: Secondary | ICD-10-CM

## 2022-01-22 DIAGNOSIS — R3914 Feeling of incomplete bladder emptying: Secondary | ICD-10-CM | POA: Diagnosis not present

## 2022-01-22 DIAGNOSIS — R059 Cough, unspecified: Secondary | ICD-10-CM | POA: Diagnosis not present

## 2022-01-22 LAB — COMPREHENSIVE METABOLIC PANEL
ALT: 31 U/L (ref 0–53)
AST: 48 U/L — ABNORMAL HIGH (ref 0–37)
Albumin: 4.4 g/dL (ref 3.5–5.2)
Alkaline Phosphatase: 52 U/L (ref 39–117)
BUN: 31 mg/dL — ABNORMAL HIGH (ref 6–23)
CO2: 26 mEq/L (ref 19–32)
Calcium: 9.5 mg/dL (ref 8.4–10.5)
Chloride: 106 mEq/L (ref 96–112)
Creatinine, Ser: 1.17 mg/dL (ref 0.40–1.50)
GFR: 61.72 mL/min (ref >60.00)
Glucose, Bld: 93 mg/dL (ref 70–99)
Potassium: 4.4 mEq/L (ref 3.5–5.1)
Sodium: 137 mEq/L (ref 135–145)
Total Bilirubin: 0.7 mg/dL (ref 0.2–1.2)
Total Protein: 6.7 g/dL (ref 6.0–8.3)

## 2022-01-22 LAB — LIPID PANEL
Cholesterol: 159 mg/dL (ref 0–200)
HDL: 80.7 mg/dL
LDL Cholesterol: 65 mg/dL (ref 0–99)
NonHDL: 78.44
Total CHOL/HDL Ratio: 2
Triglycerides: 66 mg/dL (ref 0.0–149.0)
VLDL: 13.2 mg/dL (ref 0.0–40.0)

## 2022-01-22 MED ORDER — DOXAZOSIN MESYLATE 2 MG PO TABS: 2.0000 mg | ORAL_TABLET | Freq: Every day | ORAL | 2 refills | Status: DC

## 2022-01-22 MED ORDER — OMEPRAZOLE 20 MG PO CPDR: 20.0000 mg | DELAYED_RELEASE_CAPSULE | Freq: Every day | ORAL | 0 refills | Status: AC

## 2022-01-22 MED ORDER — FINASTERIDE 5 MG PO TABS: 5.0000 mg | ORAL_TABLET | Freq: Every day | ORAL | 2 refills | Status: DC

## 2022-01-22 NOTE — Patient Instructions (Signed)
Blood pressure looks good today and exam is reassuring.  Sensation of needing to take a deep breath and cough could be related to either the postnasal drip or heartburn/reflux.  Try taking omeprazole once per day for the next few weeks.  Continue on Flonase daily.  If symptoms improve, then can try tapering off 1 or the other to see which one was most effective.  If no improvement in symptoms then follow-up to discuss other causes, and can discuss with cardiology as well but less likely heart related.  Be seen if any new or worsening symptoms.  No other med changes today.  Thanks for coming in and take care.

## 2022-01-22 NOTE — Progress Notes (Signed)
Subjective:  Patient ID: Anthony Juarez, male    DOB: 10/16/1945  Age: 76 y.o. MRN: [id number]  CC:  Chief Complaint  Patient presents with   Hypertension   Shortness of Breath    Pt reports changes in breathing notes no trouble some but has noticed having to inhale deeply more frequently     HPI Anthony Juarez presents for   HLD With history of sleep apnea, chronic sinus bradycardia and family history of early CAD. Followed by cardiology, on ezetimibe and high intensity statin Crestor 20 mg for hyperlipidemia,  goal LDL less than 70. yogurt this am.  BP Readings from Last 3 Encounters:  01/22/22 122/72  10/15/21 128/73  07/23/21 128/66   Lab Results  Component Value Date   CREATININE 1.06 07/24/2021   Lab Results  Component Value Date   CHOL 148 07/24/2021   HDL 76.60 07/24/2021   LDLCALC 61 07/24/2021   TRIG 56.0 07/24/2021   CHOLHDL 2 07/24/2021   Lab Results  Component Value Date   ALT 24 07/24/2021   AST 42 (H) 07/24/2021   ALKPHOS 50 07/24/2021   BILITOT 0.9 07/24/2021   Dyspnea Past month or two at rest, feels like needs to take a deep breath. No dypnea with exertion, or any symptoms with exercise. No chest pains. Clear mucus with cough at times. Notes some cough with clear phlegm after eating. Some belching with aerophagia, but not feeling heartburn. No hx of PUD, nexium few years ago for indigestion. Rare tums.  Recent trip to [place]. Hiking without symptoms.  Some PND at times - otc antihistamine at times. Flonase helps.  Melatonin helping sleep.  Cut back on alcohol - 5 drinks per week.    BPH: Flow ok, meds working ok. No new side effects with cardura, finasteride.   History Patient Active Problem List   Diagnosis Date Noted   Family history of early CAD 05/05/2021   Mild cognitive impairment 06/27/2020   Chronic sinus bradycardia 07/09/2018   Abnormal bowel movement 07/09/2018   Hyperinflation of lungs 07/09/2018   Non-seasonal  allergic rhinitis 07/09/2018   Abnormal finding on MRI of brain 12/04/2016   Benign prostatic hyperplasia with incomplete bladder emptying 11/11/2016   Mixed hyperlipidemia 05/13/2016   Esophageal reflux 03/28/2015   Shortness of breath 03/28/2015   Skin lesion of face 03/28/2015   AVM (arteriovenous malformation) brain 06/15/2013   Seizure disorder ([place]) 01/19/2013   Central sleep apnea 06/10/2012   Epigastric pain 06/06/2012   Gallstones 06/06/2012   Nausea 06/06/2012   Past Medical History:  Diagnosis Date   Allergy    Arthritis    Asthma    childhood   AVM (arteriovenous malformation) brain 06/15/2013   Balanitis    Benign prostatic hypertrophy    Central sleep apnea    Cholecystitis with cholelithiasis    Chronic sinus bradycardia 07/09/2018   Convulsive disorder ([place])    Esophageal reflux 03/28/2015   History of nuclear stress test    Myoview 11/16: EF 60%, normal perfusion, Low Risk   Hyperlipidemia    Prostatitis    Seizures ([place])    Past Surgical History:  Procedure Laterality Date   ELBOW SURGERY  11/11/2000   repair   FRACTURE SURGERY     LEFT ELBOW   TONSILLECTOMY     Allergies  Allergen Reactions   Penicillins Rash    Did it involve swelling of the face/tongue/throat, SOB, or low BP? No Did it involve sudden or severe  rash/hives, skin peeling, or any reaction on the inside of your mouth or nose? Yes Did you need to seek medical attention at a hospital or doctor's office? No When did it last happen?  Decades ago. If all above answers are "NO", may proceed with cephalosporin use.    Prior to Admission medications   Medication Sig Start Date End Date Taking? Authorizing Provider  albuterol (PROVENTIL HFA;VENTOLIN HFA) 108 (90 Base) MCG/ACT inhaler Inhale 2 puffs into the lungs every 4 (four) hours as needed for wheezing or shortness of breath (cough, shortness of breath or wheezing.). 06/01/17  Yes Shawnee Knapp, MD  aspirin EC 81 MG tablet Take 81 mg by  mouth 4 (four) times a week.   Yes [provider]  doxazosin (CARDURA) 2 MG tablet TAKE ONE TABLET by mouth ONCE DAILY 07/28/21  Yes Wendie Agreste, MD  ezetimibe (ZETIA) 10 MG tablet Take 1 tablet (10 mg total) by mouth daily. 05/05/21  Yes Jerline Pain, MD  finasteride (PROSCAR) 5 MG tablet TAKE 1 TABLET ONCE DAILY. 06/18/21  Yes Wendie Agreste, MD  fluticasone [facility]) 50 MCG/ACT nasal spray USE 1 SPRAY IN EACH NOSTRIL TWICE A DAY. 09/02/20  Yes Wendie Agreste, MD  LamoTRIgine 200 MG TB24 24 hour tablet TAKE ONE TABLET AT BEDTIME. 07/28/21  Yes Marcial Pacas, MD  Melatonin 1 MG/ML LIQD Take by mouth.   Yes [provider]  meloxicam (MOBIC) 7.5 MG tablet Take 1 tablet (7.5 mg total) by mouth daily. Patient taking differently: Take 7.5 mg by mouth as needed. 02/05/21  Yes Wendie Agreste, MD  montelukast (SINGULAIR) 10 MG tablet TAKE ONE TABLET AT BEDTIME. 06/26/21  Yes Wendie Agreste, MD  Multiple Vitamins-Minerals (MULTIVITAMIN WITH MINERALS) tablet Take 1 tablet by mouth daily.   Yes [provider]  rosuvastatin (CRESTOR) 20 MG tablet Take 1 tablet (20 mg total) by mouth daily. 06/03/21  Yes Jerline Pain, MD  NON FORMULARY Take by mouth at bedtime. Calcium 600 mg, Magnesium 300 mg and Vitamin D3    [provider]   Social History   Socioeconomic History   Marital status: Married    Spouse name: [name]   Number of children: Not on file   Years of education: Not on file   Highest education level: Bachelor's degree (e.g., BA, AB, BS)  Occupational History   Occupation: retired    Fish farm manager: RETIRED  Tobacco Use   Smoking status: Former    Packs/day: 1.00    Years: 10.00    Total pack years: 10.00    Types: Cigarettes    Quit date: 07/13/1977    Years since quitting: 44.5   Smokeless tobacco: Never  Vaping Use   Vaping Use: Never used  Substance and Sexual Activity   Alcohol use: Yes    Alcohol/week: 2.0 standard drinks of alcohol     Types: 2 Cans of beer per week    Comment: 2 beers a day   Drug use: No   Sexual activity: Not Currently    Birth control/protection: Post-menopausal, Abstinence  Other Topics Concern   Not on file  Social History Narrative   Patient worked in [employer].   Social Determinants of Health   Financial Resource Strain: Not on file  Food Insecurity: Not on file  Transportation Needs: Not on file  Physical Activity: Insufficiently Active (07/09/2018)   Exercise Vital Sign    Days of Exercise per Week: 4 days  Minutes of Exercise per Session: 30 min  Stress: Not on file  Social Connections: Not on file  Intimate Partner Violence: Not on file    Review of Systems Per HPI.  Objective:   Vitals:   01/22/22 1018  BP: 122/72  Pulse: 61  Resp: 16  Temp: 98.5 F (36.9 C)  TempSrc: Oral  SpO2: 96%  Weight: 155 lb (70.3 kg)  Height: '5\' 9"'$  (1.753 m)     Physical Exam Vitals reviewed.  Constitutional:      Appearance: He is well-developed.  HENT:     Head: Normocephalic and atraumatic.  Neck:     Vascular: No carotid bruit or JVD.  Cardiovascular:     Rate and Rhythm: Normal rate and regular rhythm.     Heart sounds: Normal heart sounds. No murmur heard. Pulmonary:     Effort: Pulmonary effort is normal.     Breath sounds: Normal breath sounds. No rales.  Musculoskeletal:     Right lower leg: No edema.     Left lower leg: No edema.  Skin:    General: Skin is warm and dry.  Neurological:     Mental Status: He is alert and oriented to person, place, and time.  Psychiatric:        Mood and Affect: Mood normal.     Assessment & Plan:  Anthony Juarez is a 76 y.o. male . Mixed hyperlipidemia - Plan: Comprehensive metabolic panel, Lipid panel  -  Stable, tolerating current regimen.continue cardiology follow up as panned. Labs pending as above.   Dyspnea, unspecified type - Plan: omeprazole (PRILOSEC) 20 MG capsule Cough, unspecified type Heartburn -  Plan: omeprazole (PRILOSEC) 20 MG capsule PND (post-nasal drip) - Plan: omeprazole (PRILOSEC) 20 MG capsule  - new concerns. No dyspnea or chest symptoms with exercise/activity.  Sensation of having to take a deep breath, no true shortness of breath.  Possible silent reflux, postnasal drip that may be contributing to the cough with intermittent clear phlegm.  Lungs were clear on exam.  No red flags on exam.  -Initial trial of omeprazole for the next few weeks as well as consistent use of Flonase with update on symptoms.  If not improving, then consider imaging or discussion with cardiology.  ER/RTC precautions if worse  Benign prostatic hyperplasia with incomplete bladder emptying - Plan: doxazosin (CARDURA) 2 MG tablet, finasteride (PROSCAR) 5 MG tablet  -Stable on current regimen, no new side effects, continue same.  Meds ordered this encounter  Medications   doxazosin (CARDURA) 2 MG tablet    Sig: Take 1 tablet (2 mg total) by mouth daily.    Dispense:  90 tablet    Refill:  2   finasteride (PROSCAR) 5 MG tablet    Sig: Take 1 tablet (5 mg total) by mouth daily.    Dispense:  90 tablet    Refill:  2   omeprazole (PRILOSEC) 20 MG capsule    Sig: Take 1 capsule (20 mg total) by mouth daily.    Dispense:  30 capsule    Refill:  0   Patient Instructions  Blood pressure looks good today and exam is reassuring.  Sensation of needing to take a deep breath and cough could be related to either the postnasal drip or heartburn/reflux.  Try taking omeprazole once per day for the next few weeks.  Continue on Flonase daily.  If symptoms improve, then can try tapering off 1 or the other to see which one was most  effective.  If no improvement in symptoms then follow-up to discuss other causes, and can discuss with cardiology as well but less likely heart related.  Be seen if any new or worsening symptoms.  No other med changes today.  Thanks for coming in and take care.     Signed,   Merri Ray,  MD [place] 01/22/22 10:55 AM

## 2022-01-25 ENCOUNTER — Encounter: Payer: Self-pay | Admitting: Family Medicine

## 2022-02-05 ENCOUNTER — Ambulatory Visit (INDEPENDENT_AMBULATORY_CARE_PROVIDER_SITE_OTHER): Payer: Medicare Other | Admitting: Family Medicine

## 2022-02-05 ENCOUNTER — Encounter: Payer: Self-pay | Admitting: Family Medicine

## 2022-02-05 VITALS — BP 136/74 | HR 76 | Temp 97.9°F | Resp 16 | Ht 69.0 in | Wt 154.0 lb

## 2022-02-05 DIAGNOSIS — S61411A Laceration without foreign body of right hand, initial encounter: Secondary | ICD-10-CM | POA: Diagnosis not present

## 2022-02-05 NOTE — Patient Instructions (Signed)
I do not see any signs of infection on the skin tear today.  The Steri-Strips should stay in place long enough to allow that to heal from below but if those fall off, can reapply the additional Steri-Strips I provided.  Watch for any surrounding redness or pus, or worsening symptoms and recheck if that occurs.  Let me know if there are questions.   Skin Tear A skin tear is a wound in which the top layers of skin have peeled off from the deeper skin or tissues underneath. This is a common problem as people get older because the skin becomes thinner and more fragile. In addition, some medicines, such as oral corticosteroids, can lead to thinning skin if they are taken for long periods of time. A skin tear is often repaired with tape or skin adhesive strips. Depending on the location of the wound, a bandage (dressing) may be applied over the tape or adhesive strips. Follow these instructions at home: Wound care  Clean the wound as told by your health care provider. You may be instructed to keep the wound dry for the first few days. If you are told to clean the wound: Wash the wound as told by your health care provider. This may include using mild soap and water, a wound cleanser, or a salt-water (saline) solution. If using soap, rinse the wound with water to remove all soap. Do not rub the wound dry. Pat it gently with a clean towel or let it air-dry. Change any dressings as told by your health care provider. This may include changing the dressing if it gets wet, gets dirty, or starts to smell bad. Wash your hands with soap and water for at least 20 seconds before and after you change your bandage (dressing). If soap and water are not available, use hand sanitizer. Leave tape or skin adhesive strips in place. These skin closures may need to stay in place for 2 weeks or longer. If adhesive strip edges start to loosen and curl up, you may trim the loose edges. Do not remove adhesive strips completely unless  your health care provider tells you to do that. Check your wound every day for signs of infection. Check for: Redness, swelling, or pain. More fluid or blood. Warmth. Pus or a bad smell. Do not scratch or pick at the wound. Protect the injured area until it has healed. Medicines Take or apply over-the-counter and prescription medicines only as told by your health care provider. If you were prescribed an antibiotic medicine, take or apply it as told by your health care provider. Do not stop using the antibiotic even if your condition improves. General instructions  Keep the dressing dry as told by your health care provider. Do not take baths, swim, use a hot tub, or do anything that puts your wound underwater until your health care provider approves. Ask your health care provider if you may take showers. You may only be allowed to take sponge baths. Keep all follow-up visits. This is important. Contact a health care provider if: You have redness, swelling, or pain around your wound. You have more fluid or blood coming from your wound. Your wound, or the area around your wound, feels warm to the touch. You have pus or a bad smell coming from your wound. Get help right away if: You have a red streak that goes away from the skin tear. You have a fever and chills, and your symptoms suddenly get worse. Summary A skin tear  is a wound in which the top layers of skin have peeled off from the deeper skin or tissues underneath. A skin tear is often repaired with tape or skin adhesive strips, and a bandage (dressing) may be applied over the tape or the adhesive strips. Change any dressings as told by your health care provider. Take or apply over-the-counter and prescription medicines only as told by your health care provider. Contact a health care provider if you have signs of infection. This information is not intended to replace advice given to you by your health care provider. Make sure you  discuss any questions you have with your health care provider. Document Revised: 10/04/2019 Document Reviewed: 10/04/2019 Elsevier Patient Education  [place].

## 2022-02-05 NOTE — Progress Notes (Signed)
Subjective:  Patient ID: Anthony Juarez, male    DOB: 1945-08-24  Age: 76 y.o. MRN: [id number]  CC:  Chief Complaint  Patient presents with   Hand Injury    Pt notes scraped the back of his hand on Monday, has been wearing wrist brace as "armor"     HPI Anthony Juarez presents for   R Hand injury Date of injury 02/02/2022.  Scraped back of hand on underside of table. Has scraped other areas in past, usually covers with bandage. This one is larger than others and flap pulls with wrist movement. No exudate. Min soreness. No surrounding redness.  R hand dominant, using hand/wrist normally.  Tx: polysporin, bandage, and wrist brace to protect.  Blood thinner - 4 of the '81mg'$  ASA per week.   History Patient Active Problem List   Diagnosis Date Noted   Family history of early CAD 05/05/2021   Mild cognitive impairment 06/27/2020   Chronic sinus bradycardia 07/09/2018   Abnormal bowel movement 07/09/2018   Hyperinflation of lungs 07/09/2018   Non-seasonal allergic rhinitis 07/09/2018   Abnormal finding on MRI of brain 12/04/2016   Benign prostatic hyperplasia with incomplete bladder emptying 11/11/2016   Mixed hyperlipidemia 05/13/2016   Esophageal reflux 03/28/2015   Shortness of breath 03/28/2015   Skin lesion of face 03/28/2015   AVM (arteriovenous malformation) brain 06/15/2013   Seizure disorder ([place]) 01/19/2013   Central sleep apnea 06/10/2012   Epigastric pain 06/06/2012   Gallstones 06/06/2012   Nausea 06/06/2012   Past Medical History:  Diagnosis Date   Allergy    Arthritis    Asthma    childhood   AVM (arteriovenous malformation) brain 06/15/2013   Balanitis    Benign prostatic hypertrophy    Central sleep apnea    Cholecystitis with cholelithiasis    Chronic sinus bradycardia 07/09/2018   Convulsive disorder ([place])    Esophageal reflux 03/28/2015   History of nuclear stress test    Myoview 11/16: EF 60%, normal perfusion, Low Risk   Hyperlipidemia     Prostatitis    Seizures ([place])    Past Surgical History:  Procedure Laterality Date   ELBOW SURGERY  11/11/2000   repair   FRACTURE SURGERY     LEFT ELBOW   TONSILLECTOMY     Allergies  Allergen Reactions   Penicillins Rash    Did it involve swelling of the face/tongue/throat, SOB, or low BP? No Did it involve sudden or severe rash/hives, skin peeling, or any reaction on the inside of your mouth or nose? Yes Did you need to seek medical attention at a hospital or doctor's office? No When did it last happen?  Decades ago. If all above answers are "NO", may proceed with cephalosporin use.    Prior to Admission medications   Medication Sig Start Date End Date Taking? Authorizing Provider  albuterol (PROVENTIL HFA;VENTOLIN HFA) 108 (90 Base) MCG/ACT inhaler Inhale 2 puffs into the lungs every 4 (four) hours as needed for wheezing or shortness of breath (cough, shortness of breath or wheezing.). 06/01/17  Yes Shawnee Knapp, MD  aspirin EC 81 MG tablet Take 81 mg by mouth 4 (four) times a week.   Yes [provider]  doxazosin (CARDURA) 2 MG tablet Take 1 tablet (2 mg total) by mouth daily. 01/22/22  Yes Wendie Agreste, MD  ezetimibe (ZETIA) 10 MG tablet Take 1 tablet (10 mg total) by mouth daily. 05/05/21  Yes Jerline Pain, MD  finasteride (PROSCAR)  5 MG tablet Take 1 tablet (5 mg total) by mouth daily. 01/22/22  Yes Wendie Agreste, MD  fluticasone [facility]) 50 MCG/ACT nasal spray USE 1 SPRAY IN EACH NOSTRIL TWICE A DAY. 09/02/20  Yes Wendie Agreste, MD  LamoTRIgine 200 MG TB24 24 hour tablet TAKE ONE TABLET AT BEDTIME. 07/28/21  Yes Marcial Pacas, MD  Melatonin 1 MG/ML LIQD Take by mouth.   Yes [provider]  meloxicam (MOBIC) 7.5 MG tablet Take 1 tablet (7.5 mg total) by mouth daily. Patient taking differently: Take 7.5 mg by mouth as needed. 02/05/21  Yes Wendie Agreste, MD  montelukast (SINGULAIR) 10 MG tablet TAKE ONE TABLET AT BEDTIME. 06/26/21  Yes Wendie Agreste, MD  Multiple Vitamins-Minerals (MULTIVITAMIN WITH MINERALS) tablet Take 1 tablet by mouth daily.   Yes [provider]  omeprazole (PRILOSEC) 20 MG capsule Take 1 capsule (20 mg total) by mouth daily. 01/22/22  Yes Wendie Agreste, MD  rosuvastatin (CRESTOR) 20 MG tablet Take 1 tablet (20 mg total) by mouth daily. 06/03/21  Yes Jerline Pain, MD   Social History   Socioeconomic History   Marital status: Married    Spouse name: [name]   Number of children: Not on file   Years of education: Not on file   Highest education level: Bachelor's degree (e.g., BA, AB, BS)  Occupational History   Occupation: retired    Fish farm manager: RETIRED  Tobacco Use   Smoking status: Former    Packs/day: 1.00    Years: 10.00    Total pack years: 10.00    Types: Cigarettes    Quit date: 07/13/1977    Years since quitting: 44.5   Smokeless tobacco: Never  Vaping Use   Vaping Use: Never used  Substance and Sexual Activity   Alcohol use: Yes    Alcohol/week: 2.0 standard drinks of alcohol    Types: 2 Cans of beer per week    Comment: 2 beers a day   Drug use: No   Sexual activity: Not Currently    Birth control/protection: Post-menopausal, Abstinence  Other Topics Concern   Not on file  Social History Narrative   Patient worked in [employer].   Social Determinants of Health   Financial Resource Strain: Not on file  Food Insecurity: Not on file  Transportation Needs: Not on file  Physical Activity: Insufficiently Active (07/09/2018)   Exercise Vital Sign    Days of Exercise per Week: 4 days    Minutes of Exercise per Session: 30 min  Stress: Not on file  Social Connections: Not on file  Intimate Partner Violence: Not on file    Review of Systems   Objective:   Vitals:   02/05/22 1128  BP: 136/74  Pulse: 76  Resp: 16  Temp: 97.9 F (36.6 C)  TempSrc: Oral  SpO2: 97%  Weight: 154 lb (69.9 kg)  Height: '5\' 9"'$  (1.753 m)     Physical Exam    2.5cm skin  tear R hand without surrounding erythema. 2 steri strips applied.   Assessment & Plan:  Anthony Juarez is a 76 y.o. male . Skin tear of right hand without complication, initial encounter Skin tear dorsal right hand without underlying bony tenderness, induration or signs of infection.  2 Steri-Strips applied based on timing since initial wound, avoid Tegaderm for now.  Keep area clean, covered, RTC precautions given, handout given.  No orders of the defined types were placed in this encounter.  Patient  Instructions  I do not see any signs of infection on the skin tear today.  The Steri-Strips should stay in place long enough to allow that to heal from below but if those fall off, can reapply the additional Steri-Strips I provided.  Watch for any surrounding redness or pus, or worsening symptoms and recheck if that occurs.  Let me know if there are questions.   Skin Tear A skin tear is a wound in which the top layers of skin have peeled off from the deeper skin or tissues underneath. This is a common problem as people get older because the skin becomes thinner and more fragile. In addition, some medicines, such as oral corticosteroids, can lead to thinning skin if they are taken for long periods of time. A skin tear is often repaired with tape or skin adhesive strips. Depending on the location of the wound, a bandage (dressing) may be applied over the tape or adhesive strips. Follow these instructions at home: Wound care  Clean the wound as told by your health care provider. You may be instructed to keep the wound dry for the first few days. If you are told to clean the wound: Wash the wound as told by your health care provider. This may include using mild soap and water, a wound cleanser, or a salt-water (saline) solution. If using soap, rinse the wound with water to remove all soap. Do not rub the wound dry. Pat it gently with a clean towel or let it air-dry. Change any dressings as told by  your health care provider. This may include changing the dressing if it gets wet, gets dirty, or starts to smell bad. Wash your hands with soap and water for at least 20 seconds before and after you change your bandage (dressing). If soap and water are not available, use hand sanitizer. Leave tape or skin adhesive strips in place. These skin closures may need to stay in place for 2 weeks or longer. If adhesive strip edges start to loosen and curl up, you may trim the loose edges. Do not remove adhesive strips completely unless your health care provider tells you to do that. Check your wound every day for signs of infection. Check for: Redness, swelling, or pain. More fluid or blood. Warmth. Pus or a bad smell. Do not scratch or pick at the wound. Protect the injured area until it has healed. Medicines Take or apply over-the-counter and prescription medicines only as told by your health care provider. If you were prescribed an antibiotic medicine, take or apply it as told by your health care provider. Do not stop using the antibiotic even if your condition improves. General instructions  Keep the dressing dry as told by your health care provider. Do not take baths, swim, use a hot tub, or do anything that puts your wound underwater until your health care provider approves. Ask your health care provider if you may take showers. You may only be allowed to take sponge baths. Keep all follow-up visits. This is important. Contact a health care provider if: You have redness, swelling, or pain around your wound. You have more fluid or blood coming from your wound. Your wound, or the area around your wound, feels warm to the touch. You have pus or a bad smell coming from your wound. Get help right away if: You have a red streak that goes away from the skin tear. You have a fever and chills, and your symptoms suddenly get worse. Summary A  skin tear is a wound in which the top layers of skin have  peeled off from the deeper skin or tissues underneath. A skin tear is often repaired with tape or skin adhesive strips, and a bandage (dressing) may be applied over the tape or the adhesive strips. Change any dressings as told by your health care provider. Take or apply over-the-counter and prescription medicines only as told by your health care provider. Contact a health care provider if you have signs of infection. This information is not intended to replace advice given to you by your health care provider. Make sure you discuss any questions you have with your health care provider. Document Revised: 10/04/2019 Document Reviewed: 10/04/2019 Elsevier Patient Education  [place],   Merri Ray, MD [place], [facility] 02/05/22 12:24 PM

## 2022-02-23 DIAGNOSIS — L309 Dermatitis, unspecified: Secondary | ICD-10-CM | POA: Diagnosis not present

## 2022-02-23 DIAGNOSIS — D1801 Hemangioma of skin and subcutaneous tissue: Secondary | ICD-10-CM | POA: Diagnosis not present

## 2022-02-23 DIAGNOSIS — L821 Other seborrheic keratosis: Secondary | ICD-10-CM | POA: Diagnosis not present

## 2022-02-23 DIAGNOSIS — L814 Other melanin hyperpigmentation: Secondary | ICD-10-CM | POA: Diagnosis not present

## 2022-03-11 ENCOUNTER — Encounter: Payer: Self-pay | Admitting: Family Medicine

## 2022-03-11 ENCOUNTER — Ambulatory Visit (INDEPENDENT_AMBULATORY_CARE_PROVIDER_SITE_OTHER): Payer: Medicare Other | Admitting: Family Medicine

## 2022-03-11 VITALS — BP 138/86 | HR 68 | Temp 98.1°F | Resp 16 | Ht 69.0 in | Wt 152.4 lb

## 2022-03-11 DIAGNOSIS — R110 Nausea: Secondary | ICD-10-CM

## 2022-03-11 DIAGNOSIS — R7989 Other specified abnormal findings of blood chemistry: Secondary | ICD-10-CM

## 2022-03-11 DIAGNOSIS — R1013 Epigastric pain: Secondary | ICD-10-CM | POA: Diagnosis not present

## 2022-03-11 DIAGNOSIS — M545 Low back pain, unspecified: Secondary | ICD-10-CM

## 2022-03-11 NOTE — Progress Notes (Signed)
Subjective:  Patient ID: Anthony Juarez, male    DOB: [birth year]  Age: 76 y.o. MRN: [id number]  CC:  Chief Complaint  Patient presents with   Results    Labs from July showed some liver enzyms and there was some soreness to the abdomen and pt is here to follow up as he is having some nausea     HPI Anthony Juarez presents for   Elevated LFTs AST mildly elevated at 42 on 07/24/2021, slightly higher on lab work July 13 with AST 48 but ALT normal, alk phos normal.  Treated for possible heartburn at that time with omeprazole. After that visit MyChart message sent July 16.  Reported some mild discomfort in his right abdomen.  RTC precautions were discussed.  Follow-up note on July 18 plan to monitor symptoms and make appointment if they continued.   Since last visit.  Feels fine in am, nausea after eating breakfast. Improves midday. Burning sensation in R abdomen comes and goes - off and on since last visit. Treats nausea with ginger and bitters. Helps.  No vomiting.  Similar sxs in past discussed with GI. Had gallstone on prior ultrasound? Years ago. No gallbladder surgery.  Similar symptoms past week after improvement for a few weeks. No fever.  Normal BM.  No fever.  No heartburn, no change with omeprazole.   Ultrasound in 2013: IMPRESSION:   1.Mild distended gallbladder with slight irregular wall without  wall thickening.  There is a 4 mm shadowing gallstone inferior  aspect of the gallbladder.  No pericholecystic fluid.  No  sonographic Murphy's sign.  2.  Prominent size CBD measures 8.4 mm in diameter.  3.  Scattered hepatic cysts are noted the largest in left hepatic  lobe measures 1.2 cm.  4.  No hydronephrosis or diagnostic renal calculus  History Patient Active Problem List   Diagnosis Date Noted   Family history of early CAD 05/05/2021   Mild cognitive impairment 06/27/2020   Chronic sinus bradycardia 07/09/2018   Abnormal bowel movement 07/09/2018    Hyperinflation of lungs 07/09/2018   Non-seasonal allergic rhinitis 07/09/2018   Abnormal finding on MRI of brain 12/04/2016   Benign prostatic hyperplasia with incomplete bladder emptying 11/11/2016   Mixed hyperlipidemia 05/13/2016   Esophageal reflux 03/28/2015   Shortness of breath 03/28/2015   Skin lesion of face 03/28/2015   AVM (arteriovenous malformation) brain 06/15/2013   Seizure disorder ([place]) 01/19/2013   Central sleep apnea 06/10/2012   Epigastric pain 06/06/2012   Gallstones 06/06/2012   Nausea 06/06/2012   Past Medical History:  Diagnosis Date   Allergy    Arthritis    Asthma    childhood   AVM (arteriovenous malformation) brain 06/15/2013   Balanitis    Benign prostatic hypertrophy    Central sleep apnea    Cholecystitis with cholelithiasis    Chronic sinus bradycardia 07/09/2018   Convulsive disorder ([place])    Esophageal reflux 03/28/2015   History of nuclear stress test    Myoview 11/16: EF 60%, normal perfusion, Low Risk   Hyperlipidemia    Prostatitis    Seizures ([place])    Past Surgical History:  Procedure Laterality Date   ELBOW SURGERY  11/11/2000   repair   FRACTURE SURGERY     LEFT ELBOW   TONSILLECTOMY     Allergies  Allergen Reactions   Penicillins Rash    Did it involve swelling of the face/tongue/throat, SOB, or low BP? No Did it involve sudden or severe  rash/hives, skin peeling, or any reaction on the inside of your mouth or nose? Yes Did you need to seek medical attention at a hospital or doctor's office? No When did it last happen?  Decades ago. If all above answers are "NO", may proceed with cephalosporin use.    Prior to Admission medications   Medication Sig Start Date End Date Taking? Authorizing Provider  albuterol (PROVENTIL HFA;VENTOLIN HFA) 108 (90 Base) MCG/ACT inhaler Inhale 2 puffs into the lungs every 4 (four) hours as needed for wheezing or shortness of breath (cough, shortness of breath or wheezing.). 06/01/17  Yes Shawnee Knapp, MD  aspirin EC 81 MG tablet Take 81 mg by mouth 4 (four) times a week.   Yes [provider]  doxazosin (CARDURA) 2 MG tablet Take 1 tablet (2 mg total) by mouth daily. 01/22/22  Yes Wendie Agreste, MD  ezetimibe (ZETIA) 10 MG tablet Take 1 tablet (10 mg total) by mouth daily. 05/05/21  Yes Jerline Pain, MD  finasteride (PROSCAR) 5 MG tablet Take 1 tablet (5 mg total) by mouth daily. 01/22/22  Yes Wendie Agreste, MD  fluticasone [facility]) 50 MCG/ACT nasal spray USE 1 SPRAY IN EACH NOSTRIL TWICE A DAY. 09/02/20  Yes Wendie Agreste, MD  LamoTRIgine 200 MG TB24 24 hour tablet TAKE ONE TABLET AT BEDTIME. 07/28/21  Yes Marcial Pacas, MD  Melatonin 1 MG/ML LIQD Take by mouth.   Yes [provider]  meloxicam (MOBIC) 7.5 MG tablet Take 1 tablet (7.5 mg total) by mouth daily. Patient taking differently: Take 7.5 mg by mouth as needed. 02/05/21  Yes Wendie Agreste, MD  montelukast (SINGULAIR) 10 MG tablet TAKE ONE TABLET AT BEDTIME. 06/26/21  Yes Wendie Agreste, MD  Multiple Vitamins-Minerals (MULTIVITAMIN WITH MINERALS) tablet Take 1 tablet by mouth daily.   Yes [provider]  omeprazole (PRILOSEC) 20 MG capsule Take 1 capsule (20 mg total) by mouth daily. 01/22/22  Yes Wendie Agreste, MD  rosuvastatin (CRESTOR) 20 MG tablet Take 1 tablet (20 mg total) by mouth daily. 06/03/21  Yes Jerline Pain, MD   Social History   Socioeconomic History   Marital status: Married    Spouse name: [name]   Number of children: Not on file   Years of education: Not on file   Highest education level: Bachelor's degree (e.g., BA, AB, BS)  Occupational History   Occupation: retired    Fish farm manager: RETIRED  Tobacco Use   Smoking status: Former    Packs/day: 1.00    Years: 10.00    Total pack years: 10.00    Types: Cigarettes    Quit date: 07/13/1977    Years since quitting: 44.6   Smokeless tobacco: Never  Vaping Use   Vaping Use: Never used  Substance and Sexual  Activity   Alcohol use: Yes    Alcohol/week: 2.0 standard drinks of alcohol    Types: 2 Cans of beer per week    Comment: 2 beers a day   Drug use: No   Sexual activity: Not Currently    Birth control/protection: Post-menopausal, Abstinence  Other Topics Concern   Not on file  Social History Narrative   Patient worked in [employer].   Social Determinants of Health   Financial Resource Strain: Not on file  Food Insecurity: Not on file  Transportation Needs: Not on file  Physical Activity: Insufficiently Active (07/09/2018)   Exercise Vital Sign    Days of Exercise per  Week: 4 days    Minutes of Exercise per Session: 30 min  Stress: Not on file  Social Connections: Not on file  Intimate Partner Violence: Not on file    Review of Systems   Objective:   Vitals:   03/11/22 1620  BP: 138/86  Pulse: 68  Resp: 16  Temp: 98.1 F (36.7 C)  TempSrc: Oral  SpO2: 97%  Weight: 152 lb 6.4 oz (69.1 kg)  Height: _0  (1.753 m)     Physical Exam Constitutional:      General: He is not in acute distress.    Appearance: Normal appearance. He is well-developed.  HENT:     Head: Normocephalic and atraumatic.  Cardiovascular:     Rate and Rhythm: Normal rate.  Pulmonary:     Effort: Pulmonary effort is normal.  Abdominal:     General: Abdomen is flat. Bowel sounds are normal. There is no distension.     Palpations: Abdomen is soft.     Tenderness: There is no abdominal tenderness. There is no guarding.  Neurological:     Mental Status: He is alert and oriented to person, place, and time.  Psychiatric:        Mood and Affect: Mood normal.        Assessment & Plan:  Anthony Juarez is a 76 y.o. male . Epigastric pain - Plan: Hepatic function panel, US Abdomen Limited RUQ (LIVER/GB)  Elevated LFTs - Plan: Hepatic function panel, US Abdomen Limited RUQ (LIVER/GB)  Nausea - Plan: Hepatic function panel, US Abdomen Limited RUQ (LIVER/GB) Reassuring exam.   Borderline elevated LFT previously, but with intermittent nausea, abdominal discomfort and previous ultrasound findings we will repeat ultrasound, hepatic function panel.  Depending on his results likely will have him follow-up with his gastroenterologist to decide on further testing.  ER/RTC precautions given.  No orders of the defined types were placed in this encounter.  Patient Instructions       Signed,   Merri Ray, MD [place] 03/11/22 10:13 PM

## 2022-03-12 LAB — HEPATIC FUNCTION PANEL
ALT: 18 U/L (ref 0–53)
AST: 31 U/L (ref 0–37)
Albumin: 4.2 g/dL (ref 3.5–5.2)
Alkaline Phosphatase: 46 U/L (ref 39–117)
Bilirubin, Direct: 0.1 mg/dL (ref 0.0–0.3)
Total Bilirubin: 0.7 mg/dL (ref 0.2–1.2)
Total Protein: 6.5 g/dL (ref 6.0–8.3)

## 2022-03-15 ENCOUNTER — Other Ambulatory Visit: Payer: Self-pay | Admitting: Family Medicine

## 2022-03-18 ENCOUNTER — Ambulatory Visit
Admission: RE | Admit: 2022-03-18 | Discharge: 2022-03-18 | Disposition: A | Discharge status: Home / Self Care | Payer: Medicare Other | Source: Ambulatory Visit | Attending: Family Medicine | Admitting: Family Medicine

## 2022-03-18 DIAGNOSIS — R1011 Right upper quadrant pain: Secondary | ICD-10-CM | POA: Diagnosis not present

## 2022-03-18 DIAGNOSIS — R1013 Epigastric pain: Secondary | ICD-10-CM

## 2022-03-18 DIAGNOSIS — R110 Nausea: Secondary | ICD-10-CM

## 2022-03-18 DIAGNOSIS — R7989 Other specified abnormal findings of blood chemistry: Secondary | ICD-10-CM

## 2022-03-18 DIAGNOSIS — K8020 Calculus of gallbladder without cholecystitis without obstruction: Secondary | ICD-10-CM | POA: Diagnosis not present

## 2022-04-30 ENCOUNTER — Other Ambulatory Visit (HOSPITAL_BASED_OUTPATIENT_CLINIC_OR_DEPARTMENT_OTHER): Payer: Self-pay | Admitting: Cardiology

## 2022-05-31 ENCOUNTER — Other Ambulatory Visit (HOSPITAL_BASED_OUTPATIENT_CLINIC_OR_DEPARTMENT_OTHER): Payer: Self-pay | Admitting: Cardiology

## 2022-06-06 ENCOUNTER — Other Ambulatory Visit (HOSPITAL_BASED_OUTPATIENT_CLINIC_OR_DEPARTMENT_OTHER): Payer: Self-pay | Admitting: Cardiology

## 2022-06-10 DIAGNOSIS — R1011 Right upper quadrant pain: Secondary | ICD-10-CM | POA: Diagnosis not present

## 2022-07-01 ENCOUNTER — Other Ambulatory Visit (HOSPITAL_BASED_OUTPATIENT_CLINIC_OR_DEPARTMENT_OTHER): Payer: Self-pay | Admitting: Cardiology

## 2022-07-01 ENCOUNTER — Other Ambulatory Visit: Payer: Self-pay | Admitting: Family Medicine

## 2022-07-16 ENCOUNTER — Other Ambulatory Visit (HOSPITAL_BASED_OUTPATIENT_CLINIC_OR_DEPARTMENT_OTHER): Payer: Self-pay | Admitting: Cardiology

## 2022-07-22 ENCOUNTER — Emergency Department (HOSPITAL_COMMUNITY): Payer: Medicare Other

## 2022-07-22 ENCOUNTER — Emergency Department (HOSPITAL_COMMUNITY)
Admission: EM | Admit: 2022-07-22 | Discharge: 2022-07-23 | Disposition: A | Discharge status: Home / Self Care | Payer: Medicare Other | Attending: Emergency Medicine | Admitting: Emergency Medicine

## 2022-07-22 ENCOUNTER — Encounter (HOSPITAL_COMMUNITY): Payer: Self-pay

## 2022-07-22 DIAGNOSIS — R0789 Other chest pain: Secondary | ICD-10-CM | POA: Insufficient documentation

## 2022-07-22 DIAGNOSIS — Z7982 Long term (current) use of aspirin: Secondary | ICD-10-CM | POA: Diagnosis not present

## 2022-07-22 DIAGNOSIS — R0602 Shortness of breath: Secondary | ICD-10-CM | POA: Insufficient documentation

## 2022-07-22 DIAGNOSIS — R001 Bradycardia, unspecified: Secondary | ICD-10-CM | POA: Insufficient documentation

## 2022-07-22 DIAGNOSIS — R079 Chest pain, unspecified: Secondary | ICD-10-CM | POA: Diagnosis not present

## 2022-07-22 DIAGNOSIS — J45909 Unspecified asthma, uncomplicated: Secondary | ICD-10-CM | POA: Insufficient documentation

## 2022-07-22 DIAGNOSIS — Z7951 Long term (current) use of inhaled steroids: Secondary | ICD-10-CM | POA: Insufficient documentation

## 2022-07-22 DIAGNOSIS — Z1152 Encounter for screening for COVID-19: Secondary | ICD-10-CM | POA: Diagnosis not present

## 2022-07-22 DIAGNOSIS — J439 Emphysema, unspecified: Secondary | ICD-10-CM | POA: Diagnosis not present

## 2022-07-22 LAB — CBC
HCT: 42.1 % (ref 39.0–52.0)
Hemoglobin: 13.7 g/dL (ref 13.0–17.0)
MCH: 30.3 pg (ref 26.0–34.0)
MCHC: 32.5 g/dL (ref 30.0–36.0)
MCV: 93.1 fL (ref 80.0–100.0)
Platelets: 284 10*3/uL (ref 150–400)
RBC: 4.52 MIL/uL (ref 4.22–5.81)
RDW: 13.2 % (ref 11.5–15.5)
WBC: 8.5 10*3/uL (ref 4.0–10.5)
nRBC: 0 % (ref 0.0–0.2)

## 2022-07-22 LAB — BASIC METABOLIC PANEL
Anion gap: 7 (ref 5–15)
BUN: 19 mg/dL (ref 8–23)
CO2: 24 mmol/L (ref 22–32)
Calcium: 9 mg/dL (ref 8.9–10.3)
Chloride: 104 mmol/L (ref 98–111)
Creatinine, Ser: 0.99 mg/dL (ref 0.61–1.24)
GFR, Estimated: >60 mL/min (ref >60)
Glucose, Bld: 94 mg/dL (ref 70–99)
Potassium: 4.1 mmol/L (ref 3.5–5.1)
Sodium: 135 mmol/L (ref 135–145)

## 2022-07-22 LAB — RESP PANEL BY RT-PCR (RSV, FLU A&B, COVID)  RVPGX2
Influenza A by PCR: NEGATIVE
Influenza B by PCR: NEGATIVE
Resp Syncytial Virus by PCR: NEGATIVE
SARS Coronavirus 2 by RT PCR: NEGATIVE

## 2022-07-22 LAB — TROPONIN I (HIGH SENSITIVITY): Troponin I (High Sensitivity): 3 ng/L (ref <18)

## 2022-07-22 LAB — BRAIN NATRIURETIC PEPTIDE: B Natriuretic Peptide: 86.4 pg/mL (ref 0.0–100.0)

## 2022-07-22 NOTE — ED Provider Notes (Signed)
[facility] DEPT Provider Note  CSN: [id number] Arrival date & time: 07/22/22 1525  Chief Complaint(s) Chest Pain  HPI Anthony Juarez is a 77 y.o. male {Add pertinent medical, surgical, social history, OB history to HPI:1}    Chest Pain Pain location:  Substernal area and L chest Pain quality: pressure   Pain radiates to:  Does not radiate Pain severity:  Moderate Duration:  3 weeks Timing:  Intermittent Progression:  Waxing and waning Chronicity:  New Relieved by: sitting up and being active. Exacerbated by: lying down. Associated symptoms: no cough, no fever, no nausea, no palpitations, no shortness of breath and no vomiting   Risk factors: coronary artery disease (CT coronary score 166), high cholesterol, hypertension and male sex   Risk factors: no diabetes mellitus and no smoking     Past Medical History Past Medical History:  Diagnosis Date   Allergy    Arthritis    Asthma    childhood   AVM (arteriovenous malformation) brain 06/15/2013   Balanitis    Benign prostatic hypertrophy    Central sleep apnea    Cholecystitis with cholelithiasis    Chronic sinus bradycardia 07/09/2018   Convulsive disorder ([place])    Esophageal reflux 03/28/2015   History of nuclear stress test    Myoview 11/16: EF 60%, normal perfusion, Low Risk   Hyperlipidemia    Prostatitis    Seizures ([place])    Patient Active Problem List   Diagnosis Date Noted   Family history of early CAD 05/05/2021   Mild cognitive impairment 06/27/2020   Chronic sinus bradycardia 07/09/2018   Abnormal bowel movement 07/09/2018   Hyperinflation of lungs 07/09/2018   Non-seasonal allergic rhinitis 07/09/2018   Abnormal finding on MRI of brain 12/04/2016   Benign prostatic hyperplasia with incomplete bladder emptying 11/11/2016   Mixed hyperlipidemia 05/13/2016   Esophageal reflux 03/28/2015   Shortness of breath 03/28/2015   Skin lesion of face 03/28/2015   AVM  (arteriovenous malformation) brain 06/15/2013   Seizure disorder ([place]) 01/19/2013   Central sleep apnea 06/10/2012   Epigastric pain 06/06/2012   Gallstones 06/06/2012   Nausea 06/06/2012   Home Medication(s) Prior to Admission medications   Medication Sig Start Date End Date Taking? Authorizing Provider  albuterol (PROVENTIL HFA;VENTOLIN HFA) 108 (90 Base) MCG/ACT inhaler Inhale 2 puffs into the lungs every 4 (four) hours as needed for wheezing or shortness of breath (cough, shortness of breath or wheezing.). 06/01/17   Shawnee Knapp, MD  aspirin EC 81 MG tablet Take 81 mg by mouth 4 (four) times a week.    [provider]  doxazosin (CARDURA) 2 MG tablet Take 1 tablet (2 mg total) by mouth daily. 01/22/22   Wendie Agreste, MD  ezetimibe (ZETIA) 10 MG tablet Take 1 tablet (10 mg total) by mouth daily. 07/16/22   Jerline Pain, MD  finasteride (PROSCAR) 5 MG tablet Take 1 tablet (5 mg total) by mouth daily. 01/22/22   Wendie Agreste, MD  fluticasone [facility]) 50 MCG/ACT nasal spray USE 1 SPRAY IN EACH NOSTRIL TWICE A DAY. 03/17/22   Wendie Agreste, MD  LamoTRIgine 200 MG TB24 24 hour tablet TAKE ONE TABLET AT BEDTIME. 07/28/21   Marcial Pacas, MD  Melatonin 1 MG/ML LIQD Take by mouth.    [provider]  meloxicam (MOBIC) 7.5 MG tablet Take 1 tablet (7.5 mg total) by mouth daily. Patient taking differently: Take 7.5 mg by mouth as needed. 02/05/21  Wendie Agreste, MD  montelukast (SINGULAIR) 10 MG tablet TAKE ONE TABLET AT BEDTIME. 07/02/22   Wendie Agreste, MD  Multiple Vitamins-Minerals (MULTIVITAMIN WITH MINERALS) tablet Take 1 tablet by mouth daily.    [provider]  omeprazole (PRILOSEC) 20 MG capsule Take 1 capsule (20 mg total) by mouth daily. 01/22/22   Wendie Agreste, MD  rosuvastatin (CRESTOR) 20 MG tablet Take 1 tablet (20 mg total) by mouth daily. 07/16/22   Jerline Pain, MD                                                                                                                                     Allergies Penicillins  Review of Systems Review of Systems  Constitutional:  Negative for fever.  Respiratory:  Negative for cough and shortness of breath.   Cardiovascular:  Positive for chest pain. Negative for palpitations.  Gastrointestinal:  Negative for nausea and vomiting.   As noted in HPI  Physical Exam Vital Signs  I have reviewed the triage vital signs BP (!) 164/87   Pulse (!) 48   Temp 98.1 F (36.7 C) (Oral)   Resp 13   Ht '5\' 9"'$  (1.753 m)   Wt 69.1 kg   SpO2 100%   BMI 22.51 kg/m  *** Physical Exam Vitals reviewed.  Constitutional:      General: He is not in acute distress.    Appearance: He is well-developed. He is not diaphoretic.  HENT:     Head: Normocephalic and atraumatic.     Nose: Nose normal.  Eyes:     General: No scleral icterus.       Right eye: No discharge.        Left eye: No discharge.     Conjunctiva/sclera: Conjunctivae normal.     Pupils: Pupils are equal, round, and reactive to light.  Cardiovascular:     Rate and Rhythm: Normal rate. Rhythm regularly irregular.     Heart sounds: No murmur heard.    No friction rub. No gallop.  Pulmonary:     Effort: Pulmonary effort is normal. No respiratory distress.     Breath sounds: Normal breath sounds. No stridor. No rales.  Abdominal:     General: There is no distension.     Palpations: Abdomen is soft.     Tenderness: There is no abdominal tenderness.  Musculoskeletal:        General: No tenderness.     Cervical back: Normal range of motion and neck supple.  Skin:    General: Skin is warm and dry.     Findings: No erythema or rash.  Neurological:     Mental Status: He is alert and oriented to person, place, and time.     ED Results and Treatments Labs (all labs ordered are listed, but only abnormal results are displayed) Labs Reviewed  RESP PANEL BY RT-PCR (RSV, FLU A&B,  COVID)  [id number]  BASIC METABOLIC PANEL  CBC   BRAIN NATRIURETIC PEPTIDE  TROPONIN I (HIGH SENSITIVITY)  TROPONIN I (HIGH SENSITIVITY)                                                                                                                         EKG  EKG Interpretation  Date/Time:  Wednesday July 22 2022 16:25:10 EST Ventricular Rate:  73 PR Interval:  50 QRS Duration: 96 QT Interval:  384 QTC Calculation: 424 R Axis:   110 Text Interpretation: Sinus arrhythmia Short PR interval Right axis deviation Low voltage, extremity leads Minimal ST elevation, anterior leads Reconfirmed by Addison Lank [phone]) on 07/22/2022 11:12:33 PM       Radiology DG Chest 2 View  Result Date: 07/22/2022 CLINICAL DATA:  Chest pain with shortness of breath. Recent flu diagnosis. EXAM: CHEST - 2 VIEW COMPARISON:  Radiographs 11/11/2018.  Cardiac CT 01/17/2019. FINDINGS: The heart size and mediastinal contours are stable. The lungs are hyperinflated with a stable calcified right apical granuloma. No superimposed airspace disease, edema, pleural effusion or pneumothorax identified. The bones appear unchanged. IMPRESSION: Stable chest without evidence of acute cardiopulmonary process. Emphysema. Electronically Signed   By: Richardean Sale M.D.   On: 07/22/2022 17:19    Medications Ordered in ED Medications - No data to display                                                                                                                                   Procedures Procedures  (including critical care time)  Medical Decision Making / ED Course   Medical Decision Making         Final Clinical Impression(s) / ED Diagnoses Final diagnoses:  None    {Document critical care time when appropriate:1}  {Document review of labs and clinical decision tools ie heart score, Chads2Vasc2 etc:1}  {Document your independent review of radiology images, and any outside records:1} {Document your discussion with family members, caretakers, and  with consultants:1} {Document social determinants of health affecting pt's care:1} {Document your decision making why or why not admission, treatments were needed:1} This chart was dictated using voice recognition software.  Despite best efforts to proofread,  errors can occur which can change the documentation meaning.

## 2022-07-22 NOTE — ED Provider Triage Note (Signed)
Emergency Medicine Provider Triage Evaluation Note  Anthony Juarez , a 77 y.o. male  was evaluated in triage.  Pt complains of cough, nasal congestion, fatigue, intermittent chest pain in the last 2 weeks.  Reports shortness of breath worse with laying down.  Chest pain is intermittent, located in the center of the chest.  No fever, nausea, vomiting.  Review of Systems  Positive: As above Negative: As above  Physical Exam  BP 132/61 (BP Location: Left Arm)   Pulse 62   Temp 98.1 F (36.7 C) (Oral)   Resp 14   Ht '5\' 9"'$  (1.753 m)   Wt 69.1 kg   SpO2 98%   BMI 22.51 kg/m  Gen:   Awake, no distress   Resp:  Normal effort  MSK:   Moves extremities without difficulty  Other:    Medical Decision Making  Medically screening exam initiated at 4:49 PM.  Appropriate orders placed.  Emrik Erhard was informed that the remainder of the evaluation will be completed by another provider, this initial triage assessment does not replace that evaluation, and the importance of remaining in the ED until their evaluation is complete.     Rex Kras, [place] 07/23/22 [phone]

## 2022-07-22 NOTE — ED Triage Notes (Signed)
Pt arrived via POV, c/o CP, SOB. States recently dx with flu and has been recovering well but chest pain worsening.

## 2022-07-23 LAB — TROPONIN I (HIGH SENSITIVITY): Troponin I (High Sensitivity): 4 ng/L (ref <18)

## 2022-07-23 NOTE — Discharge Instructions (Addendum)
You may take Prilosec or Pepcid daily for acid reflux symptoms.

## 2022-07-27 ENCOUNTER — Ambulatory Visit (INDEPENDENT_AMBULATORY_CARE_PROVIDER_SITE_OTHER): Payer: Medicare Other | Admitting: Family Medicine

## 2022-07-27 ENCOUNTER — Encounter: Payer: Self-pay | Admitting: Family Medicine

## 2022-07-27 VITALS — BP 118/68 | HR 78 | Temp 98.6°F | Ht 69.0 in | Wt 148.2 lb

## 2022-07-27 DIAGNOSIS — R3914 Feeling of incomplete bladder emptying: Secondary | ICD-10-CM

## 2022-07-27 DIAGNOSIS — N401 Enlarged prostate with lower urinary tract symptoms: Secondary | ICD-10-CM | POA: Diagnosis not present

## 2022-07-27 DIAGNOSIS — Z8669 Personal history of other diseases of the nervous system and sense organs: Secondary | ICD-10-CM | POA: Diagnosis not present

## 2022-07-27 DIAGNOSIS — E782 Mixed hyperlipidemia: Secondary | ICD-10-CM | POA: Diagnosis not present

## 2022-07-27 DIAGNOSIS — Z23 Encounter for immunization: Secondary | ICD-10-CM | POA: Diagnosis not present

## 2022-07-27 DIAGNOSIS — R0789 Other chest pain: Secondary | ICD-10-CM | POA: Diagnosis not present

## 2022-07-27 DIAGNOSIS — R12 Heartburn: Secondary | ICD-10-CM

## 2022-07-27 LAB — LIPID PANEL
Cholesterol: 153 mg/dL (ref 0–200)
HDL: 74.4 mg/dL (ref >39.00)
LDL Cholesterol: 67 mg/dL (ref 0–99)
NonHDL: 78.59
Total CHOL/HDL Ratio: 2
Triglycerides: 60 mg/dL (ref 0.0–149.0)
VLDL: 12 mg/dL (ref 0.0–40.0)

## 2022-07-27 LAB — HEPATIC FUNCTION PANEL
ALT: 20 U/L (ref 0–53)
AST: 33 U/L (ref 0–37)
Albumin: 4.3 g/dL (ref 3.5–5.2)
Alkaline Phosphatase: 56 U/L (ref 39–117)
Bilirubin, Direct: 0.2 mg/dL (ref 0.0–0.3)
Total Bilirubin: 0.8 mg/dL (ref 0.2–1.2)
Total Protein: 6.5 g/dL (ref 6.0–8.3)

## 2022-07-27 MED ORDER — DOXAZOSIN MESYLATE 2 MG PO TABS: 2.0000 mg | ORAL_TABLET | Freq: Every day | ORAL | 2 refills | Status: DC

## 2022-07-27 MED ORDER — FINASTERIDE 5 MG PO TABS: 5.0000 mg | ORAL_TABLET | Freq: Every day | ORAL | 2 refills | Status: DC

## 2022-07-27 NOTE — Progress Notes (Signed)
Subjective:  Patient ID: Anthony Juarez, male    DOB: 1945-12-14  Age: 77 y.o. MRN: [id number]  CC:  Chief Complaint  Patient presents with   Follow-up    Pt went to ED for chest pressure, ED noted no concerns for heart attack, all tests were normal, does have a cardiology visit scheduled, was also recommended to follow up with GI due to potential lasting effect from flu causing this pain     HPI Anthony Juarez presents for   Chest discomfort, ER follow-up ER note reviewed from January 10.  3-week history of symptoms at time.  Noted at rest. Pressure feeling in center to left chest. Not with activity. Some dyspnea at times. Feeling of having to take a deep breath when resting, no dyspnea with activity.  He does have family history of early CAD, with coronary calcium score of 166 in 2020.  Dr. Marlou Porch.  Troponin was normal x2, BMP and BNP normal, CBC normal respiratory panel negative for COVID, flu, RSV. Chest x-ray stable without evidence of acute process.  Hyperinflated with stable calcified right apical granuloma. Prior 1ppd for 12 years, quit about 40 yrs ago  EKG with short PR interval, sinus arrhythmia.  Symptoms thought to be consistent with GERD/gastritis/esophagitis. He does take omeprazole 20 mg - in the past.,  gastroenterologist Dr. Benson Norway. Recent visit in November. Off omeprazole, did start pepcid once per day after ER visit. No changes.  Pressure has improved. Not felt since ER.  Denies anxiety/new stressors.  Abdominal  ultrasound March 18, 2022 with no suspicious focal lesions of the liver but benign cysts were noted.  No gallstones or wall thickening of the gallbladder.  Appointment scheduled with cardiology on January 23.  Hyperlipidemia: With family history of early CAD, elevated CCS as above.  Treated with Crestor 20 mg, Zetia 10 mg daily.  Also with history of sleep apnea - no treatment. Bipap recommended prior - unable to tolerate.  No current treatment, declines  referral to sleep specialist at this time. Sleeping ok, using melatonin.   Lab Results  Component Value Date   CHOL 159 01/22/2022   HDL 80.70 01/22/2022   LDLCALC 65 01/22/2022   TRIG 66.0 01/22/2022   CHOLHDL 2 01/22/2022   Lab Results  Component Value Date   ALT 18 03/11/2022   AST 31 03/11/2022   ALKPHOS 46 03/11/2022   BILITOT 0.7 03/11/2022   Benign prostatic hypertrophy Treated with finasteride, doxazosin. Nocturia - 2-3/night.  No recent urology eval. Prior Dr. Gloriann Loan. No retention.   HM: Flu vaccine today. Plans other vaccines at pharmacy.     History Patient Active Problem List   Diagnosis Date Noted   Family history of early CAD 05/05/2021   Mild cognitive impairment 06/27/2020   Chronic sinus bradycardia 07/09/2018   Abnormal bowel movement 07/09/2018   Hyperinflation of lungs 07/09/2018   Non-seasonal allergic rhinitis 07/09/2018   Abnormal finding on MRI of brain 12/04/2016   Benign prostatic hyperplasia with incomplete bladder emptying 11/11/2016   Mixed hyperlipidemia 05/13/2016   Esophageal reflux 03/28/2015   Shortness of breath 03/28/2015   Skin lesion of face 03/28/2015   AVM (arteriovenous malformation) brain 06/15/2013   Seizure disorder ([place]) 01/19/2013   Central sleep apnea 06/10/2012   Epigastric pain 06/06/2012   Gallstones 06/06/2012   Nausea 06/06/2012   Past Medical History:  Diagnosis Date   Allergy    Arthritis    Asthma    childhood   AVM (arteriovenous malformation)  brain 06/15/2013   Balanitis    Benign prostatic hypertrophy    Central sleep apnea    Cholecystitis with cholelithiasis    Chronic sinus bradycardia 07/09/2018   Convulsive disorder ([place])    Esophageal reflux 03/28/2015   History of nuclear stress test    Myoview 11/16: EF 60%, normal perfusion, Low Risk   Hyperlipidemia    Prostatitis    Seizures ([place])    Past Surgical History:  Procedure Laterality Date   ELBOW SURGERY  11/11/2000   repair   FRACTURE  SURGERY     LEFT ELBOW   TONSILLECTOMY     Allergies  Allergen Reactions   Penicillins Rash    Did it involve swelling of the face/tongue/throat, SOB, or low BP? No Did it involve sudden or severe rash/hives, skin peeling, or any reaction on the inside of your mouth or nose? Yes Did you need to seek medical attention at a hospital or doctor's office? No When did it last happen?  Decades ago. If all above answers are "NO", may proceed with cephalosporin use.    Prior to Admission medications   Medication Sig Start Date End Date Taking? Authorizing Provider  albuterol (PROVENTIL HFA;VENTOLIN HFA) 108 (90 Base) MCG/ACT inhaler Inhale 2 puffs into the lungs every 4 (four) hours as needed for wheezing or shortness of breath (cough, shortness of breath or wheezing.). 06/01/17   Shawnee Knapp, MD  aspirin EC 81 MG tablet Take 81 mg by mouth 4 (four) times a week.    [provider]  doxazosin (CARDURA) 2 MG tablet Take 1 tablet (2 mg total) by mouth daily. 01/22/22   Wendie Agreste, MD  ezetimibe (ZETIA) 10 MG tablet Take 1 tablet (10 mg total) by mouth daily. 07/16/22   Jerline Pain, MD  finasteride (PROSCAR) 5 MG tablet Take 1 tablet (5 mg total) by mouth daily. 01/22/22   Wendie Agreste, MD  fluticasone [facility]) 50 MCG/ACT nasal spray USE 1 SPRAY IN EACH NOSTRIL TWICE A DAY. 03/17/22   Wendie Agreste, MD  LamoTRIgine 200 MG TB24 24 hour tablet TAKE ONE TABLET AT BEDTIME. 07/28/21   Marcial Pacas, MD  Melatonin 1 MG/ML LIQD Take by mouth.    [provider]  meloxicam (MOBIC) 7.5 MG tablet Take 1 tablet (7.5 mg total) by mouth daily. Patient taking differently: Take 7.5 mg by mouth as needed. 02/05/21   Wendie Agreste, MD  montelukast (SINGULAIR) 10 MG tablet TAKE ONE TABLET AT BEDTIME. 07/02/22   Wendie Agreste, MD  Multiple Vitamins-Minerals (MULTIVITAMIN WITH MINERALS) tablet Take 1 tablet by mouth daily.    [provider]  omeprazole (PRILOSEC) 20 MG  capsule Take 1 capsule (20 mg total) by mouth daily. 01/22/22   Wendie Agreste, MD  rosuvastatin (CRESTOR) 20 MG tablet Take 1 tablet (20 mg total) by mouth daily. 07/16/22   Jerline Pain, MD   Social History   Socioeconomic History   Marital status: Married    Spouse name: [name]   Number of children: Not on file   Years of education: Not on file   Highest education level: Bachelor's degree (e.g., BA, AB, BS)  Occupational History   Occupation: retired    Fish farm manager: RETIRED  Tobacco Use   Smoking status: Former    Packs/day: 1.00    Years: 10.00    Total pack years: 10.00    Types: Cigarettes    Quit date: 07/13/1977    Years  since quitting: 45.0   Smokeless tobacco: Never  Vaping Use   Vaping Use: Never used  Substance and Sexual Activity   Alcohol use: Yes    Alcohol/week: 2.0 standard drinks of alcohol    Types: 2 Cans of beer per week    Comment: 2 beers a day   Drug use: No   Sexual activity: Not Currently    Birth control/protection: Post-menopausal, Abstinence  Other Topics Concern   Not on file  Social History Narrative   Patient worked in [employer].   Social Determinants of Health   Financial Resource Strain: Not on file  Food Insecurity: Not on file  Transportation Needs: Not on file  Physical Activity: Insufficiently Active (07/09/2018)   Exercise Vital Sign    Days of Exercise per Week: 4 days    Minutes of Exercise per Session: 30 min  Stress: Not on file  Social Connections: Not on file  Intimate Partner Violence: Not on file    Review of Systems Per HPI  Objective:   Vitals:   07/27/22 0906  BP: 118/68  Pulse: 78  Temp: 98.6 F (37 C)  TempSrc: Temporal  SpO2: 100%  Weight: 148 lb 3.2 oz (67.2 kg)  Height: '5\' 9"'$  (1.753 m)     Physical Exam Vitals reviewed.  Constitutional:      Appearance: He is well-developed.  HENT:     Head: Normocephalic and atraumatic.  Neck:     Vascular: No carotid bruit or JVD.   Cardiovascular:     Rate and Rhythm: Normal rate and regular rhythm.     Heart sounds: Normal heart sounds. No murmur heard.    Comments: Chest wall nontender, asymptomatic currently.  Unable to reproduce pain with exam. Pulmonary:     Effort: Pulmonary effort is normal.     Breath sounds: Normal breath sounds. No rales.  Musculoskeletal:     Right lower leg: No edema.     Left lower leg: No edema.  Skin:    General: Skin is warm and dry.  Neurological:     Mental Status: He is alert and oriented to person, place, and time.  Psychiatric:        Mood and Affect: Mood normal.      Assessment & Plan:  Anthony Juarez is a 77 y.o. male . Mixed hyperlipidemia - Plan: Hepatic function panel, Lipid panel  -Tolerating current regimen, check updated labs, keep follow-up with cardiology as planned  Benign prostatic hyperplasia with incomplete bladder emptying - Plan: Ambulatory referral to Urology, doxazosin (CARDURA) 2 MG tablet, finasteride (PROSCAR) 5 MG tablet  -Fair control, still 2-3 episodes of nocturia at night with some dribbling.  As he currently is on 2 medications, I will refer him to urology to discuss other treatment options.  Heartburn  -Possible heartburn component although chest symptoms have resolved.  Okay to continue H2 blocker for now with option of PPI.  Follow-up with gastroenterology if recurrence of symptoms.    Needs flu shot - Plan: Flu Vaccine QUAD 60moIM (Fluarix, Fluzone & Alfiuria Quad PF)  Atypical chest pain  -Pressure, other symptoms as above but reassuring evaluation through the ER.  And atypical symptoms.  Not with exertion.  Held on further workup today as symptoms have resolved.  Follow-up with cardiology as planned, with ER precautions  History of obstructive sleep apnea  -Tolerant to BiPAP previously.  Option to meet with sleep specialist for updated testing, he deferred for now.  No orders of  the defined types were placed in this  encounter.  Patient Instructions  Ok to try omeprazole, but keep follow up with cardiology. Glad to hear you are feeling better.   I will refer you to urology to discuss BPH and med options.  No changes for now.   Let me know if you would like to meet with sleep specialist.   Flu vaccine today, other vaccines can be given at your pharmacy.  Thanks for coming in today.  Please let me know if there are questions.         Signed,   Merri Ray, MD [place] 07/27/22 9:12 AM

## 2022-07-27 NOTE — Patient Instructions (Addendum)
Ok to try omeprazole, but keep follow up with cardiology. Glad to hear you are feeling better.   I will refer you to urology to discuss BPH and med options.  No changes for now.   Let me know if you would like to meet with sleep specialist.   Flu vaccine today, other vaccines can be given at your pharmacy.  Thanks for coming in today.  Please let me know if there are questions.

## 2022-07-29 DIAGNOSIS — Z23 Encounter for immunization: Secondary | ICD-10-CM | POA: Diagnosis not present

## 2022-07-30 ENCOUNTER — Other Ambulatory Visit: Payer: Self-pay | Admitting: Neurology

## 2022-07-31 ENCOUNTER — Telehealth (HOSPITAL_COMMUNITY): Payer: Self-pay | Admitting: Family Medicine

## 2022-07-31 NOTE — Telephone Encounter (Signed)
Left message for patient to call back and schedule Medicare Annual Wellness Visit (AWV) in office.   If not able to come in office, please offer to do virtually or by telephone.  Left office number and my jabber [phone].  Last AWV:07/23/2021   Please schedule at anytime with Nurse Health Advisor.

## 2022-08-04 ENCOUNTER — Ambulatory Visit: Payer: Medicare Other | Attending: Cardiology | Admitting: Cardiology

## 2022-08-04 ENCOUNTER — Encounter: Payer: Self-pay | Admitting: Cardiology

## 2022-08-04 VITALS — BP 136/78 | HR 62 | Ht 69.0 in | Wt 150.8 lb

## 2022-08-04 DIAGNOSIS — R072 Precordial pain: Secondary | ICD-10-CM

## 2022-08-04 DIAGNOSIS — R0602 Shortness of breath: Secondary | ICD-10-CM | POA: Diagnosis not present

## 2022-08-04 MED ORDER — ROSUVASTATIN CALCIUM 20 MG PO TABS: 20.0000 mg | ORAL_TABLET | Freq: Every day | ORAL | 3 refills | Status: DC

## 2022-08-04 MED ORDER — EZETIMIBE 10 MG PO TABS: 10.0000 mg | ORAL_TABLET | Freq: Every day | ORAL | 3 refills | Status: DC

## 2022-08-04 NOTE — Progress Notes (Signed)
Cardiology Office Note:    Date:  08/04/2022   ID:  Anthony Juarez, DOB Aug 03, 1945, MRN [id number]  PCP:  Wendie Agreste, MD  [facility] Cardiologist:  Candee Furbish, MD  [facility] Electrophysiologist:  None   Referring MD: Wendie Agreste, MD   History of Present Illness:    Anthony Juarez is a 77 y.o. male here for the follow-up of bradycardia, sensation of dyspnea, chest tightness, recent ER visit.  Went to the ER on 07/22/2022 with chest discomfort.  Troponin was normal.  EKG showed no ischemic changes. Notes reviewed.  Prior ER visit in 2020 with chest pain left-sided pressure when standing up in the afternoon relieved with rest.  His father had CABG at age 2.  He had been diagnosed with GERD in the past  Previously 16 miles a week hiking. 750 mile goal.  Enjoys the trails here in town.  Prior LDL between 75 and 80.  On high intensity Crestor.  Today: Overall he is feeling good. Occasionally he feels palpitations, especially while in bed at night.  Exercises regularly by hiking and walking. This week, he hiked about 20 miles.  He self-discontinued his aspirin due to easily bruising, but is amenable to re-starting.  Also, he endorses a degenerative disc, and has been on Meloxicam for 6 months.  Enjoyed a camping trip, hiking out in [place], [place], [place]  He denies any chest pain, or shortness of breath. No lightheadedness, headaches, syncope, orthopnea, PND, lower extremity edema or exertional symptoms.  Used to work as a Ship broker.   Past Medical History:  Diagnosis Date   Allergy    Arthritis    Asthma    childhood   AVM (arteriovenous malformation) brain 06/15/2013   Balanitis    Benign prostatic hypertrophy    Central sleep apnea    Cholecystitis with cholelithiasis    Chronic sinus bradycardia 07/09/2018   Convulsive disorder ([place])    Esophageal reflux 03/28/2015   History of nuclear stress test    Myoview 11/16: EF 60%,  normal perfusion, Low Risk   Hyperlipidemia    Prostatitis    Seizures ([place])     Past Surgical History:  Procedure Laterality Date   ELBOW SURGERY  11/11/2000   repair   FRACTURE SURGERY     LEFT ELBOW   TONSILLECTOMY      Current Medications: Current Meds  Medication Sig   albuterol (PROVENTIL HFA;VENTOLIN HFA) 108 (90 Base) MCG/ACT inhaler Inhale 2 puffs into the lungs every 4 (four) hours as needed for wheezing or shortness of breath (cough, shortness of breath or wheezing.).   aspirin EC 81 MG tablet Take 81 mg by mouth daily.   doxazosin (CARDURA) 2 MG tablet Take 1 tablet (2 mg total) by mouth daily.   finasteride (PROSCAR) 5 MG tablet Take 1 tablet (5 mg total) by mouth daily.   fluticasone (FLONASE) 50 MCG/ACT nasal spray USE 1 SPRAY IN EACH NOSTRIL TWICE A DAY.   LamoTRIgine 200 MG TB24 24 hour tablet TAKE ONE TABLET AT BEDTIME   Melatonin 1 MG/ML LIQD Take by mouth.   meloxicam (MOBIC) 7.5 MG tablet Take 1 tablet (7.5 mg total) by mouth daily. (Patient taking differently: Take 7.5 mg by mouth as needed.)   montelukast (SINGULAIR) 10 MG tablet TAKE ONE TABLET AT BEDTIME.   Multiple Vitamins-Minerals (MULTIVITAMIN WITH MINERALS) tablet Take 1 tablet by mouth daily.   omeprazole (PRILOSEC) 20 MG capsule Take 1 capsule (20 mg total)  by mouth daily.   [DISCONTINUED] ezetimibe (ZETIA) 10 MG tablet Take 1 tablet (10 mg total) by mouth daily.   [DISCONTINUED] rosuvastatin (CRESTOR) 20 MG tablet Take 1 tablet (20 mg total) by mouth daily.     Allergies:   Penicillins   Social History   Socioeconomic History   Marital status: Married    Spouse name: [name]   Number of children: Not on file   Years of education: Not on file   Highest education level: Bachelor's degree (e.g., BA, AB, BS)  Occupational History   Occupation: retired    Fish farm manager: RETIRED  Tobacco Use   Smoking status: Former    Packs/day: 1.00    Years: 10.00    Total pack years: 10.00    Types: Cigarettes     Quit date: 07/13/1977    Years since quitting: 45.0   Smokeless tobacco: Never  Vaping Use   Vaping Use: Never used  Substance and Sexual Activity   Alcohol use: Yes    Alcohol/week: 2.0 standard drinks of alcohol    Types: 2 Cans of beer per week    Comment: 2 beers a day   Drug use: No   Sexual activity: Not Currently    Birth control/protection: Post-menopausal, Abstinence  Other Topics Concern   Not on file  Social History Narrative   Patient worked in [employer].   Social Determinants of Health   Financial Resource Strain: Not on file  Food Insecurity: Not on file  Transportation Needs: Not on file  Physical Activity: Insufficiently Active (07/09/2018)   Exercise Vital Sign    Days of Exercise per Week: 4 days    Minutes of Exercise per Session: 30 min  Stress: Not on file  Social Connections: Not on file     Family History: The patient's family history includes Cancer in his father; Dementia in his mother; Heart disease in his father and maternal grandfather.  ROS:   Please see the history of present illness.    (+) Palpitations All other systems reviewed and are negative.  EKGs/Labs/Other Studies Reviewed:    Coronary Calcium Score 01/17/2019: Coronary calcium score of 166. This was 14 percentile for age and sex matched control.  Nuclear Stress Test 05/28/2015: The left ventricular ejection fraction is normal (55-65%). Nuclear stress EF: 60%. There was no ST segment deviation noted during stress. The study is normal. This is a low risk study.  ETT 05/22/2015: Blood pressure demonstrated a hypertensive response to exercise. ST segment depression was noted during stress.   Good exercise capacity.  No chest pain.  He did note some SOB at peak exercise.  Hypertensive BP response to exercise. There was borderline inf-lat ST depression at peak exercise (1 mm or less). Will arrange [employer].  FU with Dr. Candee Furbish as planned.  Echo  05/22/2015: - Left ventricle: The cavity size was normal. Wall thickness was    normal. Systolic function was normal. The estimated ejection    fraction was in the range of 55% to 60%. Wall motion was normal;    there were no regional wall motion abnormalities. Left    ventricular diastolic function parameters were normal. Normal    strain pattern, GLS -23.2%.  - Aortic valve: There was no stenosis.  - Mitral valve: Mildly calcified annulus. There was trivial    regurgitation.  - Right ventricle: The cavity size was normal. Systolic function    was normal.  - Pulmonary arteries: PA peak pressure: 28 mm  Hg (S).  - Inferior vena cava: The vessel was normal in size. The    respirophasic diameter changes were in the normal range (= 50%),    consistent with normal central venous pressure.  - Pericardium, extracardiac: A trivial pericardial effusion was    identified.   Impressions:  - Normal study.   EKG:  EKG is personally reviewed and interpreted. 05/05/2021: Sinus bradycardia. Rate 52 bpm. 04/30/2020: sinus bradycardia 55 with PACs  Recent Labs: 07/22/2022: B Natriuretic Peptide 86.4; BUN 19; Creatinine, Ser 0.99; Hemoglobin 13.7; Platelets 284; Potassium 4.1; Sodium 135 07/27/2022: ALT 20   Recent Lipid Panel    Component Value Date/Time   CHOL 153 07/27/2022 0957   CHOL 196 07/16/2020 1115   TRIG 60.0 07/27/2022 0957   HDL 74.40 07/27/2022 0957   HDL 92 07/16/2020 1115   CHOLHDL 2 07/27/2022 0957   VLDL 12.0 07/27/2022 0957   LDLCALC 67 07/27/2022 0957   LDLCALC 90 07/16/2020 1115      Physical Exam:    VS:  BP 136/78   Pulse 62   Ht '5\' 9"'$  (1.753 m)   Wt 150 lb 12.8 oz (68.4 kg)   SpO2 99%   BMI 22.27 kg/m     Wt Readings from Last 3 Encounters:  08/04/22 150 lb 12.8 oz (68.4 kg)  07/27/22 148 lb 3.2 oz (67.2 kg)  07/22/22 152 lb 6.4 oz (69.1 kg)     GEN:  Well nourished, well developed in no acute distress HEENT: Normal NECK: No JVD; No carotid  bruits LYMPHATICS: No lymphadenopathy CARDIAC: RRR, no murmurs, rubs, gallops RESPIRATORY:  Clear to auscultation without rales, wheezing or rhonchi  ABDOMEN: Soft, non-tender, non-distended MUSCULOSKELETAL:  No edema; No deformity  SKIN: Warm and dry NEUROLOGIC:  Alert and oriented x 3 PSYCHIATRIC:  Normal affect   ASSESSMENT:    1. Shortness of breath   2. Precordial chest pain      PLAN:    In order of problems listed above:  Dyspnea, tightness in chest precordial -ER 07/22/22. Trop normal. Can occur at rest. Doing 6 mile hike -Has been experiencing some dyspnea, sensation not being able to take in a deep breath. - Since it has been since 2016 since we have done any stress testing or echocardiogram, we will go ahead and repeat.  I will check an echocardiogram to ensure proper structure and function of his heart.  I will also check a cardiac PET given his underlying coronary artery disease/coronary artery calcification and slightly irregular heart rhythm given PACs on ECG which would preclude the use of coronary CT.  Chronic sinus bradycardia Stable heart rate in the 50s, PACs noted.  No high risk symptoms such as syncope.  Continue to avoid medication such as beta-blockers or calcium channel blockers like diltiazem.  Doing well.  Family history of early CAD Father had bypass surgery at age 52 continue with secondary risk factor prevention.  He had a calcium score in 2020 that was 152, 62 percentile.  On high intensity statin.  Aspirin 81 mg.  Mixed hyperlipidemia Continue with Crestor 20 mg a day.  No myalgias.  Doing well.  LDL at goal should be less than 70.  We will go ahead and add Zetia 10 mg to his Crestor 20 mg.  We will recheck lipid panel in 3 months with ALT.  Last LDL was 90.  We are shooting for a goal less than 70.  Follow-up:  12 months.  Medication Adjustments/Labs and  Tests Ordered: Current medicines are reviewed at length with the patient today.  Concerns  regarding medicines are outlined above.  Orders Placed This Encounter  Procedures   NM PET CT CARDIAC PERFUSION MULTI W/ABSOLUTE BLOODFLOW   ECHOCARDIOGRAM COMPLETE    Meds ordered this encounter  Medications   ezetimibe (ZETIA) 10 MG tablet    Sig: Take 1 tablet (10 mg total) by mouth daily.    Dispense:  90 tablet    Refill:  3   rosuvastatin (CRESTOR) 20 MG tablet    Sig: Take 1 tablet (20 mg total) by mouth daily.    Dispense:  90 tablet    Refill:  3     Patient Instructions  Medication Instructions:  The current medical regimen is effective;  continue present plan and medications.  *If you need a refill on your cardiac medications before your next appointment, please call your pharmacy*  Testing/Procedures: Your physician has requested that you have an echocardiogram. Echocardiography is a painless test that uses sound waves to create images of your heart. It provides your doctor with information about the size and shape of your heart and how well your heart's chambers and valves are working. This procedure takes approximately one hour. There are no restrictions for this procedure. Please do NOT wear cologne, perfume, aftershave, or lotions (deodorant is allowed). Please arrive 15 minutes prior to your appointment time.  How to Prepare for Your Cardiac PET/CT Stress Test:  1. Please do not take these medications before your test:   Medications that may interfere with the cardiac pharmacological stress agent (ex. nitrates - including erectile dysfunction medications, isosorbide mononitrate or beta-blockers) the day of the exam. (Erectile dysfunction medication should be held for at least 72 hrs prior to test) Theophylline containing medications for 12 hours. Dipyridamole 48 hours prior to the test. Your remaining medications may be taken with water.  2. Nothing to eat or drink, except water, 3 hours prior to arrival time.   NO caffeine/decaffeinated products, or  chocolate 12 hours prior to arrival.  3. NO perfume, cologne or lotion  4. Total time is 1 to 2 hours; you may want to bring reading material for the waiting time.  5. Please report to Admitting at the [facility] Main Entrance 30 minutes early for your test.  [place], [place] [address]  Diabetic Preparation:  Hold oral medications. You may take NPH and Lantus insulin. Do not take Humalog or Humulin R (Regular Insulin) the day of your test. Check blood sugars prior to leaving the house. If able to eat breakfast prior to 3 hour fasting, you may take all medications, including your insulin, Do not worry if you miss your breakfast dose of insulin - start at your next meal.  IF YOU THINK YOU MAY BE PREGNANT, OR ARE NURSING PLEASE INFORM THE TECHNOLOGIST.  In preparation for your appointment, medication and supplies will be purchased.  Appointment availability is limited, so if you need to cancel or reschedule, please call the Radiology Department at [phone]  24 hours in advance to avoid a cancellation fee of $100.00  What to Expect After you Arrive:  Once you arrive and check in for your appointment, you will be taken to a preparation room within the Radiology Department.  A technologist or Nurse will obtain your medical history, verify that you are correctly prepped for the exam, and explain the procedure.  Afterwards,  an IV will be started in your arm  and electrodes will be placed on your skin for EKG monitoring during the stress portion of the exam. Then you will be escorted to the PET/CT scanner.  There, staff will get you positioned on the scanner and obtain a blood pressure and EKG.  During the exam, you will continue to be connected to the EKG and blood pressure machines.  A small, safe amount of a radioactive tracer will be injected in your IV to obtain a series of pictures of your heart along with an injection of a stress agent.    After your  Exam:  It is recommended that you eat a meal and drink a caffeinated beverage to counter act any effects of the stress agent.  Drink plenty of fluids for the remainder of the day and urinate frequently for the first couple of hours after the exam.  Your doctor will inform you of your test results within 7-10 business days.  For questions about your test or how to prepare for your test, please call: Marchia Bond, Cardiac Imaging Nurse Navigator  Gordy Clement, Cardiac Imaging Nurse Navigator Office: [phone]  Follow-Up: At [facility], you and your health needs are our priority.  As part of our continuing mission to provide you with exceptional heart care, we have created designated Provider Care Teams.  These Care Teams include your primary Cardiologist (physician) and Advanced Practice Providers (APPs -  Physician Assistants and Nurse Practitioners) who all work together to provide you with the care you need, when you need it.  We recommend signing up for the patient portal called "MyChart".  Sign up information is provided on this After Visit Summary.  MyChart is used to connect with patients for Virtual Visits (Telemedicine).  Patients are able to view lab/test results, encounter notes, upcoming appointments, etc.  Non-urgent messages can be sent to your provider as well.   To learn more about what you can do with MyChart, go to [email or web address].    Your next appointment:   1 year(s)  Provider:   Candee Furbish, MD        [facility],acting as a scribe for Candee Furbish, MD.,have documented all relevant documentation on the behalf of Candee Furbish, MD,as directed by  Candee Furbish, MD while in the presence of Candee Furbish, MD.  I, Candee Furbish, MD, have reviewed all documentation for this visit. The documentation on 08/04/22 for the exam, diagnosis, procedures, and orders are all accurate and complete.   Signed, Candee Furbish, MD  08/04/2022 10:52 AM    [facility]

## 2022-08-04 NOTE — Patient Instructions (Signed)
Medication Instructions:  The current medical regimen is effective;  continue present plan and medications.  *If you need a refill on your cardiac medications before your next appointment, please call your pharmacy*  Testing/Procedures: Your physician has requested that you have an echocardiogram. Echocardiography is a painless test that uses sound waves to create images of your heart. It provides your doctor with information about the size and shape of your heart and how well your heart's chambers and valves are working. This procedure takes approximately one hour. There are no restrictions for this procedure. Please do NOT wear cologne, perfume, aftershave, or lotions (deodorant is allowed). Please arrive 15 minutes prior to your appointment time.  How to Prepare for Your Cardiac PET/CT Stress Test:  1. Please do not take these medications before your test:   Medications that may interfere with the cardiac pharmacological stress agent (ex. nitrates - including erectile dysfunction medications, isosorbide mononitrate or beta-blockers) the day of the exam. (Erectile dysfunction medication should be held for at least 72 hrs prior to test) Theophylline containing medications for 12 hours. Dipyridamole 48 hours prior to the test. Your remaining medications may be taken with water.  2. Nothing to eat or drink, except water, 3 hours prior to arrival time.   NO caffeine/decaffeinated products, or chocolate 12 hours prior to arrival.  3. NO perfume, cologne or lotion  4. Total time is 1 to 2 hours; you may want to bring reading material for the waiting time.  5. Please report to Admitting at the [facility] Main Entrance 30 minutes early for your test.  [place], [place] [address]  Diabetic Preparation:  Hold oral medications. You may take NPH and Lantus insulin. Do not take Humalog or Humulin R (Regular Insulin) the day of your test. Check blood sugars prior to  leaving the house. If able to eat breakfast prior to 3 hour fasting, you may take all medications, including your insulin, Do not worry if you miss your breakfast dose of insulin - start at your next meal.  IF YOU THINK YOU MAY BE PREGNANT, OR ARE NURSING PLEASE INFORM THE TECHNOLOGIST.  In preparation for your appointment, medication and supplies will be purchased.  Appointment availability is limited, so if you need to cancel or reschedule, please call the Radiology Department at [phone]  24 hours in advance to avoid a cancellation fee of $100.00  What to Expect After you Arrive:  Once you arrive and check in for your appointment, you will be taken to a preparation room within the Radiology Department.  A technologist or Nurse will obtain your medical history, verify that you are correctly prepped for the exam, and explain the procedure.  Afterwards,  an IV will be started in your arm and electrodes will be placed on your skin for EKG monitoring during the stress portion of the exam. Then you will be escorted to the PET/CT scanner.  There, staff will get you positioned on the scanner and obtain a blood pressure and EKG.  During the exam, you will continue to be connected to the EKG and blood pressure machines.  A small, safe amount of a radioactive tracer will be injected in your IV to obtain a series of pictures of your heart along with an injection of a stress agent.    After your Exam:  It is recommended that you eat a meal and drink a caffeinated beverage to counter act any effects of the stress agent.  Drink plenty of fluids for the remainder of the day and urinate frequently for the first couple of hours after the exam.  Your doctor will inform you of your test results within 7-10 business days.  For questions about your test or how to prepare for your test, please call: Marchia Bond, Cardiac Imaging Nurse Navigator  Gordy Clement, Cardiac Imaging Nurse Navigator Office:  [phone]  Follow-Up: At [facility], you and your health needs are our priority.  As part of our continuing mission to provide you with exceptional heart care, we have created designated Provider Care Teams.  These Care Teams include your primary Cardiologist (physician) and Advanced Practice Providers (APPs -  Physician Assistants and Nurse Practitioners) who all work together to provide you with the care you need, when you need it.  We recommend signing up for the patient portal called "MyChart".  Sign up information is provided on this After Visit Summary.  MyChart is used to connect with patients for Virtual Visits (Telemedicine).  Patients are able to view lab/test results, encounter notes, upcoming appointments, etc.  Non-urgent messages can be sent to your provider as well.   To learn more about what you can do with MyChart, go to [email or web address].    Your next appointment:   1 year(s)  Provider:   Candee Furbish, MD

## 2022-08-10 ENCOUNTER — Encounter: Payer: Self-pay | Admitting: Family Medicine

## 2022-08-10 ENCOUNTER — Telehealth (HOSPITAL_COMMUNITY): Payer: Self-pay | Admitting: *Deleted

## 2022-08-10 NOTE — Telephone Encounter (Signed)
Reaching out to patient to offer assistance regarding upcoming cardiac imaging study; pt verbalizes understanding of appt date/time, parking situation and where to check in, pre-test NPO status, and verified current allergies; name and call back number provided for further questions should they arise  Shalom Ware RN Navigator Cardiac Imaging [facility] [phone] office [phone] cell  Patient aware to avoid caffeine 12 hours prior to his cardiac PET scan. 

## 2022-08-11 ENCOUNTER — Telehealth: Payer: Self-pay | Admitting: Cardiology

## 2022-08-11 ENCOUNTER — Ambulatory Visit (HOSPITAL_COMMUNITY)
Admission: RE | Admit: 2022-08-11 | Discharge: 2022-08-11 | Disposition: A | Discharge status: Home / Self Care | Payer: Medicare Other | Source: Ambulatory Visit | Attending: Cardiology | Admitting: Cardiology

## 2022-08-11 DIAGNOSIS — R0602 Shortness of breath: Secondary | ICD-10-CM | POA: Diagnosis not present

## 2022-08-11 DIAGNOSIS — R072 Precordial pain: Secondary | ICD-10-CM

## 2022-08-11 DIAGNOSIS — R222 Localized swelling, mass and lump, trunk: Secondary | ICD-10-CM

## 2022-08-11 DIAGNOSIS — R9389 Abnormal findings on diagnostic imaging of other specified body structures: Secondary | ICD-10-CM

## 2022-08-11 LAB — NM PET CT CARDIAC PERFUSION MULTI W/ABSOLUTE BLOODFLOW
LV dias vol: 105 mL (ref 62–150)
LV sys vol: 48 mL
MBFR: 2.64
Nuc Rest EF: 54 %
Nuc Stress EF: 65 %
Peak HR: 81 {beats}/min
Rest HR: 57 {beats}/min
Rest MBF: 0.87 ml/g/min
Rest Nuclear Isotope Dose: 17.9 mCi
Rest perfusion cavity size (mL): 105 mL
ST Depression (mm): 0 mm
Stress MBF: 2.3 ml/g/min
Stress Nuclear Isotope Dose: 17.8 mCi
Stress perfusion cavity size (mL): 118 mL
TID: 1.03

## 2022-08-11 MED ORDER — REGADENOSON 0.4 MG/5ML IV SOLN
INTRAVENOUS | Status: AC
  Administered 2022-08-11: 0.4 mg via INTRAVENOUS
  Filled 2022-08-11: qty 5

## 2022-08-11 MED ORDER — RUBIDIUM RB82 GENERATOR (RUBYFILL)
25.0000 | PACK | Freq: Once | INTRAVENOUS | Status: AC
  Administered 2022-08-11: 17.8 via INTRAVENOUS

## 2022-08-11 MED ORDER — REGADENOSON 0.4 MG/5ML IV SOLN: 0.4000 mg | Freq: Once | INTRAVENOUS | Status: AC

## 2022-08-11 MED ORDER — RUBIDIUM RB82 GENERATOR (RUBYFILL)
25.0000 | PACK | Freq: Once | INTRAVENOUS | Status: AC
  Administered 2022-08-11: 17.9 via INTRAVENOUS

## 2022-08-11 NOTE — Telephone Encounter (Signed)
Anthony Juarez from [place] calling in to report MRI findings:  Low-density or cystic mass deep to serratus musculature along the RIGHT lateral chest wall, arises from or extends into the intercostal space is not well evaluated in the absence of contrast. This has enlarged since 2020 but presence on prior imaging suggests indolent process as does the lack of surrounding bony destruction. Nerve sheath tumor or cystic chest wall mass are considered. MRI with without contrast may be helpful for further evaluation.   Recommendation -- Possible MRI w/ & w/o contrast to follow up on finding, per radiologist. Acquanetta Belling to MD for review/advisement.

## 2022-08-11 NOTE — Telephone Encounter (Signed)
Erline Levine is calling to give  a call report on pt's CT results. Transferring to triage.

## 2022-08-11 NOTE — Progress Notes (Signed)
Patient presents for a cardiac PET stress test and tolerated procedure without incident. Patient maintained acceptable vital signs throughout the test and was offered caffeine after test.  Patient ambulated out of department with a steady gait.  

## 2022-08-12 NOTE — Telephone Encounter (Signed)
Let's order chest MRI with and without contrast per radiology recommendations. Candee Furbish, MD

## 2022-08-14 NOTE — Telephone Encounter (Signed)
Attempted to contact pt to discuss results and need for further testing. Left message on private voicemail MRI has been ordered and he show expect a call to be scheduled.  Requested he call back with any questions/concerns.

## 2022-08-25 NOTE — Telephone Encounter (Signed)
Pt has been scheduled for MRA chest c/s contrast for 08/26/22.

## 2022-08-26 ENCOUNTER — Telehealth: Payer: Self-pay | Admitting: Cardiology

## 2022-08-26 ENCOUNTER — Ambulatory Visit (HOSPITAL_COMMUNITY)
Admission: RE | Admit: 2022-08-26 | Discharge: 2022-08-26 | Disposition: A | Discharge status: Home / Self Care | Payer: Medicare Other | Source: Ambulatory Visit | Attending: Cardiology | Admitting: Cardiology

## 2022-08-26 ENCOUNTER — Encounter: Payer: Self-pay | Admitting: Cardiology

## 2022-08-26 ENCOUNTER — Other Ambulatory Visit: Payer: Self-pay | Admitting: Cardiology

## 2022-08-26 DIAGNOSIS — R222 Localized swelling, mass and lump, trunk: Secondary | ICD-10-CM | POA: Diagnosis not present

## 2022-08-26 DIAGNOSIS — R9389 Abnormal findings on diagnostic imaging of other specified body structures: Secondary | ICD-10-CM | POA: Diagnosis not present

## 2022-08-26 DIAGNOSIS — L723 Sebaceous cyst: Secondary | ICD-10-CM | POA: Diagnosis not present

## 2022-08-26 DIAGNOSIS — R918 Other nonspecific abnormal finding of lung field: Secondary | ICD-10-CM | POA: Diagnosis not present

## 2022-08-26 MED ORDER — GADOBUTROL 1 MMOL/ML IV SOLN
7.0000 mL | Freq: Once | INTRAVENOUS | Status: AC | PRN
  Administered 2022-08-26: 7 mL via INTRAVENOUS

## 2022-08-26 NOTE — Telephone Encounter (Signed)
Returned call to [place], she states she already changed the order from MR angio chest to MRI chest. [place] states she reviewed the chart, sent a message to the ordering provider and confirmed that MRI chest is the procedure that was needed.  No further assistance needed.

## 2022-08-26 NOTE — Telephone Encounter (Signed)
[place] from MRI called stating the order for the MRI was put in incorrect.  It needs to be for MRI Chest, not MRI Angio chest.  She just wants permission to change it.  She said she already checked with pre-cert and prior auth isn't required.

## 2022-08-31 ENCOUNTER — Ambulatory Visit (HOSPITAL_COMMUNITY): Payer: Medicare Other | Attending: Cardiology

## 2022-08-31 DIAGNOSIS — R0602 Shortness of breath: Secondary | ICD-10-CM | POA: Diagnosis not present

## 2022-08-31 DIAGNOSIS — R072 Precordial pain: Secondary | ICD-10-CM | POA: Diagnosis not present

## 2022-08-31 LAB — ECHOCARDIOGRAM COMPLETE
Area-P 1/2: 3.46 cm2
S' Lateral: 2.7 cm

## 2022-09-04 ENCOUNTER — Ambulatory Visit (INDEPENDENT_AMBULATORY_CARE_PROVIDER_SITE_OTHER): Payer: Medicare Other | Admitting: Family Medicine

## 2022-09-04 ENCOUNTER — Encounter: Payer: Self-pay | Admitting: Family Medicine

## 2022-09-04 VITALS — BP 122/62 | HR 64 | Temp 97.9°F | Resp 16 | Ht 69.0 in | Wt 154.5 lb

## 2022-09-04 DIAGNOSIS — R300 Dysuria: Secondary | ICD-10-CM

## 2022-09-04 LAB — POCT URINALYSIS DIPSTICK
Blood, UA: NEGATIVE
Glucose, UA: NEGATIVE
Leukocytes, UA: NEGATIVE
Protein, UA: NEGATIVE
Spec Grav, UA: 1.015 (ref 1.010–1.025)
Urobilinogen, UA: 0.2 E.U./dL
pH, UA: 6 (ref 5.0–8.0)

## 2022-09-04 MED ORDER — CLOTRIMAZOLE 1 % EX CREA: 1.0000 | TOPICAL_CREAM | Freq: Two times a day (BID) | CUTANEOUS | 0 refills | Status: DC

## 2022-09-04 NOTE — Patient Instructions (Signed)
We'll notify you of your urine culture and make any changes if needed START the Clotrimazole cream twice daily to treat any possible yeast Continue to keep the area clean and dry Call with any questions or concerns Hang in there!!!

## 2022-09-04 NOTE — Progress Notes (Signed)
   Subjective:    Patient ID: Anthony Juarez, male    DOB: 09/16/1945, 78 y.o.   MRN: [id number]  HPI Burning w/ urination- sxs started ~3 days ago.  Sxs occur w/ each void.  Denies increased frequency.  Pt has BPH so does not empty completely and tends to 'dribble a lot'.  No fever.  Hx of similar 'ages ago'.  No new sexual partners or concern for STIs.  No redness of penis or urethra.     Review of Systems For ROS see HPI     Objective:   Physical Exam Vitals reviewed. Exam conducted with a chaperone present.  Constitutional:      General: He is not in acute distress.    Appearance: Normal appearance. He is not ill-appearing.  Genitourinary:    Penis: Erythema (just inside urethral meatus on L side and on the edge of the underside) and tenderness (mild TTP over erythematous areas around urethral meatus) present.   Neurological:     Mental Status: He is alert.           Assessment & Plan:  Dysuria- new.  Pt's UA WNL but will send for cx based on sxs.  Given his reported dribbling and the erythema and tenderness present, I suspect yeast.  Will start topical Clotrimazole and see if sxs improve.  Pt expressed understanding and is in agreement w/ plan.

## 2022-09-28 ENCOUNTER — Ambulatory Visit (INDEPENDENT_AMBULATORY_CARE_PROVIDER_SITE_OTHER): Payer: Medicare Other | Admitting: Family Medicine

## 2022-09-28 ENCOUNTER — Encounter: Payer: Self-pay | Admitting: Family Medicine

## 2022-09-28 VITALS — BP 122/64 | HR 76 | Temp 98.7°F | Ht 69.0 in | Wt 151.0 lb

## 2022-09-28 DIAGNOSIS — R3914 Feeling of incomplete bladder emptying: Secondary | ICD-10-CM

## 2022-09-28 DIAGNOSIS — E782 Mixed hyperlipidemia: Secondary | ICD-10-CM

## 2022-09-28 DIAGNOSIS — N401 Enlarged prostate with lower urinary tract symptoms: Secondary | ICD-10-CM | POA: Diagnosis not present

## 2022-09-28 DIAGNOSIS — R351 Nocturia: Secondary | ICD-10-CM | POA: Diagnosis not present

## 2022-09-28 DIAGNOSIS — L989 Disorder of the skin and subcutaneous tissue, unspecified: Secondary | ICD-10-CM | POA: Diagnosis not present

## 2022-09-28 DIAGNOSIS — G4731 Primary central sleep apnea: Secondary | ICD-10-CM | POA: Diagnosis not present

## 2022-09-28 NOTE — Patient Instructions (Addendum)
I will refer you to general surgery to discuss the area on the chest wall and options on follow-up or ultrasound imaging. Discussed the sleep apnea and possible repeat testing or treatment options with neuro.  Let me know if they need a referral to sleep specialist. Follow-up with urology regarding the nighttime urination - that could be related in part to prostate or sleep apnea.  No med changes for today.  Shingles vaccine at your pharmacy when ready.  Let me know if there are questions.  Recheck in 4 months to review meds, labs at that time.  Thanks for coming in today.

## 2022-09-28 NOTE — Progress Notes (Unsigned)
Subjective:  Patient ID: Anthony Juarez, male    DOB: Jan 19, 1946  Age: 77 y.o. MRN: [id number]  CC:  Chief Complaint  Patient presents with   Hyperlipidemia   chest wall lesion   Dysuria    HPI Anthony Juarez presents for follow up.   Less hiking overall this year, but hike planned in August. Anthony Juarez. 5-8 mile hike, [place]   Cardiac:  History of bradycardia, dyspnea, chest tightness.  Evaluated by Dr. Marlou Porch in January after ER visit with normal troponin.  PET cardiac CT 08/11/22: Normal cardiac portion of cardiac PET stress test.  Normal low risk with no evidence of ischemia.Coronary calcifications were multivessel. Remains on Crestor 20 mg daily for history of hyperlipidemia. No recent chest pain or pressure. No new side effects.   Lab Results  Component Value Date   CHOL 153 07/27/2022   HDL 74.40 07/27/2022   LDLCALC 67 07/27/2022   TRIG 60.0 07/27/2022   CHOLHDL 2 07/27/2022   Lab Results  Component Value Date   ALT 20 07/27/2022   AST 33 07/27/2022   ALKPHOS 56 07/27/2022   BILITOT 0.8 07/27/2022       Chest wall lesion: Initially noted at his PET/CT cardiac perfusion scan January 30.  Right lateral chest wall low-density or cystic mass.  MRI of the chest with and without contrast on 08/26/2022, complex cystic appearing mass in the right lateral chest wall corresponding to lesion on CT.  Features favor benign/indolent etiology such as a lymphangioma.  No other suspicious soft tissue lesions or acute osseous findings identified. Reports this has been evaluated years ago and seen by a surgeon at that time without apparent need to have removed. Maybe slightly bigger since that time. 11/08/2018- suspected lipoma of torso, referred to general surgery and seen in June of that year. Slightly enlarged from CT in July 2020.    BPH: Treated with finasteride, doxazosin at his January visit.  Referred to urology. Recently seen by my  colleague on February 23 for dysuria, urinalysis normal.  Treated with topical clotrimazole for possible mycotic infection. Improved in 2 days, urinating well now.  Has not yet scheduled visit with urology. Plans to schedule.  Nocturia 2-3x/night. Unsure of benefit with doxazosin, finasteride.  Hx of OSA, unable to tolerate bipap. Has not met with sleep specialist recently. Does have neuro follow up in 3 weeks for follow up memory, seizure d/o. Last moca 30/30. No seizure since 1992. Plans to discuss sleep and prior OSA with neuro at upcoming follow up.   HM: discussed shingrix at pharmacy.    History Patient Active Problem List   Diagnosis Date Noted   Family history of early CAD 05/05/2021   Mild cognitive impairment 06/27/2020   Chronic sinus bradycardia 07/09/2018   Abnormal bowel movement 07/09/2018   Hyperinflation of lungs 07/09/2018   Non-seasonal allergic rhinitis 07/09/2018   Abnormal finding on MRI of brain 12/04/2016   Benign prostatic hyperplasia with incomplete bladder emptying 11/11/2016   Mixed hyperlipidemia 05/13/2016   Esophageal reflux 03/28/2015   Shortness of breath 03/28/2015   Skin lesion of face 03/28/2015   AVM (arteriovenous malformation) brain 06/15/2013   Seizure disorder ([place]) 01/19/2013   Central sleep apnea 06/10/2012   Epigastric pain 06/06/2012   Gallstones 06/06/2012   Nausea 06/06/2012   Past Medical History:  Diagnosis Date   Allergy    Arthritis    Asthma    childhood   AVM (  arteriovenous malformation) brain 06/15/2013   Balanitis    Benign prostatic hypertrophy    Central sleep apnea    Cholecystitis with cholelithiasis    Chronic sinus bradycardia 07/09/2018   Convulsive disorder ([place])    Esophageal reflux 03/28/2015   History of nuclear stress test    Myoview 11/16: EF 60%, normal perfusion, Low Risk   Hyperlipidemia    Prostatitis    Seizures ([place])    Past Surgical History:  Procedure Laterality Date   ELBOW SURGERY   11/11/2000   repair   FRACTURE SURGERY     LEFT ELBOW   TONSILLECTOMY     Allergies  Allergen Reactions   Penicillins Rash    Did it involve swelling of the face/tongue/throat, SOB, or low BP? No Did it involve sudden or severe rash/hives, skin peeling, or any reaction on the inside of your mouth or nose? Yes Did you need to seek medical attention at a hospital or doctor's office? No When did it last happen?  Decades ago. If all above answers are "NO", may proceed with cephalosporin use.    Prior to Admission medications   Medication Sig Start Date End Date Taking? Authorizing Provider  albuterol (PROVENTIL HFA;VENTOLIN HFA) 108 (90 Base) MCG/ACT inhaler Inhale 2 puffs into the lungs every 4 (four) hours as needed for wheezing or shortness of breath (cough, shortness of breath or wheezing.). 06/01/17  Yes Shawnee Knapp, MD  aspirin EC 81 MG tablet Take 81 mg by mouth daily.   Yes [provider]  clotrimazole (LOTRIMIN) 1 % cream Apply 1 Application topically 2 (two) times daily. 09/04/22  Yes Midge Minium, MD  doxazosin (CARDURA) 2 MG tablet Take 1 tablet (2 mg total) by mouth daily. 07/27/22  Yes Wendie Agreste, MD  ezetimibe (ZETIA) 10 MG tablet Take 1 tablet (10 mg total) by mouth daily. 08/04/22  Yes Jerline Pain, MD  finasteride (PROSCAR) 5 MG tablet Take 1 tablet (5 mg total) by mouth daily. 07/27/22  Yes Wendie Agreste, MD  fluticasone [facility]) 50 MCG/ACT nasal spray USE 1 SPRAY IN EACH NOSTRIL TWICE A DAY. 03/17/22  Yes Wendie Agreste, MD  LamoTRIgine 200 MG TB24 24 hour tablet TAKE ONE TABLET AT BEDTIME 08/03/22  Yes Marcial Pacas, MD  Melatonin 1 MG/ML LIQD Take by mouth.   Yes [provider]  meloxicam (MOBIC) 7.5 MG tablet Take 1 tablet (7.5 mg total) by mouth daily. 02/05/21  Yes Wendie Agreste, MD  montelukast (SINGULAIR) 10 MG tablet TAKE ONE TABLET AT BEDTIME. 07/02/22  Yes Wendie Agreste, MD  Multiple Vitamins-Minerals (MULTIVITAMIN WITH  MINERALS) tablet Take 1 tablet by mouth daily.   Yes [provider]  omeprazole (PRILOSEC) 20 MG capsule Take 1 capsule (20 mg total) by mouth daily. 01/22/22  Yes Wendie Agreste, MD  rosuvastatin (CRESTOR) 20 MG tablet Take 1 tablet (20 mg total) by mouth daily. 08/04/22  Yes Jerline Pain, MD   Social History   Socioeconomic History   Marital status: Married    Spouse name: [name]   Number of children: Not on file   Years of education: Not on file   Highest education level: Bachelor's degree (e.g., BA, AB, BS)  Occupational History   Occupation: retired    Fish farm manager: RETIRED  Tobacco Use   Smoking status: Former    Packs/day: 1.00    Years: 10.00    Additional pack years: 0.00    Total pack years:  10.00    Types: Cigarettes    Quit date: 07/13/1977    Years since quitting: 45.2   Smokeless tobacco: Never  Vaping Use   Vaping Use: Never used  Substance and Sexual Activity   Alcohol use: Yes    Alcohol/week: 2.0 standard drinks of alcohol    Types: 2 Cans of beer per week    Comment: 2 beers a day   Drug use: No   Sexual activity: Not Currently    Birth control/protection: Post-menopausal, Abstinence  Other Topics Concern   Not on file  Social History Narrative   Patient worked in [employer].   Social Determinants of Health   Financial Resource Strain: Not on file  Food Insecurity: Not on file  Transportation Needs: Not on file  Physical Activity: Insufficiently Active (07/09/2018)   Exercise Vital Sign    Days of Exercise per Week: 4 days    Minutes of Exercise per Session: 30 min  Stress: Not on file  Social Connections: Not on file  Intimate Partner Violence: Not on file    Review of Systems   Objective:   Vitals:   09/28/22 1357  BP: 122/64  Pulse: 76  Temp: 98.7 F (37.1 C)  TempSrc: Temporal  SpO2: 99%  Weight: 151 lb (68.5 kg)  Height: 5\' 9"  (1.753 m)   {Vitals History (Optional):23777}  Physical Exam Vitals reviewed.   Constitutional:      Appearance: He is well-developed.  HENT:     Head: Normocephalic and atraumatic.  Neck:     Vascular: No carotid bruit or JVD.  Cardiovascular:     Rate and Rhythm: Normal rate and regular rhythm.     Heart sounds: Normal heart sounds. No murmur heard. Pulmonary:     Effort: Pulmonary effort is normal.     Breath sounds: Normal breath sounds. No rales.  Musculoskeletal:     Right lower leg: No edema.     Left lower leg: No edema.  Skin:    General: Skin is warm and dry.       Neurological:     Mental Status: He is alert and oriented to person, place, and time.  Psychiatric:        Mood and Affect: Mood normal.     Assessment & Plan:  Izhar Tesfaye is a 77 y.o. male . No diagnosis found.   No orders of the defined types were placed in this encounter.  There are no Patient Instructions on file for this visit.    Signed,   Merri Ray, MD [place] 09/28/22 2:30 PM

## 2022-10-01 ENCOUNTER — Telehealth: Payer: Self-pay | Admitting: Family Medicine

## 2022-10-01 NOTE — Telephone Encounter (Signed)
Called patient to schedule Medicare Annual Wellness Visit (AWV). Left message for patient to call back and schedule Medicare Annual Wellness Visit (AWV).  Last date of AWV: 07/23/2021   Please schedule an AWVS appointment at any time with [facility] [facility].  If any questions, please contact me at [phone].    Thank you,  [place] Direct dial  [phone]

## 2022-10-16 ENCOUNTER — Telehealth: Payer: Self-pay | Admitting: Family Medicine

## 2022-10-16 NOTE — Telephone Encounter (Signed)
Contacted Anthony Juarez to schedule their annual wellness visit. Appointment made for 10/21/2022.  Thank you,  [facility] Support [address] [facility] Direct dial  [phone]

## 2022-10-21 ENCOUNTER — Encounter: Payer: Self-pay | Admitting: Neurology

## 2022-10-21 ENCOUNTER — Telehealth: Payer: Self-pay | Admitting: Neurology

## 2022-10-21 ENCOUNTER — Ambulatory Visit (INDEPENDENT_AMBULATORY_CARE_PROVIDER_SITE_OTHER): Payer: Medicare Other | Admitting: *Deleted

## 2022-10-21 ENCOUNTER — Ambulatory Visit (INDEPENDENT_AMBULATORY_CARE_PROVIDER_SITE_OTHER): Payer: Medicare Other | Admitting: Neurology

## 2022-10-21 VITALS — BP 138/82 | HR 76 | Ht 69.0 in | Wt 148.0 lb

## 2022-10-21 DIAGNOSIS — G4731 Primary central sleep apnea: Secondary | ICD-10-CM | POA: Diagnosis not present

## 2022-10-21 DIAGNOSIS — Z0000 Encounter for general adult medical examination without abnormal findings: Secondary | ICD-10-CM

## 2022-10-21 DIAGNOSIS — G3184 Mild cognitive impairment, so stated: Secondary | ICD-10-CM

## 2022-10-21 DIAGNOSIS — G40909 Epilepsy, unspecified, not intractable, without status epilepticus: Secondary | ICD-10-CM

## 2022-10-21 MED ORDER — LAMOTRIGINE ER 200 MG PO TB24: 1.0000 | ORAL_TABLET | Freq: Every day | ORAL | 4 refills | Status: DC

## 2022-10-21 NOTE — Telephone Encounter (Signed)
medicare NPR sent to GI [phone] 

## 2022-10-21 NOTE — Patient Instructions (Signed)
Anthony Juarez , Thank you for taking time to come for your Medicare Wellness Visit. I appreciate your ongoing commitment to your health goals. Please review the following plan we discussed and let me know if I can assist you in the future.   Screening recommendations/referrals: Colonoscopy: up to date Recommended yearly ophthalmology/optometry visit for glaucoma screening and checkup Recommended yearly dental visit for hygiene and checkup  Vaccinations: Influenza vaccine: up to date Pneumococcal vaccine: up to date Tdap vaccine: up to date Shingles vaccine: Education provided    Advanced directives: Education provided      Preventive Care 65 Years and Older, Male Preventive care refers to lifestyle choices and visits with your health care provider that can promote health and wellness. What does preventive care include? A yearly physical exam. This is also called an annual well check. Dental exams once or twice a year. Routine eye exams. Ask your health care provider how often you should have your eyes checked. Personal lifestyle choices, including: Daily care of your teeth and gums. Regular physical activity. Eating a healthy diet. Avoiding tobacco and drug use. Limiting alcohol use. Practicing safe sex. Taking low doses of aspirin every day. Taking vitamin and mineral supplements as recommended by your health care provider. What happens during an annual well check? The services and screenings done by your health care provider during your annual well check will depend on your age, overall health, lifestyle risk factors, and family history of disease. Counseling  Your health care provider may ask you questions about your: Alcohol use. Tobacco use. Drug use. Emotional well-being. Home and relationship well-being. Sexual activity. Eating habits. History of falls. Memory and ability to understand (cognition). Work and work Astronomer. Screening  You may have the  following tests or measurements: Height, weight, and BMI. Blood pressure. Lipid and cholesterol levels. These may be checked every 5 years, or more frequently if you are over 28 years old. Skin check. Lung cancer screening. You may have this screening every year starting at age 26 if you have a 30-pack-year history of smoking and currently smoke or have quit within the past 15 years. Fecal occult blood test (FOBT) of the stool. You may have this test every year starting at age 81. Flexible sigmoidoscopy or colonoscopy. You may have a sigmoidoscopy every 5 years or a colonoscopy every 10 years starting at age 31. Prostate cancer screening. Recommendations will vary depending on your family history and other risks. Hepatitis C blood test. Hepatitis B blood test. Sexually transmitted disease (STD) testing. Diabetes screening. This is done by checking your blood sugar (glucose) after you have not eaten for a while (fasting). You may have this done every 1-3 years. Abdominal aortic aneurysm (AAA) screening. You may need this if you are a current or former smoker. Osteoporosis. You may be screened starting at age 12 if you are at high risk. Talk with your health care provider about your test results, treatment options, and if necessary, the need for more tests. Vaccines  Your health care provider may recommend certain vaccines, such as: Influenza vaccine. This is recommended every year. Tetanus, diphtheria, and acellular pertussis (Tdap, Td) vaccine. You may need a Td booster every 10 years. Zoster vaccine. You may need this after age 43. Pneumococcal 13-valent conjugate (PCV13) vaccine. One dose is recommended after age 28. Pneumococcal polysaccharide (PPSV23) vaccine. One dose is recommended after age 70. Talk to your health care provider about which screenings and vaccines you need and how often you need  them. This information is not intended to replace advice given to you by your health care  provider. Make sure you discuss any questions you have with your health care provider. Document Released: 07/26/2015 Document Revised: 03/18/2016 Document Reviewed: 04/30/2015 Elsevier Interactive Patient Education  2017 [employer].  Fall Prevention in the Home Falls can cause injuries. They can happen to people of all ages. There are many things you can do to make your home safe and to help prevent falls. What can I do on the outside of my home? Regularly fix the edges of walkways and driveways and fix any cracks. Remove anything that might make you trip as you walk through a door, such as a raised step or threshold. Trim any bushes or trees on the path to your home. Use bright outdoor lighting. Clear any walking paths of anything that might make someone trip, such as rocks or tools. Regularly check to see if handrails are loose or broken. Make sure that both sides of any steps have handrails. Any raised decks and porches should have guardrails on the edges. Have any leaves, snow, or ice cleared regularly. Use sand or salt on walking paths during winter. Clean up any spills in your garage right away. This includes oil or grease spills. What can I do in the bathroom? Use night lights. Install grab bars by the toilet and in the tub and shower. Do not use towel bars as grab bars. Use non-skid mats or decals in the tub or shower. If you need to sit down in the shower, use a plastic, non-slip stool. Keep the floor dry. Clean up any water that spills on the floor as soon as it happens. Remove soap buildup in the tub or shower regularly. Attach bath mats securely with double-sided non-slip rug tape. Do not have throw rugs and other things on the floor that can make you trip. What can I do in the bedroom? Use night lights. Make sure that you have a light by your bed that is easy to reach. Do not use any sheets or blankets that are too big for your bed. They should not hang down onto the  floor. Have a firm chair that has side arms. You can use this for support while you get dressed. Do not have throw rugs and other things on the floor that can make you trip. What can I do in the kitchen? Clean up any spills right away. Avoid walking on wet floors. Keep items that you use a lot in easy-to-reach places. If you need to reach something above you, use a strong step stool that has a grab bar. Keep electrical cords out of the way. Do not use floor polish or wax that makes floors slippery. If you must use wax, use non-skid floor wax. Do not have throw rugs and other things on the floor that can make you trip. What can I do with my stairs? Do not leave any items on the stairs. Make sure that there are handrails on both sides of the stairs and use them. Fix handrails that are broken or loose. Make sure that handrails are as long as the stairways. Check any carpeting to make sure that it is firmly attached to the stairs. Fix any carpet that is loose or worn. Avoid having throw rugs at the top or bottom of the stairs. If you do have throw rugs, attach them to the floor with carpet tape. Make sure that you have a light switch at  the top of the stairs and the bottom of the stairs. If you do not have them, ask someone to add them for you. What else can I do to help prevent falls? Wear shoes that: Do not have high heels. Have rubber bottoms. Are comfortable and fit you well. Are closed at the toe. Do not wear sandals. If you use a stepladder: Make sure that it is fully opened. Do not climb a closed stepladder. Make sure that both sides of the stepladder are locked into place. Ask someone to hold it for you, if possible. Clearly mark and make sure that you can see: Any grab bars or handrails. First and last steps. Where the edge of each step is. Use tools that help you move around (mobility aids) if they are needed. These include: Canes. Walkers. Scooters. Crutches. Turn on the  lights when you go into a dark area. Replace any light bulbs as soon as they burn out. Set up your furniture so you have a clear path. Avoid moving your furniture around. If any of your floors are uneven, fix them. If there are any pets around you, be aware of where they are. Review your medicines with your doctor. Some medicines can make you feel dizzy. This can increase your chance of falling. Ask your doctor what other things that you can do to help prevent falls. This information is not intended to replace advice given to you by your health care provider. Make sure you discuss any questions you have with your health care provider. Document Released: 04/25/2009 Document Revised: 12/05/2015 Document Reviewed: 08/03/2014 Elsevier Interactive Patient Education  2017 [employer].

## 2022-10-21 NOTE — Progress Notes (Signed)
Subjective:   Anthony Juarez is a 77 y.o. male who presents for Medicare Annual/Subsequent preventive examination.  I connected with  Anthony Juarez on 10/21/22 by a telephone enabled telemedicine application and verified that I am speaking with the correct person using two identifiers.   I discussed the limitations of evaluation and management by telemedicine. The patient expressed understanding and agreed to proceed.  Patient location: home  Provider location: Telephone-home    Review of Systems     Cardiac Risk Factors include: advanced age (>5men, >6 women);male gender;family history of premature cardiovascular disease     Objective:    Today's Vitals   There is no height or weight on file to calculate BMI.     10/21/2022    1:10 PM 07/23/2021    2:58 PM 02/03/2021    3:42 PM 07/13/2019    8:16 AM 11/11/2018   10:12 PM 05/17/2017   10:34 AM 11/12/2016   10:18 AM  Advanced Directives  Does Patient Have a Medical Advance Directive? Yes Yes No Yes Yes Yes Yes  Type of Advance Directive Living will    Living will Healthcare Power of [employer];Living will Healthcare Power of [place];Living will;Out of facility DNR (pink MOST or yellow form)  Does patient want to make changes to medical advance directive?  No - Patient declined       Copy of Healthcare Power of Attorney in Chart? No - copy requested     No - copy requested No - copy requested  Would patient like information on creating a medical advance directive?   No - Patient declined        Current Medications (verified) Outpatient Encounter Medications as of 10/21/2022  Medication Sig   albuterol (PROVENTIL HFA;VENTOLIN HFA) 108 (90 Base) MCG/ACT inhaler Inhale 2 puffs into the lungs every 4 (four) hours as needed for wheezing or shortness of breath (cough, shortness of breath or wheezing.).   aspirin EC 81 MG tablet Take 81 mg by mouth daily.   doxazosin (CARDURA) 2 MG tablet Take 1 tablet (2 mg total) by mouth  daily.   ezetimibe (ZETIA) 10 MG tablet Take 1 tablet (10 mg total) by mouth daily.   finasteride (PROSCAR) 5 MG tablet Take 1 tablet (5 mg total) by mouth daily.   fluticasone (FLONASE) 50 MCG/ACT nasal spray USE 1 SPRAY IN EACH NOSTRIL TWICE A DAY.   LamoTRIgine 200 MG TB24 24 hour tablet Take 1 tablet (200 mg total) by mouth at bedtime.   Melatonin 1 MG/ML LIQD Take by mouth.   montelukast (SINGULAIR) 10 MG tablet TAKE ONE TABLET AT BEDTIME.   Multiple Vitamins-Minerals (MULTIVITAMIN WITH MINERALS) tablet Take 1 tablet by mouth daily.   omeprazole (PRILOSEC) 20 MG capsule Take 1 capsule (20 mg total) by mouth daily.   rosuvastatin (CRESTOR) 20 MG tablet Take 1 tablet (20 mg total) by mouth daily.   clotrimazole (LOTRIMIN) 1 % cream Apply 1 Application topically 2 (two) times daily.   meloxicam (MOBIC) 7.5 MG tablet Take 1 tablet (7.5 mg total) by mouth daily.   No facility-administered encounter medications on file as of 10/21/2022.    Allergies (verified) Penicillins   History: Past Medical History:  Diagnosis Date   Allergy    Arthritis    Asthma    childhood   AVM (arteriovenous malformation) brain 06/15/2013   Balanitis    Benign prostatic hypertrophy    Central sleep apnea    Cholecystitis with cholelithiasis    Chronic sinus  bradycardia 07/09/2018   Convulsive disorder    Esophageal reflux 03/28/2015   History of nuclear stress test    Myoview 11/16: EF 60%, normal perfusion, Low Risk   Hyperlipidemia    Prostatitis    Seizures    Past Surgical History:  Procedure Laterality Date   ELBOW SURGERY  11/11/2000   repair   FRACTURE SURGERY     LEFT ELBOW   TONSILLECTOMY     Family History  Problem Relation Age of Onset   Heart disease Father        cabg 72's   Cancer Father        Prostate   Heart disease Maternal Grandfather        Late 55's   Dementia Mother    Social History   Socioeconomic History   Marital status: Married    Spouse name: Natasha Mead    Number of children: Not on file   Years of education: Not on file   Highest education level: Bachelor's degree (e.g., BA, AB, BS)  Occupational History   Occupation: retired    Associate Professor: RETIRED  Tobacco Use   Smoking status: Former    Packs/day: 1.00    Years: 10.00    Additional pack years: 0.00    Total pack years: 10.00    Types: Cigarettes    Quit date: 07/13/1977    Years since quitting: 45.3   Smokeless tobacco: Never  Vaping Use   Vaping Use: Never used  Substance and Sexual Activity   Alcohol use: Yes    Alcohol/week: 2.0 standard drinks of alcohol    Types: 2 Cans of beer per week    Comment: 2 beers a day   Drug use: No   Sexual activity: Not Currently    Birth control/protection: Post-menopausal, Abstinence  Other Topics Concern   Not on file  Social History Narrative   Patient worked in Engineer, materials.   Social Determinants of Health   Financial Resource Strain: Low Risk  (10/21/2022)   Overall Financial Resource Strain (CARDIA)    Difficulty of Paying Living Expenses: Not hard at all  Food Insecurity: No Food Insecurity (10/21/2022)   Hunger Vital Sign    Worried About Running Out of Food in the Last Year: Never true    Ran Out of Food in the Last Year: Never true  Transportation Needs: No Transportation Needs (10/21/2022)   PRAPARE - Administrator, Civil Service (Medical): No    Lack of Transportation (Non-Medical): No  Physical Activity: Sufficiently Active (10/21/2022)   Exercise Vital Sign    Days of Exercise per Week: 5 days    Minutes of Exercise per Session: 60 min  Stress: No Stress Concern Present (10/21/2022)   [employer] of Occupational Health - Occupational Stress Questionnaire    Feeling of Stress : Not at all  Social Connections: Moderately Integrated (10/21/2022)   Social Connection and Isolation Panel [NHANES]    Frequency of Communication with Friends and Family: Once a week    Frequency of Social Gatherings with  Friends and Family: Twice a week    Attends Religious Services: Never    Database administrator or Organizations: Yes    Attends Engineer, structural: More than 4 times per year    Marital Status: Married    Tobacco Counseling Counseling given: Not Answered   Clinical Intake:  Pre-visit preparation completed: Yes  Pain : No/denies pain     Nutritional Risks: None Diabetes:  No  How often do you need to have someone help you when you read instructions, pamphlets, or other written materials from your doctor or pharmacy?: 1 - Never  Diabetic?  no  Interpreter Needed?: No  Information entered by :: Remi HaggardJulie Dawn Convery LPN   Activities of Daily Living    10/21/2022    1:14 PM 10/20/2022    2:48 PM  In your present state of health, do you have any difficulty performing the following activities:  Hearing? 0 0  Vision? 1 1  Difficulty concentrating or making decisions? 0 1  Walking or climbing stairs? 0 0  Dressing or bathing? 0 0  Doing errands, shopping? 0 0  Preparing Food and eating ? N N  Using the Toilet? N N  In the past six months, have you accidently leaked urine? Y Y  Do you have problems with loss of bowel control? N N  Managing your Medications? N N  Managing your Finances? N N  Housekeeping or managing your Housekeeping? N N    Patient Care Team: Shade FloodGreene, Jeffrey R, MD as PCP - General (Family Medicine) Jake BatheSkains, Mark C, MD as PCP - Cardiology (Cardiology) Levert FeinsteinYan, Yijun, MD as Consulting Physician (Neurology) Stephannie LiSanders, Jason, MD as Consulting Physician (Ophthalmology) [facility] as Consulting Physician Jeani HawkingHung, Patrick, MD as Consulting Physician (Gastroenterology)  Indicate any recent Medical Services you may have received from other than [facility] providers in the past year (date may be approximate).     Assessment:   This is a routine wellness examination for Loistine Chancehilip.  Hearing/Vision screen Hearing Screening - Comments:: No trouble hearing Vision  Screening - Comments:: Not up to date Macular degeneration Will schedule [facility]  Dietary issues and exercise activities discussed: Current Exercise Habits: Home exercise routine, Type of exercise: Other - see comments, Time (Minutes): 60, Frequency (Times/Week): 5, Weekly Exercise (Minutes/Week): 300, Intensity: Moderate   Goals Addressed             This Visit's Progress    improve prostate health   On track    Patient states that he wants to try improve his prostate health.      Patient Stated       Continue current lifestyle       Depression Screen    10/21/2022    1:12 PM 09/28/2022    2:01 PM 09/04/2022    8:44 AM 03/11/2022    4:23 PM 01/22/2022   10:22 AM 07/23/2021    1:59 PM 02/05/2021    8:02 AM  PHQ 2/9 Scores  PHQ - 2 Score 0 0 0 1 2 0 0  PHQ- 9 Score 2 1 1  3 1      Fall Risk    10/21/2022    1:18 PM 10/20/2022    2:48 PM 09/28/2022    2:01 PM 09/04/2022    8:44 AM 03/11/2022    4:23 PM  Fall Risk   Falls in the past year? 0 0 0 0 0  Number falls in past yr: 0 0 0 0 0  Injury with Fall? 0 0 0 0 0  Risk for fall due to :   No Fall Risks No Fall Risks No Fall Risks  Follow up Falls evaluation completed;Education provided;Falls prevention discussed  Falls evaluation completed Falls evaluation completed Falls evaluation completed    FALL RISK PREVENTION PERTAINING TO THE HOME:  Any stairs in or around the home? No  If so, are there any without handrails? No  Home free of loose throw rugs in walkways, pet beds, electrical cords, etc? Yes  Adequate lighting in your home to reduce risk of falls? Yes   ASSISTIVE DEVICES UTILIZED TO PREVENT FALLS:  Life alert? No  Use of a cane, walker or w/c? No  Grab bars in the bathroom? No  Shower chair or bench in shower? No  Elevated toilet seat or a handicapped toilet? No   TIMED UP AND GO:  Was the test performed? No .    Cognitive Function:    12/03/2016   11:42 AM  MMSE - Mini Mental State Exam   Orientation to time 5  Orientation to Place 5  Registration 3  Attention/ Calculation 5  Recall 3  Language- name 2 objects 2  Language- repeat 1  Language- follow 3 step command 3  Language- read & follow direction 1  Write a sentence 1  Copy design 1  Total score 30      10/21/2022    9:19 AM 10/15/2021   10:19 AM 06/27/2020    3:00 PM  Montreal Cognitive Assessment   Visuospatial/ Executive (0/5) 5 5 5   Naming (0/3) 3 3 3   Attention: Read list of digits (0/2) 2 2 2   Attention: Read list of letters (0/1) 1 1 1   Attention: Serial 7 subtraction starting at 100 (0/3) 1 3 3   Language: Repeat phrase (0/2) 2 2 2   Language : Fluency (0/1) 1 1 1   Abstraction (0/2) 2 2 2   Delayed Recall (0/5) 5 5 4   Orientation (0/6) 6 6 6   Total 28 30 29   Adjusted Score (based on education)  30       10/21/2022    1:07 PM 07/23/2021    2:09 PM 07/13/2019    8:14 AM 07/08/2018    8:57 AM 05/17/2017   10:39 AM  6CIT Screen  What Year? 0 points 0 points 0 points 0 points 0 points  What month? 0 points 0 points 0 points 0 points 0 points  What time? 0 points 0 points 0 points 0 points 0 points  Count back from 20 0 points 0 points 0 points 0 points 0 points  Months in reverse 0 points 0 points 0 points 0 points 0 points  Repeat phrase 0 points 0 points 0 points 0 points 2 points  Total Score 0 points 0 points 0 points 0 points 2 points    Immunizations Immunization History  Administered Date(s) Administered   DTaP 01/15/2011   Fluad Quad(high Dose 65+) 04/20/2019, 04/25/2020, 08/07/2020, 07/23/2021, 07/27/2022   Influenza Whole 05/22/2011, 04/12/2012   Influenza, High Dose Seasonal PF 05/03/2018   Influenza,inj,Quad PF,6+ Mos 03/28/2015, 05/14/2016, 05/17/2017   PFIZER(Purple Top)SARS-COV-2 Vaccination 09/03/2019, 09/27/2019, 04/27/2020, 01/23/2021   Pneumococcal Conjugate-13 03/28/2015   Pneumococcal Polysaccharide-23 01/12/2014   Td 05/14/2016   Zoster, Live 01/29/2014    TDAP  status: Up to date  Flu Vaccine status: Up to date  Pneumococcal vaccine status: Up to date  Covid-19 vaccine status: Information provided on how to obtain vaccines.   Qualifies for Shingles Vaccine? Yes   Zostavax completed Yes   Shingrix Completed?: No.    Education has been provided regarding the importance of this vaccine. Patient has been advised to call insurance company to determine out of pocket expense if they have not yet received this vaccine. Advised may also receive vaccine at local pharmacy or Health Dept. Verbalized acceptance and understanding.  Screening Tests Health Maintenance  Topic Date Due  Zoster Vaccines- Shingrix (1 of 2) 10/26/2022 (Originally 04/11/1965)   INFLUENZA VACCINE  02/11/2023   Medicare Annual Wellness (AWV)  10/21/2023   DTaP/Tdap/Td (3 - Tdap) 05/14/2026   COLONOSCOPY (Pts 45-62yrs Insurance coverage will need to be confirmed)  08/24/2028   Pneumonia Vaccine 47+ Years old  Completed   Hepatitis C Screening  Completed   HPV VACCINES  Aged Out   COVID-19 Vaccine  Discontinued    Health Maintenance  There are no preventive care reminders to display for this patient.   Colorectal cancer screening: No longer required.   Lung Cancer Screening: (Low Dose CT Chest recommended if Age 67-80 years, 30 pack-year currently smoking OR have quit w/in 15years.) does not qualify.   Lung Cancer Screening Referral:   Additional Screening:  Hepatitis C Screening: does not qualify; Completed 2015  Vision Screening: Recommended annual ophthalmology exams for early detection of glaucoma and other disorders of the eye. Is the patient up to date with their annual eye exam?  no Who is the provider or what is the name of the office in which the patient attends annual eye exams? [facility].   He will call to schedule If pt is not established with a provider, would they like to be referred to a provider to establish care? No .   Dental Screening: Recommended  annual dental exams for proper oral hygiene  Community Resource Referral / Chronic Care Management: CRR required this visit?  No   CCM required this visit?  No      Plan:     I have personally reviewed and noted the following in the patient's chart:   Medical and social history Use of alcohol, tobacco or illicit drugs  Current medications and supplements including opioid prescriptions. Patient is not currently taking opioid prescriptions. Functional ability and status Nutritional status Physical activity Advanced directives List of other physicians Hospitalizations, surgeries, and ER visits in previous 12 months Vitals Screenings to include cognitive, depression, and falls Referrals and appointments  In addition, I have reviewed and discussed with patient certain preventive protocols, quality metrics, and best practice recommendations. A written personalized care plan for preventive services as well as general preventive health recommendations were provided to patient.     Remi Haggard, LPN   [id number]   Nurse Notes:

## 2022-10-21 NOTE — Telephone Encounter (Signed)
Neuropsychology referral sent to [facility] (fax # [phone], phone # [phone]) 

## 2022-10-21 NOTE — Patient Instructions (Signed)
Check MRI brain  Referral for neuro psych  Continue Lamictal  Referral for sleep consult Follow up in 6 months

## 2022-10-21 NOTE — Progress Notes (Signed)
Patient: Anthony Juarez Date of Birth: 21-Oct-1945  Reason for Visit: Follow up for seizures, mild cognitive impairment History from: Patient Primary Neurologist: Dr. Terrace Juarez   ASSESSMENT AND PLAN 77 y.o. year old male  1.  Epilepsy -No recurrent seizures, continue Lamictal XR 200 mg daily  2.  Right frontal cavernous angioma, supratentorium small vessel disease 3.  Mild cognitive impairment -Feels some progression, MoCA 28/30 (30/30) -Check MRI of the brain with and without contrast to ensure stability of cavernous angioma, also due to memory change -Referral to neuropsychological evaluation given memory concerns -May also consider ATN profile testing, he wants to think about it, maternal history of Alzheimer's disease -May consider Namenda -Encouraged to continue activity  4.  History of central sleep apnea -Referral for sleep consult, may need repeat sleep testing, complaining of snoring, nocturia, as well as above memory complaints  HISTORY  Anthony Juarez is a 77 yo WM, following up for seizure disorder   He has PMHx of seizure since infant, he was treated with dilantin and phenobarbital for a long time, was eventurally tapered off after seizure free since age 84, he had one recurrent seizure at age 43, was put back on dilantin 400mg  qhs, has been on it since.   He had Bachelor's degree, retired as Building services engineer, Animal nutritionist  in 2009, moved to [place] about 2.5 years ago, now he stays at home most of time, rarely initiated activities, felt fatigue, difficulty concontrating, getting worse since summer of 2013.  His wife complains that he is not up to his house projects anymore, frequent nocturia, which has prompted recent treatment with desmopression, which helped his frequent night time awaking,  He also has sleep apnea, but could not tolerate his BiPAP machine.He is seen by Anthony Juarez.   Dilantin level was 32 while he was taking Dilantin 100 mg 4 tablets each  night, level decreased to 22 while he was taking Dilantin 100 mg 3 tablets each night, he has developed gingival hypertrophy, his brain fogginess has much improved with decreased dose of Dilantin, especially after he stopped taking Desopressin,    MRI scan of the brain showed a cavernous angioma with remote age hemorrhage in the right frontal subcortical region with  and adjacent  venous angioma.  There are moderate changes of chronic microvascular ischemia and mild degree of generalized cerebral atrophy.   Followup visit today patient has been switched to Keppra XR 750mg  daily. He is off Dilantin totally and his brain fogginess has improved. He has not had further seizure activity.   UPDATE Jan 19th 2015: He is now taking keppra xr 750mg  qhs, last seizure 1992, nocturnal seizure,  he only has mild fatigue, no longer having brain foggy sensation We have reviewed MRI taking January 2015, continue the right inferior frontal cavernous venous angioma, mild atrophy, moderate small vessel disease,   UPDATE Jan 19th 2016; I have reviewed MRI brain (with and without): right frontal juxtacortical T2 heterogenous lesion (1.4x1.0cm), with "popcorn" appearance, consistent with cavernous malformation. There is an associated small developmental venous anomaly.   Multiple round and ovoid, periventricular, subcortical and juxtacortical T2 hyperintense foci. These findings are non-specific and considerations include autoimmune, inflammatory, post-infectious, microvascular ischemic or migraine associated etiologies.     He is taking Keppra XR 750 mg every night, there was no recurrent seizure   UPDATE Aug 01 2015: He had sleep study recently,  No need for CPAP machine, he is taking keppra xr 750mg  qhs, no recurrent  seizure.    We have reviewed MRI of the brain with and without contrast in January 2017: 1.   A focus of heterogenous signal in the superficial right frontal lobe with a hemosiderin rim and adjacent  venous anomaly. This has characteristics of a cavernous malformation.   Compared to the 07/20/2013 MRI, there has been no definite change. 2.   Scattered T2/FLAIR hyperintense foci, predominantly in the subcortical deep white matter of both hemispheres. This is stable compared to the prior MRI. She is a nonspecific finding and potential etiology could be chronic microvascular ischemic change, migraine, chronic demyelination.    UPDATE Jun 21 2018: He is overall doing well, taking Keppra 750 mg tablets half tablets twice a day, does complain some moodiness, discussed with patient we decided to switch him to lamotrigine 200 mg every day   UPDATE Jun 27 2020: He is overall doing very well, has no recurrent seizure, tolerating lamotrigine ER 200 mg well, complains of difficulty sleeping occasionally, especially after using bathroom at nighttime, he hiked with his wife regularly,   Recent couple years, noticed mild memory loss, word finding difficulties, MoCA examination 29/30 today,   We again personally reviewed MRI of brain with without contrast in 2017: Focus of heterogeneous signal at the superficial right frontal lobe, with hemosiderin rim and adjacent venous anomaly, characteristic of a cavernous malformation,    Update October 15, 2021 SS: Here alone, trouble with short term memory names, places. Does well driving, medications, keeps calendar. Lives with wife. Active with hiking. No seizures. Remains on Lamictal ER 200 mg at 5 PM. Has BPH, nocturia. Has macular degeneration, lower back pain on Meloxicam. MOCA 30/30.  Update October 21, 2022 SS: MOCA 28/30. Still having trouble with short term memory, frustrating to him, sometimes trouble with directions. Watch a lot of movies, sometimes can't remember the plot, or title. Word finding trouble. Still hiking 900 miles a year.feels memory trouble is progressing, his mother had Dementia. He was told central sleep apnea in the past, couldn't tolerate BIPAP.  Is not sleeping well, nocturia at least twice. He snores. No morning headache, energy level is variable, he is still active, his BIPAP was loud. ESS 2  REVIEW OF SYSTEMS: Out of a complete 14 system review of symptoms, the patient complains only of the following symptoms, and all other reviewed systems are negative.  See HPI  ALLERGIES: Allergies  Allergen Reactions   Penicillins Rash    Did it involve swelling of the face/tongue/throat, SOB, or low BP? No Did it involve sudden or severe rash/hives, skin peeling, or any reaction on the inside of your mouth or nose? Yes Did you need to seek medical attention at a hospital or doctor's office? No When did it last happen?  Decades ago. If all above answers are "NO", may proceed with cephalosporin use.     HOME MEDICATIONS: Outpatient Medications Prior to Visit  Medication Sig Dispense Refill   albuterol (PROVENTIL HFA;VENTOLIN HFA) 108 (90 Base) MCG/ACT inhaler Inhale 2 puffs into the lungs every 4 (four) hours as needed for wheezing or shortness of breath (cough, shortness of breath or wheezing.). 1 Inhaler 2   aspirin EC 81 MG tablet Take 81 mg by mouth daily.     doxazosin (CARDURA) 2 MG tablet Take 1 tablet (2 mg total) by mouth daily. 90 tablet 2   ezetimibe (ZETIA) 10 MG tablet Take 1 tablet (10 mg total) by mouth daily. 90 tablet 3   finasteride (PROSCAR) 5  MG tablet Take 1 tablet (5 mg total) by mouth daily. 90 tablet 2   fluticasone (FLONASE) 50 MCG/ACT nasal spray USE 1 SPRAY IN EACH NOSTRIL TWICE A DAY. 16 g 6   LamoTRIgine 200 MG TB24 24 hour tablet TAKE ONE TABLET AT BEDTIME 90 tablet 4   Melatonin 1 MG/ML LIQD Take by mouth.     montelukast (SINGULAIR) 10 MG tablet TAKE ONE TABLET AT BEDTIME. 90 tablet 3   Multiple Vitamins-Minerals (MULTIVITAMIN WITH MINERALS) tablet Take 1 tablet by mouth daily.     omeprazole (PRILOSEC) 20 MG capsule Take 1 capsule (20 mg total) by mouth daily. 30 capsule 0   rosuvastatin (CRESTOR) 20 MG  tablet Take 1 tablet (20 mg total) by mouth daily. 90 tablet 3   clotrimazole (LOTRIMIN) 1 % cream Apply 1 Application topically 2 (two) times daily. 60 g 0   meloxicam (MOBIC) 7.5 MG tablet Take 1 tablet (7.5 mg total) by mouth daily. 30 tablet 1   No facility-administered medications prior to visit.    PAST MEDICAL HISTORY: Past Medical History:  Diagnosis Date   Allergy    Arthritis    Asthma    childhood   AVM (arteriovenous malformation) brain 06/15/2013   Balanitis    Benign prostatic hypertrophy    Central sleep apnea    Cholecystitis with cholelithiasis    Chronic sinus bradycardia 07/09/2018   Convulsive disorder    Esophageal reflux 03/28/2015   History of nuclear stress test    Myoview 11/16: EF 60%, normal perfusion, Low Risk   Hyperlipidemia    Prostatitis    Seizures     PAST SURGICAL HISTORY: Past Surgical History:  Procedure Laterality Date   ELBOW SURGERY  11/11/2000   repair   FRACTURE SURGERY     LEFT ELBOW   TONSILLECTOMY      FAMILY HISTORY: Family History  Problem Relation Age of Onset   Heart disease Father        cabg [id number]'s   Cancer Father        Prostate   Heart disease Maternal Grandfather        Late 5050's   Dementia Mother     SOCIAL HISTORY: Social History   Socioeconomic History   Marital status: Married    Spouse name: Natasha MeadJeri   Number of children: Not on file   Years of education: Not on file   Highest education level: Bachelor's degree (e.g., BA, AB, BS)  Occupational History   Occupation: retired    Associate Professormployer: RETIRED  Tobacco Use   Smoking status: Former    Packs/day: 1.00    Years: 10.00    Additional pack years: 0.00    Total pack years: 10.00    Types: Cigarettes    Quit date: 07/13/1977    Years since quitting: 45.3   Smokeless tobacco: Never  Vaping Use   Vaping Use: Never used  Substance and Sexual Activity   Alcohol use: Yes    Alcohol/week: 2.0 standard drinks of alcohol    Types: 2 Cans of beer per week     Comment: 2 beers a day   Drug use: No   Sexual activity: Not Currently    Birth control/protection: Post-menopausal, Abstinence  Other Topics Concern   Not on file  Social History Narrative   Patient worked in Engineer, materialstheater production.   Social Determinants of Health   Financial Resource Strain: Not on file  Food Insecurity: Not on file  Transportation Needs: Not  on file  Physical Activity: Insufficiently Active (07/09/2018)   Exercise Vital Sign    Days of Exercise per Week: 4 days    Minutes of Exercise per Session: 30 min  Stress: Not on file  Social Connections: Not on file  Intimate Partner Violence: Not on file    PHYSICAL EXAM  Vitals:   10/21/22 0916  BP: 138/82  Pulse: 76  Weight: 148 lb (67.1 kg)  Height: 5\' 9"  (1.753 m)    Body mass index is 21.86 kg/m.    10/21/2022    9:19 AM 10/15/2021   10:19 AM 06/27/2020    3:00 PM  Montreal Cognitive Assessment   Visuospatial/ Executive (0/5) 5 5 5   Naming (0/3) 3 3 3   Attention: Read list of digits (0/2) 2 2 2   Attention: Read list of letters (0/1) 1 1 1   Attention: Serial 7 subtraction starting at 100 (0/3) 1 3 3   Language: Repeat phrase (0/2) 2 2 2   Language : Fluency (0/1) 1 1 1   Abstraction (0/2) 2 2 2   Delayed Recall (0/5) 5 5 4   Orientation (0/6) 6 6 6   Total 28 30 29   Adjusted Score (based on education)  30    Generalized: Well developed, in no acute distress  Neurological examination  Mentation: Alert oriented to time, place, history taking. Follows all commands speech and language fluent Cranial nerve II-XII: Pupils were equal round reactive to light. Extraocular movements were full, visual field were full on confrontational test. Facial sensation and strength were normal.  Head turning and shoulder shrug  were normal and symmetric. Motor: The motor testing reveals 5 over 5 strength of all 4 extremities. Good symmetric motor tone is noted throughout.  Sensory: Sensory testing is intact to soft touch on  all 4 extremities. No evidence of extinction is noted.  Coordination: Cerebellar testing reveals good finger-nose-finger and heel-to-shin bilaterally.  Gait and station: Gait is normal. Tandem gait is normal.  Reflexes: Deep tendon reflexes are symmetric and normal bilaterally.   DIAGNOSTIC DATA (LABS, IMAGING, TESTING) - I reviewed patient records, labs, notes, testing and imaging myself where available.  Lab Results  Component Value Date   WBC 8.5 07/22/2022   HGB 13.7 07/22/2022   HCT 42.1 07/22/2022   MCV 93.1 07/22/2022   PLT 284 07/22/2022      Component Value Date/Time   NA 135 07/22/2022 1706   NA 141 07/11/2020 0851   K 4.1 07/22/2022 1706   CL 104 07/22/2022 1706   CO2 24 07/22/2022 1706   GLUCOSE 94 07/22/2022 1706   BUN 19 07/22/2022 1706   BUN 15 07/11/2020 0851   CREATININE 0.99 07/22/2022 1706   CREATININE 1.25 05/14/2016 0900   CALCIUM 9.0 07/22/2022 1706   PROT 6.5 07/27/2022 0957   PROT 6.0 07/11/2020 0851   ALBUMIN 4.3 07/27/2022 0957   ALBUMIN 4.2 07/11/2020 0851   AST 33 07/27/2022 0957   ALT 20 07/27/2022 0957   ALKPHOS 56 07/27/2022 0957   BILITOT 0.8 07/27/2022 0957   BILITOT 0.5 07/11/2020 0851   GFRNONAA >60 07/22/2022 1706   GFRAA 90 07/11/2020 0851   Lab Results  Component Value Date   CHOL 153 07/27/2022   HDL 74.40 07/27/2022   LDLCALC 67 07/27/2022   TRIG 60.0 07/27/2022   CHOLHDL 2 07/27/2022   Lab Results  Component Value Date   HGBA1C 5.6 07/11/2020   Lab Results  Component Value Date   VITAMINB12 516 07/16/2020   Lab Results  Component Value Date   TSH 2.680 07/16/2020    Margie Ege, AGNP-C, DNP 10/21/2022, 9:44 AM [facility] [address] [place], [place] [address] [phone]

## 2022-10-22 LAB — CREATININE, SERUM
Creatinine, Ser: 1.04 mg/dL (ref 0.76–1.27)
eGFR: 75 mL/min/{1.73_m2} (ref >59)

## 2022-10-29 DIAGNOSIS — R3121 Asymptomatic microscopic hematuria: Secondary | ICD-10-CM | POA: Diagnosis not present

## 2022-10-29 DIAGNOSIS — N401 Enlarged prostate with lower urinary tract symptoms: Secondary | ICD-10-CM | POA: Diagnosis not present

## 2022-10-29 DIAGNOSIS — Z125 Encounter for screening for malignant neoplasm of prostate: Secondary | ICD-10-CM | POA: Diagnosis not present

## 2022-10-29 DIAGNOSIS — R351 Nocturia: Secondary | ICD-10-CM | POA: Diagnosis not present

## 2022-10-29 DIAGNOSIS — R350 Frequency of micturition: Secondary | ICD-10-CM | POA: Diagnosis not present

## 2022-11-16 DIAGNOSIS — R222 Localized swelling, mass and lump, trunk: Secondary | ICD-10-CM | POA: Diagnosis not present

## 2022-11-17 ENCOUNTER — Ambulatory Visit
Admission: RE | Admit: 2022-11-17 | Discharge: 2022-11-17 | Disposition: A | Discharge status: Home / Self Care | Payer: Medicare Other | Source: Ambulatory Visit | Attending: Neurology | Admitting: Neurology

## 2022-11-17 DIAGNOSIS — G3184 Mild cognitive impairment, so stated: Secondary | ICD-10-CM | POA: Diagnosis not present

## 2022-11-17 MED ORDER — GADOPICLENOL 0.5 MMOL/ML IV SOLN
7.5000 mL | Freq: Once | INTRAVENOUS | Status: AC | PRN
  Administered 2022-11-17: 7.5 mL via INTRAVENOUS

## 2022-11-20 ENCOUNTER — Encounter: Payer: Medicare Other | Admitting: Thoracic Surgery (Cardiothoracic Vascular Surgery)

## 2022-11-27 ENCOUNTER — Encounter: Payer: Medicare Other | Admitting: Thoracic Surgery (Cardiothoracic Vascular Surgery)

## 2022-11-30 DIAGNOSIS — R350 Frequency of micturition: Secondary | ICD-10-CM | POA: Diagnosis not present

## 2022-11-30 DIAGNOSIS — R3911 Hesitancy of micturition: Secondary | ICD-10-CM | POA: Diagnosis not present

## 2022-11-30 DIAGNOSIS — R3912 Poor urinary stream: Secondary | ICD-10-CM | POA: Diagnosis not present

## 2022-12-01 ENCOUNTER — Encounter: Payer: Self-pay | Admitting: Neurology

## 2022-12-01 ENCOUNTER — Ambulatory Visit (INDEPENDENT_AMBULATORY_CARE_PROVIDER_SITE_OTHER): Payer: Medicare Other | Admitting: Neurology

## 2022-12-01 VITALS — BP 136/85 | HR 58 | Ht 69.0 in | Wt 152.0 lb

## 2022-12-01 DIAGNOSIS — J683 Other acute and subacute respiratory conditions due to chemicals, gases, fumes and vapors: Secondary | ICD-10-CM | POA: Diagnosis not present

## 2022-12-01 DIAGNOSIS — Z8669 Personal history of other diseases of the nervous system and sense organs: Secondary | ICD-10-CM | POA: Diagnosis not present

## 2022-12-01 DIAGNOSIS — G40909 Epilepsy, unspecified, not intractable, without status epilepticus: Secondary | ICD-10-CM | POA: Diagnosis not present

## 2022-12-01 NOTE — Patient Instructions (Signed)
Screening for Sleep Apnea  Sleep apnea is a condition in which breathing pauses or becomes shallow during sleep. Sleep apnea screening is a test to determine if you are at risk for sleep apnea. The test includes a series of questions. It will only takes a few minutes. Your health care provider may ask you to have this test in preparation for surgery or as part of a physical exam. What are the symptoms of sleep apnea? Common symptoms of sleep apnea include: Snoring. Waking up often at night. Daytime sleepiness. Pauses in breathing. Choking or gasping during sleep. Irritability. Forgetfulness. Trouble thinking clearly. Depression. Personality changes. Most people with sleep apnea do not know that they have it. What are the advantages of sleep apnea screening? Getting screened for sleep apnea can help: Ensure your safety. It is important for your health care providers to know whether or not you have sleep apnea, especially if you are having surgery or have other long-term (chronic) health conditions. Improve your health and allow you to get a better night's rest. Restful sleep can help you: Have more energy. Lose weight. Improve high blood pressure. Improve diabetes management. Prevent stroke. Prevent car accidents. What happens during the screening? Screening usually includes being asked a list of questions about your sleep quality. Some questions you may be asked include: Do you snore? Is your sleep restless? Do you have daytime sleepiness? Has a partner or spouse told you that you stop breathing during sleep? Have you had trouble concentrating or memory loss? What is your age? What is your neck circumference? To measure your neck, keep your back straight and gently wrap the tape measure around your neck. Put the tape measure at the middle of your neck, between your chin and collarbone. What is your sex assigned at birth? Do you have or are you being treated for high blood  pressure? If your screening test is positive, you are at risk for the condition. Further testing may be needed to confirm a diagnosis of sleep apnea. Where to find more information You can find screening tools online or at your health care clinic. For more information about sleep apnea screening and healthy sleep, visit these websites: Centers for Disease Control and Prevention: www.cdc.gov American Sleep Apnea Association: www.sleepapnea.org Contact a health care provider if: You think that you may have sleep apnea. Summary Sleep apnea screening can help determine if you are at risk for sleep apnea. It is important for your health care providers to know whether or not you have sleep apnea, especially if you are having surgery or have other chronic health conditions. You may be asked to take a screening test for sleep apnea in preparation for surgery or as part of a physical exam. This information is not intended to replace advice given to you by your health care provider. Make sure you discuss any questions you have with your health care provider. Document Revised: 06/07/2020 Document Reviewed: 06/07/2020 Elsevier Patient Education  2023 [employer]  

## 2022-12-01 NOTE — Progress Notes (Addendum)
SLEEP MEDICINE CLINIC    Provider:  Melvyn Novas, MD  Primary Care Physician:  Shade Flood, MD [address] [place] [address]     Referring Provider: Terrace Arabia, Md PhD , primary neurologist.          Chief Complaint according to patient   Patient presents with:     New Patient (Initial Visit)     Primary Neurologist Dr Terrace Arabia, followed by Maralyn Sago slack,NP       HISTORY OF PRESENT ILLNESS:  Anthony Juarez is a 77 y.o. male patient who is seen upon referral on 12/01/2022 from Dr Terrace Arabia Margie Ege, NP, for a Sleep consultation.  Pt is well, reports he tried bipap about 15-20 years ago under dr Marcelyn Bruins, MD.  He could n't get to like it, felt poorly. He takes melatonin to help him sleep. Generally feels refreshed in the morning. Takes daily naps during the day. Has some snoring, wife use to mention apneas.   Chief concern according to patient : "Could I still have apnea. "   I have the pleasure of seeing Anthony Juarez 12/01/22 a right-handed male , slender and active, who snores and may have a possible sleep disorder.  He has a seizure disorder and noctural seizure, in the first 2 hours of sleep. Used to be on dilantin, last one in 1992, after a night of staying up, alcohol . Now on Lamictal.  Takes now liquid melatonin.    The patient had the first sleep study in the year 20 13.  The patient slept 360 minutes so 6 hours total had 32 interruptions, he did not go into deep sleep stage and 3.  He had about 50 minutes of REM sleep.  Sleep was very fragmented.  He actually did much better during REM sleep at the sleep.  Exceeded in length what he could achieve in non-REM sleep.  The apnea hypopnea index was mild to moderate 15.8, central apneas 14/h of these obstructive sleep apneas 1.9/h.  Minimal desaturation most arousals were spontaneous and the heart rate varied greatly between a minimum of 34 beats a minute and a maximum of 146 bpm.    Sleep relevant medical  history: nocturnal epilepsy , Nocturia  every 2.5 hours,  at age 69 Tonsillectomy,  DDD, lumbar spine.    Family medical /sleep history: No other family member on CPAP with OSA, no siblings, parents snored. Mother passed at 22, dementia last onset. Father died of HIV, tinted blood-  no DM, HTN. But CAD.    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  Tobacco use: total of 10 years, ppd- quit in 1(78. ETOH use :  2 a week,  never been abuser. Caffeine intake in form of Coffee( 2 / AM ) Soda( /) Tea ( lunchtime 1 glass ) or energy drinks Exercise in form of hiking.     Sleep habits are as follows: The patient's dinner time is between 6-7 PM. The patient goes to bed at 11.30 PM and continues to sleep for 2.5 hour interval , wakes for 1-3 bathroom breaks, the first time at 1-2 AM.   The preferred sleep position is right sided, with the support of 1-2 pillows.  Dreams are reportedly frequent.   The patient wakes up spontaneously at 7 AM , 7.30  AM is the usual rise  time. He reports not feeling refreshed or restored in AM, with symptoms such as dry mouth, morning headaches, and residual fatigue.  Naps are taken  4-5 times a week- 3 PM to 4 PM.   Review of Systems: Out of a complete 14 system review, the patient complains of only the following symptoms, and all other reviewed systems are negative.:  Fatigue, sleepiness , snoring, fragmented sleep, Insomnia, Nocturia    How likely are you to doze in the following situations: 0 = not likely, 1 = slight chance, 2 = moderate chance, 3 = high chance   Sitting and Reading? Watching Television? Sitting inactive in a public place (theater or meeting)? As a passenger in a car for an hour without a break? Lying down in the afternoon when circumstances permit? Sitting and talking to someone? Sitting quietly after lunch without alcohol? In a car,  while stopped for a few minutes in traffic?   The Epworth Sleepiness Scale was endorsed at 3 out of 24 points fatigue severity scale of 27 out of 63 points and the depression score was endorsed at 4 out of 15 points.   Social History   Socioeconomic History   Marital status: Married    Spouse name: Natasha Mead   Number of children: Not on file   Years of education: Not on file   Highest education level: Bachelor's degree (e.g., BA, AB, BS)  Occupational History   Occupation: retired    Associate Professor: RETIRED  Tobacco Use   Smoking status: Former    Packs/day: 1.00    Years: 10.00    Additional pack years: 0.00    Total pack years: 10.00    Types: Cigarettes    Quit date: 07/13/1977    Years since quitting: 45.4   Smokeless tobacco: Never  Vaping Use   Vaping Use: Never used  Substance and Sexual Activity   Alcohol use: Yes    Alcohol/week: 2.0 standard drinks of alcohol    Types: 2 Cans of beer per week    Comment: 2 beers a day   Drug use: No   Sexual activity: Not Currently    Birth control/protection: Post-menopausal, Abstinence  Other Topics Concern   Not on file  Social History Narrative   Patient worked in Engineer, materials.   Social Determinants of Health   Financial Resource Strain: Low Risk  (10/21/2022)   Overall Financial Resource Strain (CARDIA)    Difficulty of Paying Living Expenses: Not hard at all  Food Insecurity: No Food Insecurity (10/21/2022)   Hunger Vital Sign    Worried About Running Out of Food in the Last Year: Never true    Ran Out of Food in the Last Year: Never true  Transportation Needs: No Transportation Needs (10/21/2022)   PRAPARE - Administrator, Civil Service (Medical): No    Lack of Transportation (Non-Medical): No  Physical Activity: Sufficiently Active (10/21/2022)   Exercise Vital Sign    Days of Exercise per Week: 5 days    Minutes of Exercise per Session: 60 min  Stress: No Stress Concern Present (10/21/2022)   [employer] of Occupational Health - Occupational Stress Questionnaire    Feeling of Stress : Not at all  Social Connections: Moderately Integrated (10/21/2022)   Social Connection and Isolation Panel [NHANES]    Frequency of Communication with Friends and Family: Once a week    Frequency of Social Gatherings with Friends and Family: Twice a week    Attends Religious Services:  Never    Active Member of Clubs or Organizations: Yes    Attends Banker Meetings: More than 4 times per year    Marital Status: Married    Family History  Problem Relation Age of Onset   Heart disease Father        cabg 42's   Cancer Father        Prostate   Heart disease Maternal Grandfather        Late 59's   Dementia Mother     Past Medical History:  Diagnosis Date   Allergy    Arthritis    Asthma    childhood   AVM (arteriovenous malformation) brain 06/15/2013   Balanitis    Benign prostatic hypertrophy    Central sleep apnea    Cholecystitis with cholelithiasis    Chronic sinus bradycardia 07/09/2018   Convulsive disorder (HCC)    Esophageal reflux 03/28/2015   History of nuclear stress test    Myoview 11/16: EF 60%, normal perfusion, Low Risk   Hyperlipidemia    Prostatitis    Seizures (HCC)     Past Surgical History:  Procedure Laterality Date   ELBOW SURGERY  11/11/2000   repair   FRACTURE SURGERY     LEFT ELBOW   TONSILLECTOMY       Current Outpatient Medications on File Prior to Visit  Medication Sig Dispense Refill   albuterol (PROVENTIL HFA;VENTOLIN HFA) 108 (90 Base) MCG/ACT inhaler Inhale 2 puffs into the lungs every 4 (four) hours as needed for wheezing or shortness of breath (cough, shortness of breath or wheezing.). 1 Inhaler 2   aspirin EC 81 MG tablet Take 81 mg by mouth daily.     doxazosin (CARDURA) 2 MG tablet Take 1 tablet (2 mg total) by mouth daily. 90 tablet 2   ezetimibe (ZETIA) 10 MG tablet Take 1 tablet (10 mg total) by mouth daily. 90 tablet 3    finasteride (PROSCAR) 5 MG tablet Take 1 tablet (5 mg total) by mouth daily. 90 tablet 2   fluticasone (FLONASE) 50 MCG/ACT nasal spray USE 1 SPRAY IN EACH NOSTRIL TWICE A DAY. 16 g 6   LamoTRIgine 200 MG TB24 24 hour tablet Take 1 tablet (200 mg total) by mouth at bedtime. 90 tablet 4   Melatonin 1 MG/ML LIQD Take by mouth.     montelukast (SINGULAIR) 10 MG tablet TAKE ONE TABLET AT BEDTIME. 90 tablet 3   Multiple Vitamins-Minerals (MULTIVITAMIN WITH MINERALS) tablet Take 1 tablet by mouth daily.     omeprazole (PRILOSEC) 20 MG capsule Take 1 capsule (20 mg total) by mouth daily. 30 capsule 0   rosuvastatin (CRESTOR) 20 MG tablet Take 1 tablet (20 mg total) by mouth daily. 90 tablet 3   tamsulosin (FLOMAX) 0.4 MG CAPS capsule Take 0.4 mg by mouth daily.     No current facility-administered medications on file prior to visit.    Allergies  Allergen Reactions   Penicillins Rash    Did it involve swelling of the face/tongue/throat, SOB, or low BP? No Did it involve sudden or severe rash/hives, skin peeling, or any reaction on the inside of your mouth or nose? Yes Did you need to seek medical attention at a hospital or doctor's office? No When did it last happen?  Decades ago. If all above answers are "NO", may proceed with cephalosporin use.      DIAGNOSTIC DATA (LABS, IMAGING, TESTING) - I reviewed patient records, labs, notes, testing and imaging  myself where available.  Lab Results  Component Value Date   WBC 8.5 07/22/2022   HGB 13.7 07/22/2022   HCT 42.1 07/22/2022   MCV 93.1 07/22/2022   PLT 284 07/22/2022      Component Value Date/Time   NA 135 07/22/2022 1706   NA 141 07/11/2020 0851   K 4.1 07/22/2022 1706   CL 104 07/22/2022 1706   CO2 24 07/22/2022 1706   GLUCOSE 94 07/22/2022 1706   BUN 19 07/22/2022 1706   BUN 15 07/11/2020 0851   CREATININE 1.04 10/21/2022 1011   CREATININE 1.25 05/14/2016 0900   CALCIUM 9.0 07/22/2022 1706   PROT 6.5 07/27/2022 0957    PROT 6.0 07/11/2020 0851   ALBUMIN 4.3 07/27/2022 0957   ALBUMIN 4.2 07/11/2020 0851   AST 33 07/27/2022 0957   ALT 20 07/27/2022 0957   ALKPHOS 56 07/27/2022 0957   BILITOT 0.8 07/27/2022 0957   BILITOT 0.5 07/11/2020 0851   GFRNONAA >60 07/22/2022 1706   GFRAA 90 07/11/2020 0851   Lab Results  Component Value Date   CHOL 153 07/27/2022   HDL 74.40 07/27/2022   LDLCALC 67 07/27/2022   TRIG 60.0 07/27/2022   CHOLHDL 2 07/27/2022   Lab Results  Component Value Date   HGBA1C 5.6 07/11/2020   Lab Results  Component Value Date   VITAMINB12 516 07/16/2020   Lab Results  Component Value Date   TSH 2.680 07/16/2020    PHYSICAL EXAM:  Today's Vitals   12/01/22 1258  BP: 136/85  Pulse: (!) 58  Weight: 152 lb (68.9 kg)  Height: 5\' 9"  (1.753 m)   Body mass index is 22.45 kg/m.   Wt Readings from Last 3 Encounters:  12/01/22 152 lb (68.9 kg)  10/21/22 148 lb (67.1 kg)  09/28/22 151 lb (68.5 kg)     Ht Readings from Last 3 Encounters:  12/01/22 5\' 9"  (1.753 m)  10/21/22 5\' 9"  (1.753 m)  09/28/22 5\' 9"  (1.753 m)      General: The patient is awake, alert and appears not in acute distress. The patient is well groomed. Head: Normocephalic, atraumatic. Neck is supple. Mallampati 2,  neck circumference:15 inches . Nasal airflow is barely patent.   Dental status:  biological  Cardiovascular:  Regular rate and cardiac rhythm by pulse,  without distended neck veins. Respiratory: Lungs are clear to auscultation.  Skin:  Without evidence of ankle edema, or rash. Trunk: The patient's posture is erect.   NEUROLOGIC EXAM: The patient is awake and alert, oriented to place and time.   Memory subjective described as intact.  Attention span & concentration ability appears normal.  Speech is fluent,  without  dysarthria, dysphonia or aphasia.  Mood and affect are appropriate.   Cranial nerves: no loss of smell or taste reported  Pupils are equal and briskly reactive to light.  Funduscopic exam deferred. .  Extraocular movements in vertical and horizontal planes were intact and without nystagmus. No Diplopia. Visual fields by finger perimetry are intact. Hearing was intact to soft voice and finger rubbing.    Facial sensation intact to fine touch.  Facial motor strength is symmetric and tongue and uvula move midline.  Neck ROM : rotation, tilt and flexion extension were normal for age and shoulder shrug was symmetrical.    Motor exam:  Symmetric bulk, tone and ROM.   Normal tone without cog wheeling, symmetric grip strength .   Sensory:  Fine touch was  normal.  Proprioception tested  in the upper extremities was normal.   Coordination: Rapid alternating movements in the fingers/hands were of normal speed.  The Finger-to-nose maneuver was intact without evidence of ataxia, dysmetria or tremor.   Gait and station: Patient could rise unassisted from a seated position, walked without assistive device.  Stance is of normal width/ base .  Toe and heel walk were deferred.  Deep tendon reflexes: in the  upper and lower extremities are symmetric and intact.  Babinski response was deferred    ASSESSMENT AND PLAN 77 year -old male  here with:     Anthony Juarez who prefers to go by his middle name Anthony Juarez is a retired 77 year old Caucasian gentleman who was tested for sleep apnea in 2013 and diagnosed with central sleep apnea.  1)  It is not clear to me what the triggers for central type sleep apnea were at the time currently his wife tells him that he is snoring but he is not excessively fatigued and he is certainly not excessively daytime sleepy however he does like to take a nap of an hour or less a day.   I reviewed the previous sleep study and I was surprised by the large variability in heart rate through the night.  Also it was clearly central apnea as his REM sleep did not show an increase in apnea this is a typical distribution for central versus obstructive  sleep apnea.  The patient is followed for nocturnal epilepsy by Dr. Terrace Arabia and nurse practitioner Margie Ege.  He is on lamotrigine and well-controlled.  He is also using a Ventolin inhaler as needed for wheezing and this is not frequently implemented.  He is on Cardura, he is on Proscar, Flonase, Singulair, Prilosec and Crestor.  None of these medications are suppressing the respiratory drive.  He is also not at all obese and he does not have the anatomical markers of a high risk for sleep apnea patient.  So I think we should have this patient return for a sleep study just to see if obstructive sleep apnea is present given that he does snore.    I also would like to know if central sleep apnea has persisted.  The patient had once tried to live with CPAP-BiPAP but did never feel that he could get more restful sleep with the device.    There is no question right now of nocturnal epilepsy there has been no event in 2 decades.  2)I will order a HST, if there is evidence of central apnea, will invite back yto in lab titration.   I plan to follow up either personally or through our NP within 3-5 months.   I would like to thank Dr Terrace Arabia, Margie Ege, NP, for allowing me to meet with and to take care of this pleasant patient.    After spending a total time of  40  minutes face to face and additional time for physical and neurologic examination, review of laboratory studies,  personal review of imaging studies, reports and results of other testing and review of referral information / records as far as provided in visit,   Electronically signed by: Melvyn Novas, MD 12/01/2022 1:13 PM  [facility] Board certified by The [employer] of Sleep Medicine and Diplomate of the [employer] of Sleep Medicine. Board certified In Neurology through the ABPN, Fellow of the [employer] of Neurology.

## 2022-12-04 ENCOUNTER — Encounter: Payer: Self-pay | Admitting: Thoracic Surgery (Cardiothoracic Vascular Surgery)

## 2022-12-04 ENCOUNTER — Institutional Professional Consult (permissible substitution) (INDEPENDENT_AMBULATORY_CARE_PROVIDER_SITE_OTHER): Payer: Medicare Other | Admitting: Thoracic Surgery (Cardiothoracic Vascular Surgery)

## 2022-12-04 VITALS — BP 147/76 | HR 61 | Resp 20 | Ht 69.0 in | Wt 149.0 lb

## 2022-12-04 DIAGNOSIS — R222 Localized swelling, mass and lump, trunk: Secondary | ICD-10-CM | POA: Insufficient documentation

## 2022-12-04 NOTE — Progress Notes (Unsigned)
[address]       [place] [address]             [phone]                    Anthony Juarez [facility] Medical Record #[id number] Date of Birth: Mar 08, 1946  Referring: Axel Filler, MD Primary Care: Shade Flood, MD Primary Cardiologist: Donato Schultz, MD  Chief Complaint:   No chief complaint on file.   History of Present Illness:    Tiodoro Gulbransen 77 y.o. male ***    Past Medical History:  Diagnosis Date   Allergy    Arthritis    Asthma    childhood   AVM (arteriovenous malformation) brain 06/15/2013   Balanitis    Benign prostatic hypertrophy    Central sleep apnea    Cholecystitis with cholelithiasis    Chronic sinus bradycardia 07/09/2018   Convulsive disorder (HCC)    Esophageal reflux 03/28/2015   History of nuclear stress test    Myoview 11/16: EF 60%, normal perfusion, Low Risk   Hyperlipidemia    Prostatitis    Seizures (HCC)     Past Surgical History:  Procedure Laterality Date   ELBOW SURGERY  11/11/2000   repair   FRACTURE SURGERY     LEFT ELBOW   TONSILLECTOMY      Family History  Problem Relation Age of Onset   Heart disease Father        cabg 70's   Cancer Father        Prostate   Heart disease Maternal Grandfather        Late 64's   Dementia Mother      Social History   Tobacco Use  Smoking Status Former   Packs/day: 1.00   Years: 10.00   Additional pack years: 0.00   Total pack years: 10.00   Types: Cigarettes   Quit date: 07/13/1977   Years since quitting: 45.4  Smokeless Tobacco Never    Social History   Substance and Sexual Activity  Alcohol Use Yes   Alcohol/week: 2.0 standard drinks of alcohol   Types: 2 Cans of beer per week   Comment: 2 beers a day     Allergies  Allergen Reactions   Penicillins Rash    Did it involve swelling of the face/tongue/throat, SOB, or low BP? No Did it involve sudden or severe rash/hives, skin peeling, or any reaction on the inside of your  mouth or nose? Yes Did you need to seek medical attention at a hospital or doctor's office? No When did it last happen?  Decades ago. If all above answers are "NO", may proceed with cephalosporin use.     Current Outpatient Medications  Medication Sig Dispense Refill   albuterol (PROVENTIL HFA;VENTOLIN HFA) 108 (90 Base) MCG/ACT inhaler Inhale 2 puffs into the lungs every 4 (four) hours as needed for wheezing or shortness of breath (cough, shortness of breath or wheezing.). 1 Inhaler 2   aspirin EC 81 MG tablet Take 81 mg by mouth daily.     doxazosin (CARDURA) 2 MG tablet Take 1 tablet (2 mg total) by mouth daily. 90 tablet 2   ezetimibe (ZETIA) 10 MG tablet Take 1 tablet (10 mg total) by mouth daily. 90 tablet 3   finasteride (PROSCAR) 5 MG tablet Take 1 tablet (5 mg total) by mouth daily. 90 tablet 2   fluticasone (FLONASE) 50 MCG/ACT nasal spray USE 1 SPRAY  IN EACH NOSTRIL TWICE A DAY. 16 g 6   LamoTRIgine 200 MG TB24 24 hour tablet Take 1 tablet (200 mg total) by mouth at bedtime. 90 tablet 4   Melatonin 1 MG/ML LIQD Take by mouth.     montelukast (SINGULAIR) 10 MG tablet TAKE ONE TABLET AT BEDTIME. 90 tablet 3   Multiple Vitamins-Minerals (MULTIVITAMIN WITH MINERALS) tablet Take 1 tablet by mouth daily.     omeprazole (PRILOSEC) 20 MG capsule Take 1 capsule (20 mg total) by mouth daily. 30 capsule 0   rosuvastatin (CRESTOR) 20 MG tablet Take 1 tablet (20 mg total) by mouth daily. 90 tablet 3   tamsulosin (FLOMAX) 0.4 MG CAPS capsule Take 0.4 mg by mouth daily.     No current facility-administered medications for this visit.    ROS  PHYSICAL EXAMINATION: There were no vitals taken for this visit.  Physical Exam   Diagnostic Studies & Laboratory data:     Recent Radiology Findings:   MR BRAIN W WO CONTRAST  Result Date: 11/17/2022 Table formatting from the original result was not included. [facility] NEUROIMAGING REPORT STUDY DATE: 11/17/22 PATIENT NAME:  Anthony Juarez DOB: 12-15-1945 MRN: [id number] ORDERING CLINICIAN: Glean Salvo, NP CLINICAL HISTORY: 77 y.o. year old male with: 1. Mild cognitive impairment   EXAM: MR BRAIN W WO CONTRAST TECHNIQUE: MRI of the brain with and without contrast was obtained utilizing multiplanar, multiecho pulse sequences. CONTRAST: Diagnostic Product Medications (last 72 hours)   Date/Time Action Medication Dose  11/17/22 1109 Contrast Given  gadopiclenol (VUEWAY) 0.5 MMOL/ML solution 7.5 mL 7.5 mL   COMPARISON: 07/29/15 MRI IMAGING SITE: [facility] AT [address] [place] [address] FINDINGS: No abnormal lesions are seen on diffusion-weighted views to suggest acute ischemia. The cortical sulci, fissures and cisterns are normal in size and appearance. Lateral, third and fourth ventricle are normal in size and appearance. No extra-axial fluid collections are seen. No evidence of mass effect or midline shift.  Scattered periventricular, subcortical and juxtacortical foci of chronic small vessel ischemic disease.  No abnormal lesions are seen on post contrast views.  Stable right frontal opercular cavernous malformation measuring 10mm.  Associated developmental venous anomaly also stable. On sagittal views the posterior fossa, pituitary gland and corpus callosum are unremarkable. No evidence of intracranial hemorrhage on SWI views. The orbits and their contents, paranasal sinuses and calvarium are unremarkable.  Intracranial flow voids are present.   MRI brain (with and without) demonstrating: -Stable right frontal developmental venous anomaly and associated cavernous malformation. -Stable mild chronic small vessel ischemic disease. -No acute findings. INTERPRETING PHYSICIAN: Suanne Marker, MD Certified in Neurology, Neurophysiology and Neuroimaging [facility] [address], [address] [place], [place] [address] [phone]      I have  independently reviewed the above radiology studies  and reviewed the findings with the patient.   Recent Lab Findings: Lab Results  Component Value Date   WBC 8.5 07/22/2022   HGB 13.7 07/22/2022   HCT 42.1 07/22/2022   PLT 284 07/22/2022   GLUCOSE 94 07/22/2022   CHOL 153 07/27/2022   TRIG 60.0 07/27/2022   HDL 74.40 07/27/2022   LDLCALC 67 07/27/2022   ALT 20 07/27/2022   AST 33 07/27/2022   NA 135 07/22/2022   K 4.1 07/22/2022   CL 104 07/22/2022   CREATININE 1.04 10/21/2022   BUN 19 07/22/2022   CO2 24 07/22/2022   TSH 2.680  07/16/2020   HGBA1C 5.6 07/11/2020        Assessment / Plan:   77yo male with right chest wall mass.  On imaging, it appears cystic and well circumscribed.  There does not appear to be any muscular or rib involvement.  We discussed the risks and benefits of a surgical excision.  ***      [name] 12/04/2022 8:55 AM

## 2022-12-08 ENCOUNTER — Telehealth: Payer: Self-pay | Admitting: Neurology

## 2022-12-08 NOTE — Telephone Encounter (Signed)
12/08/22 LVM [place] 12/01/22 Medicare/[employer] no auth req EE

## 2022-12-16 NOTE — Telephone Encounter (Signed)
Patient called back.  HST- Medicare/[employer] no auth req   Patient is scheduled at [facility] for 01/05/23 at 8:30 AM.  Mailed packet to the patient.

## 2022-12-29 DIAGNOSIS — H43393 Other vitreous opacities, bilateral: Secondary | ICD-10-CM | POA: Diagnosis not present

## 2022-12-29 DIAGNOSIS — H2513 Age-related nuclear cataract, bilateral: Secondary | ICD-10-CM | POA: Diagnosis not present

## 2023-01-05 ENCOUNTER — Ambulatory Visit (INDEPENDENT_AMBULATORY_CARE_PROVIDER_SITE_OTHER): Payer: Medicare Other | Admitting: Neurology

## 2023-01-05 DIAGNOSIS — J683 Other acute and subacute respiratory conditions due to chemicals, gases, fumes and vapors: Secondary | ICD-10-CM

## 2023-01-05 DIAGNOSIS — R3911 Hesitancy of micturition: Secondary | ICD-10-CM | POA: Diagnosis not present

## 2023-01-05 DIAGNOSIS — G4731 Primary central sleep apnea: Secondary | ICD-10-CM

## 2023-01-05 DIAGNOSIS — G4733 Obstructive sleep apnea (adult) (pediatric): Secondary | ICD-10-CM

## 2023-01-05 DIAGNOSIS — R001 Bradycardia, unspecified: Secondary | ICD-10-CM

## 2023-01-05 DIAGNOSIS — N401 Enlarged prostate with lower urinary tract symptoms: Secondary | ICD-10-CM | POA: Diagnosis not present

## 2023-01-05 DIAGNOSIS — R351 Nocturia: Secondary | ICD-10-CM | POA: Diagnosis not present

## 2023-01-05 DIAGNOSIS — Z8669 Personal history of other diseases of the nervous system and sense organs: Secondary | ICD-10-CM

## 2023-01-05 DIAGNOSIS — Q282 Arteriovenous malformation of cerebral vessels: Secondary | ICD-10-CM

## 2023-01-05 DIAGNOSIS — G40909 Epilepsy, unspecified, not intractable, without status epilepticus: Secondary | ICD-10-CM

## 2023-01-06 NOTE — Progress Notes (Signed)
[facility]   HOME SLEEP TEST REPORT ( by Watch PAT)   STUDY DATE:  01-13-2023 Anthony Juarez "Anthony Juarez" 77 year old male 1946-06-01    ORDERING CLINICIAN: Melvyn Novas, MD  REFERRING CLINICIAN:  Dr Levert Feinstein  is Primary Neurologist.  Margie Ege, NP  and Dr Neva Seat, PCP    CLINICAL INFORMATION/HISTORY: Epilepsy,  Mass on right chest wall,  MCI per Np Slack, here for CSA re-evaluation of an existing diagnosis.History of central sleep apnea Dr Terrace Arabia : -Referral for sleep consult, may need repeat sleep testing, complaining of snoring, nocturia, as well as above memory complaints  Epworth sleepiness score: 3 /24.  FSS at : 27/ 63 points,  MOCA 28/ 30   BMI: 22 kg/m   Neck Circumference: 15 inches.    FINDINGS:   Sleep Summary:   Total Recording Time (hours, min): 7 hours 36 minutes      Total Sleep Time (hours, min):   6 hours 36 minutes              Percent REM (%): 22%    , there was a brief REM latency of 30 minutes noted.  Overall sleep latency was only 16 minutes.                                  Respiratory Indices by AASM criteria: AHI following CMS criteria are placed in ().   Calculated pAHI (per hour):   18.3/h  (11.4/h).       Central sleep apnea was seen for 24% of all apneas and this makes this apnea complex sleep apnea instead of simple obstructive sleep apnea.                    REM pAHI:    26.1/h  (21.7/h)                                             NREM pAHI: 16.1/h (8.5)                             Positional AHI: The patient slept mostly in supine position associated with an AHI of 19.3/h following AASM criteria there was approximately 85 minutes of left-sided sleep associated with an AHI of 7.5/h.    Following CMS criteria supine AHI was 13.1 and nonsupine AHI is 6.6/h.      Snoring reached a mean volume of 40 dB which is basically just at threshold.  Only 4% of nocturnal sleep time were associated with snoring, this  speaks for a more central apnea as well.                                            Oxygen Saturation Statistics:   O2 Saturation Range (%): Between a nadir at 88% and a maximum saturation of 99% with a mean saturation of 96%.  O2 Saturation (minutes) <89%:    0 minutes     There was no hypoxia present.   Pulse Rate Statistics:   Pulse Mean (bpm): 44 bpm               Pulse Range: Between 30 and 81 bpm, this reflects heart rate not cardiac rhythm.                IMPRESSION:  This HST confirms the presence of a mild to moderate obstructive sleep apnea with significant REM sleep dependency.  There is also significantly more apnea but the patient sleeps on his back. There is a high proportion of central apnea noted making this a complex sleep apnea of moderate severity.   Complex sleep apneas should not be treated with dental devices, hypoglossal nerve stimulators, and are usually related to either opioid intake, or heart failure, renal failure or hepatic failure -all diminishing the brains breathing response and reactivity.   RECOMMENDATION Positive airway pressure therapy is still the best treatment for a complex sleep apnea also it may be BiPAP rather than CPAP that works the best- in some cases ASV will be needed. I would invite this patient for an in lab titration so that the technician can advance treatment between modalities.    INTERPRETING PHYSICIAN:   Melvyn Novas, MD

## 2023-01-17 NOTE — Progress Notes (Signed)
I suspect a much higher proportion of central apnea as HSTs are notoriously undercounting  non-obstructive events. There was no snoring, no hypoxia and  persistent  bradycardia.  Moderate severe complex sleep apnea  I will invite this patient  as soon as possible for an in lab titration. PAP to start, then ST or ASV if needed.

## 2023-01-17 NOTE — Addendum Note (Signed)
Addended by: Melvyn Novas on: 01/17/2023 01:51 PM   Modules accepted: Orders

## 2023-01-17 NOTE — Procedures (Signed)
[facility]   HOME SLEEP TEST REPORT ( by Watch PAT)   STUDY DATE:  01-13-2023 Anthony Humble "Brynda Greathouse" 77 year old male 1946/05/22    ORDERING CLINICIAN: Melvyn Novas, MD  REFERRING CLINICIAN:  Dr Levert Feinstein  is Primary Neurologist.  Margie Ege, NP  and Dr Neva Seat, PCP    CLINICAL INFORMATION/HISTORY: Epilepsy,  Mass on right chest wall,  MCI per Np Slack, here for CSA re-evaluation of an existing diagnosis.History of central sleep apnea Dr Terrace Arabia : -Referral for sleep consult, may need repeat sleep testing, complaining of snoring, nocturia, as well as above memory complaints  Epworth sleepiness score: 3 /24.  FSS at : 27/ 63 points,  MOCA 28/ 30   BMI: 22 kg/m   Neck Circumference: 15 inches.    FINDINGS:   Sleep Summary:   Total Recording Time (hours, min): 7 hours 36 minutes      Total Sleep Time (hours, min):   6 hours 36 minutes              Percent REM (%): 22%    , there was a brief REM latency of 30 minutes noted.  Overall sleep latency was only 16 minutes.                                  Respiratory Indices by AASM criteria: AHI following CMS criteria are placed in ().   Calculated pAHI (per hour):   18.3/h  (11.4/h).       Central sleep apnea was seen for 24% of all apneas and this makes this apnea complex sleep apnea instead of simple obstructive sleep apnea.                    REM pAHI:    26.1/h  (21.7/h)                                             NREM pAHI: 16.1/h (8.5)                             Positional AHI: The patient slept mostly in supine position associated with an AHI of 19.3/h following AASM criteria there was approximately 85 minutes of left-sided sleep associated with an AHI of 7.5/h.    Following CMS criteria supine AHI was 13.1 and nonsupine AHI is 6.6/h.      Snoring reached a mean volume of 40 dB which is basically just at threshold.  Only 4% of nocturnal sleep time were associated with snoring, this speaks for  a more central apnea as well.                                            Oxygen Saturation Statistics:   O2 Saturation Range (%): Between a nadir at 88% and a maximum saturation of 99% with a mean saturation of 96%.  O2 Saturation (minutes) <89%:    0 minutes     There was no hypoxia present.   Pulse Rate Statistics:   Pulse Mean (bpm): 44 bpm               Pulse Range: Between 30 and 81 bpm, this reflects heart rate not cardiac rhythm.                IMPRESSION:  This HST confirms the presence of a mild to moderate obstructive sleep apnea with significant REM sleep dependency.  There is also significantly more apnea but the patient sleeps on his back. There is a high proportion of central apnea noted making this a complex sleep apnea of moderate severity.   Complex sleep apneas should not be treated with dental devices, hypoglossal nerve stimulators, and are usually related to either opioid intake, or heart failure, renal failure or hepatic failure -all diminishing the brains breathing response and reactivity.   RECOMMENDATION Positive airway pressure therapy is still the best treatment for a complex sleep apnea also it may be BiPAP rather than CPAP that works the best- in some cases ASV will be needed. I would invite this patient for an in lab titration so that the technician can advance treatment between modalities.    INTERPRETING PHYSICIAN:   Melvyn Novas, MD

## 2023-01-19 ENCOUNTER — Telehealth: Payer: Self-pay

## 2023-01-19 NOTE — Telephone Encounter (Signed)
Pt returned call. I advised pt that Dr. Vickey Huger reviewed their sleep study results and found that pt has mixed apnea and recommends that pt be treated with a cpap. Dr. Vickey Huger recommends that pt return for a repeat sleep study in order to properly titrate the cpap and ensure a good mask fit. Pt is agreeable to returning for a titration study. I advised pt that our sleep lab will file with pt's insurance and call pt to schedule the sleep study when we hear back from the pt's insurance regarding coverage of this sleep study. Pt verbalized understanding of results. Pt had no questions at this time but was encouraged to call back if questions arise.

## 2023-01-19 NOTE — Telephone Encounter (Signed)
I called patient to discuss. No answer, left a message asking him to call us back. If patient calls back please route to POD 3.

## 2023-01-19 NOTE — Telephone Encounter (Signed)
-----   Message from Melvyn Novas, MD sent at 01/17/2023  1:51 PM EDT ----- I suspect a much higher proportion of central apnea as HSTs are notoriously undercounting  non-obstructive events. There was no snoring, no hypoxia and  persistent  bradycardia.  Moderate severe complex sleep apnea  I will invite this patient  as soon as possible for an in lab titration. PAP to start, then ST or ASV if needed.

## 2023-01-25 ENCOUNTER — Ambulatory Visit: Payer: Medicare Other | Admitting: Family Medicine

## 2023-01-26 ENCOUNTER — Telehealth: Payer: Self-pay | Admitting: Neurology

## 2023-01-26 NOTE — Telephone Encounter (Signed)
CPAP- Medicare/generic no auth req.  Sent mychart message to the patient.

## 2023-01-28 ENCOUNTER — Ambulatory Visit (INDEPENDENT_AMBULATORY_CARE_PROVIDER_SITE_OTHER): Payer: Medicare Other | Admitting: Family Medicine

## 2023-01-28 VITALS — BP 128/74 | HR 63 | Temp 98.1°F | Ht 69.0 in | Wt 155.8 lb

## 2023-01-28 DIAGNOSIS — N401 Enlarged prostate with lower urinary tract symptoms: Secondary | ICD-10-CM

## 2023-01-28 DIAGNOSIS — G4731 Primary central sleep apnea: Secondary | ICD-10-CM

## 2023-01-28 DIAGNOSIS — R0609 Other forms of dyspnea: Secondary | ICD-10-CM | POA: Diagnosis not present

## 2023-01-28 DIAGNOSIS — M722 Plantar fascial fibromatosis: Secondary | ICD-10-CM | POA: Diagnosis not present

## 2023-01-28 DIAGNOSIS — R3914 Feeling of incomplete bladder emptying: Secondary | ICD-10-CM

## 2023-01-28 DIAGNOSIS — M79672 Pain in left foot: Secondary | ICD-10-CM

## 2023-01-28 DIAGNOSIS — E782 Mixed hyperlipidemia: Secondary | ICD-10-CM

## 2023-01-28 MED ORDER — FINASTERIDE 5 MG PO TABS
5.0000 mg | ORAL_TABLET | Freq: Every day | ORAL | 2 refills | Status: DC
Start: 2023-01-28 — End: 2024-03-20

## 2023-01-28 MED ORDER — DOXAZOSIN MESYLATE 2 MG PO TABS
2.0000 mg | ORAL_TABLET | Freq: Every day | ORAL | 2 refills | Status: DC
Start: 2023-01-28 — End: 2024-03-15

## 2023-01-28 NOTE — Progress Notes (Unsigned)
Subjective:  Patient ID: Anthony Juarez, male    DOB: 27-Dec-1945  Age: 77 y.o. MRN: [id number]  CC:  Chief Complaint  Patient presents with   Medical Management of Chronic Issues    Pt doing well   Plantar Fasciitis    Pt notes some foot issues for about 10 weeks now, notes has been trying to do stretches, icing, massage to try and treat at home but is looking for some more direction for how to care for this     HPI [name] presents for   Plantar fasciitis: As above, left side, experiencing symptoms past 10 weeks or more, has tried stretching, icing, massage - some relief. Transient during the day. Walking is fine, painful to stand on hard surfaces, long hike sometimes sore. No injury/pop. Some pain behind heel and in front at times. Overall better. Using silicone heel cup - some relief. No recent change in hiking miles.   Hyperlipidemia: With elevated coronary calcium score, family history of early CAD, treated with Crestor 20 mg daily, Zetia 10 mg daily. History of sleep apnea, unable to tolerate BiPAP in past. Has met with sleep specialist recently, after meeting with neurology, discussion of mild cognitive impairment.  Full sleep study in next few months to decide on trial of Bipap again. AHI 18.3 on 6/25, 24% central.  Some slight increased dyspnea going up steep hill - past few months, no chest pain.  He was seen by cardiology in January.  Echo February 19 overall reassuring.  Mild mitral regurgitation but normal function.  Cardiac PET stress test January 30 low risk with no evidence of ischemia.  Coronary calcifications were multivessel. Lab Results  Component Value Date   CHOL 153 07/27/2022   HDL 74.40 07/27/2022   LDLCALC 67 07/27/2022   TRIG 60.0 07/27/2022   CHOLHDL 2 07/27/2022   Lab Results  Component Value Date   ALT 20 07/27/2022   AST 33 07/27/2022   ALKPHOS 56 07/27/2022   BILITOT 0.8 07/27/2022   Benign prostate hypertrophy Treated with  finasteride, doxazosin.  Denies retention symptoms.  Seen by urology in May.  Dr. Alvester Morin. No changes for now. Stable for now - nocturia 2 per night, not interested in other treatments for now.   Asthma: Treated with Singulair 10 mg ED, albuterol as needed in the past. No recent need. Stable.   GERD Omeprazole 20 mg daily. Controlled.   History Patient Active Problem List   Diagnosis Date Noted   Chest wall mass 12/04/2022   History of sleep apnea 12/01/2022   Nocturnal epilepsy (HCC) 12/01/2022   Reactive airways dysfunction syndrome (HCC) 12/01/2022   Family history of early CAD 05/05/2021   Mild cognitive impairment 06/27/2020   Chronic sinus bradycardia 07/09/2018   Abnormal bowel movement 07/09/2018   Hyperinflation of lungs 07/09/2018   Non-seasonal allergic rhinitis 07/09/2018   Abnormal finding on MRI of brain 12/04/2016   Benign prostatic hyperplasia with incomplete bladder emptying 11/11/2016   Mixed hyperlipidemia 05/13/2016   Esophageal reflux 03/28/2015   Shortness of breath 03/28/2015   Skin lesion of face 03/28/2015   AVM (arteriovenous malformation) brain 06/15/2013   Seizure disorder (HCC) 01/19/2013   Central sleep apnea 06/10/2012   Epigastric pain 06/06/2012   Gallstones 06/06/2012   Nausea 06/06/2012   Past Medical History:  Diagnosis Date   Allergy    Arthritis    Asthma    childhood   AVM (arteriovenous malformation) brain 06/15/2013   Balanitis  Benign prostatic hypertrophy    Central sleep apnea    Cholecystitis with cholelithiasis    Chronic sinus bradycardia 07/09/2018   Convulsive disorder (HCC)    Esophageal reflux 03/28/2015   History of nuclear stress test    Myoview 11/16: EF 60%, normal perfusion, Low Risk   Hyperlipidemia    Prostatitis    Seizures (HCC)    Past Surgical History:  Procedure Laterality Date   ELBOW SURGERY  11/11/2000   repair   FRACTURE SURGERY     LEFT ELBOW   TONSILLECTOMY     Allergies  Allergen  Reactions   Penicillins Rash    Did it involve swelling of the face/tongue/throat, SOB, or low BP? No Did it involve sudden or severe rash/hives, skin peeling, or any reaction on the inside of your mouth or nose? Yes Did you need to seek medical attention at a hospital or doctor's office? No When did it last happen?  Decades ago. If all above answers are "NO", may proceed with cephalosporin use.    Prior to Admission medications   Medication Sig Start Date End Date Taking? Authorizing Provider  aspirin EC 81 MG tablet Take 81 mg by mouth daily.   Yes [provider]  Creatine Monohydrate POWD Take 5 g by mouth daily. 01/12/23  Yes [provider]  doxazosin (CARDURA) 2 MG tablet Take 1 tablet (2 mg total) by mouth daily. 07/27/22  Yes Shade Flood, MD  ezetimibe (ZETIA) 10 MG tablet Take 1 tablet (10 mg total) by mouth daily. 08/04/22  Yes Jake Bathe, MD  finasteride (PROSCAR) 5 MG tablet Take 1 tablet (5 mg total) by mouth daily. 07/27/22  Yes Shade Flood, MD  fluticasone [facility]) 50 MCG/ACT nasal spray USE 1 SPRAY IN EACH NOSTRIL TWICE A DAY. 03/17/22  Yes Shade Flood, MD  LamoTRIgine 200 MG TB24 24 hour tablet Take 1 tablet (200 mg total) by mouth at bedtime. 10/21/22  Yes Glean Salvo, NP  Melatonin 1 MG/ML LIQD Take 2 mg by mouth.   Yes [provider]  montelukast (SINGULAIR) 10 MG tablet TAKE ONE TABLET AT BEDTIME. 07/02/22  Yes Shade Flood, MD  Multiple Vitamins-Minerals (MULTIVITAMIN WITH MINERALS) tablet Take 1 tablet by mouth daily.   Yes [provider]  omeprazole (PRILOSEC) 20 MG capsule Take 1 capsule (20 mg total) by mouth daily. 01/22/22  Yes Shade Flood, MD  rosuvastatin (CRESTOR) 20 MG tablet Take 1 tablet (20 mg total) by mouth daily. 08/04/22  Yes Jake Bathe, MD  tamsulosin (FLOMAX) 0.4 MG CAPS capsule Take 0.4 mg by mouth daily. 11/28/22  Yes [provider]  albuterol (PROVENTIL HFA;VENTOLIN HFA)  108 (90 Base) MCG/ACT inhaler Inhale 2 puffs into the lungs every 4 (four) hours as needed for wheezing or shortness of breath (cough, shortness of breath or wheezing.). Patient not taking: Reported on 01/28/2023 06/01/17   Sherren Mocha, MD   Social History   Socioeconomic History   Marital status: Married    Spouse name: Natasha Mead   Number of children: Not on file   Years of education: Not on file   Highest education level: Bachelor's degree (e.g., BA, AB, BS)  Occupational History   Occupation: retired    Associate Professor: RETIRED  Tobacco Use   Smoking status: Former    Current packs/day: 0.00    Average packs/day: 1 pack/day for 10.0 years (10.0 ttl pk-yrs)    Types: Cigarettes  Start date: 07/14/1967    Quit date: 07/13/1977    Years since quitting: 45.5   Smokeless tobacco: Never  Vaping Use   Vaping status: Never Used  Substance and Sexual Activity   Alcohol use: Yes    Alcohol/week: 2.0 standard drinks of alcohol    Types: 2 Cans of beer per week    Comment: 2 beers a day   Drug use: No   Sexual activity: Not Currently    Birth control/protection: Post-menopausal, Abstinence  Other Topics Concern   Not on file  Social History Narrative   Patient worked in Engineer, materials.   Social Determinants of Health   Financial Resource Strain: Low Risk  (10/21/2022)   Overall Financial Resource Strain (CARDIA)    Difficulty of Paying Living Expenses: Not hard at all  Food Insecurity: No Food Insecurity (10/21/2022)   Hunger Vital Sign    Worried About Running Out of Food in the Last Year: Never true    Ran Out of Food in the Last Year: Never true  Transportation Needs: No Transportation Needs (10/21/2022)   PRAPARE - Administrator, Civil Service (Medical): No    Lack of Transportation (Non-Medical): No  Physical Activity: Sufficiently Active (10/21/2022)   Exercise Vital Sign    Days of Exercise per Week: 5 days    Minutes of Exercise per Session: 60 min  Stress: No  Stress Concern Present (10/21/2022)   [employer] of Occupational Health - Occupational Stress Questionnaire    Feeling of Stress : Not at all  Social Connections: Moderately Integrated (10/21/2022)   Social Connection and Isolation Panel [NHANES]    Frequency of Communication with Friends and Family: Once a week    Frequency of Social Gatherings with Friends and Family: Twice a week    Attends Religious Services: Never    Database administrator or Organizations: Yes    Attends Engineer, structural: More than 4 times per year    Marital Status: Married  Catering manager Violence: Not At Risk (10/21/2022)   Humiliation, Afraid, Rape, and Kick questionnaire    Fear of Current or Ex-Partner: No    Emotionally Abused: No    Physically Abused: No    Sexually Abused: No    Review of Systems  Constitutional:  Negative for fatigue and unexpected weight change.  Eyes:  Negative for visual disturbance.  Respiratory:  Positive for shortness of breath (As above). Negative for cough and chest tightness.   Cardiovascular:  Negative for chest pain, palpitations and leg swelling.  Gastrointestinal:  Negative for abdominal pain and blood in stool.  Neurological:  Negative for dizziness, light-headedness and headaches.     Objective:   Vitals:   01/28/23 0848  BP: 128/74  Pulse: 63  Temp: 98.1 F (36.7 C)  TempSrc: Temporal  SpO2: 99%  Weight: 155 lb 12.8 oz (70.7 kg)  Height: 5\' 9"  (1.753 m)     Physical Exam Vitals reviewed.  Constitutional:      Appearance: He is well-developed.  HENT:     Head: Normocephalic and atraumatic.  Neck:     Vascular: No carotid bruit or JVD.  Cardiovascular:     Rate and Rhythm: Normal rate and regular rhythm.     Heart sounds: Normal heart sounds. No murmur heard. Pulmonary:     Effort: Pulmonary effort is normal.     Breath sounds: Normal breath sounds. No rales.  Musculoskeletal:     Right lower leg:  No edema.     Left lower  leg: No edema.     Comments: Left foot, tender at origin of plantar fascia, minimal tenderness at distal plantar heel, negative heel squeeze laterally until at the most anterior aspect.  Achilles nontender, retrocalcaneal bursa nontender, no malleoli or tenderness.  Navicula, fifth metatarsal and remainder of foot were nontender.  Skin intact, no soft tissue swelling or erythema appreciated.  Skin:    General: Skin is warm and dry.  Neurological:     Mental Status: He is alert and oriented to person, place, and time.  Psychiatric:        Mood and Affect: Mood normal.    EKG: Sinus rhythm, rate 59, low voltage QRS, no acute findings, no apparent changes from a EKG on 05/05/2021, bradycardia noted at that time as well.  Assessment & Plan:  Ishan Sanroman is a 77 y.o. male . Pain of left heel Plantar fasciitis of left foot  -Overall appears consistent with plantar fasciitis.  Could have some other contributors to heel pain, denies significant change in mileage, less likely stress fracture of calcaneus and overall reassuring lateral heel squeeze.  Some improvement with over-the-counter treatments, continue viscoelastic or silicone heel cushioning for now, ice massage and stretches.  If not continue to improve recommend imaging and likely follow-up with foot specialist.  RTC precautions given.  Benign prostatic hyperplasia with incomplete bladder emptying - Plan: doxazosin (CARDURA) 2 MG tablet, finasteride (PROSCAR) 5 MG tablet  -Meds refilled, reports stable symptoms, continue same regimen with option to follow-up with urology if any new symptoms.  DOE (dyspnea on exertion) - Plan: EKG 12-Lead Mixed hyperlipidemia - Plan: Comprehensive metabolic panel, Lipid panel  -Check updated labs and adjust regimen accordingly for lipids.  Some dyspnea with exertion but denies acute changes.  I did recommend that he contact his cardiologist to discuss the symptoms and decide if further testing needed.  No  acute findings seen on in office EKG.  ER precautions given.  Central sleep apnea  -Undergoing workup with sleep specialist and likely will be restarting BiPAP.  Meds ordered this encounter  Medications   doxazosin (CARDURA) 2 MG tablet    Sig: Take 1 tablet (2 mg total) by mouth daily.    Dispense:  90 tablet    Refill:  2   finasteride (PROSCAR) 5 MG tablet    Sig: Take 1 tablet (5 mg total) by mouth daily.    Dispense:  90 tablet    Refill:  2   Patient Instructions  I recommend discussing the shortness of breath with your cardiologist to determine if other testing needed.  Prior testing in January was reassuring.  No medication changes today.  Heel pain could be a combination of heel and plantar fascia pain.  If that is improving, okay to continue using silicone heel pad, ice massage, stretches.  If not continue to improve I recommend imaging or follow-up with foot specialist to determine if other testing or treatment needed.  Keep me posted.  Thanks for coming in today.  Return to the clinic or go to the nearest emergency room if any of your symptoms worsen or new symptoms occur.      Signed,   Meredith Staggers, MD  Primary Care, [facility] [facility] 01/28/23 9:25 AM

## 2023-01-28 NOTE — Patient Instructions (Addendum)
I recommend discussing the shortness of breath with your cardiologist to determine if other testing needed.  Prior testing in January was reassuring.  No medication changes today.  Heel pain could be a combination of heel and plantar fascia pain.  If that is improving, okay to continue using silicone heel pad, ice massage, stretches.  If not continue to improve I recommend imaging or follow-up with foot specialist to determine if other testing or treatment needed.  Keep me posted.  Thanks for coming in today.  Return to the clinic or go to the nearest emergency room if any of your symptoms worsen or new symptoms occur.

## 2023-01-30 ENCOUNTER — Encounter: Payer: Self-pay | Admitting: Family Medicine

## 2023-02-24 DIAGNOSIS — L814 Other melanin hyperpigmentation: Secondary | ICD-10-CM | POA: Diagnosis not present

## 2023-02-24 DIAGNOSIS — L821 Other seborrheic keratosis: Secondary | ICD-10-CM | POA: Diagnosis not present

## 2023-02-24 DIAGNOSIS — D225 Melanocytic nevi of trunk: Secondary | ICD-10-CM | POA: Diagnosis not present

## 2023-02-24 DIAGNOSIS — L570 Actinic keratosis: Secondary | ICD-10-CM | POA: Diagnosis not present

## 2023-02-26 DIAGNOSIS — S61217A Laceration without foreign body of left little finger without damage to nail, initial encounter: Secondary | ICD-10-CM | POA: Diagnosis not present

## 2023-02-26 DIAGNOSIS — Z23 Encounter for immunization: Secondary | ICD-10-CM | POA: Diagnosis not present

## 2023-02-26 DIAGNOSIS — W260XXA Contact with knife, initial encounter: Secondary | ICD-10-CM | POA: Diagnosis not present

## 2023-03-08 DIAGNOSIS — Z4802 Encounter for removal of sutures: Secondary | ICD-10-CM | POA: Diagnosis not present

## 2023-03-18 NOTE — Progress Notes (Signed)
[address]       [place] [address]             [phone]                    [name] Medical Record #[id number] Date of Birth: Jun 16, 1946  Referring: Axel Filler, MD Primary Care: Shade Flood, MD Primary Cardiologist: Donato Schultz, MD  Chief Complaint:    Chief Complaint  Patient presents with   Follow-up    Chest wall mass, discuss biopsy    History of Present Illness:    Anthony Juarez 77 y.o. male presents for surgical evaluation of a chest wall mass.  The has been present for 2-3  years, and appears to be growing.  It does not cause him any pain.    Since his last clinic appointment there have been no significant changes.    Past Medical History:  Diagnosis Date   Allergy    Arthritis    Asthma    childhood   AVM (arteriovenous malformation) brain 06/15/2013   Balanitis    Benign prostatic hypertrophy    Central sleep apnea    Cholecystitis with cholelithiasis    Chronic sinus bradycardia 07/09/2018   Convulsive disorder (HCC)    Esophageal reflux 03/28/2015   History of nuclear stress test    Myoview 11/16: EF 60%, normal perfusion, Low Risk   Hyperlipidemia    Prostatitis    Seizures (HCC)     Past Surgical History:  Procedure Laterality Date   ELBOW SURGERY  11/11/2000   repair   FRACTURE SURGERY     LEFT ELBOW   TONSILLECTOMY      Family History  Problem Relation Age of Onset   Heart disease Father        cabg 65's   Cancer Father        Prostate   Heart disease Maternal Grandfather        Late 48's   Dementia Mother      Social History   Tobacco Use  Smoking Status Former   Current packs/day: 0.00   Average packs/day: 1 pack/day for 10.0 years (10.0 ttl pk-yrs)   Types: Cigarettes   Start date: 07/14/1967   Quit date: 07/13/1977   Years since quitting: 45.7  Smokeless Tobacco Never    Social History   Substance and Sexual Activity  Alcohol Use Yes   Alcohol/week: 2.0  standard drinks of alcohol   Types: 2 Cans of beer per week   Comment: 2 beers a day     Allergies  Allergen Reactions   Penicillins Rash    Did it involve swelling of the face/tongue/throat, SOB, or low BP? No Did it involve sudden or severe rash/hives, skin peeling, or any reaction on the inside of your mouth or nose? Yes Did you need to seek medical attention at a hospital or doctor's office? No When did it last happen?  Decades ago. If all above answers are "NO", may proceed with cephalosporin use.     Current Outpatient Medications  Medication Sig Dispense Refill   albuterol (PROVENTIL HFA;VENTOLIN HFA) 108 (90 Base) MCG/ACT inhaler Inhale 2 puffs into the lungs every 4 (four) hours as needed for wheezing or shortness of breath (cough, shortness of breath or wheezing.). 1 Inhaler 2   aspirin EC 81 MG tablet Take 81 mg by mouth daily.     Creatine Monohydrate POWD Take 5 g  by mouth daily.     doxazosin (CARDURA) 2 MG tablet Take 1 tablet (2 mg total) by mouth daily. 90 tablet 2   ezetimibe (ZETIA) 10 MG tablet Take 1 tablet (10 mg total) by mouth daily. 90 tablet 3   finasteride (PROSCAR) 5 MG tablet Take 1 tablet (5 mg total) by mouth daily. 90 tablet 2   fluticasone (FLONASE) 50 MCG/ACT nasal spray USE 1 SPRAY IN EACH NOSTRIL TWICE A DAY. 16 g 6   LamoTRIgine 200 MG TB24 24 hour tablet Take 1 tablet (200 mg total) by mouth at bedtime. 90 tablet 4   Melatonin 1 MG/ML LIQD Take 2 mg by mouth.     montelukast (SINGULAIR) 10 MG tablet TAKE ONE TABLET AT BEDTIME. 90 tablet 3   Multiple Vitamins-Minerals (MULTIVITAMIN WITH MINERALS) tablet Take 1 tablet by mouth daily.     omeprazole (PRILOSEC) 20 MG capsule Take 1 capsule (20 mg total) by mouth daily. 30 capsule 0   rosuvastatin (CRESTOR) 20 MG tablet Take 1 tablet (20 mg total) by mouth daily. 90 tablet 3   tamsulosin (FLOMAX) 0.4 MG CAPS capsule Take 0.4 mg by mouth daily.     No current facility-administered medications for this  visit.    Review of Systems  Respiratory: Negative.    Cardiovascular: Negative.   Neurological: Negative.     PHYSICAL EXAMINATION: BP (!) 156/73 (BP Location: Right Arm, Patient Position: Sitting, Cuff Size: Normal)   Pulse 60   Resp 20   Ht 5\' 9"  (1.753 m)   Wt 151 lb 3.2 oz (68.6 kg)   SpO2 98% Comment: RA  BMI 22.33 kg/m  Physical Exam Constitutional:      General: He is not in acute distress.    Appearance: Normal appearance. He is not ill-appearing.  HENT:     Head: Normocephalic and atraumatic.  Eyes:     Extraocular Movements: Extraocular movements intact.  Cardiovascular:     Rate and Rhythm: Normal rate.  Pulmonary:     Effort: Pulmonary effort is normal. No respiratory distress.  Chest:     Abdominal:     General: Abdomen is flat. There is no distension.  Musculoskeletal:        General: Normal range of motion.     Cervical back: Normal range of motion and neck supple.  Skin:    General: Skin is warm and dry.  Neurological:     General: No focal deficit present.     Mental Status: He is alert and oriented to person, place, and time.   Diagnostic Studies & Laboratory data:     Recent Radiology Findings:   No results found.     I have independently reviewed the above radiology studies  and reviewed the findings with the patient.   Recent Lab Findings: Lab Results  Component Value Date   WBC 8.5 07/22/2022   HGB 13.7 07/22/2022   HCT 42.1 07/22/2022   PLT 284 07/22/2022   GLUCOSE 94 07/22/2022   CHOL 153 07/27/2022   TRIG 60.0 07/27/2022   HDL 74.40 07/27/2022   LDLCALC 67 07/27/2022   ALT 20 07/27/2022   AST 33 07/27/2022   NA 135 07/22/2022   K 4.1 07/22/2022   CL 104 07/22/2022   CREATININE 1.04 10/21/2022   BUN 19 07/22/2022   CO2 24 07/22/2022   TSH 2.680 07/16/2020   HGBA1C 5.6 07/11/2020              Assessment / Plan:  77yo male with right chest wall mass.  On imaging, it appears cystic and well circumscribed.   There does not appear to be any muscular or rib involvement.  We discussed the risks and benefits of an excisional biopsy.  Given that is not causing him any issues right now he would like to continue to follow this.  He will follow-up in January 2025 with an ultrasound.   [name] 03/19/2023 10:53 AM

## 2023-03-19 ENCOUNTER — Ambulatory Visit (INDEPENDENT_AMBULATORY_CARE_PROVIDER_SITE_OTHER): Payer: Medicare Other | Admitting: Thoracic Surgery (Cardiothoracic Vascular Surgery)

## 2023-03-19 ENCOUNTER — Encounter: Payer: Self-pay | Admitting: Thoracic Surgery (Cardiothoracic Vascular Surgery)

## 2023-03-19 VITALS — BP 156/73 | HR 60 | Resp 20 | Ht 69.0 in | Wt 151.2 lb

## 2023-03-19 DIAGNOSIS — R222 Localized swelling, mass and lump, trunk: Secondary | ICD-10-CM

## 2023-04-06 ENCOUNTER — Ambulatory Visit (INDEPENDENT_AMBULATORY_CARE_PROVIDER_SITE_OTHER): Payer: Medicare Other | Admitting: Neurology

## 2023-04-06 DIAGNOSIS — R001 Bradycardia, unspecified: Secondary | ICD-10-CM

## 2023-04-06 DIAGNOSIS — G4731 Primary central sleep apnea: Secondary | ICD-10-CM | POA: Diagnosis not present

## 2023-04-06 DIAGNOSIS — G40909 Epilepsy, unspecified, not intractable, without status epilepticus: Secondary | ICD-10-CM

## 2023-04-06 DIAGNOSIS — Q282 Arteriovenous malformation of cerebral vessels: Secondary | ICD-10-CM

## 2023-04-10 ENCOUNTER — Other Ambulatory Visit: Payer: Self-pay | Admitting: Neurology

## 2023-04-10 DIAGNOSIS — G3184 Mild cognitive impairment, so stated: Secondary | ICD-10-CM

## 2023-04-10 DIAGNOSIS — G40909 Epilepsy, unspecified, not intractable, without status epilepticus: Secondary | ICD-10-CM

## 2023-04-10 DIAGNOSIS — G4731 Primary central sleep apnea: Secondary | ICD-10-CM

## 2023-04-10 NOTE — Procedures (Signed)
CPAP TITRATION STUDY   STUDY DATE: 04/06/2023      PATIENT NAME:  Anthony Juarez         DATE OF BIRTH:  [birth year]  PATIENT ID:  [id number]    TYPE OF STUDY:  CPAP  READING PHYSICIAN: Melvyn Novas, MD SCORING TECHNICIAN: Margaretann Loveless, RPSGT   HISTORY: This 77 year-old Male reports restorative sleep but was concerned about sleep apnea still being present. He has nocturnal seizures. HST confirmed 25% central apneas. . The Epworth Sleepiness Scale was 3 out of 24 (scores above or equal to 10 are suggestive of hypersomnolence).  DESCRIPTION: A sleep technologist was in attendance for the duration of the recording.  Data collection, scoring, video monitoring, and reporting were performed in compliance with the AASM Manual for the Scoring of Sleep and Associated Events; (Hypopnea is scored based on the criteria listed in Section VIII D. 1b in the AASM Manual V2.6 using a 4% oxygen desaturation rule or Hypopnea is scored based on the criteria listed in Section VIII D. 1a in the AASM Manual V2.6 using 3% oxygen desaturation and /or arousal rule).  A physician certified by the American Board of Sleep Medicine reviewed each epoch of the study.  ADDITIONAL INFORMATION:  Height: 69.0 in Weight: 152 lb (BMI 22) Neck Size: 15.0 in    MEDICATIONS: Proventil, Aspirin, Cardura, Zetia, Proscar, Flonase, Lamotrigine, Melatonin, Singulair, Multivitamin, Prilosec, Crestor, Flomax   SLEEP CONTINUITY AND SLEEP ARCHITECTURE:  Lights off was at 22:11: and lights on 05:00: (6.8 hours in bed).  [employer] EVORA FFM, size S/M and CPAP was started at 6 cm water. increased to a final 10 cm pressure with 2 cm H20 EPR.  Total sleep time was 277.5 minutes (22.2% supine; 77.8% lateral; 0.0% prone, 18.9% REM sleep), with a decreased sleep efficiency at 67.8%. Sleep latency was increased at 47.0 minutes.  Of the total sleep time, the percentage of stage N1 sleep was 4.0%, stage N2 sleep was 74.1%, stage N3 sleep  was 3.1%, and REM sleep was 18.9%. There were 3 Stage R periods observed on this study night, 43 awakenings (i.e. transitions to Stage W from any sleep stage), and 103.0 total stage transitions.  Wake after sleep onset (WASO) time accounted for 84 minutes. BODY POSITION: Duration of total sleep and percent of total sleep in their respective position is as follows: supine 61 minutes (22.2%), non-supine 216.0 minutes (77.8%); right 113 minutes (40.9%), left 102 minutes (36.9%), and prone 00 minutes (0.0%). Total supine REM sleep time was 00 minutes (0.0% of total REM sleep).  RESPIRATORY MONITORING:  Based on CMS criteria (using a 4% oxygen desaturation rule for scoring hypopneas), there were 19 apneas (0 obstructive; 19 central; 0 mixed), and 0 hypopneas.   The Apnea index was 4.1/h. Hypopnea index was 0.0/0. The apnea-hypopnea index was 4.1 /h overall , no apnea in REM sleep. There were 0 respiratory effort-related arousals (RERAs).   OXIMETRY: Respiratory events were associated with oxyhemoglobin desaturations (nadir during sleep 92% from a mean of 98%. Total sleep time spent at, or below 88% was 0.0 minutes, or 0.0% of total sleep time.  EKG:  The average heart rate during sleep was bradycardic at 46 bpm.  The maximum heart rate during sleep was 68 bpm. The maximum heart rate during recording was 68.       LIMB MOVEMENTS: There were 0 periodic limb movements of sleep (0.0/h), of which 0 (0.0/h) were associated with an arousal. AROUSAL: There were  53 arousals in total, for an arousal index of 7.6 arousals/hour.  Of these, 13 were identified as respiratory-related arousals (2.8 /h), 0 were PLM-related arousals (0.0 /h), and 40 were non-specific arousals (8.6 /h)    IMPRESSION:   1 Based on the baseline AHI as seen in the HST , this CPAP titration did lead to a reduction of AHI . The final setting of 10 cm water eliminated all apnea events. The patient slept under the setting of 10 cm water,2 cm EPR,  for 147 minutes and without any further apnoeic events.  2.  Bradycardia was present.       RECOMMENDATIONS: [employer]  EVORA FFM, size S/M  CPAP was started at 6 cm water. increased to a final 10 cm pressure with 2 cm H20 EPR. this setting will be ordered for the patient at home.    Melvyn Novas, MD                General Information  Name: Wasim, Hurlbut BMI: 22.45 Physician: Melvyn Novas, MD  ID: [id number] Height: 69.0 in Technician: Margaretann Loveless, RPSGT  Sex: Male Weight: 152.0 lb Record: [id number]  Age: 70 [Aug 15, 1945] Date: 04/06/2023     Medical & Medication History    Anthony Juarez is a 77 y.o. male patient who is seen upon referral on 12/01/2022 from Dr Terrace Arabia Margie Ege, NP, for a Sleep consultation. Pt is well, reports he tried bipap about 15-20 years ago under Dr Marcelyn Bruins, MD. He couldn't get to like it, felt poorly. He takes melatonin to help him sleep. Generally he reportedly feels refreshed in the morning but he takes daily naps- during the day. Has some snoring, wife used to mention apneas. Chief concern according to patient : "Could I still have apnea?. " I have the pleasure of seeing Anthony Juarez 12/01/22 a right-handed male , slender and active, who snores and may have a possible sleep disorder. He has a seizure disorder of  nocturnal seizures, in the first 2 hours of sleep. Used to be on dilantin, last one in 1992, after a night of staying up, alcohol . Now on Lamictal. Takes now liquid melatonin. HST followed and o documented an  pAHI (per hour): 18.3/h (11.4/h). Central sleep apnea was seen for 24% of all apneas and this makes this apnea complex sleep apnea instead of simple obstructive sleep apnea. REM pAHI: 26.1/h (21.7/h) NREM pAHI: 16.1/h (8.5)  Proventil, Aspirin, Cardura, Zetia, Proscar, Flonase, Lamotrigine, Melatonin, Singulair, Multivitamin, Prilosec, Crestor, Flomax   Sleep Disorder      Comments   The patient came into  the sleep lab for a CPAP titration study. The patient took Melatonin prior to bed. The patient had a hst with our sleep lab on 01/05/23. The total AHI was 18.3/hour and REM AHI was 26.1/hour. The patient was fitted with F&P Evora (FFM) size S/M. Mask was a good fit to face and patient tolerated the mask well. CPAP was started at [facility] with no EPR. CPAP was increased to 10cmH2O with EPR of 2 due to respiratory events. One restroom break. EKG was abnormal. No snoring. Respiratory events scored with a 4% desat. The patient slept supine and lateral. The patient had very few CSA therefore, he was not switched to any other mode of therapy. Most centrals were transitional.     CPAP start time: 10:15:00 PM CPAP end time: 05:00:28 AM   Time Total Supine Side Prone Upright  Recording (TRT) 6h 45.70m 2h 34.50m 4h  11.31m 0h 0.56m 0h 0.62m  Sleep (TST) 4h 37.76m 1h 1.3m 3h 36.41m 0h 0.33m 0h 0.5m   Latency N1 N2 N3 REM Onset Per. Slp. Eff.  Actual 0h 43.9m 0h 49.38m 1h 19.2m 1h 42.36m 0h 43.35m 0h 54.83m 68.43%   Stg Dur Wake N1 N2 N3 REM  Total 128.0 11.0 205.5 8.5 52.5  Supine 93.0 8.5 53.0 0.0 0.0  Side 35.0 2.5 152.5 8.5 52.5  Prone 0.0 0.0 0.0 0.0 0.0  Upright 0.0 0.0 0.0 0.0 0.0   Stg % Wake N1 N2 N3 REM  Total 31.6 4.0 74.1 3.1 18.9  Supine 22.9 3.1 19.1 0.0 0.0  Side 8.6 0.9 55.0 3.1 18.9  Prone 0.0 0.0 0.0 0.0 0.0  Upright 0.0 0.0 0.0 0.0 0.0     Apnea Summary Sub Supine Side Prone Upright  Total 19 Total 19 10 9  0 0    REM 0 0 0 0 0    NREM 19 10 9  0 0  Obs 0 REM 0 0 0 0 0    NREM 0 0 0 0 0  Mix 0 REM 0 0 0 0 0    NREM 0 0 0 0 0  Cen 19 REM 0 0 0 0 0    NREM 19 10 9  0 0   Rera Summary Sub Supine Side Prone Upright  Total 0 Total 0 0 0 0 0    REM 0 0 0 0 0    NREM 0 0 0 0 0   Hypopnea Summary Sub Supine Side Prone Upright  Total 0 Total 0 0 0 0 0    REM 0 0 0 0 0    NREM 0 0 0 0 0   4% Hypopnea Summary Sub Supine Side Prone Upright  Total (4%) 0 Total 0 0 0 0 0    REM 0 0 0 0 0     NREM 0 0 0 0 0     AHI Total Obs Mix Cen  4.11 Apnea 4.11 0.00 0.00 4.11   Hypopnea 0.00 -- -- --  4.11 Hypopnea (4%) 0.00 -- -- --    Total Supine Side Prone Upright  Position AHI 4.11 9.76 2.50 0.00 0.00  REM AHI 0.00   NREM AHI 5.07   Position RDI 4.11 9.76 2.50 0.00 0.00  REM RDI 0.00   NREM RDI 5.07    4% Hypopnea Total Supine Side Prone Upright  Position AHI (4%) 4.11 9.76 2.50 0.00 0.00  REM AHI (4%) 0.00   NREM AHI (4%) 5.07   Position RDI (4%) 4.11 9.76 2.50 0.00 0.00  REM RDI (4%) 0.00   NREM RDI (4%) 5.07    Desaturation Information  <100% <90% <80% <70% <60% <50% <40%  Supine 7 0 0 0 0 0 0  Side 6 0 0 0 0 0 0  Prone 0 0 0 0 0 0 0  Upright 0 0 0 0 0 0 0  Total 13 0 0 0 0 0 0  Desaturation threshold setting: 4% Minimum desaturation setting: 10 seconds SaO2 nadir: 91% The longest event was a 36 sec central Apnea with a minimum SaO2 of 95%. The lowest SaO2 was 91% associated with a 20 sec central Apnea. EKG Rates EKG Avg Max Min  Awake 46 66 39  Asleep 46 68 39  EKG Events: Bradycardia Awakening/Arousal Information # of Awakenings 43  Wake after sleep onset 84.38m  Wake after persistent sleep 77.59m   Arousal Assoc.  Arousals Index  Apneas 13 2.8  Hypopneas 0 0.0  Leg Movements 0 0.0  Snore 0.0 0.0  PTT Arousals 0 0.0  Spontaneous 40 8.6  Total 53 11.5  Myoclonus Information PLMS LMs Index  Total LMs during PLMS 0 0.0  LMs w/ Microarousals 0 0.0   LM LMs Index  w/ Microarousal 0 0.0  w/ Awakening 0 0.0  w/ Resp Event 0 0.0  Spontaneous 1 0.2  Total 1 0.2           [facility]  PAP Report    General Information  Name: Press, Casale BMI: 22 Physician: ,   ID: [id number] Height: 69 in Technician: Margaretann Loveless  Sex: Male Weight: 152 lb Record: [id number]  Age: 46 [[birth year]] Date: 04/06/2023 Scorer: Margaretann Loveless    Recommended Settings  Protocol:   N/A  Device:   N/A  Mask:   N/A  AHI:    N/A    Pressure Support:     N/A to   N/A cmH20   IPAP:     N/A to   N/A cmH20  EPAP:     N/A to   N/A cmH20      Max Pressure:     N/A  Backup Rate:     N/A  Humidity:     N/A AHI (4%):   N/A   Pressure Settings Phase THERAPY THERAPY THERAPY THERAPY   Protocol - - - -   Max Pressure - - - -   IPAP 06 08 09 10   EPAP 06 08 09 10   PS - - - -   Device - - - -   Mask - - - -   Backup Rate - - - -   Humidity - - - -  Time TRT 52.9m 79.82m 124.48m 150.36m   TST 2.19m 73.36m 55.36m 147.66m   Event Epoch 14 118 276 524  Sleep Stage % Wake 76.5 7.0 55.6 2.3   % REM 0.0 0.0 11.8 31.3   % N1 50.0 2.0 15.5 0.0   % N2 50.0 86.4 72.7 68.7   % N3 0.0 11.6 0.0 0.0  Respiratory Total Events 4 5 10  0   Obs. Apn. 0 0 0 0   Mixed Apn. 0 0 0 0   Cen. Apn. 4 5 10  0   Obs. Hyp. 0 0 0 0   Cen. Hyp. 0 0 0 0   AHI 120.00 4.08 10.91 0.00   Supine AHI 0.00 0.00 21.43 0.00   Prone AHI 0.00 0.00 0.00 0.00   Side AHI 120.00 4.08 0.00 0.00  Respiratory (4%) Obs. Hyp. (4%) 0.00 0.00 0.00 0.00   Cen. Hyp. (4%) 0.00 0.00 0.00 0.00   AHI (4%) 120.00 4.08 10.91 0.00   Supine AHI (4%) 0.00 0.00 21.43 0.00   Prone AHI (4%) 0.00 0.00 0.00 0.00   Side AHI (4%) 120.00 4.08 0.00 0.00  Desat Profile <= 90% 0.37m 0.42m 4.85m 0.74m   <= 80% 0.27m 0.24m 4.43m 0.39m   <= 70% 0.86m 0.37m 4.71m 0.10m   <= 60% 0.53m 0.4m 4.30m 0.71m  Arousal Index Apnea 120.0 0.8 8.7 0.0   Hypopnea 0.0 0.0 0.0 0.0   LM 0.0 0.0 0.0 0.0   Spontaneous 0.0 9.0 20.7 4.1

## 2023-04-13 ENCOUNTER — Telehealth: Payer: Self-pay

## 2023-04-13 NOTE — Telephone Encounter (Signed)
Contacted pt regarding SSR, LVM rq call back.  

## 2023-04-13 NOTE — Telephone Encounter (Signed)
-----   Message from Honeygo Dohmeier sent at 04/10/2023  2:47 PM EDT ----- Dr Zannie Cove Patient's HST confirmed sleep apnea to be present with 25% central apneas ( HST do underestimate central apnea- and this titration study eliminated  the central apnea at 10 cm water pressure 2 cm EPR.  I ordered an autotitration CPAP.

## 2023-04-21 NOTE — Telephone Encounter (Signed)
I called pt. I advised pt that Dr. Vickey Huger reviewed their sleep study results and found that pt was best treated under CPAP pressure of 10 cm water pressure. Dr. Vickey Huger recommends that pt starts auto CPAP. I reviewed PAP compliance expectations with the pt. Pt is agreeable to starting a CPAP. I advised pt that an order will be sent to a DME, [facility], and [facility] will call the pt within about one week after they file with the pt's insurance. [facility] will show the pt how to use the machine, fit for masks, and troubleshoot the CPAP if needed. A follow up appt was made for insurance purposes with Dr. Vickey Huger on 07/21/2023 at 8:30 am. Pt verbalized understanding to arrive 15 minutes early and bring their CPAP. Pt verbalized understanding of results. Pt had no questions at this time but was encouraged to call back if questions arise. I have sent the order to [facility] and have received confirmation that they have received the order.

## 2023-04-30 ENCOUNTER — Ambulatory Visit (INDEPENDENT_AMBULATORY_CARE_PROVIDER_SITE_OTHER): Payer: Medicare Other | Admitting: Family Medicine

## 2023-04-30 VITALS — BP 104/70 | HR 68 | Ht 69.0 in | Wt 151.8 lb

## 2023-04-30 DIAGNOSIS — Z23 Encounter for immunization: Secondary | ICD-10-CM

## 2023-04-30 DIAGNOSIS — K5900 Constipation, unspecified: Secondary | ICD-10-CM | POA: Diagnosis not present

## 2023-04-30 LAB — CBC
HCT: 46.3 % (ref 39.0–52.0)
Hemoglobin: 15.2 g/dL (ref 13.0–17.0)
MCHC: 32.9 g/dL (ref 30.0–36.0)
MCV: 92.7 fL (ref 78.0–100.0)
Platelets: 232 10*3/uL (ref 150.0–400.0)
RBC: 5 Mil/uL (ref 4.22–5.81)
RDW: 13.8 % (ref 11.5–15.5)
WBC: 7.7 10*3/uL (ref 4.0–10.5)

## 2023-04-30 LAB — TSH: TSH: 2.88 u[IU]/mL (ref 0.35–5.50)

## 2023-04-30 NOTE — Progress Notes (Signed)
Subjective:  Patient ID: Anthony Juarez, male    DOB: 10-07-1945  Age: 77 y.o. MRN: [id number]  CC:  Chief Complaint  Patient presents with   Constipation    Pt c/o constipation since Aug and has been using Miralax and Magnesium OTC.    HPI [name] presents for   Constipation Present past few months - since August. No change in meds or bowel routine prior.  Has tried MiraLAX over-the-counter (once per day up to 3 days in a row every few weeks). Once per week usually. magnesium over-the-counter daily. Does have BM daily usually, hard stools. No blood in stool or change in caliber. Straining for BM. Some increased cramping, but history of swallowing air.  No nausea or vomiting, night sweats, fever, weight loss or persistent abdominal pain.  No recent narcotic pain medications.  He is on lamotrigine, but has been on that medication for some time. No recent dose changes.  Colonoscopy 08/24/2018 - Dr. Elnoria Howard. No repeat planned.  No fiber supplements.  Enema once when at worst in August prior to hike.  Regular hiking/exercise. 850 miles past year.  Water intake - variable.   History Patient Active Problem List   Diagnosis Date Noted   Chest wall mass 12/04/2022   History of sleep apnea 12/01/2022   Nocturnal epilepsy (HCC) 12/01/2022   Reactive airways dysfunction syndrome (HCC) 12/01/2022   Family history of early CAD 05/05/2021   Mild cognitive impairment 06/27/2020   Chronic sinus bradycardia 07/09/2018   Abnormal bowel movement 07/09/2018   Hyperinflation of lungs 07/09/2018   Non-seasonal allergic rhinitis 07/09/2018   Abnormal finding on MRI of brain 12/04/2016   Benign prostatic hyperplasia with incomplete bladder emptying 11/11/2016   Mixed hyperlipidemia 05/13/2016   Esophageal reflux 03/28/2015   Shortness of breath 03/28/2015   Skin lesion of face 03/28/2015   AVM (arteriovenous malformation) brain 06/15/2013   Seizure disorder (HCC) 01/19/2013   Central  sleep apnea 06/10/2012   Epigastric pain 06/06/2012   Gallstones 06/06/2012   Nausea 06/06/2012   Past Medical History:  Diagnosis Date   Allergy    Arthritis    Asthma    childhood   AVM (arteriovenous malformation) brain 06/15/2013   Balanitis    Benign prostatic hypertrophy    Central sleep apnea    Cholecystitis with cholelithiasis    Chronic sinus bradycardia 07/09/2018   Convulsive disorder (HCC)    Esophageal reflux 03/28/2015   History of nuclear stress test    Myoview 11/16: EF 60%, normal perfusion, Low Risk   Hyperlipidemia    Prostatitis    Seizures (HCC)    Past Surgical History:  Procedure Laterality Date   ELBOW SURGERY  11/11/2000   repair   FRACTURE SURGERY     LEFT ELBOW   TONSILLECTOMY     Allergies  Allergen Reactions   Penicillins Rash    Did it involve swelling of the face/tongue/throat, SOB, or low BP? No Did it involve sudden or severe rash/hives, skin peeling, or any reaction on the inside of your mouth or nose? Yes Did you need to seek medical attention at a hospital or doctor's office? No When did it last happen?  Decades ago. If all above answers are "NO", may proceed with cephalosporin use.    Prior to Admission medications   Medication Sig Start Date End Date Taking? Authorizing Provider  albuterol (PROVENTIL HFA;VENTOLIN HFA) 108 (90 Base) MCG/ACT inhaler Inhale 2 puffs into the lungs every 4 (four) hours  as needed for wheezing or shortness of breath (cough, shortness of breath or wheezing.). 06/01/17  Yes Sherren Mocha, MD  aspirin EC 81 MG tablet Take 81 mg by mouth daily.   Yes [provider]  Creatine Monohydrate POWD Take 5 g by mouth daily. 01/12/23  Yes [provider]  doxazosin (CARDURA) 2 MG tablet Take 1 tablet (2 mg total) by mouth daily. 01/28/23  Yes Shade Flood, MD  ezetimibe (ZETIA) 10 MG tablet Take 1 tablet (10 mg total) by mouth daily. 08/04/22  Yes Jake Bathe, MD  finasteride (PROSCAR) 5 MG  tablet Take 1 tablet (5 mg total) by mouth daily. 01/28/23  Yes Shade Flood, MD  fluticasone [facility]) 50 MCG/ACT nasal spray USE 1 SPRAY IN EACH NOSTRIL TWICE A DAY. 03/17/22  Yes Shade Flood, MD  LamoTRIgine 200 MG TB24 24 hour tablet Take 1 tablet (200 mg total) by mouth at bedtime. 10/21/22  Yes Glean Salvo, NP  Melatonin 1 MG/ML LIQD Take 2 mg by mouth.   Yes [provider]  montelukast (SINGULAIR) 10 MG tablet TAKE ONE TABLET AT BEDTIME. 07/02/22  Yes Shade Flood, MD  Multiple Vitamins-Minerals (MULTIVITAMIN WITH MINERALS) tablet Take 1 tablet by mouth daily.   Yes [provider]  omeprazole (PRILOSEC) 20 MG capsule Take 1 capsule (20 mg total) by mouth daily. 01/22/22  Yes Shade Flood, MD  rosuvastatin (CRESTOR) 20 MG tablet Take 1 tablet (20 mg total) by mouth daily. 08/04/22  Yes Jake Bathe, MD  tamsulosin (FLOMAX) 0.4 MG CAPS capsule Take 0.4 mg by mouth daily. 11/28/22  Yes [provider]   Social History   Socioeconomic History   Marital status: Married    Spouse name: Natasha Mead   Number of children: Not on file   Years of education: Not on file   Highest education level: Bachelor's degree (e.g., BA, AB, BS)  Occupational History   Occupation: retired    Associate Professor: RETIRED  Tobacco Use   Smoking status: Former    Current packs/day: 0.00    Average packs/day: 1 pack/day for 10.0 years (10.0 ttl pk-yrs)    Types: Cigarettes    Start date: 07/14/1967    Quit date: 07/13/1977    Years since quitting: 45.8   Smokeless tobacco: Never  Vaping Use   Vaping status: Never Used  Substance and Sexual Activity   Alcohol use: Yes    Alcohol/week: 2.0 standard drinks of alcohol    Types: 2 Cans of beer per week    Comment: 2 beers a day   Drug use: No   Sexual activity: Not Currently    Birth control/protection: Post-menopausal, Abstinence  Other Topics Concern   Not on file  Social History Narrative   Patient worked in Psychologist, sport and exercise.   Social Determinants of Health   Financial Resource Strain: Low Risk  (04/30/2023)   Overall Financial Resource Strain (CARDIA)    Difficulty of Paying Living Expenses: Not hard at all  Food Insecurity: No Food Insecurity (04/30/2023)   Hunger Vital Sign    Worried About Running Out of Food in the Last Year: Never true    Ran Out of Food in the Last Year: Never true  Transportation Needs: No Transportation Needs (04/30/2023)   PRAPARE - Administrator, Civil Service (Medical): No    Lack of Transportation (Non-Medical): No  Physical Activity: Sufficiently Active (04/30/2023)   Exercise Vital Sign  Days of Exercise per Week: 4 days    Minutes of Exercise per Session: 60 min  Stress: No Stress Concern Present (10/21/2022)   [employer] of Occupational Health - Occupational Stress Questionnaire    Feeling of Stress : Not at all  Social Connections: Socially Integrated (04/30/2023)   Social Connection and Isolation Panel [NHANES]    Frequency of Communication with Friends and Family: Twice a week    Frequency of Social Gatherings with Friends and Family: Once a week    Attends Religious Services: 1 to 4 times per year    Active Member of [employer] or Organizations: No    Attends Engineer, structural: More than 4 times per year    Marital Status: Married  Catering manager Violence: Not At Risk (10/21/2022)   Humiliation, Afraid, Rape, and Kick questionnaire    Fear of Current or Ex-Partner: No    Emotionally Abused: No    Physically Abused: No    Sexually Abused: No    Review of Systems   Objective:   Vitals:   04/30/23 0906  BP: 104/70  Pulse: 68  SpO2: 100%  Weight: 151 lb 12.8 oz (68.9 kg)  Height: 5\' 9"  (1.753 m)     Physical Exam Vitals reviewed.  Constitutional:      Appearance: He is well-developed.  HENT:     Head: Normocephalic and atraumatic.  Neck:     Vascular: No carotid bruit or JVD.     Comments: No thyromegaly  or mass appreciated. Cardiovascular:     Rate and Rhythm: Normal rate and regular rhythm.     Heart sounds: Normal heart sounds. No murmur heard. Pulmonary:     Effort: Pulmonary effort is normal.     Breath sounds: Normal breath sounds. No rales.  Abdominal:     General: Abdomen is flat. There is no distension.     Palpations: Abdomen is soft. There is no mass.     Tenderness: There is no abdominal tenderness.  Musculoskeletal:     Right lower leg: No edema.     Left lower leg: No edema.  Skin:    General: Skin is warm and dry.  Neurological:     Mental Status: He is alert and oriented to person, place, and time.  Psychiatric:        Mood and Affect: Mood normal.        Assessment & Plan:  Decory Freni is a 77 y.o. male . Constipation, unspecified constipation type - Plan: TSH, CBC  -Intermittent symptoms for the past few months.  Does have bowel movement every day but still hard stools and straining.  Check CBC, TSH but unlikely thyroid cause.  No red flags on history.  Increased fluid intake with fiber supplement discussed, MiraLAX up to daily or space out dosing if diarrhea/loose stools.  Handout given on constipation prevention and treatment.  Bowel regimen and routine discussed with recheck in 2 weeks.  Need for influenza vaccination - Plan: Flu Vaccine Trivalent High Dose (Fluad)   No orders of the defined types were placed in this encounter.  Patient Instructions  See information below on constipation.  I will check some lab work today, but I expect that to be okay.  Try increasing water intake, goal of 48 to 64 ounces per day.  Fiber supplement like Citrucel or Metamucil once per day.  MiraLAX can be taken up to once per day unless she started to have loose stools, then decrease to  every other day or every third day.  Try establishing routine after meal to see if bowel movements become more regular.  Will recheck in 2 weeks but let me know if there are questions in  the meantime.  Constipation, Adult Constipation is when a person has fewer than three bowel movements in a week, has difficulty having a bowel movement, or has stools (feces) that are dry, hard, or larger than normal. Constipation may be caused by an underlying condition. It may become worse with age if a person takes certain medicines and does not take in enough fluids. Follow these instructions at home: Eating and drinking  Eat foods that have a lot of fiber, such as beans, whole grains, and fresh fruits and vegetables. Limit foods that are low in fiber and high in fat and processed sugars, such as fried or sweet foods. These include french fries, hamburgers, cookies, candies, and soda. Drink enough fluid to keep your urine pale yellow. General instructions Exercise regularly or as told by your health care provider. Try to do 150 minutes of moderate exercise each week. Use the bathroom when you have the urge to go. Do not hold it in. Take over-the-counter and prescription medicines only as told by your health care provider. This includes any fiber supplements. During bowel movements: Practice deep breathing while relaxing the lower abdomen. Practice pelvic floor relaxation. Watch your condition for any changes. Let your health care provider know about them. Keep all follow-up visits as told by your health care provider. This is important. Contact a health care provider if: You have pain that gets worse. You have a fever. You do not have a bowel movement after 4 days. You vomit. You are not hungry or you lose weight. You are bleeding from the opening between the buttocks (anus). You have thin, pencil-like stools. Get help right away if: You have a fever and your symptoms suddenly get worse. You leak stool or have blood in your stool. Your abdomen is bloated. You have severe pain in your abdomen. You feel dizzy or you faint. Summary Constipation is when a person has fewer than three  bowel movements in a week, has difficulty having a bowel movement, or has stools (feces) that are dry, hard, or larger than normal. Eat foods that have a lot of fiber, such as beans, whole grains, and fresh fruits and vegetables. Drink enough fluid to keep your urine pale yellow. Take over-the-counter and prescription medicines only as told by your health care provider. This includes any fiber supplements. This information is not intended to replace advice given to you by your health care provider. Make sure you discuss any questions you have with your health care provider. Document Revised: 05/13/2022 Document Reviewed: 05/13/2022 Elsevier Patient Education  2024 Elsevier Inc.     Signed,   Meredith Staggers, MD [facility], [facility] 04/30/23 9:47 AM

## 2023-04-30 NOTE — Patient Instructions (Addendum)
See information below on constipation.  I will check some lab work today, but I expect that to be okay.  Try increasing water intake, goal of 48 to 64 ounces per day.  Fiber supplement like Citrucel or Metamucil once per day.  MiraLAX can be taken up to once per day unless she started to have loose stools, then decrease to every other day or every third day.  Try establishing routine after meal to see if bowel movements become more regular.  Will recheck in 2 weeks but let me know if there are questions in the meantime.  Constipation, Adult Constipation is when a person has fewer than three bowel movements in a week, has difficulty having a bowel movement, or has stools (feces) that are dry, hard, or larger than normal. Constipation may be caused by an underlying condition. It may become worse with age if a person takes certain medicines and does not take in enough fluids. Follow these instructions at home: Eating and drinking  Eat foods that have a lot of fiber, such as beans, whole grains, and fresh fruits and vegetables. Limit foods that are low in fiber and high in fat and processed sugars, such as fried or sweet foods. These include french fries, hamburgers, cookies, candies, and soda. Drink enough fluid to keep your urine pale yellow. General instructions Exercise regularly or as told by your health care provider. Try to do 150 minutes of moderate exercise each week. Use the bathroom when you have the urge to go. Do not hold it in. Take over-the-counter and prescription medicines only as told by your health care provider. This includes any fiber supplements. During bowel movements: Practice deep breathing while relaxing the lower abdomen. Practice pelvic floor relaxation. Watch your condition for any changes. Let your health care provider know about them. Keep all follow-up visits as told by your health care provider. This is important. Contact a health care provider if: You have pain that  gets worse. You have a fever. You do not have a bowel movement after 4 days. You vomit. You are not hungry or you lose weight. You are bleeding from the opening between the buttocks (anus). You have thin, pencil-like stools. Get help right away if: You have a fever and your symptoms suddenly get worse. You leak stool or have blood in your stool. Your abdomen is bloated. You have severe pain in your abdomen. You feel dizzy or you faint. Summary Constipation is when a person has fewer than three bowel movements in a week, has difficulty having a bowel movement, or has stools (feces) that are dry, hard, or larger than normal. Eat foods that have a lot of fiber, such as beans, whole grains, and fresh fruits and vegetables. Drink enough fluid to keep your urine pale yellow. Take over-the-counter and prescription medicines only as told by your health care provider. This includes any fiber supplements. This information is not intended to replace advice given to you by your health care provider. Make sure you discuss any questions you have with your health care provider. Document Revised: 05/13/2022 Document Reviewed: 05/13/2022 Elsevier Patient Education  2024 [employer].

## 2023-05-11 NOTE — Progress Notes (Unsigned)
Patient: Anthony Juarez Date of Birth: 08-22-1945  Reason for Visit: Follow up for seizures, mild cognitive impairment History from: Patient Primary Neurologist: Dr. Terrace Arabia   ASSESSMENT AND PLAN 77 y.o. year old male  1.  Epilepsy -No recurrent seizures, continue Lamictal XR 200 mg daily  2.  Right frontal cavernous angioma, supratentorium small vessel disease -MRI brain May 2024 showed stable right frontal developmental venous anomaly and associated cavernous malformation.  Stable mild chronic small vessel disease.  3.  Mild cognitive impairment -MOCA 30/30 (28/30) -Referral to neuropsychological evaluation given memory concerns better characterize memory deficits  -Check ATN profile today  -Encouraged to physical active   4.  Complex sleep apnea, central sleep apnea -Has completed CPAP titration, had his 1st night of CPAP last night, seeing Dr. Vickey Huger in January   Follow up with me in 6 months.   HISTORY  Anthony Juarez is a 77 yo WM, following up for seizure disorder   He has PMHx of seizure since infant, he was treated with dilantin and phenobarbital for a long time, was eventurally tapered off after seizure free since age 85, he had one recurrent seizure at age 89, was put back on dilantin 400mg  qhs, has been on it since.   He had Bachelor's degree, retired as Building services engineer, Animal nutritionist  in 2009, moved to [place] about 2.5 years ago, now he stays at home most of time, rarely initiated activities, felt fatigue, difficulty concontrating, getting worse since summer of 2013.  His wife complains that he is not up to his house projects anymore, frequent nocturia, which has prompted recent treatment with desmopression, which helped his frequent night time awaking,  He also has sleep apnea, but could not tolerate his BiPAP machine.He is seen by Dr. Shelle Iron.   Dilantin level was 32 while he was taking Dilantin 100 mg 4 tablets each night, level decreased to 22 while  he was taking Dilantin 100 mg 3 tablets each night, he has developed gingival hypertrophy, his brain fogginess has much improved with decreased dose of Dilantin, especially after he stopped taking Desopressin,    MRI scan of the brain showed a cavernous angioma with remote age hemorrhage in the right frontal subcortical region with  and adjacent  venous angioma.  There are moderate changes of chronic microvascular ischemia and mild degree of generalized cerebral atrophy.   Followup visit today patient has been switched to Keppra XR 750mg  daily. He is off Dilantin totally and his brain fogginess has improved. He has not had further seizure activity.   UPDATE Jan 19th 2015: He is now taking keppra xr 750mg  qhs, last seizure 1992, nocturnal seizure,  he only has mild fatigue, no longer having brain foggy sensation We have reviewed MRI taking January 2015, continue the right inferior frontal cavernous venous angioma, mild atrophy, moderate small vessel disease,   UPDATE Jan 19th 2016; I have reviewed MRI brain (with and without): right frontal juxtacortical T2 heterogenous lesion (1.4x1.0cm), with "popcorn" appearance, consistent with cavernous malformation. There is an associated small developmental venous anomaly.   Multiple round and ovoid, periventricular, subcortical and juxtacortical T2 hyperintense foci. These findings are non-specific and considerations include autoimmune, inflammatory, post-infectious, microvascular ischemic or migraine associated etiologies.     He is taking Keppra XR 750 mg every night, there was no recurrent seizure   UPDATE Aug 01 2015: He had sleep study recently,  No need for CPAP machine, he is taking keppra xr 750mg  qhs, no recurrent  seizure.    We have reviewed MRI of the brain with and without contrast in January 2017: 1.   A focus of heterogenous signal in the superficial right frontal lobe with a hemosiderin rim and adjacent venous anomaly. This has  characteristics of a cavernous malformation.   Compared to the 07/20/2013 MRI, there has been no definite change. 2.   Scattered T2/FLAIR hyperintense foci, predominantly in the subcortical deep white matter of both hemispheres. This is stable compared to the prior MRI. She is a nonspecific finding and potential etiology could be chronic microvascular ischemic change, migraine, chronic demyelination.    UPDATE Jun 21 2018: He is overall doing well, taking Keppra 750 mg tablets half tablets twice a day, does complain some moodiness, discussed with patient we decided to switch him to lamotrigine 200 mg every day   UPDATE Jun 27 2020: He is overall doing very well, has no recurrent seizure, tolerating lamotrigine ER 200 mg well, complains of difficulty sleeping occasionally, especially after using bathroom at nighttime, he hiked with his wife regularly,   Recent couple years, noticed mild memory loss, word finding difficulties, MoCA examination 29/30 today,   We again personally reviewed MRI of brain with without contrast in 2017: Focus of heterogeneous signal at the superficial right frontal lobe, with hemosiderin rim and adjacent venous anomaly, characteristic of a cavernous malformation,    Update October 15, 2021 SS: Here alone, trouble with short term memory names, places. Does well driving, medications, keeps calendar. Lives with wife. Active with hiking. No seizures. Remains on Lamictal ER 200 mg at 5 PM. Has BPH, nocturia. Has macular degeneration, lower back pain on Meloxicam. MOCA 30/30.  Update October 21, 2022 SS: MOCA 28/30. Still having trouble with short term memory, frustrating to him, sometimes trouble with directions. Watch a lot of movies, sometimes can't remember the plot, or title. Word finding trouble. Still hiking 900 miles a year.feels memory trouble is progressing, his mother had Dementia. He was told central sleep apnea in the past, couldn't tolerate BIPAP. Is not sleeping well,  nocturia at least twice. He snores. No morning headache, energy level is variable, he is still active, his BIPAP was loud. ESS 2  Update May 12, 2023 SS: Saw Dr. Vickey Huger for sleep consult May 2024, HST 01/13/2023 mild to moderate OSA with significant REM sleep dependency, high portion of central apnea.  CPAP titration 04/02/2023 placed on 10 cm water with elimination of apnea events.  MRI brain May 2024 showed stable right frontal developmental versus venous anomaly and associated cavernous malformation. Just picked up his CPAP last night. Slept well last night. I referred him to neuropsych, he declined the referral at the time, is now interested. Still having issues with keeping up with dates, directions, trouble finding the right word. No seizures. Has hiked 875 miles this year.   REVIEW OF SYSTEMS: Out of a complete 14 system review of symptoms, the patient complains only of the following symptoms, and all other reviewed systems are negative.  See HPI  ALLERGIES: Allergies  Allergen Reactions   Penicillins Rash    Did it involve swelling of the face/tongue/throat, SOB, or low BP? No Did it involve sudden or severe rash/hives, skin peeling, or any reaction on the inside of your mouth or nose? Yes Did you need to seek medical attention at a hospital or doctor's office? No When did it last happen?  Decades ago. If all above answers are "NO", may proceed with cephalosporin use.  HOME MEDICATIONS: Outpatient Medications Prior to Visit  Medication Sig Dispense Refill   albuterol (PROVENTIL HFA;VENTOLIN HFA) 108 (90 Base) MCG/ACT inhaler Inhale 2 puffs into the lungs every 4 (four) hours as needed for wheezing or shortness of breath (cough, shortness of breath or wheezing.). 1 Inhaler 2   aspirin EC 81 MG tablet Take 81 mg by mouth daily.     Creatine Monohydrate POWD Take 5 g by mouth daily.     doxazosin (CARDURA) 2 MG tablet Take 1 tablet (2 mg total) by mouth daily. 90 tablet 2    ezetimibe (ZETIA) 10 MG tablet Take 1 tablet (10 mg total) by mouth daily. 90 tablet 3   finasteride (PROSCAR) 5 MG tablet Take 1 tablet (5 mg total) by mouth daily. 90 tablet 2   fluticasone (FLONASE) 50 MCG/ACT nasal spray USE 1 SPRAY IN EACH NOSTRIL TWICE A DAY. 16 g 6   MAGNESIUM CHLORIDE-CALCIUM PO Take 300 mg by mouth daily.     Melatonin 1 MG/ML LIQD Take 2 mg by mouth.     montelukast (SINGULAIR) 10 MG tablet TAKE ONE TABLET AT BEDTIME. 90 tablet 3   Multiple Vitamins-Minerals (MULTIVITAMIN WITH MINERALS) tablet Take 1 tablet by mouth daily.     omeprazole (PRILOSEC) 20 MG capsule Take 1 capsule (20 mg total) by mouth daily. 30 capsule 0   rosuvastatin (CRESTOR) 20 MG tablet Take 1 tablet (20 mg total) by mouth daily. 90 tablet 3   tamsulosin (FLOMAX) 0.4 MG CAPS capsule Take 0.4 mg by mouth daily.     LamoTRIgine 200 MG TB24 24 hour tablet Take 1 tablet (200 mg total) by mouth at bedtime. 90 tablet 4   No facility-administered medications prior to visit.    PAST MEDICAL HISTORY: Past Medical History:  Diagnosis Date   Allergy    Arthritis    Asthma    childhood   AVM (arteriovenous malformation) brain 06/15/2013   Balanitis    Benign prostatic hypertrophy    Central sleep apnea    Cholecystitis with cholelithiasis    Chronic sinus bradycardia 07/09/2018   Convulsive disorder (HCC)    Esophageal reflux 03/28/2015   History of nuclear stress test    Myoview 11/16: EF 60%, normal perfusion, Low Risk   Hyperlipidemia    Prostatitis    Seizures (HCC)     PAST SURGICAL HISTORY: Past Surgical History:  Procedure Laterality Date   ELBOW SURGERY  11/11/2000   repair   FRACTURE SURGERY     LEFT ELBOW   TONSILLECTOMY      FAMILY HISTORY: Family History  Problem Relation Age of Onset   Heart disease Father        cabg 39's   Cancer Father        Prostate   Heart disease Maternal Grandfather        Late 44's   Dementia Mother     SOCIAL HISTORY: Social History    Socioeconomic History   Marital status: Married    Spouse name: Natasha Mead   Number of children: Not on file   Years of education: Not on file   Highest education level: Bachelor's degree (e.g., BA, AB, BS)  Occupational History   Occupation: retired    Associate Professor: RETIRED  Tobacco Use   Smoking status: Former    Current packs/day: 0.00    Average packs/day: 1 pack/day for 10.0 years (10.0 ttl pk-yrs)    Types: Cigarettes    Start date: 07/14/1967  Quit date: 07/13/1977    Years since quitting: 45.8   Smokeless tobacco: Never  Vaping Use   Vaping status: Never Used  Substance and Sexual Activity   Alcohol use: Yes    Alcohol/week: 2.0 standard drinks of alcohol    Types: 2 Cans of beer per week    Comment: occ   Drug use: No   Sexual activity: Not Currently    Birth control/protection: Post-menopausal, Abstinence  Other Topics Concern   Not on file  Social History Narrative   Patient worked in Engineer, materials.   Social Determinants of Health   Financial Resource Strain: Low Risk  (04/30/2023)   Overall Financial Resource Strain (CARDIA)    Difficulty of Paying Living Expenses: Not hard at all  Food Insecurity: No Food Insecurity (04/30/2023)   Hunger Vital Sign    Worried About Running Out of Food in the Last Year: Never true    Ran Out of Food in the Last Year: Never true  Transportation Needs: No Transportation Needs (04/30/2023)   PRAPARE - Administrator, Civil Service (Medical): No    Lack of Transportation (Non-Medical): No  Physical Activity: Sufficiently Active (04/30/2023)   Exercise Vital Sign    Days of Exercise per Week: 4 days    Minutes of Exercise per Session: 60 min  Stress: No Stress Concern Present (10/21/2022)   [employer] of Occupational Health - Occupational Stress Questionnaire    Feeling of Stress : Not at all  Social Connections: Socially Integrated (04/30/2023)   Social Connection and Isolation Panel [NHANES]    Frequency  of Communication with Friends and Family: Twice a week    Frequency of Social Gatherings with Friends and Family: Once a week    Attends Religious Services: 1 to 4 times per year    Active Member of Clubs or Organizations: No    Attends Engineer, structural: More than 4 times per year    Marital Status: Married  Catering manager Violence: Not At Risk (10/21/2022)   Humiliation, Afraid, Rape, and Kick questionnaire    Fear of Current or Ex-Partner: No    Emotionally Abused: No    Physically Abused: No    Sexually Abused: No   PHYSICAL EXAM  Vitals:   05/12/23 0843 05/12/23 0852  BP: (!) 144/86 (!) 154/84  Pulse: (!) 58 78    There is no height or weight on file to calculate BMI.    05/12/2023    8:46 AM 10/21/2022    9:19 AM 10/15/2021   10:19 AM 06/27/2020    3:00 PM  Montreal Cognitive Assessment   Visuospatial/ Executive (0/5) 5 5 5 5   Naming (0/3) 3 3 3 3   Attention: Read list of digits (0/2) 2 2 2 2   Attention: Read list of letters (0/1) 1 1 1 1   Attention: Serial 7 subtraction starting at 100 (0/3) 3 1 3 3   Language: Repeat phrase (0/2) 2 2 2 2   Language : Fluency (0/1) 1 1 1 1   Abstraction (0/2) 2 2 2 2   Delayed Recall (0/5) 5 5 5 4   Orientation (0/6) 6 6 6 6   Total 30 28 30 29   Adjusted Score (based on education) 30  30    Generalized: Well developed, in no acute distress  Neurological examination  Mentation: Alert oriented to time, place, history taking. Follows all commands speech and language fluent Cranial nerve II-XII: Pupils were equal round reactive to light. Extraocular movements were full,  visual field were full on confrontational test. Facial sensation and strength were normal.  Head turning and shoulder shrug  were normal and symmetric. Motor: The motor testing reveals 5 over 5 strength of all 4 extremities. Good symmetric motor tone is noted throughout.  Sensory: Sensory testing is intact to soft touch on all 4 extremities. No evidence of  extinction is noted.  Coordination: Cerebellar testing reveals good finger-nose-finger and heel-to-shin bilaterally.  Gait and station: Gait is normal. Tandem gait is normal.  Reflexes: Deep tendon reflexes are symmetric and normal bilaterally.   DIAGNOSTIC DATA (LABS, IMAGING, TESTING) - I reviewed patient records, labs, notes, testing and imaging myself where available.  Lab Results  Component Value Date   WBC 7.7 04/30/2023   HGB 15.2 04/30/2023   HCT 46.3 04/30/2023   MCV 92.7 04/30/2023   PLT 232.0 04/30/2023      Component Value Date/Time   NA 135 07/22/2022 1706   NA 141 07/11/2020 0851   K 4.1 07/22/2022 1706   CL 104 07/22/2022 1706   CO2 24 07/22/2022 1706   GLUCOSE 94 07/22/2022 1706   BUN 19 07/22/2022 1706   BUN 15 07/11/2020 0851   CREATININE 1.04 10/21/2022 1011   CREATININE 1.25 05/14/2016 0900   CALCIUM 9.0 07/22/2022 1706   PROT 6.5 07/27/2022 0957   PROT 6.0 07/11/2020 0851   ALBUMIN 4.3 07/27/2022 0957   ALBUMIN 4.2 07/11/2020 0851   AST 33 07/27/2022 0957   ALT 20 07/27/2022 0957   ALKPHOS 56 07/27/2022 0957   BILITOT 0.8 07/27/2022 0957   BILITOT 0.5 07/11/2020 0851   GFRNONAA >60 07/22/2022 1706   GFRAA 90 07/11/2020 0851   Lab Results  Component Value Date   CHOL 153 07/27/2022   HDL 74.40 07/27/2022   LDLCALC 67 07/27/2022   TRIG 60.0 07/27/2022   CHOLHDL 2 07/27/2022   Lab Results  Component Value Date   HGBA1C 5.6 07/11/2020   Lab Results  Component Value Date   VITAMINB12 516 07/16/2020   Lab Results  Component Value Date   TSH 2.88 04/30/2023    Margie Ege, AGNP-C, DNP 05/12/2023, 9:07 AM [facility] [address], [address] [place], [place] [address] [phone]

## 2023-05-12 ENCOUNTER — Telehealth: Payer: Self-pay | Admitting: Neurology

## 2023-05-12 ENCOUNTER — Encounter: Payer: Self-pay | Admitting: Neurology

## 2023-05-12 ENCOUNTER — Ambulatory Visit (INDEPENDENT_AMBULATORY_CARE_PROVIDER_SITE_OTHER): Payer: Medicare Other | Admitting: Neurology

## 2023-05-12 VITALS — BP 154/84 | HR 78

## 2023-05-12 DIAGNOSIS — G40909 Epilepsy, unspecified, not intractable, without status epilepticus: Secondary | ICD-10-CM | POA: Diagnosis not present

## 2023-05-12 DIAGNOSIS — G3184 Mild cognitive impairment, so stated: Secondary | ICD-10-CM

## 2023-05-12 DIAGNOSIS — G4731 Primary central sleep apnea: Secondary | ICD-10-CM

## 2023-05-12 MED ORDER — LAMOTRIGINE ER 200 MG PO TB24: 1.0000 | ORAL_TABLET | Freq: Every day | ORAL | 4 refills | Status: DC

## 2023-05-12 NOTE — Addendum Note (Signed)
Addended by: Glean Salvo on: 05/12/2023 09:22 AM   Modules accepted: Orders

## 2023-05-12 NOTE — Telephone Encounter (Signed)
Referral sent to [facility], phone # [phone]. 

## 2023-05-12 NOTE — Patient Instructions (Signed)
Continue CPAP use, keep appointment with Dr. Vickey Huger  Check ATN profile  Continue current medications Referral to neuropsych for memory issues Follow up in 6 months

## 2023-05-13 ENCOUNTER — Ambulatory Visit: Payer: Medicare Other | Admitting: Family Medicine

## 2023-05-13 ENCOUNTER — Encounter: Payer: Self-pay | Admitting: Family Medicine

## 2023-05-13 VITALS — BP 138/76 | HR 56 | Temp 97.9°F | Ht 69.0 in | Wt 154.4 lb

## 2023-05-13 DIAGNOSIS — K5900 Constipation, unspecified: Secondary | ICD-10-CM | POA: Diagnosis not present

## 2023-05-13 DIAGNOSIS — Z23 Encounter for immunization: Secondary | ICD-10-CM | POA: Diagnosis not present

## 2023-05-13 NOTE — Patient Instructions (Signed)
Glad to hear the constipation has improved.  Continue Metamucil or other fiber supplement daily.  MiraLAX if needed.  Follow-up if the symptoms return.  Shingles vaccine and RSV vaccine options at your pharmacy.  Pneumonia vaccine updated today.  Let me know if there are questions.  There is a recommendation for RSV vaccine for patients over age 75.   Typically would recommend the RSV vaccine as most important for patients over age 79 that have comorbidities that put them at increased risk for severe disease (heart disease such as congestive heart failure, coronary artery disease, lung disease such as asthma or COPD, kidney disease, liver disease, diabetes, chronic or progressive neurologic or muscular conditions, immunosuppressed, or being frail or of advanced age).  For others that do not have these risk factors, there still is some benefit from vaccination since age is one of the main risk factors for developing severe disease however baseline risk of developing severe disease and requiring hospitalization is likely to be lower compared to those that have comorbidities in addition to age.   CDC does have some information as well: ToyProtection.fi

## 2023-05-13 NOTE — Progress Notes (Signed)
Subjective:  Patient ID: Anthony Juarez, male    DOB: 11/10/1945  Age: 77 y.o. MRN: [id number]  CC:  Chief Complaint  Patient presents with   Follow-up    HPI Deny Anzualda presents for   Constipation: Discussed October 18 visit.  Constipation issues since August.  CBC, TSH ordered but those are reassuring.  No fluid flags on history at that time.  Increase fluid intake with fiber supplement discussed, MiraLAX up to daily use, handout given on constipation prevention and treatment.   Since last visit - doing better. Used miralax 3 days in a row. Using at night. Helped to normalize BM, then maintained with metamucil daily.  No BRBPR/melena. No abd pain, no n/v. No fever or night sweats.   Now using CPAP past 2 nights. More rested. Doing well.   HM:  RSV vacccine discussed - plans at his pharmacy.  Prevnar 20 today.  Shingrix - recommended at pharmacy.   Immunization History  Administered Date(s) Administered   DTaP 01/15/2011   Fluad Quad(high Dose 65+) 04/20/2019, 04/25/2020, 08/07/2020, 07/23/2021, 07/27/2022   Fluad Trivalent(High Dose 65+) 04/30/2023   Influenza Whole 05/22/2011, 04/12/2012   Influenza, High Dose Seasonal PF 05/03/2018   Influenza,inj,Quad PF,6+ Mos 03/28/2015, 05/14/2016, 05/17/2017   PFIZER(Purple Top)SARS-COV-2 Vaccination 09/03/2019, 09/27/2019, 04/27/2020, 01/23/2021   Pneumococcal Conjugate-13 03/28/2015   Pneumococcal Polysaccharide-23 01/12/2014   Td 05/14/2016   Tdap 02/26/2023   Zoster, Live 01/29/2014       History Patient Active Problem List   Diagnosis Date Noted   Chest wall mass 12/04/2022   History of sleep apnea 12/01/2022   Nocturnal epilepsy (HCC) 12/01/2022   Reactive airways dysfunction syndrome (HCC) 12/01/2022   Family history of early CAD 05/05/2021   Mild cognitive impairment 06/27/2020   Chronic sinus bradycardia 07/09/2018   Abnormal bowel movement 07/09/2018   Hyperinflation of lungs 07/09/2018    Non-seasonal allergic rhinitis 07/09/2018   Abnormal finding on MRI of brain 12/04/2016   Benign prostatic hyperplasia with incomplete bladder emptying 11/11/2016   Mixed hyperlipidemia 05/13/2016   Esophageal reflux 03/28/2015   Shortness of breath 03/28/2015   Skin lesion of face 03/28/2015   AVM (arteriovenous malformation) brain 06/15/2013   Seizure disorder (HCC) 01/19/2013   Central sleep apnea 06/10/2012   Epigastric pain 06/06/2012   Gallstones 06/06/2012   Nausea 06/06/2012   Past Medical History:  Diagnosis Date   Allergy    Arthritis    Asthma    childhood   AVM (arteriovenous malformation) brain 06/15/2013   Balanitis    Benign prostatic hypertrophy    Central sleep apnea    Cholecystitis with cholelithiasis    Chronic sinus bradycardia 07/09/2018   Convulsive disorder (HCC)    Esophageal reflux 03/28/2015   History of nuclear stress test    Myoview 11/16: EF 60%, normal perfusion, Low Risk   Hyperlipidemia    Prostatitis    Seizures (HCC)    Past Surgical History:  Procedure Laterality Date   ELBOW SURGERY  11/11/2000   repair   FRACTURE SURGERY     LEFT ELBOW   TONSILLECTOMY     Allergies  Allergen Reactions   Penicillins Rash    Did it involve swelling of the face/tongue/throat, SOB, or low BP? No Did it involve sudden or severe rash/hives, skin peeling, or any reaction on the inside of your mouth or nose? Yes Did you need to seek medical attention at a hospital or doctor's office? No When did it last happen?  Decades ago. If all above answers are "NO", may proceed with cephalosporin use.    Prior to Admission medications   Medication Sig Start Date End Date Taking? Authorizing Provider  albuterol (PROVENTIL HFA;VENTOLIN HFA) 108 (90 Base) MCG/ACT inhaler Inhale 2 puffs into the lungs every 4 (four) hours as needed for wheezing or shortness of breath (cough, shortness of breath or wheezing.). 06/01/17  Yes Sherren Mocha, MD  aspirin EC 81 MG tablet  Take 81 mg by mouth daily.   Yes [provider]  Creatine Monohydrate POWD Take 5 g by mouth daily. 01/12/23  Yes [provider]  doxazosin (CARDURA) 2 MG tablet Take 1 tablet (2 mg total) by mouth daily. 01/28/23  Yes Shade Flood, MD  ezetimibe (ZETIA) 10 MG tablet Take 1 tablet (10 mg total) by mouth daily. 08/04/22  Yes Jake Bathe, MD  finasteride (PROSCAR) 5 MG tablet Take 1 tablet (5 mg total) by mouth daily. 01/28/23  Yes Shade Flood, MD  fluticasone [facility]) 50 MCG/ACT nasal spray USE 1 SPRAY IN EACH NOSTRIL TWICE A DAY. 03/17/22  Yes Shade Flood, MD  LamoTRIgine 200 MG TB24 24 hour tablet Take 1 tablet (200 mg total) by mouth at bedtime. 05/12/23  Yes Glean Salvo, NP  MAGNESIUM CHLORIDE-CALCIUM PO Take 300 mg by mouth daily.   Yes [provider]  Melatonin 1 MG/ML LIQD Take 2 mg by mouth.   Yes [provider]  montelukast (SINGULAIR) 10 MG tablet TAKE ONE TABLET AT BEDTIME. 07/02/22  Yes Shade Flood, MD  Multiple Vitamins-Minerals (MULTIVITAMIN WITH MINERALS) tablet Take 1 tablet by mouth daily.   Yes [provider]  omeprazole (PRILOSEC) 20 MG capsule Take 1 capsule (20 mg total) by mouth daily. 01/22/22  Yes Shade Flood, MD  rosuvastatin (CRESTOR) 20 MG tablet Take 1 tablet (20 mg total) by mouth daily. 08/04/22  Yes Jake Bathe, MD  tamsulosin (FLOMAX) 0.4 MG CAPS capsule Take 0.4 mg by mouth daily. 11/28/22  Yes [provider]   Social History   Socioeconomic History   Marital status: Married    Spouse name: Natasha Mead   Number of children: Not on file   Years of education: Not on file   Highest education level: Bachelor's degree (e.g., BA, AB, BS)  Occupational History   Occupation: retired    Associate Professor: RETIRED  Tobacco Use   Smoking status: Former    Current packs/day: 0.00    Average packs/day: 1 pack/day for 10.0 years (10.0 ttl pk-yrs)    Types: Cigarettes    Start date: 07/14/1967     Quit date: 07/13/1977    Years since quitting: 45.8   Smokeless tobacco: Never  Vaping Use   Vaping status: Never Used  Substance and Sexual Activity   Alcohol use: Yes    Alcohol/week: 2.0 standard drinks of alcohol    Types: 2 Cans of beer per week    Comment: occ   Drug use: No   Sexual activity: Not Currently    Birth control/protection: Post-menopausal, Abstinence  Other Topics Concern   Not on file  Social History Narrative   Patient worked in Engineer, materials.   Social Determinants of Health   Financial Resource Strain: Low Risk  (04/30/2023)   Overall Financial Resource Strain (CARDIA)    Difficulty of Paying Living Expenses: Not hard at all  Food Insecurity: No Food Insecurity (04/30/2023)   Hunger Vital Sign    Worried About  Running Out of Food in the Last Year: Never true    Ran Out of Food in the Last Year: Never true  Transportation Needs: No Transportation Needs (04/30/2023)   PRAPARE - Administrator, Civil Service (Medical): No    Lack of Transportation (Non-Medical): No  Physical Activity: Sufficiently Active (04/30/2023)   Exercise Vital Sign    Days of Exercise per Week: 4 days    Minutes of Exercise per Session: 60 min  Stress: No Stress Concern Present (10/21/2022)   [employer] of Occupational Health - Occupational Stress Questionnaire    Feeling of Stress : Not at all  Social Connections: Socially Integrated (04/30/2023)   Social Connection and Isolation Panel [NHANES]    Frequency of Communication with Friends and Family: Twice a week    Frequency of Social Gatherings with Friends and Family: Once a week    Attends Religious Services: 1 to 4 times per year    Active Member of [employer] or Organizations: No    Attends Engineer, structural: More than 4 times per year    Marital Status: Married  Catering manager Violence: Not At Risk (10/21/2022)   Humiliation, Afraid, Rape, and Kick questionnaire    Fear of Current or  Ex-Partner: No    Emotionally Abused: No    Physically Abused: No    Sexually Abused: No    Review of Systems Per HPI  Objective:   Vitals:   05/13/23 1414  BP: 138/76  Pulse: (!) 56  Temp: 97.9 F (36.6 C)  TempSrc: Temporal  SpO2: 99%  Weight: 154 lb 6.4 oz (70 kg)  Height: 5\' 9"  (1.753 m)     Physical Exam Constitutional:      General: He is not in acute distress.    Appearance: Normal appearance. He is well-developed.  HENT:     Head: Normocephalic and atraumatic.  Cardiovascular:     Rate and Rhythm: Normal rate.  Pulmonary:     Effort: Pulmonary effort is normal.  Abdominal:     General: There is no distension.     Palpations: There is no mass.     Tenderness: There is no abdominal tenderness. There is no guarding.  Neurological:     Mental Status: He is alert and oriented to person, place, and time.  Psychiatric:        Mood and Affect: Mood normal.        Assessment & Plan:  Rayven Ugolini is a 77 y.o. male . Constipation, unspecified constipation type  -Improved, continue fiber supplement during the day with MiraLAX as needed.  RTC precautions.  No concerning findings on exam or previous history.  CBC and TSH were reassuring.  51-month follow-up for chronic medication recheck  Need for pneumococcal vaccination - Plan: Pneumococcal conjugate vaccine 20-valent (Prevnar 20) Prevnar 20 ordered.  Discussed RSV and shingles vaccines through his pharmacy.  No orders of the defined types were placed in this encounter.  Patient Instructions  Glad to hear the constipation has improved.  Continue Metamucil or other fiber supplement daily.  MiraLAX if needed.  Follow-up if the symptoms return.  Shingles vaccine and RSV vaccine options at your pharmacy.  Pneumonia vaccine updated today.  Let me know if there are questions.  There is a recommendation for RSV vaccine for patients over age 53.   Typically would recommend the RSV vaccine as most important for  patients over age 10 that have comorbidities that put them at increased  risk for severe disease (heart disease such as congestive heart failure, coronary artery disease, lung disease such as asthma or COPD, kidney disease, liver disease, diabetes, chronic or progressive neurologic or muscular conditions, immunosuppressed, or being frail or of advanced age).  For others that do not have these risk factors, there still is some benefit from vaccination since age is one of the main risk factors for developing severe disease however baseline risk of developing severe disease and requiring hospitalization is likely to be lower compared to those that have comorbidities in addition to age.   CDC does have some information as well: ToyProtection.fi     Signed,   Meredith Staggers, MD [facility], [facility] 05/13/23 2:51 PM

## 2023-05-15 LAB — ATN PROFILE
A -- Beta-amyloid 42/40 Ratio: 0.084 — ABNORMAL LOW (ref >0.102)
Beta-amyloid 40: 167.21 pg/mL
Beta-amyloid 42: 14.04 pg/mL
N -- NfL, Plasma: 3.62 pg/mL (ref 0.00–7.64)
T -- p-tau181: 1.56 pg/mL — ABNORMAL HIGH (ref 0.00–0.97)

## 2023-05-17 ENCOUNTER — Encounter: Payer: Self-pay | Admitting: Neurology

## 2023-05-17 DIAGNOSIS — G309 Alzheimer's disease, unspecified: Secondary | ICD-10-CM

## 2023-05-25 NOTE — Addendum Note (Signed)
Addended by: Glean Salvo on: 05/25/2023 04:24 PM   Modules accepted: Orders

## 2023-05-26 ENCOUNTER — Telehealth: Payer: Self-pay | Admitting: Neurology

## 2023-05-26 NOTE — Telephone Encounter (Signed)
no auth required sent to [facility] [phone]

## 2023-05-30 ENCOUNTER — Emergency Department (HOSPITAL_COMMUNITY): Payer: Medicare Other

## 2023-05-30 ENCOUNTER — Encounter (HOSPITAL_COMMUNITY): Payer: Self-pay

## 2023-05-30 ENCOUNTER — Other Ambulatory Visit: Payer: Self-pay

## 2023-05-30 ENCOUNTER — Emergency Department (HOSPITAL_COMMUNITY)
Admission: EM | Admit: 2023-05-30 | Discharge: 2023-05-30 | Disposition: A | Discharge status: Home / Self Care | Payer: Medicare Other | Attending: Emergency Medicine | Admitting: Emergency Medicine

## 2023-05-30 DIAGNOSIS — R0789 Other chest pain: Secondary | ICD-10-CM | POA: Diagnosis not present

## 2023-05-30 DIAGNOSIS — Z7982 Long term (current) use of aspirin: Secondary | ICD-10-CM | POA: Insufficient documentation

## 2023-05-30 DIAGNOSIS — R079 Chest pain, unspecified: Secondary | ICD-10-CM | POA: Diagnosis not present

## 2023-05-30 DIAGNOSIS — R001 Bradycardia, unspecified: Secondary | ICD-10-CM | POA: Diagnosis not present

## 2023-05-30 LAB — TROPONIN I (HIGH SENSITIVITY)
Troponin I (High Sensitivity): 4 ng/L (ref <18)
Troponin I (High Sensitivity): 5 ng/L (ref <18)

## 2023-05-30 LAB — COMPREHENSIVE METABOLIC PANEL
ALT: 14 U/L (ref 0–44)
AST: 39 U/L (ref 15–41)
Albumin: 3.6 g/dL (ref 3.5–5.0)
Alkaline Phosphatase: 46 U/L (ref 38–126)
Anion gap: 7 (ref 5–15)
BUN: 16 mg/dL (ref 8–23)
CO2: 22 mmol/L (ref 22–32)
Calcium: 8.6 mg/dL — ABNORMAL LOW (ref 8.9–10.3)
Chloride: 106 mmol/L (ref 98–111)
Creatinine, Ser: 1.12 mg/dL (ref 0.61–1.24)
GFR, Estimated: >60 mL/min (ref >60)
Glucose, Bld: 88 mg/dL (ref 70–99)
Potassium: 4.3 mmol/L (ref 3.5–5.1)
Sodium: 135 mmol/L (ref 135–145)
Total Bilirubin: 1 mg/dL (ref <1.2)
Total Protein: 5.9 g/dL — ABNORMAL LOW (ref 6.5–8.1)

## 2023-05-30 LAB — CBC WITH DIFFERENTIAL/PLATELET
Abs Immature Granulocytes: 0.01 10*3/uL (ref 0.00–0.07)
Basophils Absolute: 0.1 10*3/uL (ref 0.0–0.1)
Basophils Relative: 1 %
Eosinophils Absolute: 0.2 10*3/uL (ref 0.0–0.5)
Eosinophils Relative: 3 %
HCT: 41.7 % (ref 39.0–52.0)
Hemoglobin: 13.5 g/dL (ref 13.0–17.0)
Immature Granulocytes: 0 %
Lymphocytes Relative: 52 %
Lymphs Abs: 4.1 10*3/uL — ABNORMAL HIGH (ref 0.7–4.0)
MCH: 30.7 pg (ref 26.0–34.0)
MCHC: 32.4 g/dL (ref 30.0–36.0)
MCV: 94.8 fL (ref 80.0–100.0)
Monocytes Absolute: 0.7 10*3/uL (ref 0.1–1.0)
Monocytes Relative: 9 %
Neutro Abs: 2.7 10*3/uL (ref 1.7–7.7)
Neutrophils Relative %: 35 %
Platelets: 197 10*3/uL (ref 150–400)
RBC: 4.4 MIL/uL (ref 4.22–5.81)
RDW: 12.8 % (ref 11.5–15.5)
WBC: 7.8 10*3/uL (ref 4.0–10.5)
nRBC: 0 % (ref 0.0–0.2)

## 2023-05-30 LAB — LIPASE, BLOOD: Lipase: 35 U/L (ref 11–51)

## 2023-05-30 LAB — D-DIMER, QUANTITATIVE: D-Dimer, Quant: <0.27 ug{FEU}/mL (ref 0.00–0.50)

## 2023-05-30 MED ORDER — ASPIRIN 81 MG PO CHEW
324.0000 mg | CHEWABLE_TABLET | Freq: Once | ORAL | Status: AC
  Administered 2023-05-30: 324 mg via ORAL
  Filled 2023-05-30: qty 4

## 2023-05-30 NOTE — ED Notes (Addendum)
5:23 AM  Report given from previous RN. This RN assumes care of the patient.   6:39 AM  Discharge instructions discussed with patient and wife. Patient and wife voice understanding of discharge instructions and denies any questions or concerns regarding discharge. Patient is stable at discharge. Patient in NAD. Referral and follow up information discussed with patient and patient able to teach back information. Patient has steady and equal gait. Declines wheelchair. Patient's wife to transport patient home.

## 2023-05-30 NOTE — ED Provider Notes (Signed)
[place] EMERGENCY DEPARTMENT AT [facility] Provider Note   CSN: [id number] Arrival date & time: 05/30/23  0244     History  Chief Complaint  Patient presents with   Chest Pain    Anthony Juarez is a 77 y.o. male.  Patient with a history of sleep apnea on CPAP, seizure disorder, hyperlipidemia, esophageal reflux, BPH, known chest wall mass being followed presents with episode of chest pain that woke him from sleep about 1:45 AM.  States he went to bed feeling well and he believes he was startled awake by noise and noticed he was having discomfort to the left side of his chest and ribs.  This is a sharp stabbing pain has been constant for about the past hour and a half.  In addition to this pain he is having "spasms" that come and go lasting for few seconds at a time occurring about every minute in the same area of pain.  There is no radiation of the pain to his arm, neck or back.  No associated shortness of breath, cough, fever, nausea, vomiting, diaphoresis.  He has never had this kind of pain in the past.  Did not try any medication for it at home.  Is somewhat worse with palpation and movement.  Wife wonders if he is anxious about a scheduled trip tomorrow. He reports having a negative stress test earlier this year.  He does not believe this is related to his chest wall mass which is on the opposite side of his chest.  He follows with Dr. Anne Fu of cardiology and was told he had a reassuring stress test in previous workup. This pain has been constant since about 1:45 AM but the spasms come and go lasting for a few seconds at a time.  The history is provided by the patient.  Chest Pain Associated symptoms: no abdominal pain, no fatigue, no fever, no headache, no nausea, no shortness of breath and no vomiting        Home Medications Prior to Admission medications   Medication Sig Start Date End Date Taking? Authorizing Provider  albuterol (PROVENTIL HFA;VENTOLIN HFA)  108 (90 Base) MCG/ACT inhaler Inhale 2 puffs into the lungs every 4 (four) hours as needed for wheezing or shortness of breath (cough, shortness of breath or wheezing.). 06/01/17   Sherren Mocha, MD  aspirin EC 81 MG tablet Take 81 mg by mouth daily.    [provider]  Creatine Monohydrate POWD Take 5 g by mouth daily. 01/12/23   [provider]  doxazosin (CARDURA) 2 MG tablet Take 1 tablet (2 mg total) by mouth daily. 01/28/23   Shade Flood, MD  ezetimibe (ZETIA) 10 MG tablet Take 1 tablet (10 mg total) by mouth daily. 08/04/22   Jake Bathe, MD  finasteride (PROSCAR) 5 MG tablet Take 1 tablet (5 mg total) by mouth daily. 01/28/23   Shade Flood, MD  fluticasone [facility]) 50 MCG/ACT nasal spray USE 1 SPRAY IN EACH NOSTRIL TWICE A DAY. 03/17/22   Shade Flood, MD  LamoTRIgine 200 MG TB24 24 hour tablet Take 1 tablet (200 mg total) by mouth at bedtime. 05/12/23   Glean Salvo, NP  MAGNESIUM CHLORIDE-CALCIUM PO Take 300 mg by mouth daily.    [provider]  Melatonin 1 MG/ML LIQD Take 2 mg by mouth.    [provider]  montelukast (SINGULAIR) 10 MG tablet TAKE ONE TABLET AT BEDTIME. 07/02/22   Shade Flood,  MD  Multiple Vitamins-Minerals (MULTIVITAMIN WITH MINERALS) tablet Take 1 tablet by mouth daily.    [provider]  omeprazole (PRILOSEC) 20 MG capsule Take 1 capsule (20 mg total) by mouth daily. 01/22/22   Shade Flood, MD  rosuvastatin (CRESTOR) 20 MG tablet Take 1 tablet (20 mg total) by mouth daily. 08/04/22   Jake Bathe, MD  tamsulosin (FLOMAX) 0.4 MG CAPS capsule Take 0.4 mg by mouth daily. 11/28/22   [provider]      Allergies    Penicillins    Review of Systems   Review of Systems  Constitutional:  Negative for activity change, appetite change, fatigue and fever.  HENT:  Negative for congestion and rhinorrhea.   Respiratory:  Positive for chest tightness. Negative for shortness of breath.    Cardiovascular:  Positive for chest pain.  Gastrointestinal:  Negative for abdominal pain, nausea and vomiting.  Genitourinary:  Negative for dysuria, flank pain and hematuria.  Musculoskeletal:  Negative for arthralgias and myalgias.  Skin:  Negative for rash.  Neurological:  Negative for headaches.   all other systems are negative except as noted in the HPI and PMH.    Physical Exam Updated Vital Signs BP (!) 154/82 (BP Location: Right Arm)   Pulse 94   Temp 97.7 F (36.5 C)   Resp 18   Ht 5\' 9"  (1.753 m)   Wt 68.9 kg   SpO2 99%   BMI 22.45 kg/m  Physical Exam Vitals and nursing note reviewed.  Constitutional:      General: He is not in acute distress.    Appearance: He is well-developed.  HENT:     Head: Normocephalic and atraumatic.     Mouth/Throat:     Pharynx: No oropharyngeal exudate.  Eyes:     Conjunctiva/sclera: Conjunctivae normal.     Pupils: Pupils are equal, round, and reactive to light.  Neck:     Comments: No meningismus. Cardiovascular:     Rate and Rhythm: Normal rate and regular rhythm.     Heart sounds: Normal heart sounds. No murmur heard. Pulmonary:     Effort: Pulmonary effort is normal. No respiratory distress.     Breath sounds: Normal breath sounds.     Comments: No rash, pain to palpation Chest:     Chest wall: Tenderness present.  Abdominal:     Palpations: Abdomen is soft.     Tenderness: There is no abdominal tenderness. There is no guarding or rebound.  Musculoskeletal:        General: No tenderness. Normal range of motion.     Cervical back: Normal range of motion and neck supple.  Skin:    General: Skin is warm.  Neurological:     General: No focal deficit present.     Mental Status: He is alert and oriented to person, place, and time. Mental status is at baseline.     Cranial Nerves: No cranial nerve deficit.     Motor: No abnormal muscle tone.     Coordination: Coordination normal.     Comments:  5/5 strength throughout.  CN 2-12 intact.Equal grip strength.   Psychiatric:        Behavior: Behavior normal.     ED Results / Procedures / Treatments   Labs (all labs ordered are listed, but only abnormal results are displayed) Labs Reviewed  CBC WITH DIFFERENTIAL/PLATELET - Abnormal; Notable for the following components:      Result Value   Lymphs Abs 4.1 (*)  All other components within normal limits  COMPREHENSIVE METABOLIC PANEL - Abnormal; Notable for the following components:   Calcium 8.6 (*)    Total Protein 5.9 (*)    All other components within normal limits  LIPASE, BLOOD  D-DIMER, QUANTITATIVE (NOT AT [facility])  TROPONIN I (HIGH SENSITIVITY)  TROPONIN I (HIGH SENSITIVITY)    EKG EKG Interpretation Date/Time:  Sunday May 30 2023 02:53:09 EST Ventricular Rate:  59 PR Interval:  177 QRS Duration:  96 QT Interval:  419 QTC Calculation: 363 R Axis:   87  Text Interpretation: Sinus bradycardia Atrial premature complexes in couplets Low voltage with right axis deviation ST elevation, consider anterior injury No significant change was found Confirmed by Glynn Octave [phone]) on 05/30/2023 2:56:16 AM  Radiology DG Chest 2 View  Result Date: 05/30/2023 CLINICAL DATA:  Chest pain EXAM: CHEST - 2 VIEW COMPARISON:  07/22/2022 FINDINGS: Heart and mediastinal contours are within normal limits. No focal opacities or effusions. No acute bony abnormality. Rounded density projecting over the right lower lung felt to reflect nipple shadow. IMPRESSION: No active cardiopulmonary disease. Electronically Signed   By: Charlett Nose M.D.   On: 05/30/2023 03:37    Procedures Procedures    Medications Ordered in ED Medications  aspirin chewable tablet 324 mg (has no administration in time range)    ED Course/ Medical Decision Making/ A&P                                 Medical Decision Making Amount and/or Complexity of Data Reviewed Independent Historian: spouse Labs: ordered. Decision-making  details documented in ED Course. Radiology: ordered and independent interpretation performed. Decision-making details documented in ED Course. ECG/medicine tests: ordered and independent interpretation performed. Decision-making details documented in ED Course.  Risk OTC drugs.  Patient awoken from sleep with left-sided chest discomfort as well as "spasms".  EKG shows sinus bradycardia without acute ST elevations.  Pain is somewhat reproducible to palpation.  No rash to suggest herpes zoster.  Records were reviewed.  He did have PET CT cardiac In January which showed no evidence of ischemia but did show coronary calcifications  Chest x-ray is negative for infiltrate or pneumonia.  Troponin negative.  D-dimer negative.  Chest pain seems atypical for ACS. Patient has improved pain after medication in the ED.  Remains in sinus bradycardia which appears to be a chronic issue for him.  Denies dizziness or lightheadedness.  Troponin Negative x 2.  Low suspicion for ACS.  D-dimer negative with low suspicion for PE or aortic dissection.  Pain is improved.  Patient seems stable for follow-up with his PCP as well as cardiologist.  Return to the ED with exertional chest pain, pain associate with shortness of breath, nausea, vomiting, sweating or other concerns.        Final Clinical Impression(s) / ED Diagnoses Final diagnoses:  Atypical chest pain  Sinus bradycardia    Rx / DC Orders ED Discharge Orders     None         Chaslyn Eisen, Jeannett Senior, MD 05/30/23 [phone]

## 2023-05-30 NOTE — Discharge Instructions (Addendum)
Your testing is negative for heart attack or blood clot in the lung.  Follow-up with your primary doctor as well as cardiologist.  Return to the ED with exertional chest pain, pain associate with shortness of breath, nausea, vomiting, sweating or any other concerns.

## 2023-05-30 NOTE — ED Triage Notes (Signed)
Says he woke up this morning with pain and spasms along the left ribcage and chest area.   Recently began CPAP at home.   Says at baseline he is sinus brady and routinely sees his cardiologist.   Mass on right chest being watched. Most recent MRI was in February for that. Says that this does not feel related to that.

## 2023-06-15 ENCOUNTER — Encounter (HOSPITAL_COMMUNITY)
Admission: RE | Admit: 2023-06-15 | Discharge: 2023-06-15 | Disposition: A | Discharge status: Home / Self Care | Payer: Medicare Other | Source: Ambulatory Visit | Attending: Neurology | Admitting: Neurology

## 2023-06-15 DIAGNOSIS — G309 Alzheimer's disease, unspecified: Secondary | ICD-10-CM | POA: Diagnosis not present

## 2023-06-15 DIAGNOSIS — G9389 Other specified disorders of brain: Secondary | ICD-10-CM | POA: Diagnosis not present

## 2023-06-15 MED ORDER — FLORBETAPIR F 18 500-1900 MBQ/ML IV SOLN
10.0000 | Freq: Once | INTRAVENOUS | Status: AC
  Administered 2023-06-15: 10.72 via INTRAVENOUS

## 2023-06-17 NOTE — Telephone Encounter (Signed)
Contacted [facility]; they had not received the referral for neuropsychology.Phone: [phone],  Fax to [phone]

## 2023-06-23 ENCOUNTER — Other Ambulatory Visit: Payer: Self-pay | Admitting: Thoracic Surgery (Cardiothoracic Vascular Surgery)

## 2023-06-23 ENCOUNTER — Telehealth: Payer: Self-pay | Admitting: Neurology

## 2023-06-23 DIAGNOSIS — R222 Localized swelling, mass and lump, trunk: Secondary | ICD-10-CM

## 2023-06-23 NOTE — Telephone Encounter (Signed)
[facility] Marylene Land) patient appointments scheduled; testing: 09/06/2023, Feedback appointment: 09/22/23

## 2023-06-23 NOTE — Telephone Encounter (Signed)
I called the patient, we reviewed positive amyloid PET scan.  No changes to memory.  He is scheduled for neuropsychological evaluation with Dr. Rueben Bash in February.  We discussed we can offer Aricept and Namenda, even Leqembi.  He is going to do some research and hold off for now.  Reach out to me with any questions.  He is scheduled to see Dr. Vickey Huger in January for CPAP follow-up.  IMPRESSION: Positive scan for brain amyloid is most consistent with the presence of moderate to frequent neuritic beta-amyloid plaques in the brain. A positive scan demonstrates the presence of AD pathology.

## 2023-07-02 ENCOUNTER — Ambulatory Visit: Payer: Medicare Other | Admitting: Thoracic Surgery (Cardiothoracic Vascular Surgery)

## 2023-07-08 ENCOUNTER — Other Ambulatory Visit: Payer: Self-pay | Admitting: Family Medicine

## 2023-07-20 ENCOUNTER — Ambulatory Visit
Admission: RE | Admit: 2023-07-20 | Discharge: 2023-07-20 | Disposition: A | Discharge status: Home / Self Care | Payer: Medicare Other | Source: Ambulatory Visit | Attending: Thoracic Surgery (Cardiothoracic Vascular Surgery)

## 2023-07-20 DIAGNOSIS — R222 Localized swelling, mass and lump, trunk: Secondary | ICD-10-CM | POA: Diagnosis not present

## 2023-07-21 ENCOUNTER — Ambulatory Visit (INDEPENDENT_AMBULATORY_CARE_PROVIDER_SITE_OTHER): Payer: Medicare Other | Admitting: Neurology

## 2023-07-21 ENCOUNTER — Encounter: Payer: Self-pay | Admitting: Neurology

## 2023-07-21 VITALS — BP 141/74 | HR 58 | Ht 69.0 in | Wt 159.4 lb

## 2023-07-21 DIAGNOSIS — G40909 Epilepsy, unspecified, not intractable, without status epilepticus: Secondary | ICD-10-CM

## 2023-07-21 DIAGNOSIS — G4731 Primary central sleep apnea: Secondary | ICD-10-CM | POA: Diagnosis not present

## 2023-07-21 DIAGNOSIS — Q282 Arteriovenous malformation of cerebral vessels: Secondary | ICD-10-CM

## 2023-07-21 DIAGNOSIS — G309 Alzheimer's disease, unspecified: Secondary | ICD-10-CM

## 2023-07-21 NOTE — Progress Notes (Signed)
 Provider:  Dedra Gores, MD  Primary Care Physician:  Levora Reyes SAUNDERS, MD [address] [place] [address]     Referring Provider: Levora Reyes SAUNDERS, Md [address] [place],  [place] [address]          Chief Complaint according to patient   Patient presents with:     New Patient (Initial Visit)           HISTORY OF PRESENT ILLNESS:  Anthony Juarez is a 78 y.o. male patient who is here for revisit 07/21/2023 for  his first visit on CPAP. He has slowly gotten adjusted to using it and he sleeps well now with CPAP>  He considers himself a habitual mouth breather.  Has a deviated septum. Treated for GERD, mentioned mouth tape as additional therapy while on CPAP. Nasal pillow.  The patient is followed for nocturnal epilepsy by Dr. Onita and nurse practitioner Lauraine Born. He is on lamotrigine  and well-controlled. He is also using a Ventolin  inhaler as needed for wheezing and this is not frequently implemented. He is on Cardura , he is on Proscar , Flonase , Singulair , Prilosec and Crestor . None of these medications are suppressing the respiratory drive. He is also not at all obese and he does not have the anatomical markers of a high risk for sleep apnea patient. So I think we should have this patient return for a sleep study just to see if obstructive sleep apnea is present given that he does snore.     PSG titration- 04-06-2023. This 78 year-old Male reports restorative sleep but was concerned about sleep apnea still being present. He has nocturnal seizures. HST confirmed 25% central apneas. . The Epworth Sleepiness Scale was 3 out of 24 (scores above or equal to 10 are suggestive of hypersomnolence).  [employer] EVORA FFM, size S/M and CPAP was started at 6 cm water. increased to a final 10 cm pressure with 2 cm H20 EPR.  Based on the baseline AHI as seen in the HST , this CPAP titration did lead to a reduction of AHI . The final setting of 10 cm water  eliminated all apnea events. The patient slept under the setting of 10 cm water,2 cm EPR, for 147 minutes and without any further apnoeic events.  2.  Bradycardia was present.       RECOMMENDATIONS: [employer]  EVORA FFM, size S/M  CPAP was started at 6 cm water. increased to a final 10 cm pressure with 2 cm H20 EPR. this setting will be ordered for the patient at home.      Review of Systems: Out of a complete 14 system review, the patient complains of only the following symptoms, and all other reviewed systems are negative.:  Fatigue, sleepiness , snoring, fragmented sleep, Insomnia, RLS, Nocturia    How likely are you to doze in the following situations: 0 = not likely, 1 = slight chance, 2 = moderate chance, 3 = high chance   Sitting and Reading? Watching Television? Sitting inactive in a public place (theater or meeting)? As a passenger in a car for an hour without a break? Lying down in the afternoon when circumstances permit? Sitting and talking to someone? Sitting quietly after lunch without alcohol? In a car, while stopped for a few minutes in traffic?   Total = 2/ 24 points   FSS endorsed at 24/ 63 points.  GDS : 3/  15   Social History   Socioeconomic History   Marital status: Married    Spouse name: [place]onia   Number of children: Not on file   Years of education: Not on file   Highest education level: Bachelor's degree (e.g., BA, AB, BS)  Occupational History   Occupation: retired    Associate Professor: RETIRED  Tobacco Use   Smoking status: Former    Current packs/day: 0.00    Average packs/day: 1 pack/day for 10.0 years (10.0 ttl pk-yrs)    Types: Cigarettes    Start date: 07/14/1967    Quit date: 07/13/1977    Years since quitting: 46.0   Smokeless tobacco: Never  Vaping Use   Vaping status: Never Used  Substance and Sexual Activity   Alcohol use: Yes    Alcohol/week: 2.0 standard drinks of alcohol    Types: 2 Cans of beer per week    Comment: occ   Drug  use: No   Sexual activity: Not Currently    Birth control/protection: Post-menopausal, Abstinence  Other Topics Concern   Not on file  Social History Narrative   Patient worked in engineer, materials.   Social Drivers of Corporate Investment Banker Strain: Low Risk  (04/30/2023)   Overall Financial Resource Strain (CARDIA)    Difficulty of Paying Living Expenses: Not hard at all  Food Insecurity: No Food Insecurity (04/30/2023)   Hunger Vital Sign    Worried About Running Out of Food in the Last Year: Never true    Ran Out of Food in the Last Year: Never true  Transportation Needs: No Transportation Needs (04/30/2023)   PRAPARE - Administrator, Civil Service (Medical): No    Lack of Transportation (Non-Medical): No  Physical Activity: Sufficiently Active (04/30/2023)   Exercise Vital Sign    Days of Exercise per Week: 4 days    Minutes of Exercise per Session: 60 min  Stress: No Stress Concern Present (10/21/2022)   [employer] of Occupational Health - Occupational Stress Questionnaire    Feeling of Stress : Not at all  Social Connections: Socially Integrated (04/30/2023)   Social Connection and Isolation Panel [NHANES]    Frequency of Communication with Friends and Family: Twice a week    Frequency of Social Gatherings with Friends and Family: Once a week    Attends Religious Services: 1 to 4 times per year    Active Member of [employer] or Organizations: No    Attends Engineer, Structural: More than 4 times per year    Marital Status: Married    Family History  Problem Relation Age of Onset   Heart disease Father        cabg 32's   Cancer Father        Prostate   Heart disease Maternal Grandfather        Late 60's   Dementia Mother     Past Medical History:  Diagnosis Date   Allergy    Arthritis    Asthma    childhood   AVM (arteriovenous malformation) brain 06/15/2013   Balanitis    Benign prostatic hypertrophy    Central sleep apnea     Cholecystitis with cholelithiasis    Chronic sinus bradycardia 07/09/2018   Convulsive disorder (HCC)    Esophageal reflux 03/28/2015   History of nuclear stress test    Myoview 11/16: EF 60%, normal perfusion, Low Risk   Hyperlipidemia    Prostatitis    Seizures (HCC)  Past Surgical History:  Procedure Laterality Date   ELBOW SURGERY  11/11/2000   repair   FRACTURE SURGERY     LEFT ELBOW   TONSILLECTOMY       Current Outpatient Medications on File Prior to Visit  Medication Sig Dispense Refill   albuterol  (PROVENTIL  HFA;VENTOLIN  HFA) 108 (90 Base) MCG/ACT inhaler Inhale 2 puffs into the lungs every 4 (four) hours as needed for wheezing or shortness of breath (cough, shortness of breath or wheezing.). 1 Inhaler 2   aspirin  EC 81 MG tablet Take 81 mg by mouth daily.     Creatine Monohydrate POWD Take 5 g by mouth daily.     doxazosin  (CARDURA ) 2 MG tablet Take 1 tablet (2 mg total) by mouth daily. 90 tablet 2   ezetimibe  (ZETIA ) 10 MG tablet Take 1 tablet (10 mg total) by mouth daily. 90 tablet 3   finasteride  (PROSCAR ) 5 MG tablet Take 1 tablet (5 mg total) by mouth daily. 90 tablet 2   fluticasone  (FLONASE ) 50 MCG/ACT nasal spray USE 1 SPRAY IN EACH NOSTRIL TWICE A DAY. 16 g 6   LamoTRIgine  200 MG TB24 24 hour tablet Take 1 tablet (200 mg total) by mouth at bedtime. 90 tablet 4   MAGNESIUM  CHLORIDE-CALCIUM  PO Take 300 mg by mouth daily.     Melatonin 1 MG/ML LIQD Take 2 mg by mouth.     montelukast  (SINGULAIR ) 10 MG tablet TAKE ONE TABLET AT BEDTIME. 90 tablet 3   Multiple Vitamins-Minerals (MULTIVITAMIN WITH MINERALS) tablet Take 1 tablet by mouth daily.     omeprazole  (PRILOSEC) 20 MG capsule Take 1 capsule (20 mg total) by mouth daily. 30 capsule 0   rosuvastatin  (CRESTOR ) 20 MG tablet Take 1 tablet (20 mg total) by mouth daily. 90 tablet 3   tamsulosin (FLOMAX) 0.4 MG CAPS capsule Take 0.4 mg by mouth daily.     No current facility-administered medications on file prior  to visit.    Allergies  Allergen Reactions   Penicillins Rash    Did it involve swelling of the face/tongue/throat, SOB, or low BP? No Did it involve sudden or severe rash/hives, skin peeling, or any reaction on the inside of your mouth or nose? Yes Did you need to seek medical attention at a hospital or doctor's office? No When did it last happen?  Decades ago. If all above answers are "NO", may proceed with cephalosporin use.      DIAGNOSTIC DATA (LABS, IMAGING, TESTING) - I reviewed patient records, labs, notes, testing and imaging myself where available.  Lab Results  Component Value Date   WBC 7.8 05/30/2023   HGB 13.5 05/30/2023   HCT 41.7 05/30/2023   MCV 94.8 05/30/2023   PLT 197 05/30/2023      Component Value Date/Time   NA 135 05/30/2023 0321   NA 141 07/11/2020 0851   K 4.3 05/30/2023 0321   CL 106 05/30/2023 0321   CO2 22 05/30/2023 0321   GLUCOSE 88 05/30/2023 0321   BUN 16 05/30/2023 0321   BUN 15 07/11/2020 0851   CREATININE 1.12 05/30/2023 0321   CREATININE 1.25 05/14/2016 0900   CALCIUM  8.6 (L) 05/30/2023 0321   PROT 5.9 (L) 05/30/2023 0321   PROT 6.0 07/11/2020 0851   ALBUMIN 3.6 05/30/2023 0321   ALBUMIN 4.2 07/11/2020 0851   AST 39 05/30/2023 0321   ALT 14 05/30/2023 0321   ALKPHOS 46 05/30/2023 0321   BILITOT 1.0 05/30/2023 0321   BILITOT 0.5 07/11/2020  9148   GFRNONAA >60 05/30/2023 0321   GFRAA 90 07/11/2020 0851   Lab Results  Component Value Date   CHOL 153 07/27/2022   HDL 74.40 07/27/2022   LDLCALC 67 07/27/2022   TRIG 60.0 07/27/2022   CHOLHDL 2 07/27/2022   Lab Results  Component Value Date   HGBA1C 5.6 07/11/2020   Lab Results  Component Value Date   VITAMINB12 516 07/16/2020   Lab Results  Component Value Date   TSH 2.88 04/30/2023    PHYSICAL EXAM:  Today's Vitals   07/21/23 0816  BP: (!) 141/74  Pulse: (!) 58  Weight: 159 lb 6.4 oz (72.3 kg)  Height: 5' 9 (1.753 m)   Body mass index is 23.54 kg/m.    Wt Readings from Last 3 Encounters:  07/21/23 159 lb 6.4 oz (72.3 kg)  05/30/23 152 lb (68.9 kg)  05/13/23 154 lb 6.4 oz (70 kg)     Ht Readings from Last 3 Encounters:  07/21/23 5' 9 (1.753 m)  05/30/23 5' 9 (1.753 m)  05/13/23 5' 9 (1.753 m)      General:  The patient is awake, alert and appears not in acute distress. The patient is well groomed. Head: Normocephalic, atraumatic. Neck is supple. Mallampati 2,  neck circumference:15 inches . Nasal airflow is barely patent.   Dental status:  biological  Cardiovascular:  Regular rate and cardiac rhythm by pulse,  without distended neck veins. Respiratory: Lungs are clear to auscultation.  Skin:  Without evidence of ankle edema, or rash. Trunk: The patient's posture is erect.   NEUROLOGIC EXAM: The patient is awake and alert, oriented to place and time.   Memory subjective described as intact.  Attention span & concentration ability appears normal.  Speech is fluent,  without  dysarthria, dysphonia or aphasia.  Mood and affect are appropriate.   Cranial nerves: no loss of smell or taste reported  Pupils are equal and briskly reactive to light. Funduscopic exam deferred. .  Extraocular movements in vertical and horizontal planes were intact and without nystagmus. No Diplopia. Visual fields by finger perimetry are intact. Hearing was intact to soft voice and finger rubbing.    Facial sensation intact to fine touch.  Facial motor strength is symmetric and tongue and uvula move midline.  Neck ROM : rotation, tilt and flexion extension were normal for age and shoulder shrug was symmetrical.    Motor exam:  Symmetric bulk, tone and ROM.   Normal tone without cog wheeling, symmetric grip strength .    ASSESSMENT AND PLAN 78 y.o. year old male  here with:  Lori clip clock at the pleasure of meeting Mr. Visente Kirker today for the first time on his newest CPAP.  He has been a longtime user and this time he is for the first  time using an auto titration device as prescribed between 6 and 12 cm water pressure with a 3 cm relief upon expiration.  Unfortunately this has created a residual of central apneas that was not seen during the last pressure explored in his in-lab titration study.  There will be a reach 10 cm water pressure without creating central apneas by there were already central apneas noted at lower pressures.  So I am going to change from auto titration settings to a setting of 10 cm water with 2 cm EPR water pressure.  The patient had been given a fullface mask that rests under the nose and he has gotten adjusted to it.  I am happy if he wants to try some mild CPAP he can actually buy this at [facility] or  I do not at this time intend to switch the patient to a BiPAP machine.  With the new settings I will have a follow-up visit with the patient in 3 to 6 months.  As long as the residual AHI is under 10 I would consider it satisfy for a patient with complex apnea.  He had about 25% central events when he was screened by home sleep test.  He had a total of 19 central apneas and no obstructive apneas during his in-lab titration.    1) set the pressure to 10 cm water, 2 cm EPR, keep the mask.  2) Rv in 4-6 months    I would like to thank  Levora Reyes SAUNDERS, Md [address] [place],  [place] [address] for allowing me to meet with and to take care of this pleasant patient.     After spending a total time of  30  minutes face to face and additional time for physical and neurologic examination, review of laboratory studies,  personal review of imaging studies, reports and results of other testing and review of referral information / records as far as provided in visit,   Electronically signed by: Dedra Gores, MD 07/21/2023 8:26 AM  [facility] Board certified by The [employer] of Sleep Medicine and Diplomate of the [employer] of Sleep Medicine. Board certified In  Neurology through the ABPN, Fellow of the [employer] of Neurology.

## 2023-07-21 NOTE — Patient Instructions (Addendum)
 Reset CPAP - we stop auto titration and set at 10 cm water 2 cm EPR.   Patient is interested in Lecanemab Injection What is this medication? LECANEMAB (lek AN e mab) treats Alzheimer disease. It works by decreasing the buildup of amyloid, a protein that may cause Alzheimer disease. This may slow down the worsening of symptoms. It is a monoclonal antibody. This medicine may be used for other purposes; ask your health care provider or pharmacist if you have questions. COMMON BRAND NAME(S): LEQEMBI What should I tell my care team before I take this medication? They need to know if you have any of these conditions: An unusual or allergic reaction to lecanemab, other medications, foods, dyes, or preservatives Take medications that treat or prevent blood clots Pregnant or trying to get pregnant Breast-feeding How should I use this medication? This medication is injected into a vein. It is given by your care team in a hospital or clinic setting. A special MedGuide will be given to you before each treatment. Be sure to read this information carefully each time. Talk to your care team about the use of this medication in children. Special care may be needed. Overdosage: If you think you have taken too much of this medicine contact a poison control center or emergency room at once. NOTE: This medicine is only for you. Do not share this medicine with others. What if I miss a dose? Keep appointments for follow-up doses. It is important not to miss your dose. Call your care team if you are unable to keep an appointment. What may interact with this medication? Interactions have not been studied. This list may not describe all possible interactions. Give your health care provider a list of all the medicines, herbs, non-prescription drugs, or dietary supplements you use. Also tell them if you smoke, drink alcohol, or use illegal drugs. Some items may interact with your medicine. What should I watch for while  using this medication? Visit your care team for regular checks on your progress. Tell your care team if your symptoms do not start to get better or if they get worse. What side effects may I notice from receiving this medication? Side effects that you should report to your care team as soon as possible: Allergic reactions or angioedema--skin rash, itching or hives, swelling of the face, eyes, lips, tongue, arms, or legs, trouble swallowing or breathing Headache, worsening confusion, dizziness, change in vision, nausea, seizures Infusion reactions--chest pain, shortness of breath or trouble breathing, feeling faint or lightheaded Side effects that usually do not require medical attention (report these to your care team if they continue or are bothersome): Cough Diarrhea Headache This list may not describe all possible side effects. Call your doctor for medical advice about side effects. You may report side effects to FDA at 1-800-FDA-1088. Where should I keep my medication? This medication is given in a hospital or clinic. It will not be stored at home. NOTE: This sheet is a summary. It may not cover all possible information. If you have questions about this medicine, talk to your doctor, pharmacist, or health care provider.  2024 Elsevier/Gold Standard (2022-01-20 00:00:00) s interested in Leqembi

## 2023-07-23 ENCOUNTER — Ambulatory Visit: Payer: Medicare Other | Admitting: Thoracic Surgery (Cardiothoracic Vascular Surgery)

## 2023-07-23 ENCOUNTER — Ambulatory Visit (INDEPENDENT_AMBULATORY_CARE_PROVIDER_SITE_OTHER): Payer: Medicare Other | Admitting: Thoracic Surgery (Cardiothoracic Vascular Surgery)

## 2023-07-23 VITALS — BP 147/86 | HR 53 | Resp 18 | Wt 159.0 lb

## 2023-07-23 DIAGNOSIS — R222 Localized swelling, mass and lump, trunk: Secondary | ICD-10-CM | POA: Diagnosis not present

## 2023-07-23 NOTE — Progress Notes (Signed)
 [address]       [place] [address]             [phone]                                                   Caelin Rayl [facility] Medical Record #[id number] Date of Birth: 19-Feb-1946   Referring: Rubin Calamity, MD Primary Care: Levora Reyes SAUNDERS, MD Primary Cardiologist: Oneil Parchment, MD   Chief Complaint:        Chief Complaint  Patient presents with   chest wall mass      New patient consult, MR chest 2/14, PET 1/30      History of Present Illness:    Anthony Juarez 78 y.o. male presents for follow-up of a chest wall mass.  The has been present for 2-3  years, and appears to be growing.  It does not cause him any pain.    It has been unchanged since his last appointment.         Past Medical History:  Diagnosis Date   Allergy     Arthritis     Asthma      childhood   AVM (arteriovenous malformation) brain 06/15/2013   Balanitis     Benign prostatic hypertrophy     Central sleep apnea     Cholecystitis with cholelithiasis     Chronic sinus bradycardia 07/09/2018   Convulsive disorder (HCC)     Esophageal reflux 03/28/2015   History of nuclear stress test      Myoview 11/16: EF 60%, normal perfusion, Low Risk   Hyperlipidemia     Prostatitis     Seizures (HCC)                 Past Surgical History:  Procedure Laterality Date   ELBOW SURGERY   11/11/2000    repair   FRACTURE SURGERY        LEFT ELBOW   TONSILLECTOMY                   Family History  Problem Relation Age of Onset   Heart disease Father          cabg 31's   Cancer Father          Prostate   Heart disease Maternal Grandfather          Late 41's   Dementia Mother              Tobacco Use History  Social History        Tobacco Use  Smoking Status Former   Packs/day: 1.00   Years: 10.00   Additional pack years: 0.00   Total pack years: 10.00   Types: Cigarettes   Quit date: 07/13/1977   Years since quitting: 45.4  Smokeless Tobacco  Never      Social History        Substance and Sexual Activity  Alcohol Use Yes   Alcohol/week: 2.0 standard drinks of alcohol   Types: 2 Cans of beer per week    Comment: 2 beers a day        Allergies       Allergies  Allergen Reactions   Penicillins Rash      Did it involve swelling of the face/tongue/throat, SOB, or  low BP? No Did it involve sudden or severe rash/hives, skin peeling, or any reaction on the inside of your mouth or nose? Yes Did you need to seek medical attention at a hospital or doctor's office? No When did it last happen?  Decades ago. If all above answers are "NO", may proceed with cephalosporin use.                Current Outpatient Medications  Medication Sig Dispense Refill   albuterol  (PROVENTIL  HFA;VENTOLIN  HFA) 108 (90 Base) MCG/ACT inhaler Inhale 2 puffs into the lungs every 4 (four) hours as needed for wheezing or shortness of breath (cough, shortness of breath or wheezing.). 1 Inhaler 2   aspirin  EC 81 MG tablet Take 81 mg by mouth daily.       doxazosin  (CARDURA ) 2 MG tablet Take 1 tablet (2 mg total) by mouth daily. 90 tablet 2   ezetimibe  (ZETIA ) 10 MG tablet Take 1 tablet (10 mg total) by mouth daily. 90 tablet 3   finasteride  (PROSCAR ) 5 MG tablet Take 1 tablet (5 mg total) by mouth daily. 90 tablet 2   fluticasone  (FLONASE ) 50 MCG/ACT nasal spray USE 1 SPRAY IN EACH NOSTRIL TWICE A DAY. 16 g 6   LamoTRIgine  200 MG TB24 24 hour tablet Take 1 tablet (200 mg total) by mouth at bedtime. 90 tablet 4   Melatonin 1 MG/ML LIQD Take 2 mg by mouth.       montelukast  (SINGULAIR ) 10 MG tablet TAKE ONE TABLET AT BEDTIME. 90 tablet 3   Multiple Vitamins-Minerals (MULTIVITAMIN WITH MINERALS) tablet Take 1 tablet by mouth daily.       omeprazole  (PRILOSEC) 20 MG capsule Take 1 capsule (20 mg total) by mouth daily. 30 capsule 0   rosuvastatin  (CRESTOR ) 20 MG tablet Take 1 tablet (20 mg total) by mouth daily. 90 tablet 3   tamsulosin (FLOMAX) 0.4 MG CAPS  capsule Take 0.4 mg by mouth daily.          No current facility-administered medications for this visit.        Review of Systems  Constitutional: Negative.   Respiratory: Negative.    Cardiovascular: Negative.   Musculoskeletal: Negative.   Neurological: Negative.       PHYSICAL EXAMINATION: Vitals:   07/23/23 1103  BP: (!) 147/86  Pulse: (!) 53  Resp: 18  SpO2: 96%      Physical Exam Constitutional:      General: He is not in acute distress.    Appearance: Normal appearance. He is not ill-appearing.  HENT:     Head: Normocephalic and atraumatic.  Eyes:     Extraocular Movements: Extraocular movements intact.  Cardiovascular:     Rate and Rhythm: Normal rate.  Pulmonary:     Effort: Pulmonary effort is normal. No respiratory distress.  Chest:     Abdominal:     General: Abdomen is flat. There is no distension.  Musculoskeletal:        General: Normal range of motion.     Cervical back: Normal range of motion and neck supple.  Skin:    General: Skin is warm and dry.  Neurological:     General: No focal deficit present.     Mental Status: He is alert and oriented to person, place, and time.        Diagnostic Studies & Laboratory data:     Recent Radiology Findings:    Imaging Results  MR BRAIN W WO CONTRAST  Result Date: 11/17/2022 Table formatting from the original result was not included. [facility] NEUROIMAGING REPORT STUDY DATE: 11/17/22 PATIENT NAME: Anthony Juarez DOB: 26-Nov-1945 MRN: [id number] ORDERING CLINICIAN: Gayland Lauraine PARAS, NP CLINICAL HISTORY: 78 y.o. year old male with: 1. Mild cognitive impairment   EXAM: MR BRAIN W WO CONTRAST TECHNIQUE: MRI of the brain with and without contrast was obtained utilizing multiplanar, multiecho pulse sequences. CONTRAST: Diagnostic Product Medications (last 72 hours)   Date/Time Action Medication Dose  11/17/22 1109 Contrast Given  gadopiclenol  (VUEWAY ) 0.5 MMOL/ML solution 7.5 mL 7.5 mL    COMPARISON: 07/29/15 MRI IMAGING SITE: [facility] [address]  [place] [address] FINDINGS: No abnormal lesions are seen on diffusion-weighted views to suggest acute ischemia. The cortical sulci, fissures and cisterns are normal in size and appearance. Lateral, third and fourth ventricle are normal in size and appearance. No extra-axial fluid collections are seen. No evidence of mass effect or midline shift.  Scattered periventricular, subcortical and juxtacortical foci of chronic small vessel ischemic disease.  No abnormal lesions are seen on post contrast views.  Stable right frontal opercular cavernous malformation measuring 10mm.  Associated developmental venous anomaly also stable. On sagittal views the posterior fossa, pituitary gland and corpus callosum are unremarkable. No evidence of intracranial hemorrhage on SWI views. The orbits and their contents, paranasal sinuses and calvarium are unremarkable.  Intracranial flow voids are present.    MRI brain (with and without) demonstrating: -Stable right frontal developmental venous anomaly and associated cavernous malformation. -Stable mild chronic small vessel ischemic disease. -No acute findings. INTERPRETING PHYSICIAN: EDUARD FABIENE HANLON, MD Certified in Neurology, Neurophysiology and Neuroimaging [facility] [address] [place], [place] [address] [phone]          I have independently reviewed the above radiology studies  and reviewed the findings with the patient.    Recent Lab Findings: Recent Labs       Lab Results  Component Value Date    WBC 8.5 07/22/2022    HGB 13.7 07/22/2022    HCT 42.1 07/22/2022    PLT 284 07/22/2022    GLUCOSE 94 07/22/2022    CHOL 153 07/27/2022    TRIG 60.0 07/27/2022    HDL 74.40 07/27/2022    LDLCALC 67 07/27/2022    ALT 20 07/27/2022    AST 33 07/27/2022    NA 135 07/22/2022    K 4.1 07/22/2022    CL  104 07/22/2022    CREATININE 1.04 10/21/2022    BUN 19 07/22/2022    CO2 24 07/22/2022    TSH 2.680 07/16/2020    HGBA1C 5.6 07/11/2020               Assessment / Plan:   78yo male with right chest wall mass.  On imaging, it appears cystic and well circumscribed.  There does not appear to be any muscular or rib involvement.  We discussed the risks and benefits of an excisional biopsy.  He would like to discuss things with his wife give us  a call to let us  know his ultimate plan.     Monice Lundy MALVA Rayas

## 2023-07-28 ENCOUNTER — Other Ambulatory Visit (INDEPENDENT_AMBULATORY_CARE_PROVIDER_SITE_OTHER): Payer: Medicare Other

## 2023-07-28 DIAGNOSIS — G309 Alzheimer's disease, unspecified: Secondary | ICD-10-CM

## 2023-07-28 DIAGNOSIS — Z0289 Encounter for other administrative examinations: Secondary | ICD-10-CM

## 2023-08-04 ENCOUNTER — Telehealth: Payer: Self-pay | Admitting: Neurology

## 2023-08-04 LAB — APOE ALZHEIMER'S RISK

## 2023-08-04 NOTE — Telephone Encounter (Signed)
Call to patient, no answer. Left message to return call.

## 2023-08-04 NOTE — Telephone Encounter (Signed)
Pt returning call. Requesting call back.  

## 2023-08-04 NOTE — Telephone Encounter (Signed)
Call to patient, reviewed results and patient verbalized understanding. In agreement for VV for discuss results and treatment options.

## 2023-08-04 NOTE — Telephone Encounter (Signed)
Please call, patient is being considered for Leqembi. The APOE testing came back positive for one copy of the APOE4 variant that is associated with increased risk of late onset Alzheimer's disease.  This testing was being done to evaluate the risk of ARIA related to Leqembi.  Please schedule him for a visit with Dr. Terrace Arabia, can be virtual to discuss Leqembi.  MRI of the brain has shown a stable right frontal venous anomaly.  Would like to have him see Dr. Terrace Arabia to discuss risk vs benefit of Leqembi. Thanks   -Positive amyloid PET scan -Positive ATN profile

## 2023-08-12 ENCOUNTER — Encounter: Payer: Self-pay | Admitting: Cardiology

## 2023-08-12 ENCOUNTER — Telehealth (INDEPENDENT_AMBULATORY_CARE_PROVIDER_SITE_OTHER): Payer: Medicare Other | Admitting: Neurology

## 2023-08-12 ENCOUNTER — Ambulatory Visit: Payer: Medicare Other | Attending: Cardiology | Admitting: Cardiology

## 2023-08-12 VITALS — BP 134/80 | HR 49 | Ht 69.0 in | Wt 160.0 lb

## 2023-08-12 DIAGNOSIS — G4731 Primary central sleep apnea: Secondary | ICD-10-CM | POA: Diagnosis not present

## 2023-08-12 DIAGNOSIS — R222 Localized swelling, mass and lump, trunk: Secondary | ICD-10-CM

## 2023-08-12 DIAGNOSIS — E782 Mixed hyperlipidemia: Secondary | ICD-10-CM

## 2023-08-12 DIAGNOSIS — G40909 Epilepsy, unspecified, not intractable, without status epilepticus: Secondary | ICD-10-CM | POA: Diagnosis not present

## 2023-08-12 DIAGNOSIS — I2510 Atherosclerotic heart disease of native coronary artery without angina pectoris: Secondary | ICD-10-CM

## 2023-08-12 DIAGNOSIS — G3184 Mild cognitive impairment, so stated: Secondary | ICD-10-CM | POA: Diagnosis not present

## 2023-08-12 DIAGNOSIS — R072 Precordial pain: Secondary | ICD-10-CM

## 2023-08-12 DIAGNOSIS — Q282 Arteriovenous malformation of cerebral vessels: Secondary | ICD-10-CM | POA: Diagnosis not present

## 2023-08-12 MED ORDER — MEMANTINE HCL 10 MG PO TABS: 10.0000 mg | ORAL_TABLET | Freq: Two times a day (BID) | ORAL | 11 refills | Status: DC

## 2023-08-12 NOTE — Progress Notes (Signed)
ASSESSMENT AND PLAN 78 y.o. year old male     Epilepsy  -No recurrent seizure  -Continue lamotrigine 200 mg ER daily  MRI of the brain showed stable right frontal venous malformation versus cavernous angioma  Mild cognitive impairment  MoCA examination 29 out of 30   Laboratory evaluation showed no treatable etiology  Alzheimer profile, beta amyloid PET scan suggestive of Alzheimer's pathology, APO E4 heterozygous,  Discussed with patient, because of his history of right frontal venous malformation, and the reported associated amyloid associated imaging abnormality including brain bleeding, he does not want to consider Leqembi or Kisunla treatment now, will start Namenda 10 mg twice a day, may add on Aricept 10 mg daily,    DIAGNOSTIC DATA (LABS, IMAGING, TESTING) - I reviewed patient records, labs, notes, testing and imaging myself where available.   HISTORY OF PRESENT ILLNESS:  Mr. Anthony Juarez is a 78 yo WM, following up for seizure disorder   He has PMHx of seizure since infant, he was treated with dilantin and phenobarbital for a long time, was eventurally tapered off after seizure free since age 71, he had one recurrent seizure at age 22, was put back on dilantin 400mg  qhs, has been on it since.   He had Bachelor's degree, retired as Building services engineer, Animal nutritionist  in 2009, moved to [place] about 2.5 years ago, now he stays at home most of time, rarely initiated activities, felt fatigue, difficulty concontrating, getting worse since summer of 2013.  His wife complains that he is not up to his house projects anymore, frequent nocturia, which has prompted recent treatment with desmopression, which helped his frequent night time awaking,  He also has sleep apnea, but could not tolerate his BiPAP machine.He is seen by Dr. Shelle Iron.   Dilantin level was 32 while he was taking Dilantin 100 mg 4 tablets each night, level decreased to 22 while he was taking Dilantin 100 mg 3  tablets each night, he has developed gingival hypertrophy, his brain fogginess has much improved with decreased dose of Dilantin, especially after he stopped taking Desopressin,    MRI scan of the brain showed a cavernous angioma with remote age hemorrhage in the right frontal subcortical region with  and adjacent  venous angioma.  There are moderate changes of chronic microvascular ischemia and mild degree of generalized cerebral atrophy.   Followup visit today patient has been switched to Keppra XR 750mg  daily. He is off Dilantin totally and his brain fogginess has improved. He has not had further seizure activity.   UPDATE Jan 19th 2015: He is now taking keppra xr 750mg  qhs, last seizure 1992, nocturnal seizure,  he only has mild fatigue, no longer having brain foggy sensation We have reviewed MRI taking January 2015, continue the right inferior frontal cavernous venous angioma, mild atrophy, moderate small vessel disease,   UPDATE Jan 19th 2016; I have reviewed MRI brain (with and without): right frontal juxtacortical T2 heterogenous lesion (1.4x1.0cm), with "popcorn" appearance, consistent with cavernous malformation. There is an associated small developmental venous anomaly.   Multiple round and ovoid, periventricular, subcortical and juxtacortical T2 hyperintense foci. These findings are non-specific and considerations include autoimmune, inflammatory, post-infectious, microvascular ischemic or migraine associated etiologies.     He is taking Keppra XR 750 mg every night, there was no recurrent seizure   UPDATE Aug 01 2015: He had sleep study recently,  No need for CPAP machine, he is taking keppra xr 750mg  qhs, no recurrent seizure.  We have reviewed MRI of the brain with and without contrast in January 2017: 1.   A focus of heterogenous signal in the superficial right frontal lobe with a hemosiderin rim and adjacent venous anomaly. This has characteristics of a cavernous malformation.    Compared to the 07/20/2013 MRI, there has been no definite change. 2.   Scattered T2/FLAIR hyperintense foci, predominantly in the subcortical deep white matter of both hemispheres. This is stable compared to the prior MRI. She is a nonspecific finding and potential etiology could be chronic microvascular ischemic change, migraine, chronic demyelination.    UPDATE Jun 21 2018: He is overall doing well, taking Keppra 750 mg tablets half tablets twice a day, does complain some moodiness, discussed with patient we decided to switch him to lamotrigine 200 mg every day  UPDATE Jun 27 2020: He is overall doing very well, has no recurrent seizure, tolerating lamotrigine ER 200 mg well, complains of difficulty sleeping occasionally, especially after using bathroom at nighttime, he hiked with his wife regularly,  Recent couple years, noticed mild memory loss, word finding difficulties, MoCA examination 29/30 today,  We again personally reviewed MRI of brain with without contrast in 2017: Focus of heterogeneous signal at the superficial right frontal lobe, with hemosiderin rim and adjacent venous anomaly, characteristic of a cavernous malformation,   Virtual Visit via video August 12, 2023 I discussed the limitations of evaluation and management by telemedicine and the availability of in person appointments. The patient expressed understanding and agreed to proceed  Location: Provider: [facility] office; Patient: Home  I connected with [name]  on August 12, 2023 by a video enabled telemedicine application and verified that I am speaking with the correct person using two identifiers.  UPDATED HiSTORY He continue taking lamotrigine ER 200 mg every night, no recurrent seizure  Over the past few years, he struggled with short-term memory loss, direction while driving, still active, hiking regularly, hiked to 1000 miles in 2024, volunteers at food pantry,  MoCA examination in October 2024 was  30/30  Sleep study in July 2024 showed mild to moderate obstructive sleep apnea with significant REM sleep tendency, high portion of central apnea, CPAP titration since September 2024, did help him some  MRI of the brain in May 2024 showed stable right frontal developmental venous anomaly versus venous malformation, With his concern of memory issue, He was offered amyloid PET scan June 15, 2023, positive, moderate to frequent neurotic beta amyloid plaque in the brain, suggestive of Alzheimer's pathology  ATN profile showed decreased beta amyloid 42/40 ratio, with elevated p-tau 181, also consistent with Alzheimer's pathology  ApoE genotyping showed 1 copy of APO E4 variant,/Apo E3,  Observation: Awake, alert, oriented to history taking and casual conversation, I was able to explain above findings to him, he is overall functioning well, with his presence of right frontal venous anomaly versus cavernous malformation, he is hesitated to proceed with IV Leqembi or Kisunla treatment.  REVIEW OF SYSTEMS: Out of a complete 14 system review of symptoms, the patient complains only of the following symptoms, and all other reviewed systems are negative.  Seizures  ALLERGIES: Allergies  Allergen Reactions   Penicillins Rash    Did it involve swelling of the face/tongue/throat, SOB, or low BP? No Did it involve sudden or severe rash/hives, skin peeling, or any reaction on the inside of your mouth or nose? Yes Did you need to seek medical attention at a hospital or doctor's office? No When did it  last happen?  Decades ago. If all above answers are "NO", may proceed with cephalosporin use.     HOME MEDICATIONS: Outpatient Medications Prior to Visit  Medication Sig Dispense Refill   albuterol (PROVENTIL HFA;VENTOLIN HFA) 108 (90 Base) MCG/ACT inhaler Inhale 2 puffs into the lungs every 4 (four) hours as needed for wheezing or shortness of breath (cough, shortness of breath or wheezing.). 1  Inhaler 2   aspirin EC 81 MG tablet Take 81 mg by mouth daily.     Creatine Monohydrate POWD Take 5 g by mouth daily.     doxazosin (CARDURA) 2 MG tablet Take 1 tablet (2 mg total) by mouth daily. 90 tablet 2   ezetimibe (ZETIA) 10 MG tablet Take 1 tablet (10 mg total) by mouth daily. 90 tablet 3   finasteride (PROSCAR) 5 MG tablet Take 1 tablet (5 mg total) by mouth daily. 90 tablet 2   fluticasone (FLONASE) 50 MCG/ACT nasal spray USE 1 SPRAY IN EACH NOSTRIL TWICE A DAY. 16 g 6   LamoTRIgine 200 MG TB24 24 hour tablet Take 1 tablet (200 mg total) by mouth at bedtime. 90 tablet 4   MAGNESIUM CHLORIDE-CALCIUM PO Take 300 mg by mouth daily.     Melatonin 1 MG/ML LIQD Take 2 mg by mouth.     montelukast (SINGULAIR) 10 MG tablet TAKE ONE TABLET AT BEDTIME. 90 tablet 3   Multiple Vitamins-Minerals (MULTIVITAMIN WITH MINERALS) tablet Take 1 tablet by mouth daily.     omeprazole (PRILOSEC) 20 MG capsule Take 1 capsule (20 mg total) by mouth daily. 30 capsule 0   rosuvastatin (CRESTOR) 20 MG tablet Take 1 tablet (20 mg total) by mouth daily. 90 tablet 3   tamsulosin (FLOMAX) 0.4 MG CAPS capsule Take 0.4 mg by mouth daily.     No facility-administered medications prior to visit.    PAST MEDICAL HISTORY: Past Medical History:  Diagnosis Date   Allergy    Arthritis    Asthma    childhood   AVM (arteriovenous malformation) brain 06/15/2013   Balanitis    Benign prostatic hypertrophy    Central sleep apnea    Cholecystitis with cholelithiasis    Chronic sinus bradycardia 07/09/2018   Convulsive disorder (HCC)    Esophageal reflux 03/28/2015   History of nuclear stress test    Myoview 11/16: EF 60%, normal perfusion, Low Risk   Hyperlipidemia    Prostatitis    Seizures (HCC)     PAST SURGICAL HISTORY: Past Surgical History:  Procedure Laterality Date   ELBOW SURGERY  11/11/2000   repair   FRACTURE SURGERY     LEFT ELBOW   TONSILLECTOMY      FAMILY HISTORY: Family History  Problem  Relation Age of Onset   Heart disease Father        cabg 43's   Cancer Father        Prostate   Heart disease Maternal Grandfather        Late 53's   Dementia Mother     SOCIAL HISTORY: Social History   Socioeconomic History   Marital status: Married    Spouse name: Natasha Mead   Number of children: Not on file   Years of education: Not on file   Highest education level: Bachelor's degree (e.g., BA, AB, BS)  Occupational History   Occupation: retired    Associate Professor: RETIRED  Tobacco Use   Smoking status: Former    Current packs/day: 0.00    Average packs/day: 1 pack/day  for 10.0 years (10.0 ttl pk-yrs)    Types: Cigarettes    Start date: 07/14/1967    Quit date: 07/13/1977    Years since quitting: 46.1   Smokeless tobacco: Never  Vaping Use   Vaping status: Never Used  Substance and Sexual Activity   Alcohol use: Yes    Alcohol/week: 2.0 standard drinks of alcohol    Types: 2 Cans of beer per week    Comment: occ   Drug use: No   Sexual activity: Not Currently    Birth control/protection: Post-menopausal, Abstinence  Other Topics Concern   Not on file  Social History Narrative   Patient worked in Engineer, materials.   Social Drivers of Corporate investment banker Strain: Low Risk  (04/30/2023)   Overall Financial Resource Strain (CARDIA)    Difficulty of Paying Living Expenses: Not hard at all  Food Insecurity: No Food Insecurity (04/30/2023)   Hunger Vital Sign    Worried About Running Out of Food in the Last Year: Never true    Ran Out of Food in the Last Year: Never true  Transportation Needs: No Transportation Needs (04/30/2023)   PRAPARE - Administrator, Civil Service (Medical): No    Lack of Transportation (Non-Medical): No  Physical Activity: Sufficiently Active (04/30/2023)   Exercise Vital Sign    Days of Exercise per Week: 4 days    Minutes of Exercise per Session: 60 min  Stress: No Stress Concern Present (10/21/2022)   [employer] of  Occupational Health - Occupational Stress Questionnaire    Feeling of Stress : Not at all  Social Connections: Socially Integrated (04/30/2023)   Social Connection and Isolation Panel [NHANES]    Frequency of Communication with Friends and Family: Twice a week    Frequency of Social Gatherings with Friends and Family: Once a week    Attends Religious Services: 1 to 4 times per year    Active Member of [employer] or Organizations: No    Attends Engineer, structural: More than 4 times per year    Marital Status: Married  Catering manager Violence: Not At Risk (10/21/2022)   Humiliation, Afraid, Rape, and Kick questionnaire    Fear of Current or Ex-Partner: No    Emotionally Abused: No    Physically Abused: No    Sexually Abused: No   PHYSICAL EXAM  There were no vitals filed for this visit.  There is no height or weight on file to calculate BMI.  Generalized: Well developed, in no acute distress   Neurological examination     05/12/2023    8:46 AM 10/21/2022    9:19 AM 10/15/2021   10:19 AM 06/27/2020    3:00 PM  Montreal Cognitive Assessment   Visuospatial/ Executive (0/5) 5 5 5 5   Naming (0/3) 3 3 3 3   Attention: Read list of digits (0/2) 2 2 2 2   Attention: Read list of letters (0/1) 1 1 1 1   Attention: Serial 7 subtraction starting at 100 (0/3) 3 1 3 3   Language: Repeat phrase (0/2) 2 2 2 2   Language : Fluency (0/1) 1 1 1 1   Abstraction (0/2) 2 2 2 2   Delayed Recall (0/5) 5 5 5 4   Orientation (0/6) 6 6 6 6   Total 30 28 30 29   Adjusted Score (based on education) 30  30      Levert Feinstein, M.D. Ph.D.  [facility] [address] [place], [place] [address] Phone: [phone] Fax:  [phone] 

## 2023-08-12 NOTE — Patient Instructions (Signed)
Medication Instructions:  The current medical regimen is effective;  continue present plan and medications.  *If you need a refill on your cardiac medications before your next appointment, please call your pharmacy*  Follow-Up: At [facility], you and your health needs are our priority.  As part of our continuing mission to provide you with exceptional heart care, we have created designated Provider Care Teams.  These Care Teams include your primary Cardiologist (physician) and Advanced Practice Providers (APPs -  Physician Assistants and Nurse Practitioners) who all work together to provide you with the care you need, when you need it.  We recommend signing up for the patient portal called "MyChart".  Sign up information is provided on this After Visit Summary.  MyChart is used to connect with patients for Virtual Visits (Telemedicine).  Patients are able to view lab/test results, encounter notes, upcoming appointments, etc.  Non-urgent messages can be sent to your provider as well.   To learn more about what you can do with MyChart, go to [email or web address].    Your next appointment:   1 year(s)  Provider:   Donato Schultz, MD

## 2023-08-12 NOTE — Progress Notes (Signed)
Cardiology Office Note:  .   Date:  08/12/2023  ID:  Anthony Juarez, DOB 05-07-1946, MRN [id number] PCP: Shade Flood, MD  [facility] Providers Cardiologist:  Donato Schultz, MD    History of Present Illness: .   Anthony Juarez is a 78 y.o. male Discussed the use of AI scribe  History of Present Illness   He is a 78 year old male with coronary artery disease and chronic sinus bradycardia who presents for follow-up.  He has a history of coronary artery disease with coronary calcifications in the left anterior descending artery, circumflex artery, and right coronary artery. A stress PET on August 11, 2022, was normal and low risk with an ejection fraction of 65% at stress and normal blood flow, showing no evidence of ischemia. His coronary calcium score in 2020 was 166, placing him in the 52nd percentile. He is on high-intensity statin therapy with rosuvastatin 20 mg once daily and Zetia 10 mg once daily for mixed hyperlipidemia. He experiences chest pressure or left-sided chest pain, particularly in cold weather, but these episodes have been non-concerning during ER visits.  He has chronic sinus bradycardia and experiences sensations of dyspnea and chest tightness. He avoids beta blockers and calcium channel blockers due to his bradycardia. He has had ER visits in 2024 for chest discomfort, where troponin levels were normal and PET stress tests were normal. He is an avid hiker but notes that hiking has become more challenging, requiring more breaks for breathing, especially on long uphill climbs.  An MRI of the chest on August 26, 2022, demonstrated a complex cystic appearing mass in the right lateral chest wall, likely a benign lymphangioma. The mass measured 4.4 by 2 centimeters, with no other suspicious soft tissue lesions or bony findings.  He is showing markers for Alzheimer's disease and has been experiencing cognitive issues for three to four years, including short-term  memory problems and increased dependence on his wife. There is a family history of Alzheimer's disease on his mother's side, with his grandmother and possibly his mother having suffered from it.  He uses a CPAP machine for sleep apnea, which he has been using for over twelve years. He recently updated his equipment, which now includes advanced features such as Wi-Fi connectivity.          Studies Reviewed: .        Results   LABS LDL: 67 (07/27/2022) Triglycerides: 60 (07/27/2022) HbA1c: 5.6 Hb: 13.5 Cr: 1.1 TSH: 2.8  RADIOLOGY Chest MRI: Complex cystic mass in right lateral chest wall, 4.4 x 2 cm, features favor benign indolent etiology such as lymphangioma, no other suspicious soft tissue lesions or bony findings (08/26/2022) Brain MRI: No acute findings, stable mild chronic small vessel ischemic disease (11/17/2022)  DIAGNOSTIC Echocardiogram: Normal pump function, mild mitral regurgitation, mild left ventricular hypertrophy (08/31/2022) Stress PET: Normal, low risk, ejection fraction 65% at stress, normal blood flow, coronary calcifications in LAD, circumflex artery, and RCA, no evidence of ischemia (08/11/2022) EKG: Sinus bradycardia with PACs (05/29/2022)     Risk Assessment/Calculations:            Physical Exam:   VS:  BP 134/80   Pulse (!) 49   Ht 5\' 9"  (1.753 m)   Wt 160 lb (72.6 kg)   SpO2 99%   BMI 23.63 kg/m    Wt Readings from Last 3 Encounters:  08/12/23 160 lb (72.6 kg)  07/23/23 159 lb (72.1 kg)  07/21/23 159 lb 6.4 oz (  72.3 kg)    GEN: Well nourished, well developed in no acute distress NECK: No JVD; No carotid bruits CARDIAC: Ectopy, RRR, no murmurs, no rubs, no gallops RESPIRATORY:  Clear to auscultation without rales, wheezing or rhonchi  ABDOMEN: Soft, non-tender, non-distended EXTREMITIES:  No edema; No deformity   ASSESSMENT AND PLAN: .    Assessment and Plan    Coronary Artery Disease (CAD) Coronary calcifications in the left  anterior descending artery, circumflex artery, and right coronary artery. PET stress test on 08/11/2022 showed normal blood flow and an ejection fraction of 65%, indicating no ischemia and low risk. Coronary calcium score in 2020 was 166 (52nd percentile). On high-intensity statin (rosuvastatin 20 mg once daily) and Zetia 10 mg daily for mixed hyperlipidemia. LDL was 67, triglycerides 60, and hemoglobin A1c 5.6%.  1 can infer from the PET stress test that he does not have any flow-limiting coronary artery disease thankfully. - Continue rosuvastatin 20 mg once daily - Continue Zetia 10 mg daily - Annual follow-up  Mitral Regurgitation Mild mitral regurgitation noted on echocardiogram dated 08/31/2022. No high-risk symptoms. - Monitor for symptoms - Annual follow-up  Left Ventricular Hypertrophy Mild left ventricular hypertrophy noted on echocardiogram dated 08/31/2022. - Monitor for symptoms - Annual follow-up  Bradycardia Chronic sinus bradycardia with a history of ER visits for chest discomfort. Current pulse is in the low 60s, with occasional premature atrial contractions (PACs) noted on EKG. Avoiding beta blockers and calcium channel blockers due to bradycardia. No high-risk symptoms such as syncope. - Monitor heart rate and symptoms, extra PVCs and PACs can "trick "electronic devices when measuring pulse.  Sometimes it gives a falsely lower value.  No high risk symptoms such as syncope  Chest Wall Mass Complex cystic mass in the right lateral chest wall identified on MRI dated 08/26/2022. Features favor a benign indolent etiology such as a lymphangioma. Discussed excisional biopsy with cardiothoracic surgeon Dr. Cliffton Asters.  They discussed possible excision. Patient undecided and will discuss further with spouse. Alternative is follow-up imaging to monitor the mass. - Follow-up imaging if not excised, appreciate cardiothoracic surgery opinion  Small Vessel Ischemic Disease Mild chronic  small vessel ischemic disease noted on MRI of the brain reviewed on 11/17/2022. No acute findings. - Monitor for symptoms - Annual follow-up with neurology.  He is having some memory impairment.  On mother side of the family Alzheimer's is present.  Sleep Apnea Uses CPAP for sleep apnea. Recent follow-up with sleep specialist and updated CPAP equipment. - Continue CPAP therapy - Follow-up with sleep specialist as needed  General Health Maintenance Avid hiker with good kidney function (creatinine 1.1), no anemia (hemoglobin 13.5), and good thyroid function (TSH 2.8) as of 07/27/2022. - Continue current exercise regimen - Follow Mediterranean diet - Engage in cognitive activities such as crossword puzzles  Follow-up - Schedule follow-up appointment in one year.               Signed, Donato Schultz, MD

## 2023-08-13 ENCOUNTER — Other Ambulatory Visit: Payer: Self-pay | Admitting: Cardiology

## 2023-08-17 ENCOUNTER — Telehealth: Payer: Self-pay | Admitting: Neurology

## 2023-08-17 NOTE — Telephone Encounter (Signed)
LVM and sent mychart msg informing pt of need to reschedule 12/22/23 appt - MD out

## 2023-08-18 ENCOUNTER — Ambulatory Visit: Payer: Medicare Other | Admitting: Family Medicine

## 2023-08-18 VITALS — BP 132/76 | HR 51 | Temp 98.7°F | Ht 69.0 in | Wt 160.0 lb

## 2023-08-18 DIAGNOSIS — G4731 Primary central sleep apnea: Secondary | ICD-10-CM

## 2023-08-18 DIAGNOSIS — R140 Abdominal distension (gaseous): Secondary | ICD-10-CM

## 2023-08-18 DIAGNOSIS — E782 Mixed hyperlipidemia: Secondary | ICD-10-CM | POA: Diagnosis not present

## 2023-08-18 DIAGNOSIS — K5900 Constipation, unspecified: Secondary | ICD-10-CM

## 2023-08-18 DIAGNOSIS — R3914 Feeling of incomplete bladder emptying: Secondary | ICD-10-CM | POA: Diagnosis not present

## 2023-08-18 DIAGNOSIS — N401 Enlarged prostate with lower urinary tract symptoms: Secondary | ICD-10-CM

## 2023-08-18 NOTE — Progress Notes (Signed)
 Subjective:  Patient ID: Anthony Juarez, male    DOB: [birth year]  Age: 78 y.o. MRN: [id number]  CC:  Chief Complaint  Patient presents with   Medical Management of Chronic Issues    3 months check in notes still dealing with constipation, notes has been taking metamucil and Murelax intermittently, notes has a lot of gas as well.     HPI Anthony Juarez presents for   Constipation: Discussed in October of last year.  Reassuring CBC, TSH at that time.  No red flags on history.  Increase fluid intake, fiber supplement, MiraLAX  up to daily use discussed.  Improving on follow-up visit with Metamucil daily. Some intermittent use of metamucil every few weeks, then sometimes 2 days in a row.  Miralax  1-2 per week. Still hard stools and straining.  Increased gas in evening. Some bloating, belching. Rare nausea. No fever, no vomiting.   OSA on CPAP Followed by neurology, Dr. Chalice, central sleep apnea, history of nocturnal epilepsy, seizure disorder, AVM of brain.  On Namenda  - also followed by Dr. Onita for mild cognitive impairment, MoCA 29 out of 30 at his January 30 visit.  Namenda  started at that time.  Held off on Leqembi or Kisunla given brain bleeding risk with imaging abnormality.  Lamictal  200 mg ER daily for seizure prophylaxis.  CPAP working well.   BPH Followed by urology treated with finasteride , doxazosin  for nocturia. Stable with meds, no recent urology eval. Tamsulosin added at last visit last year at urology. Note indicated tamsulosin and finasteride  only. No difficutly with flow.   Hyperlipidemia: With CAD. Low risk stress test 08/11/22. Zetia  10 mg daily, Crestor  20 mg daily.  Family history of early CAD and elevated coronary calcium  scoring previously. No new chest pains.  No new myalgias, side effects.  Saw cardiology recently. No changes - 1 year follow up.  Lab Results  Component Value Date   CHOL 153 07/27/2022   HDL 74.40 07/27/2022   LDLCALC 67 07/27/2022    TRIG 60.0 07/27/2022   CHOLHDL 2 07/27/2022   Lab Results  Component Value Date   ALT 14 05/30/2023   AST 39 05/30/2023   ALKPHOS 46 05/30/2023   BILITOT 1.0 05/30/2023    History Patient Active Problem List   Diagnosis Date Noted   Chest wall mass 12/04/2022   History of sleep apnea 12/01/2022   Nocturnal epilepsy (HCC) 12/01/2022   Reactive airways dysfunction syndrome (HCC) 12/01/2022   Family history of early CAD 05/05/2021   Mild cognitive impairment 06/27/2020   Chronic sinus bradycardia 07/09/2018   Abnormal bowel movement 07/09/2018   Hyperinflation of lungs 07/09/2018   Non-seasonal allergic rhinitis 07/09/2018   Abnormal finding on MRI of brain 12/04/2016   Benign prostatic hyperplasia with incomplete bladder emptying 11/11/2016   Mixed hyperlipidemia 05/13/2016   Esophageal reflux 03/28/2015   Shortness of breath 03/28/2015   Skin lesion of face 03/28/2015   AVM (arteriovenous malformation) brain 06/15/2013   Seizure disorder (HCC) 01/19/2013   Central sleep apnea 06/10/2012   Epigastric pain 06/06/2012   Gallstones 06/06/2012   Nausea 06/06/2012   Past Medical History:  Diagnosis Date   Allergy    Arthritis    Asthma    childhood   AVM (arteriovenous malformation) brain 06/15/2013   Balanitis    Benign prostatic hypertrophy    Central sleep apnea    Cholecystitis with cholelithiasis    Chronic sinus bradycardia 07/09/2018   Convulsive disorder (HCC)    Esophageal  reflux 03/28/2015   History of nuclear stress test    Myoview 11/16: EF 60%, normal perfusion, Low Risk   Hyperlipidemia    Prostatitis    Seizures (HCC)    Past Surgical History:  Procedure Laterality Date   ELBOW SURGERY  11/11/2000   repair   FRACTURE SURGERY     LEFT ELBOW   TONSILLECTOMY     Allergies  Allergen Reactions   Penicillins Rash    Did it involve swelling of the face/tongue/throat, SOB, or low BP? No Did it involve sudden or severe rash/hives, skin peeling, or any  reaction on the inside of your mouth or nose? Yes Did you need to seek medical attention at a hospital or doctor's office? No When did it last happen?  Decades ago. If all above answers are "NO", may proceed with cephalosporin use.    Prior to Admission medications   Medication Sig Start Date End Date Taking? Authorizing Provider  albuterol  (PROVENTIL  HFA;VENTOLIN  HFA) 108 (90 Base) MCG/ACT inhaler Inhale 2 puffs into the lungs every 4 (four) hours as needed for wheezing or shortness of breath (cough, shortness of breath or wheezing.). 06/01/17  Yes Loreli Elyn SAILOR, MD  aspirin  EC 81 MG tablet Take 81 mg by mouth daily.   Yes [provider]  Creatine Monohydrate POWD Take 5 g by mouth daily. 01/12/23  Yes [provider]  doxazosin  (CARDURA ) 2 MG tablet Take 1 tablet (2 mg total) by mouth daily. 01/28/23  Yes Levora Reyes SAUNDERS, MD  ezetimibe  (ZETIA ) 10 MG tablet Take 1 tablet (10 mg total) by mouth daily. 08/16/23  Yes Jeffrie Oneil BROCKS, MD  finasteride  (PROSCAR ) 5 MG tablet Take 1 tablet (5 mg total) by mouth daily. 01/28/23  Yes Levora Reyes SAUNDERS, MD  fluticasone  (FLONASE ) 50 MCG/ACT nasal spray USE 1 SPRAY IN EACH NOSTRIL TWICE A DAY. 03/17/22  Yes Levora Reyes SAUNDERS, MD  LamoTRIgine  200 MG TB24 24 hour tablet Take 1 tablet (200 mg total) by mouth at bedtime. 05/12/23  Yes Gayland Lauraine PARAS, NP  MAGNESIUM  CHLORIDE-CALCIUM  PO Take 300 mg by mouth daily.   Yes [provider]  Melatonin 1 MG/ML LIQD Take 2 mg by mouth.   Yes [provider]  memantine  (NAMENDA ) 10 MG tablet Take 1 tablet (10 mg total) by mouth 2 (two) times daily. 08/12/23  Yes Onita Duos, MD  montelukast  (SINGULAIR ) 10 MG tablet TAKE ONE TABLET AT BEDTIME. 07/08/23  Yes Levora Reyes SAUNDERS, MD  Multiple Vitamins-Minerals (MULTIVITAMIN WITH MINERALS) tablet Take 1 tablet by mouth daily.   Yes [provider]  omeprazole  (PRILOSEC) 20 MG capsule Take 1 capsule (20 mg total) by mouth daily. 01/22/22  Yes  Levora Reyes SAUNDERS, MD  rosuvastatin  (CRESTOR ) 20 MG tablet Take 1 tablet (20 mg total) by mouth daily. 08/16/23  Yes Jeffrie Oneil BROCKS, MD  tamsulosin (FLOMAX) 0.4 MG CAPS capsule Take 0.4 mg by mouth daily. 11/28/22  Yes [provider]   Social History   Socioeconomic History   Marital status: Married    Spouse name: [place]onia   Number of children: Not on file   Years of education: Not on file   Highest education level: Bachelor's degree (e.g., BA, AB, BS)  Occupational History   Occupation: retired    Associate Professor: RETIRED  Tobacco Use   Smoking status: Former    Current packs/day: 0.00    Average packs/day: 1 pack/day for 10.0 years (10.0 ttl pk-yrs)    Types: Cigarettes  Start date: 07/14/1967    Quit date: 07/13/1977    Years since quitting: 46.1   Smokeless tobacco: Never  Vaping Use   Vaping status: Never Used  Substance and Sexual Activity   Alcohol use: Yes    Alcohol/week: 2.0 standard drinks of alcohol    Types: 2 Cans of beer per week    Comment: occ   Drug use: No   Sexual activity: Not Currently    Birth control/protection: Post-menopausal, Abstinence  Other Topics Concern   Not on file  Social History Narrative   Patient worked in engineer, materials.   Social Drivers of Corporate Investment Banker Strain: Low Risk  (08/17/2023)   Overall Financial Resource Strain (CARDIA)    Difficulty of Paying Living Expenses: Not hard at all  Food Insecurity: No Food Insecurity (08/17/2023)   Hunger Vital Sign    Worried About Running Out of Food in the Last Year: Never true    Ran Out of Food in the Last Year: Never true  Transportation Needs: No Transportation Needs (08/17/2023)   PRAPARE - Administrator, Civil Service (Medical): No    Lack of Transportation (Non-Medical): No  Physical Activity: Sufficiently Active (08/17/2023)   Exercise Vital Sign    Days of Exercise per Week: 4 days    Minutes of Exercise per Session: 60 min  Stress: No Stress Concern  Present (08/17/2023)   [employer] of Occupational Health - Occupational Stress Questionnaire    Feeling of Stress : Only a little  Social Connections: Moderately Integrated (08/17/2023)   Social Connection and Isolation Panel [NHANES]    Frequency of Communication with Friends and Family: Never    Frequency of Social Gatherings with Friends and Family: Never    Attends Religious Services: 1 to 4 times per year    Active Member of [employer] or Organizations: Yes    Attends Engineer, Structural: More than 4 times per year    Marital Status: Married  Catering Manager Violence: Not At Risk (10/21/2022)   Humiliation, Afraid, Rape, and Kick questionnaire    Fear of Current or Ex-Partner: No    Emotionally Abused: No    Physically Abused: No    Sexually Abused: No    Review of Systems   Objective:   Vitals:   08/18/23 0959  BP: 132/76  Pulse: (!) 51  Temp: 98.7 F (37.1 C)  TempSrc: Temporal  SpO2: 100%  Weight: 160 lb (72.6 kg)  Height: 5' 9 (1.753 m)     Physical Exam Vitals reviewed.  Constitutional:      Appearance: He is well-developed.  HENT:     Head: Normocephalic and atraumatic.  Neck:     Vascular: No carotid bruit or JVD.  Cardiovascular:     Rate and Rhythm: Normal rate and regular rhythm.     Heart sounds: Normal heart sounds. No murmur heard. Pulmonary:     Effort: Pulmonary effort is normal.     Breath sounds: Normal breath sounds. No rales.  Abdominal:     General: There is no distension.     Tenderness: There is no abdominal tenderness. There is no guarding.  Musculoskeletal:     Right lower leg: No edema.     Left lower leg: No edema.  Skin:    General: Skin is warm and dry.  Neurological:     Mental Status: He is alert and oriented to person, place, and time.  Psychiatric:  Mood and Affect: Mood normal.        Assessment & Plan:  Anthony Juarez is a 78 y.o. male . Benign prostatic hyperplasia with incomplete bladder  emptying  -Followed by urology with tamsulosin, finasteride  planned.  Stop doxazosin  for now, follow-up with urology sooner than planned visit if any change in symptoms or clarification of meds noted.  Constipation, unspecified constipation type  -Recommended daily Citrucel, MiraLAX  if needed, handout given on foods that can contribute to bloating and recommended follow-up with gastroenterology.  RTC precautions if acute worsening.  Mixed hyperlipidemia  -Tolerating current regimen, continue same.  Central sleep apnea  -Stable with treatment as above, follow-up with sleep specialist planned.  Bloating symptom  -As above, low FODMAP diet, follow-up with GI, treat constipation as that likely is contributing to bloating.  RTC precautions.  No orders of the defined types were placed in this encounter.  Patient Instructions  Stop dozazosin for now, continue finasteride  and flomax based on last visit with urology. If any change in symptoms, see urology sooner than 1 year planned follow up.   Try citrucel daily to see if that is better tolerated. Miralax  if needed for constipation. Call Dr. Rollin for appointment to discuss constipation and gas. See info on bloating and foods below that can worsen gas, but again constipation can definitely increase bloating.  No other med changes at this time.  Take care!  Return to the clinic or go to the nearest emergency room if any of your symptoms worsen or new symptoms occur.   Low-FODMAP Eating Plan  FODMAP stands for fermentable oligosaccharides, disaccharides, monosaccharides, and polyols. These are sugars that are hard for some people to digest. A low-FODMAP eating plan may help some people who have irritable bowel syndrome (IBS) and certain other bowel (intestinal) diseases to manage their symptoms. This meal plan can be complicated to follow. Work with a diet and nutrition specialist (dietitian) to make a low-FODMAP eating plan that is right for you.  A dietitian can help make sure that you get enough nutrition from this diet. What are tips for following this plan? Reading food labels Check labels for hidden FODMAPs such as: High-fructose syrup. Honey. Agave. Natural fruit flavors. Onion or garlic powder. Choose low-FODMAP foods that contain 3-4 grams of fiber per serving. Check food labels for serving sizes. Eat only one serving at a time to make sure FODMAP levels stay low. Shopping Shop with a list of foods that are recommended on this diet and make a meal plan. Meal planning Follow a low-FODMAP eating plan for up to 6 weeks, or as told by your health care provider or dietitian. To follow the eating plan: Eliminate high-FODMAP foods from your diet completely. Choose only low-FODMAP foods to eat. You will do this for 2-6 weeks. Gradually reintroduce high-FODMAP foods into your diet one at a time. Most people should wait a few days before introducing the next new high-FODMAP food into their meal plan. Your dietitian can recommend how quickly you may reintroduce foods. Keep a daily record of what and how much you eat and drink. Make note of any symptoms that you have after eating. Review your daily record with a dietitian regularly to identify which foods you can eat and which foods you should avoid. General tips Drink enough fluid each day to keep your urine pale yellow. Avoid processed foods. These often have added sugar and may be high in FODMAPs. Avoid most dairy products, whole grains, and sweeteners. Work with  a dietitian to make sure you get enough fiber in your diet. Avoid high FODMAP foods at meals to manage symptoms. Recommended foods Fruits Bananas, oranges, tangerines, lemons, limes, blueberries, raspberries, strawberries, grapes, cantaloupe, honeydew melon, kiwi, papaya, passion fruit, and pineapple. Limited amounts of dried cranberries, banana chips, and shredded coconut. Vegetables Eggplant, zucchini, cucumber,  peppers, green beans, bean sprouts, lettuce, arugula, kale, Swiss chard, spinach, collard greens, bok choy, summer squash, potato, and tomato. Limited amounts of corn, carrot, and sweet potato. Green parts of scallions. Grains Gluten-free grains, such as rice, oats, buckwheat, quinoa, corn, polenta, and millet. Gluten-free pasta, bread, or cereal. Rice noodles. Corn tortillas. Meats and other proteins Unseasoned beef, pork, poultry, or fish. Eggs. [place]. Tofu (firm) and tempeh. Limited amounts of nuts and seeds, such as almonds, walnuts, brazil nuts, pecans, peanuts, nut butters, pumpkin seeds, chia seeds, and sunflower seeds. Dairy Lactose-free milk, yogurt, and kefir. Lactose-free cottage cheese and ice cream. Non-dairy milks, such as almond, coconut, hemp, and rice milk. Non-dairy yogurt. Limited amounts of goat cheese, brie, mozzarella, parmesan, swiss, and other hard cheeses. Fats and oils Butter-free spreads. Vegetable oils, such as olive, canola, and sunflower oil. Seasoning and other foods Artificial sweeteners with names that do not end in ol, such as aspartame, saccharine, and stevia. Maple syrup, white table sugar, raw sugar, brown sugar, and molasses. Mayonnaise, soy sauce, and tamari. Fresh basil, coriander, parsley, rosemary, and thyme. Beverages Water and mineral water. Sugar-sweetened soft drinks. Small amounts of orange juice or cranberry juice. Black and green tea. Most dry wines. Coffee. The items listed above may not be a complete list of foods and beverages you can eat. Contact a dietitian for more information. Foods to avoid Fruits Fresh, dried, and juiced forms of apple, pear, watermelon, peach, plum, cherries, apricots, blackberries, boysenberries, figs, nectarines, and mango. Avocado. Vegetables Chicory root, artichoke, asparagus, cabbage, snow peas, Brussels sprouts, broccoli, sugar snap peas, mushrooms, celery, and cauliflower. Onions, garlic, leeks, and the white part  of scallions. Grains Wheat, including kamut, durum, and semolina. Barley and bulgur. Couscous. Wheat-based cereals. Wheat noodles, bread, crackers, and pastries. Meats and other proteins Fried or fatty meat. Sausage. Cashews and pistachios. Soybeans, baked beans, black beans, chickpeas, kidney beans, fava beans, navy beans, lentils, black-eyed peas, and split peas. Dairy Milk, yogurt, ice cream, and soft cheese. Cream and sour cream. Milk-based sauces. Custard. Buttermilk. Soy milk. Seasoning and other foods Any sugar-free gum or candy. Foods that contain artificial sweeteners such as sorbitol, mannitol, isomalt, or xylitol. Foods that contain honey, high-fructose corn syrup, or agave. Bouillon, vegetable stock, beef stock, and chicken stock. Garlic and onion powder. Condiments made with onion, such as hummus, chutney, pickles, relish, salad dressing, and salsa. Tomato paste. Beverages Chicory-based drinks. Coffee substitutes. Chamomile tea. Fennel tea. Sweet or fortified wines such as port or sherry. Diet soft drinks made with isomalt, mannitol, maltitol, sorbitol, or xylitol. Apple, pear, and mango juice. Juices with high-fructose corn syrup. The items listed above may not be a complete list of foods and beverages you should avoid. Contact a dietitian for more information. Summary FODMAP stands for fermentable oligosaccharides, disaccharides, monosaccharides, and polyols. These are sugars that are hard for some people to digest. A low-FODMAP eating plan is a short-term diet that helps to ease symptoms of certain bowel diseases. The eating plan usually lasts up to 6 weeks. After that, high-FODMAP foods are reintroduced gradually and one at a time. This can help you find out which foods may be causing  symptoms. A low-FODMAP eating plan can be complicated. It is best to work with a dietitian who has experience with this type of plan. This information is not intended to replace advice given to you by  your health care provider. Make sure you discuss any questions you have with your health care provider. Document Revised: 11/16/2019 Document Reviewed: 11/16/2019 Elsevier Patient Education  2024 Elsevier Inc.  Bowel Obstruction A bowel obstruction is a blockage in the small or large bowel. The bowel is also called the intestine. It is a long tube that connects the stomach to the anus. When a person eats and drinks, food and fluids go from the mouth to the stomach to the small bowel. This is where most of the nutrients in the food and fluids are absorbed. After the small bowel, material passes through the large bowel for further absorption until any leftover material leaves the body as stool (feces) through the anus during a bowel movement. A bowel obstruction will prevent food and fluids from passing through the bowel as they normally do during digestion. The bowel can become partially or completely blocked. If this condition is not treated, it can be dangerous because the bowel could rupture. What are the causes? Common causes of this condition include: Scar tissue in the body (adhesions) from previous surgery or treatment with high-energy X-rays (radiation). Recent surgery. This may cause the movements of the bowel to slow down and cause food to block the intestine. Inflammatory bowel disease, such as Crohn's disease or diverticulitis. Growths or tumors. A bulging organ or tissue (hernia). Twisting of the bowel (volvulus). A swallowed object (foreign body). Slipping of a part of the bowel into another part (intussusception). What are the signs or symptoms? Symptoms of this condition include: Pain in the abdomen. Depending on the degree of obstruction, pain may be: Mild or severe. Dull cramping or sharp pain. In one area or in the entire abdomen. Nausea and vomiting. Vomit may be greenish or a yellow bile color. Bloating in the abdomen. Constipation. Being unable to pass gas. Frequent  belching. Diarrhea. This may occur if the obstruction is partial and runny stool is able to leak around the obstruction. How is this diagnosed? This condition may be diagnosed based on: A physical exam. Your medical history. Exams to look into the small intestine or the large intestine (endoscopy or colonoscopy). Imaging tests of the abdomen or pelvis, such as X-ray or CT scan. Blood or urine tests. How is this treated? Treatment for this condition depends on the cause and severity of the problem. Treatment may include: Fluids and pain medicines that are given through an IV. Your health care provider may tell you not to eat or drink if you have nausea or vomiting. A clear liquid diet. You may be asked to consume a clear liquid diet for several days. This allows the bowel to rest. Placement of a small tube (nasogastric tube) through the nose, down the throat, and into the stomach. This may relieve pain, discomfort, and nausea by removing blocked air and fluids from the stomach. It can also help the obstruction clear up faster. Surgery. This may be required if other treatments do not work. Surgery may be required for: Bowel obstruction from a hernia. This can be an emergency procedure. Scar tissue that causes frequent or severe obstructions. Follow these instructions at home: Medicines Take over-the-counter and prescription medicines only as told by your health care provider. If you were prescribed an antibiotic medicine, take it as  told by your health care provider. Do not stop taking the antibiotic even if you start to feel better. General instructions Follow instructions from your health care provider about eating and drinking restrictions. You may need to avoid solid foods and drink only clear liquids until your condition improves. Return to your normal activities as told by your health care provider. Ask your health care provider what activities are safe for you. Rest as told by your  health care provider. Avoid sitting for a long time without moving. Get up to take short walks every 1-2 hours. This is important to improve blood flow and breathing. Ask for help if you feel weak or unsteady. Keep all follow-up visits. This is important. How is this prevented? After having a bowel obstruction, you are more likely to have another. You may do the following things to prevent another obstruction: If you have a long-term (chronic) disease, pay attention to your symptoms and contact your health care provider if you have questions or concerns. Avoid becoming constipated. You may need to take these actions to prevent or treat constipation: Drink enough fluid to keep your urine pale yellow. Take over-the-counter or prescription medicines. Eat foods that are high in fiber, such as beans, whole grains, and fresh fruits and vegetables. Limit foods that are high in fat and processed sugars, such as fried or sweet foods. Stay active. Exercise for 30 minutes or more, 5 or more days each week. Ask your health care provider which exercises are safe for you. Avoid stress. Find ways to reduce stress, such as meditation, exercise, or taking time for activities that relax you. Instead of eating three large meals each day, eat three small meals with three small snacks. Work with a data processing manager to make a healthy meal plan that works for you. Do not use any products that contain nicotine or tobacco. These products include cigarettes, chewing tobacco, and vaping devices, such as e-cigarettes. If you need help quitting, ask your health care provider. Contact a health care provider if: You have a fever. You have chills. Get help right away if: You have increased pain or cramping. You vomit blood. You have uncontrolled vomiting or nausea. You cannot drink fluids because of vomiting or pain. You become confused. You begin feeling very thirsty (dehydrated). You have severe bloating. You feel extremely  weak or you faint. Summary A bowel obstruction is a blockage in the small or large bowel. A bowel obstruction will prevent food and fluids from passing through the bowel as they normally do during digestion. Treatment for this condition depends on the cause and severity of the problem. It may include fluids and pain medicines through an IV, a simple diet, a nasogastric tube, or surgery. Follow instructions from your health care provider about eating restrictions. You may need to avoid solid foods and consume only clear liquids until your condition improves. This information is not intended to replace advice given to you by your health care provider. Make sure you discuss any questions you have with your health care provider. Document Revised: 08/11/2020 Document Reviewed: 08/11/2020 Elsevier Patient Education  2024 Elsevier Inc.      Signed,   Reyes Pines, MD [facility], [facility] 08/18/23 10:37 AM

## 2023-08-18 NOTE — Patient Instructions (Addendum)
 Stop dozazosin for now, continue finasteride  and flomax based on last visit with urology. If any change in symptoms, see urology sooner than 1 year planned follow up.   Try citrucel daily to see if that is better tolerated. Miralax  if needed for constipation. Call Dr. Rollin for appointment to discuss constipation and gas. See info on bloating and foods below that can worsen gas, but again constipation can definitely increase bloating.  No other med changes at this time.  Take care!  Return to the clinic or go to the nearest emergency room if any of your symptoms worsen or new symptoms occur.   Low-FODMAP Eating Plan  FODMAP stands for fermentable oligosaccharides, disaccharides, monosaccharides, and polyols. These are sugars that are hard for some people to digest. A low-FODMAP eating plan may help some people who have irritable bowel syndrome (IBS) and certain other bowel (intestinal) diseases to manage their symptoms. This meal plan can be complicated to follow. Work with a diet and nutrition specialist (dietitian) to make a low-FODMAP eating plan that is right for you. A dietitian can help make sure that you get enough nutrition from this diet. What are tips for following this plan? Reading food labels Check labels for hidden FODMAPs such as: High-fructose syrup. Honey. Agave. Natural fruit flavors. Onion or garlic powder. Choose low-FODMAP foods that contain 3-4 grams of fiber per serving. Check food labels for serving sizes. Eat only one serving at a time to make sure FODMAP levels stay low. Shopping Shop with a list of foods that are recommended on this diet and make a meal plan. Meal planning Follow a low-FODMAP eating plan for up to 6 weeks, or as told by your health care provider or dietitian. To follow the eating plan: Eliminate high-FODMAP foods from your diet completely. Choose only low-FODMAP foods to eat. You will do this for 2-6 weeks. Gradually reintroduce high-FODMAP  foods into your diet one at a time. Most people should wait a few days before introducing the next new high-FODMAP food into their meal plan. Your dietitian can recommend how quickly you may reintroduce foods. Keep a daily record of what and how much you eat and drink. Make note of any symptoms that you have after eating. Review your daily record with a dietitian regularly to identify which foods you can eat and which foods you should avoid. General tips Drink enough fluid each day to keep your urine pale yellow. Avoid processed foods. These often have added sugar and may be high in FODMAPs. Avoid most dairy products, whole grains, and sweeteners. Work with a dietitian to make sure you get enough fiber in your diet. Avoid high FODMAP foods at meals to manage symptoms. Recommended foods Fruits Bananas, oranges, tangerines, lemons, limes, blueberries, raspberries, strawberries, grapes, cantaloupe, honeydew melon, kiwi, papaya, passion fruit, and pineapple. Limited amounts of dried cranberries, banana chips, and shredded coconut. Vegetables Eggplant, zucchini, cucumber, peppers, green beans, bean sprouts, lettuce, arugula, kale, Swiss chard, spinach, collard greens, bok choy, summer squash, potato, and tomato. Limited amounts of corn, carrot, and sweet potato. Green parts of scallions. Grains Gluten-free grains, such as rice, oats, buckwheat, quinoa, corn, polenta, and millet. Gluten-free pasta, bread, or cereal. Rice noodles. Corn tortillas. Meats and other proteins Unseasoned beef, pork, poultry, or fish. Eggs. [place]. Tofu (firm) and tempeh. Limited amounts of nuts and seeds, such as almonds, walnuts, brazil nuts, pecans, peanuts, nut butters, pumpkin seeds, chia seeds, and sunflower seeds. Dairy Lactose-free milk, yogurt, and kefir. Lactose-free cottage cheese  and ice cream. Non-dairy milks, such as almond, coconut, hemp, and rice milk. Non-dairy yogurt. Limited amounts of goat cheese, brie,  mozzarella, parmesan, swiss, and other hard cheeses. Fats and oils Butter-free spreads. Vegetable oils, such as olive, canola, and sunflower oil. Seasoning and other foods Artificial sweeteners with names that do not end in ol, such as aspartame, saccharine, and stevia. Maple syrup, white table sugar, raw sugar, brown sugar, and molasses. Mayonnaise, soy sauce, and tamari. Fresh basil, coriander, parsley, rosemary, and thyme. Beverages Water and mineral water. Sugar-sweetened soft drinks. Small amounts of orange juice or cranberry juice. Black and green tea. Most dry wines. Coffee. The items listed above may not be a complete list of foods and beverages you can eat. Contact a dietitian for more information. Foods to avoid Fruits Fresh, dried, and juiced forms of apple, pear, watermelon, peach, plum, cherries, apricots, blackberries, boysenberries, figs, nectarines, and mango. Avocado. Vegetables Chicory root, artichoke, asparagus, cabbage, snow peas, Brussels sprouts, broccoli, sugar snap peas, mushrooms, celery, and cauliflower. Onions, garlic, leeks, and the white part of scallions. Grains Wheat, including kamut, durum, and semolina. Barley and bulgur. Couscous. Wheat-based cereals. Wheat noodles, bread, crackers, and pastries. Meats and other proteins Fried or fatty meat. Sausage. Cashews and pistachios. Soybeans, baked beans, black beans, chickpeas, kidney beans, fava beans, navy beans, lentils, black-eyed peas, and split peas. Dairy Milk, yogurt, ice cream, and soft cheese. Cream and sour cream. Milk-based sauces. Custard. Buttermilk. Soy milk. Seasoning and other foods Any sugar-free gum or candy. Foods that contain artificial sweeteners such as sorbitol, mannitol, isomalt, or xylitol. Foods that contain honey, high-fructose corn syrup, or agave. Bouillon, vegetable stock, beef stock, and chicken stock. Garlic and onion powder. Condiments made with onion, such as hummus, chutney, pickles,  relish, salad dressing, and salsa. Tomato paste. Beverages Chicory-based drinks. Coffee substitutes. Chamomile tea. Fennel tea. Sweet or fortified wines such as port or sherry. Diet soft drinks made with isomalt, mannitol, maltitol, sorbitol, or xylitol. Apple, pear, and mango juice. Juices with high-fructose corn syrup. The items listed above may not be a complete list of foods and beverages you should avoid. Contact a dietitian for more information. Summary FODMAP stands for fermentable oligosaccharides, disaccharides, monosaccharides, and polyols. These are sugars that are hard for some people to digest. A low-FODMAP eating plan is a short-term diet that helps to ease symptoms of certain bowel diseases. The eating plan usually lasts up to 6 weeks. After that, high-FODMAP foods are reintroduced gradually and one at a time. This can help you find out which foods may be causing symptoms. A low-FODMAP eating plan can be complicated. It is best to work with a dietitian who has experience with this type of plan. This information is not intended to replace advice given to you by your health care provider. Make sure you discuss any questions you have with your health care provider. Document Revised: 11/16/2019 Document Reviewed: 11/16/2019 Elsevier Patient Education  2024 [employer]  Bowel Obstruction A bowel obstruction is a blockage in the small or large bowel. The bowel is also called the intestine. It is a long tube that connects the stomach to the anus. When a person eats and drinks, food and fluids go from the mouth to the stomach to the small bowel. This is where most of the nutrients in the food and fluids are absorbed. After the small bowel, material passes through the large bowel for further absorption until any leftover material leaves the body as stool (feces) through  the anus during a bowel movement. A bowel obstruction will prevent food and fluids from passing through the bowel as they  normally do during digestion. The bowel can become partially or completely blocked. If this condition is not treated, it can be dangerous because the bowel could rupture. What are the causes? Common causes of this condition include: Scar tissue in the body (adhesions) from previous surgery or treatment with high-energy X-rays (radiation). Recent surgery. This may cause the movements of the bowel to slow down and cause food to block the intestine. Inflammatory bowel disease, such as Crohn's disease or diverticulitis. Growths or tumors. A bulging organ or tissue (hernia). Twisting of the bowel (volvulus). A swallowed object (foreign body). Slipping of a part of the bowel into another part (intussusception). What are the signs or symptoms? Symptoms of this condition include: Pain in the abdomen. Depending on the degree of obstruction, pain may be: Mild or severe. Dull cramping or sharp pain. In one area or in the entire abdomen. Nausea and vomiting. Vomit may be greenish or a yellow bile color. Bloating in the abdomen. Constipation. Being unable to pass gas. Frequent belching. Diarrhea. This may occur if the obstruction is partial and runny stool is able to leak around the obstruction. How is this diagnosed? This condition may be diagnosed based on: A physical exam. Your medical history. Exams to look into the small intestine or the large intestine (endoscopy or colonoscopy). Imaging tests of the abdomen or pelvis, such as X-ray or CT scan. Blood or urine tests. How is this treated? Treatment for this condition depends on the cause and severity of the problem. Treatment may include: Fluids and pain medicines that are given through an IV. Your health care provider may tell you not to eat or drink if you have nausea or vomiting. A clear liquid diet. You may be asked to consume a clear liquid diet for several days. This allows the bowel to rest. Placement of a small tube (nasogastric  tube) through the nose, down the throat, and into the stomach. This may relieve pain, discomfort, and nausea by removing blocked air and fluids from the stomach. It can also help the obstruction clear up faster. Surgery. This may be required if other treatments do not work. Surgery may be required for: Bowel obstruction from a hernia. This can be an emergency procedure. Scar tissue that causes frequent or severe obstructions. Follow these instructions at home: Medicines Take over-the-counter and prescription medicines only as told by your health care provider. If you were prescribed an antibiotic medicine, take it as told by your health care provider. Do not stop taking the antibiotic even if you start to feel better. General instructions Follow instructions from your health care provider about eating and drinking restrictions. You may need to avoid solid foods and drink only clear liquids until your condition improves. Return to your normal activities as told by your health care provider. Ask your health care provider what activities are safe for you. Rest as told by your health care provider. Avoid sitting for a long time without moving. Get up to take short walks every 1-2 hours. This is important to improve blood flow and breathing. Ask for help if you feel weak or unsteady. Keep all follow-up visits. This is important. How is this prevented? After having a bowel obstruction, you are more likely to have another. You may do the following things to prevent another obstruction: If you have a long-term (chronic) disease, pay attention to  your symptoms and contact your health care provider if you have questions or concerns. Avoid becoming constipated. You may need to take these actions to prevent or treat constipation: Drink enough fluid to keep your urine pale yellow. Take over-the-counter or prescription medicines. Eat foods that are high in fiber, such as beans, whole grains, and fresh fruits  and vegetables. Limit foods that are high in fat and processed sugars, such as fried or sweet foods. Stay active. Exercise for 30 minutes or more, 5 or more days each week. Ask your health care provider which exercises are safe for you. Avoid stress. Find ways to reduce stress, such as meditation, exercise, or taking time for activities that relax you. Instead of eating three large meals each day, eat three small meals with three small snacks. Work with a data processing manager to make a healthy meal plan that works for you. Do not use any products that contain nicotine or tobacco. These products include cigarettes, chewing tobacco, and vaping devices, such as e-cigarettes. If you need help quitting, ask your health care provider. Contact a health care provider if: You have a fever. You have chills. Get help right away if: You have increased pain or cramping. You vomit blood. You have uncontrolled vomiting or nausea. You cannot drink fluids because of vomiting or pain. You become confused. You begin feeling very thirsty (dehydrated). You have severe bloating. You feel extremely weak or you faint. Summary A bowel obstruction is a blockage in the small or large bowel. A bowel obstruction will prevent food and fluids from passing through the bowel as they normally do during digestion. Treatment for this condition depends on the cause and severity of the problem. It may include fluids and pain medicines through an IV, a simple diet, a nasogastric tube, or surgery. Follow instructions from your health care provider about eating restrictions. You may need to avoid solid foods and consume only clear liquids until your condition improves. This information is not intended to replace advice given to you by your health care provider. Make sure you discuss any questions you have with your health care provider. Document Revised: 08/11/2020 Document Reviewed: 08/11/2020 Elsevier Patient Education  2024 [employer].

## 2023-08-22 ENCOUNTER — Encounter: Payer: Self-pay | Admitting: Family Medicine

## 2023-09-06 DIAGNOSIS — R413 Other amnesia: Secondary | ICD-10-CM | POA: Diagnosis not present

## 2023-09-13 DIAGNOSIS — R140 Abdominal distension (gaseous): Secondary | ICD-10-CM | POA: Diagnosis not present

## 2023-09-13 DIAGNOSIS — K5904 Chronic idiopathic constipation: Secondary | ICD-10-CM | POA: Diagnosis not present

## 2023-09-22 DIAGNOSIS — R413 Other amnesia: Secondary | ICD-10-CM | POA: Diagnosis not present

## 2023-09-24 ENCOUNTER — Telehealth: Payer: Self-pay | Admitting: Neurology

## 2023-09-24 NOTE — Telephone Encounter (Signed)
 Reviewed neuropsychological evaluation from 09/02/2023 with Dr. Eileen Stanford Renfroe.  His neuropsychological profile does not suggest cognitive phenotype of Alzheimer's disease or another neurodegenerative condition.  Overall he exhibits subtle cognitive inefficiencies.  No clear evidence of progressive dementia at this time.  Recommendations: Continue epilepsy treatment, discussed potential cognitive side effects of Lamictal.  OSA treatment compliance, participate in exercise, follow-up neuropsych evaluation in 12 to 18 months.  Recommended psychotherapy counseling.

## 2023-10-07 ENCOUNTER — Encounter: Payer: Self-pay | Admitting: Student in an Organized Health Care Education/Training Program

## 2023-10-07 ENCOUNTER — Ambulatory Visit (INDEPENDENT_AMBULATORY_CARE_PROVIDER_SITE_OTHER): Admitting: Student in an Organized Health Care Education/Training Program

## 2023-10-07 VITALS — BP 124/84 | HR 62 | Temp 98.5°F | Wt 160.0 lb

## 2023-10-07 DIAGNOSIS — R109 Unspecified abdominal pain: Secondary | ICD-10-CM | POA: Insufficient documentation

## 2023-10-07 NOTE — Progress Notes (Signed)
   Acute Office Visit  Subjective:     Patient ID: Keoki Mchargue, male    DOB: 26-Sep-1945, 78 y.o.   MRN: [id number]  Chief Complaint  Patient presents with   Back Pain    Patient states he has been doing lot of house hold projects, yesterday afternoon had sudden intense pain under ribs on right side. Patient is not sure what he could have done to cause this pain. Enough pain he is having trouble sleeping and moving around a little. No tingling or numbness. Has tried ibuprofen but does not seem to help for long.      HPI  Patient is in today for 1 day of right posterior flank discomfort.  He has been doing significant housework remodeling a few rooms.  Yesterday he noticed well twisting that he had a discomfort in his right flank exacerbated by rotation of his trunk.  This felt like a soreness.  No falls or trauma.  The discomfort was more significant last night keeping him up from sleep.  He has had no fevers or chills.  No recent illness.  He is eating and drinking regularly.  He has no dysuria.  No chest pain, pressure, or shortness of breath.  Still able to function today, he walked his neighbors dog for 1 mile this morning.  The discomfort improves with ambulation, only bothers him with any twisting movements.  He denies any low back pain, no radiculopathy in his legs.      Objective:    BP 124/84   Pulse 62   Temp 98.5 F (36.9 C) (Temporal)   Wt 160 lb (72.6 kg)   SpO2 97%   BMI 23.63 kg/m    Physical Exam  Gen: Well-appearing man Skin: Healthy appearing skin over the area of discomfort, no vesicular lesion or erythema or bruising Back: He has point tenderness over the muscle and tendon insertion on the right posterior floating rib.  No tenderness over the lumbar or thoracic spine.  He is able to rotate his trunk 90 degrees in both directions, but is sore with rotation to the left. Neuro: Alert, conversational, full strength in both arms, full strength in both legs,  normal get up and go, normal gait      Assessment & Plan:   Problem List Items Addressed This Visit       Unprioritized   Right flank pain - Primary   Symptoms and exam are most consistent with a muscle strain or tendinitis at the right posterior rib edge.  He has no systemic symptoms, no dysuria, no fever, so I doubt this is pyelonephritis.  The pain is very focal and reproducible on exam, so I doubt this is nephrolithiasis.  He has no pain over the spine, so I doubt a compression fracture.  No trauma so I doubt rib fracture.  The skin appears healthy, no signs of zoster.  I think there is high probability that this will resolve with supportive care in the coming 1-2 weeks.  We talked about supportive care including ibuprofen and Tylenol.  As well as rest and activity within his penis range of motion.       No follow-ups on file.  Tyson Alias, MD

## 2023-10-07 NOTE — Assessment & Plan Note (Signed)
 Symptoms and exam are most consistent with a muscle strain or tendinitis at the right posterior rib edge.  He has no systemic symptoms, no dysuria, no fever, so I doubt this is pyelonephritis.  The pain is very focal and reproducible on exam, so I doubt this is nephrolithiasis.  He has no pain over the spine, so I doubt a compression fracture.  No trauma so I doubt rib fracture.  The skin appears healthy, no signs of zoster.  I think there is high probability that this will resolve with supportive care in the coming 1-2 weeks.  We talked about supportive care including ibuprofen and Tylenol.  As well as rest and activity within his penis range of motion.

## 2023-10-07 NOTE — Patient Instructions (Signed)
 I think the pain in your right flank is probably related to a muscle strain or tendinitis.  I think this has a good chance of resolving on its own over the coming 3 to 7 days.  I recommend using ibuprofen 400 mg up to 3 times daily as needed for discomfort.  You can also use Tylenol 500 mg 4 times daily as needed.

## 2023-10-11 ENCOUNTER — Telehealth: Payer: Self-pay | Admitting: Student in an Organized Health Care Education/Training Program

## 2023-10-11 NOTE — Telephone Encounter (Signed)
 I called and left a voicemail with the patient to follow-up on his low back pain.

## 2023-11-23 ENCOUNTER — Telehealth: Payer: Self-pay | Admitting: Neurology

## 2023-11-23 NOTE — Telephone Encounter (Signed)
 LVM and sent mychart msg informing pt of need to reschedule 12/29/23 appt - MD out  The beginning of June is held for reschedules for Dr. Albertina Hugger if patient calls back

## 2023-11-29 ENCOUNTER — Telehealth: Payer: Self-pay | Admitting: Neurology

## 2023-11-29 NOTE — Telephone Encounter (Signed)
 Pt called to schedule Appt  Appt Scheduled

## 2023-12-06 NOTE — Progress Notes (Unsigned)
 ASSESSMENT AND PLAN 78 y.o. year old male     Epilepsy  -No recurrent seizures  -Continue lamotrigine  200 mg ER daily  -MRI of the brain showed stable right frontal venous malformation versus cavernous angioma  Mild cognitive impairment  -MoCA examination 29/12 May 2023   -Laboratory evaluation showed no treatable etiology  -Alzheimer profile, beta amyloid PET scan suggestive of Alzheimer's pathology, APO E4 heterozygous,  -Neuropsychological testing February 2025 was not suggestive of neurodegenerative condition.  No clear evidence of progressive dementia.  -History of right frontal venous malformation, decided to hold off on treatment with Leqembi or Kisunla due to associated right for brain bleeding  -Continue Namenda  10 mg twice daily, hold off on Aricept due to heart rate in the 50s.  As well as he feels memory has remained very stable. Will recheck MOCA at next visit   Complex sleep apnea, central sleep apnea (setup 05/11/23)  -Very good benefit.  Superb compliance.  -With change to set pressure 10 cmH2O January 2025, improvement in AHI 2.6 (central 1.4, obstructive 0.7)  -Appointment in July with Dr. Albertina Hugger, can cancel this  I will see back in 6 months for the above.  DIAGNOSTIC DATA (LABS, IMAGING, TESTING) - I reviewed patient records, labs, notes, testing and imaging myself where available.   HISTORY OF PRESENT ILLNESS:  Mr. Jowett is a 78 yo WM, following up for seizure disorder   He has PMHx of seizure since infant, he was treated with dilantin and phenobarbital for a long time, was eventurally tapered off after seizure free since age 78, he had one recurrent seizure at age 6, was put back on dilantin 400mg  qhs, has been on it since.   He had Bachelor's degree, retired as Building services engineer, Animal nutritionist  in 2009, moved to [place] about 2.5 years ago, now he stays at home most of time, rarely initiated activities, felt fatigue, difficulty  concontrating, getting worse since summer of 2013.  His wife complains that he is not up to his house projects anymore, frequent nocturia, which has prompted recent treatment with desmopression, which helped his frequent night time awaking,  He also has sleep apnea, but could not tolerate his BiPAP machine.He is seen by Dr. Bennetta Braun.   Dilantin level was 32 while he was taking Dilantin 100 mg 4 tablets each night, level decreased to 22 while he was taking Dilantin 100 mg 3 tablets each night, he has developed gingival hypertrophy, his brain fogginess has much improved with decreased dose of Dilantin, especially after he stopped taking Desopressin,    MRI scan of the brain showed a cavernous angioma with remote age hemorrhage in the right frontal subcortical region with  and adjacent  venous angioma.  There are moderate changes of chronic microvascular ischemia and mild degree of generalized cerebral atrophy.   Followup visit today patient has been switched to Keppra  XR 750mg  daily. He is off Dilantin totally and his brain fogginess has improved. He has not had further seizure activity.   UPDATE Jan 19th 2015: 78 He is now taking keppra  xr 750mg  qhs, last seizure 1992, nocturnal seizure,  he only has mild fatigue, no longer having brain foggy sensation We have reviewed MRI taking January 2015, continue the right inferior frontal cavernous venous angioma, mild atrophy, moderate small vessel disease,   UPDATE Jan 19th 2016; I have reviewed MRI brain (with and without): right frontal juxtacortical T2 heterogenous lesion (1.4x1.0cm), with "popcorn" appearance, consistent with cavernous malformation. There is an  associated small developmental venous anomaly.   Multiple round and ovoid, periventricular, subcortical and juxtacortical T2 hyperintense foci. These findings are non-specific and considerations include autoimmune, inflammatory, post-infectious, microvascular ischemic or migraine associated etiologies.      He is taking Keppra  XR 750 mg every night, there was no recurrent seizure   UPDATE Aug 01 2015: He had sleep study recently,  No need for CPAP machine, he is taking keppra  xr 750mg  qhs, no recurrent seizure.    We have reviewed MRI of the brain with and without contrast in January 2017: 1.   A focus of heterogenous signal in the superficial right frontal lobe with a hemosiderin rim and adjacent venous anomaly. This has characteristics of a cavernous malformation.   Compared to the 07/20/2013 MRI, there has been no definite change. 2.   Scattered T2/FLAIR hyperintense foci, predominantly in the subcortical deep white matter of both hemispheres. This is stable compared to the prior MRI. She is a nonspecific finding and potential etiology could be chronic microvascular ischemic change, migraine, chronic demyelination.    UPDATE Jun 21 2018: He is overall doing well, taking Keppra  750 mg tablets half tablets twice a day, does complain some moodiness, discussed with patient we decided to switch him to lamotrigine  200 mg every day  UPDATE Jun 27 2020: He is overall doing very well, has no recurrent seizure, tolerating lamotrigine  ER 200 mg well, complains of difficulty sleeping occasionally, especially after using bathroom at nighttime, he hiked with his wife regularly,  Recent couple years, noticed mild memory loss, word finding difficulties, MoCA examination 29/30 today,  We again personally reviewed MRI of brain with without contrast in 2017: Focus of heterogeneous signal at the superficial right frontal lobe, with hemosiderin rim and adjacent venous anomaly, characteristic of a cavernous malformation,   Virtual Visit via video August 12, 2023 I discussed the limitations of evaluation and management by telemedicine and the availability of in person appointments. The patient expressed understanding and agreed to proceed  Location: Provider: [facility] office; Patient: Home  I connected with  Sherril Dole  on August 12, 2023 by a video enabled telemedicine application and verified that I am speaking with the correct person using two identifiers.  UPDATED HiSTORY He continue taking lamotrigine  ER 200 mg every night, no recurrent seizure  Over the past few years, he struggled with short-term memory loss, direction while driving, still active, hiking regularly, hiked to 1000 miles in 2024, volunteers at food pantry,  MoCA examination in October 2024 was 30/30  Sleep study in July 2024 showed mild to moderate obstructive sleep apnea with significant REM sleep tendency, high portion of central apnea, CPAP titration since September 2024, did help him some  MRI of the brain in May 2024 showed stable right frontal developmental venous anomaly versus venous malformation, With his concern of memory issue, He was offered amyloid PET scan June 15, 2023, positive, moderate to frequent neurotic beta amyloid plaque in the brain, suggestive of Alzheimer's pathology  ATN profile showed decreased beta amyloid 42/40 ratio, with elevated p-tau 181, also consistent with Alzheimer's pathology  ApoE genotyping showed 1 copy of APO E4 variant,/Apo E3,  Update Dec 08, 2023 SS: Review of CPAP data shows 100% compliance, AHI 2.6, leak 11.4 set pressure 10 cmH2O. He looks forward to putting CPAP on at night, because he sleeps better. Can now sleep solid 4-5 hours before waking up. No seizures remains on Lamictal .   Feels memory is about the same. Dr. Gracie Lav  started Namenda  10 mg twice daily. Tolerating well. Continues with issues with recall.   Reviewed neuropsychological evaluation from 09/02/2023 with Dr. Jenna Renfroe.  His neuropsychological profile does not suggest cognitive phenotype of Alzheimer's disease or another neurodegenerative condition.  Overall he exhibits subtle cognitive inefficiencies.  No clear evidence of progressive dementia at this time.   Recommendations: Continue epilepsy  treatment, discussed potential cognitive side effects of Lamictal .  OSA treatment compliance, participate in exercise, follow-up neuropsych evaluation in 12 to 18 months.  Recommended psychotherapy counseling.  REVIEW OF SYSTEMS: Out of a complete 14 system review of symptoms, the patient complains only of the following symptoms, and all other reviewed systems are negative.  Seizures  ALLERGIES: Allergies  Allergen Reactions   Penicillins Rash    Did it involve swelling of the face/tongue/throat, SOB, or low BP? No  Did it involve sudden or severe rash/hives, skin peeling, or any reaction on the inside of your mouth or nose? Yes  Did you need to seek medical attention at a hospital or doctor's office? No  When did it last happen?  Decades ago.  If all above answers are "NO", may proceed with cephalosporin use.  Injectable- rash at injection site    Did it involve swelling of the face/tongue/throat, SOB, or low BP? No Did it involve sudden or severe rash/hives, skin peeling, or any reaction on the inside of your mouth or nose? Yes Did you need to seek medical attention at a hospital or doctor's office? No When did it last happen?  Decades ago. If all above answers are "NO", may proceed with cephalosporin use.    HOME MEDICATIONS: Outpatient Medications Prior to Visit  Medication Sig Dispense Refill   albuterol  (PROVENTIL  HFA;VENTOLIN  HFA) 108 (90 Base) MCG/ACT inhaler Inhale 2 puffs into the lungs every 4 (four) hours as needed for wheezing or shortness of breath (cough, shortness of breath or wheezing.). 1 Inhaler 2   aspirin  EC 81 MG tablet Take 81 mg by mouth daily.     Creatine Monohydrate POWD Take 5 g by mouth daily.     ezetimibe  (ZETIA ) 10 MG tablet Take 1 tablet (10 mg total) by mouth daily. 90 tablet 3   finasteride  (PROSCAR ) 5 MG tablet Take 1 tablet (5 mg total) by mouth daily. 90 tablet 2   fluticasone  (FLONASE ) 50 MCG/ACT nasal spray USE 1 SPRAY IN EACH NOSTRIL TWICE A  DAY. 16 g 6   LamoTRIgine  200 MG TB24 24 hour tablet Take 1 tablet (200 mg total) by mouth at bedtime. 90 tablet 4   Melatonin 1 MG/ML LIQD Take 2 mg by mouth.     memantine  (NAMENDA ) 10 MG tablet Take 1 tablet (10 mg total) by mouth 2 (two) times daily. 60 tablet 11   montelukast  (SINGULAIR ) 10 MG tablet TAKE ONE TABLET AT BEDTIME. 90 tablet 3   Multiple Vitamins-Minerals (MULTIVITAMIN WITH MINERALS) tablet Take 1 tablet by mouth daily.     omeprazole  (PRILOSEC) 20 MG capsule Take 1 capsule (20 mg total) by mouth daily. 30 capsule 0   rosuvastatin  (CRESTOR ) 20 MG tablet Take 1 tablet (20 mg total) by mouth daily. 90 tablet 3   tamsulosin (FLOMAX) 0.4 MG CAPS capsule Take 0.4 mg by mouth daily.     doxazosin  (CARDURA ) 2 MG tablet Take 1 tablet (2 mg total) by mouth daily. (Patient not taking: Reported on 12/07/2023) 90 tablet 2   MAGNESIUM  CHLORIDE-CALCIUM  PO Take 300 mg by mouth daily. (Patient not taking: Reported  on 12/07/2023)     No facility-administered medications prior to visit.    PAST MEDICAL HISTORY: Past Medical History:  Diagnosis Date   Allergy    Arthritis    Asthma    childhood   AVM (arteriovenous malformation) brain 06/15/2013   Balanitis    Benign prostatic hypertrophy    Central sleep apnea    Cholecystitis with cholelithiasis    Chronic sinus bradycardia 07/09/2018   Convulsive disorder (HCC)    Esophageal reflux 03/28/2015   History of nuclear stress test    Myoview 11/16: EF 60%, normal perfusion, Low Risk   Hyperlipidemia    Prostatitis    Seizures (HCC)     PAST SURGICAL HISTORY: Past Surgical History:  Procedure Laterality Date   ELBOW SURGERY  11/11/2000   repair   FRACTURE SURGERY     LEFT ELBOW   TONSILLECTOMY      FAMILY HISTORY: Family History  Problem Relation Age of Onset   Heart disease Father        cabg 64's   Cancer Father        Prostate   Heart disease Maternal Grandfather        Late 29's   Dementia Mother     SOCIAL  HISTORY: Social History   Socioeconomic History   Marital status: Married    Spouse name: Tereasa Felty   Number of children: Not on file   Years of education: Not on file   Highest education level: Bachelor's degree (e.g., BA, AB, BS)  Occupational History   Occupation: retired    Associate Professor: RETIRED  Tobacco Use   Smoking status: Former    Current packs/day: 0.00    Average packs/day: 1 pack/day for 10.0 years (10.0 ttl pk-yrs)    Types: Cigarettes    Start date: 07/14/1967    Quit date: 07/13/1977    Years since quitting: 46.4   Smokeless tobacco: Never  Vaping Use   Vaping status: Never Used  Substance and Sexual Activity   Alcohol use: Yes    Alcohol/week: 2.0 standard drinks of alcohol    Types: 2 Cans of beer per week    Comment: occ   Drug use: No   Sexual activity: Not Currently    Birth control/protection: Post-menopausal, Abstinence  Other Topics Concern   Not on file  Social History Narrative   Patient worked in Engineer, materials.   Social Drivers of Corporate investment banker Strain: Low Risk  (08/17/2023)   Overall Financial Resource Strain (CARDIA)    Difficulty of Paying Living Expenses: Not hard at all  Food Insecurity: No Food Insecurity (08/17/2023)   Hunger Vital Sign    Worried About Running Out of Food in the Last Year: Never true    Ran Out of Food in the Last Year: Never true  Transportation Needs: No Transportation Needs (08/17/2023)   PRAPARE - Administrator, Civil Service (Medical): No    Lack of Transportation (Non-Medical): No  Physical Activity: Sufficiently Active (08/17/2023)   Exercise Vital Sign    Days of Exercise per Week: 4 days    Minutes of Exercise per Session: 60 min  Stress: No Stress Concern Present (08/17/2023)   [employer] of Occupational Health - Occupational Stress Questionnaire    Feeling of Stress : Only a little  Social Connections: Moderately Integrated (08/17/2023)   Social Connection and Isolation Panel [NHANES]     Frequency of Communication with Friends and Family: Never  Frequency of Social Gatherings with Friends and Family: Never    Attends Religious Services: 1 to 4 times per year    Active Member of [employer] or Organizations: Yes    Attends Engineer, structural: More than 4 times per year    Marital Status: Married  Catering manager Violence: Not At Risk (10/21/2022)   Humiliation, Afraid, Rape, and Kick questionnaire    Fear of Current or Ex-Partner: No    Emotionally Abused: No    Physically Abused: No    Sexually Abused: No   PHYSICAL EXAM  Vitals:   12/07/23 1445  BP: (!) 154/86  Pulse: (!) 55  Weight: 163 lb (73.9 kg)  Height: 5\' 9"  (1.753 m)    Body mass index is 24.07 kg/m.  Physical Exam  General: The patient is alert and cooperative at the time of the examination.  Skin: No significant peripheral edema is noted.  Neurologic Exam  Mental status: The patient is alert and oriented x 3 at the time of the examination. The patient has apparent normal recent and remote memory, with an apparently normal attention span and concentration ability.  Cranial nerves: Facial symmetry is present. Speech is normal, no aphasia or dysarthria is noted. Extraocular movements are full. Visual fields are full.  Motor: The patient has good strength in all 4 extremities.  Sensory examination: Soft touch sensation is symmetric on the face, arms, and legs.  Coordination: The patient has good finger-nose-finger and heel-to-shin bilaterally.  Gait and station: The patient has a normal gait.  Reflexes: Deep tendon reflexes are symmetric.     05/12/2023    8:46 AM 10/21/2022    9:19 AM 10/15/2021   10:19 AM 06/27/2020    3:00 PM  Montreal Cognitive Assessment   Visuospatial/ Executive (0/5) 5 5 5 5   Naming (0/3) 3 3 3 3   Attention: Read list of digits (0/2) 2 2 2 2   Attention: Read list of letters (0/1) 1 1 1 1   Attention: Serial 7 subtraction starting at 100 (0/3) 3 1 3 3    Language: Repeat phrase (0/2) 2 2 2 2   Language : Fluency (0/1) 1 1 1 1   Abstraction (0/2) 2 2 2 2   Delayed Recall (0/5) 5 5 5 4   Orientation (0/6) 6 6 6 6   Total 30 28 30 29   Adjusted Score (based on education) 30  30    Cortland Ding, [facility], [place]  [facility] [address] [place], [place] [address] [phone]

## 2023-12-07 ENCOUNTER — Ambulatory Visit: Payer: Self-pay

## 2023-12-07 ENCOUNTER — Ambulatory Visit (INDEPENDENT_AMBULATORY_CARE_PROVIDER_SITE_OTHER): Payer: Medicare Other | Admitting: Neurology

## 2023-12-07 ENCOUNTER — Encounter: Payer: Self-pay | Admitting: Neurology

## 2023-12-07 VITALS — BP 154/86 | HR 55 | Ht 69.0 in | Wt 163.0 lb

## 2023-12-07 DIAGNOSIS — G4731 Primary central sleep apnea: Secondary | ICD-10-CM | POA: Diagnosis not present

## 2023-12-07 DIAGNOSIS — G40909 Epilepsy, unspecified, not intractable, without status epilepticus: Secondary | ICD-10-CM

## 2023-12-07 DIAGNOSIS — G3184 Mild cognitive impairment, so stated: Secondary | ICD-10-CM

## 2023-12-07 NOTE — Telephone Encounter (Signed)
 Pt seeing you tomorrow for appt

## 2023-12-07 NOTE — Telephone Encounter (Signed)
 Noted. Thanks.

## 2023-12-07 NOTE — Telephone Encounter (Signed)
  Chief Complaint: chest injury from leaning over side of trash can Symptoms: pain worse with deep breath Frequency: about a week Pertinent Negatives: Patient denies fever, SOB, swelling, redness Disposition: [] ED /[] Urgent Care (no appt availability in office) / [x] Appointment(In office/virtual)/ []  [place] [facility]/ [] Home Care/ [] Refused Recommended Disposition /[] [place]/ []  Follow-up with PCP Additional Notes:  Pt states that he was reaching over a garbage can stating that he maybe felt a "twinge". Pt states that it has bothered him since that time. Pt states that taking a deep breath makes the pain worse. Pt scheduled with PCP tomorrow.  Copied from CRM [id number]. Topic: Clinical - Red Word Triage >> Dec 07, 2023 10:55 AM Howard Macho wrote: Red Word that prompted transfer to Nurse Triage: patient called stating he hurt his rib last week by reaching into a trash can and it pushed on his rib. Patent has soreness and when he inhales it hurts Reason for Disposition  [1] MODERATE pain (e.g., interferes with normal activities) AND [2] high-risk adult (e.g., age > 60 years, osteoporosis, chronic steroid use)  Answer Assessment - Initial Assessment Questions 1. MECHANISM: "How did the injury happen?"     Leaned over the side of the garbage can 2. ONSET: "When did the injury happen?" (Minutes or hours ago)     About a week ago 3. LOCATION: "Where on the chest is the injury located?"     L side midway  4. APPEARANCE: "What does the injury look like?"     denies 5. BLEEDING: "Is there any bleeding now? If Yes, ask: How long has it been bleeding?"     denies 6. SEVERITY: "Any difficulty with breathing?"     Pain with full inhale 7. SIZE: For cuts, bruises, or swelling, ask: "How large is it?" (e.g., inches or centimeters)     denies 8. PAIN: "Is there pain?" If Yes, ask: "How bad is the pain?"   (e.g., Scale 1-10; or mild, moderate, severe)     7 9. TETANUS: For any breaks in  the skin, ask: "When was the last tetanus booster?"     denies  Protocols used: Chest Injury-A-AH

## 2023-12-07 NOTE — Patient Instructions (Signed)
 Continue to use CPAP nightly minimum 4 hours.  Continue Lamictal  for seizure prevention.  Continue Namenda  for memory.  Recommend exercise, brain stimulating activity, management of vascular risk factors for brain health.  Follow-up in 6 months.  Please call for any questions.  Thanks!!

## 2023-12-08 ENCOUNTER — Encounter: Payer: Self-pay | Admitting: Family Medicine

## 2023-12-08 ENCOUNTER — Ambulatory Visit (INDEPENDENT_AMBULATORY_CARE_PROVIDER_SITE_OTHER): Admitting: Family Medicine

## 2023-12-08 ENCOUNTER — Ambulatory Visit (HOSPITAL_BASED_OUTPATIENT_CLINIC_OR_DEPARTMENT_OTHER)
Admission: RE | Admit: 2023-12-08 | Discharge: 2023-12-08 | Disposition: A | Discharge status: Home / Self Care | Source: Ambulatory Visit | Attending: Family Medicine | Admitting: Family Medicine

## 2023-12-08 VITALS — BP 134/60 | HR 61 | Ht 69.0 in | Wt 163.1 lb

## 2023-12-08 DIAGNOSIS — J9811 Atelectasis: Secondary | ICD-10-CM | POA: Diagnosis not present

## 2023-12-08 DIAGNOSIS — R0789 Other chest pain: Secondary | ICD-10-CM

## 2023-12-08 DIAGNOSIS — S299XXA Unspecified injury of thorax, initial encounter: Secondary | ICD-10-CM | POA: Diagnosis not present

## 2023-12-08 DIAGNOSIS — R0781 Pleurodynia: Secondary | ICD-10-CM | POA: Diagnosis not present

## 2023-12-08 MED ORDER — MELOXICAM 7.5 MG PO TABS: 7.5000 mg | ORAL_TABLET | Freq: Every day | ORAL | 0 refills | Status: AC | PRN

## 2023-12-08 NOTE — Progress Notes (Signed)
 Subjective:  Patient ID: Anthony Juarez, male    DOB: 1945/08/14  Age: 78 y.o. MRN: [id number]  CC:  Chief Complaint  Patient presents with   Rib Injury    HPI Anthony Juarez presents for   Rib injury: See telephone note.  Sore on side after reaching a trash can with pressing on rib last week.  Happened 8 days ago - reaching into trash can - top of can pressed on chest  - acute pain on side -unsure if pop. No bruising, skin intact.  No dyspnea, but hurts to take in a deep breath. No other areas of pain or injury.  No new cough/fevers.  Getting slightly better.  Tx: ibuprofen - 6 per day initially - tapered back - none today, ice.  Sleeping ok.  No prior rib fracture.    History Patient Active Problem List   Diagnosis Date Noted   Right flank pain 10/07/2023   Chest wall mass 12/04/2022   History of sleep apnea 12/01/2022   Nocturnal epilepsy (HCC) 12/01/2022   Reactive airways dysfunction syndrome (HCC) 12/01/2022   Family history of early CAD 05/05/2021   Mild cognitive impairment 06/27/2020   Chronic sinus bradycardia 07/09/2018   Abnormal bowel movement 07/09/2018   Hyperinflation of lungs 07/09/2018   Non-seasonal allergic rhinitis 07/09/2018   Abnormal finding on MRI of brain 12/04/2016   Benign prostatic hyperplasia with incomplete bladder emptying 11/11/2016   Mixed hyperlipidemia 05/13/2016   Esophageal reflux 03/28/2015   Shortness of breath 03/28/2015   Skin lesion of face 03/28/2015   AVM (arteriovenous malformation) brain 06/15/2013   Seizure disorder (HCC) 01/19/2013   Central sleep apnea 06/10/2012   Epigastric pain 06/06/2012   Gallstones 06/06/2012   Nausea 06/06/2012   Past Medical History:  Diagnosis Date   Allergy    Arthritis    Asthma    childhood   AVM (arteriovenous malformation) brain 06/15/2013   Balanitis    Benign prostatic hypertrophy    Central sleep apnea    Cholecystitis with cholelithiasis    Chronic sinus bradycardia  07/09/2018   Convulsive disorder (HCC)    Esophageal reflux 03/28/2015   History of nuclear stress test    Myoview 11/16: EF 60%, normal perfusion, Low Risk   Hyperlipidemia    Prostatitis    Seizures (HCC)    Past Surgical History:  Procedure Laterality Date   ELBOW SURGERY  11/11/2000   repair   FRACTURE SURGERY     LEFT ELBOW   TONSILLECTOMY     Allergies  Allergen Reactions   Penicillins Rash    Did it involve swelling of the face/tongue/throat, SOB, or low BP? No  Did it involve sudden or severe rash/hives, skin peeling, or any reaction on the inside of your mouth or nose? Yes  Did you need to seek medical attention at a hospital or doctor's office? No  When did it last happen?  Decades ago.  If all above answers are "NO", may proceed with cephalosporin use.  Injectable- rash at injection site    Did it involve swelling of the face/tongue/throat, SOB, or low BP? No Did it involve sudden or severe rash/hives, skin peeling, or any reaction on the inside of your mouth or nose? Yes Did you need to seek medical attention at a hospital or doctor's office? No When did it last happen?  Decades ago. If all above answers are "NO", may proceed with cephalosporin use.   Prior to Admission medications   Medication  Sig Start Date End Date Taking? Authorizing Provider  albuterol  (PROVENTIL  HFA;VENTOLIN  HFA) 108 (90 Base) MCG/ACT inhaler Inhale 2 puffs into the lungs every 4 (four) hours as needed for wheezing or shortness of breath (cough, shortness of breath or wheezing.). 06/01/17  Yes Cranston Dk, MD  aspirin  EC 81 MG tablet Take 81 mg by mouth daily.   Yes [provider]  Creatine Monohydrate POWD Take 5 g by mouth daily. 01/12/23  Yes [provider]  ezetimibe  (ZETIA ) 10 MG tablet Take 1 tablet (10 mg total) by mouth daily. 08/16/23  Yes Hugh Madura, MD  finasteride  (PROSCAR ) 5 MG tablet Take 1 tablet (5 mg total) by mouth daily. 01/28/23  Yes Benjiman Bras, MD   fluticasone  (FLONASE ) 50 MCG/ACT nasal spray USE 1 SPRAY IN EACH NOSTRIL TWICE A DAY. 03/17/22  Yes Benjiman Bras, MD  LamoTRIgine  200 MG TB24 24 hour tablet Take 1 tablet (200 mg total) by mouth at bedtime. 05/12/23  Yes Wess Hammed, NP  Melatonin 1 MG/ML LIQD Take 2 mg by mouth.   Yes [provider]  memantine  (NAMENDA ) 10 MG tablet Take 1 tablet (10 mg total) by mouth 2 (two) times daily. 08/12/23  Yes Phebe Brasil, MD  montelukast  (SINGULAIR ) 10 MG tablet TAKE ONE TABLET AT BEDTIME. 07/08/23  Yes Benjiman Bras, MD  Multiple Vitamins-Minerals (MULTIVITAMIN WITH MINERALS) tablet Take 1 tablet by mouth daily.   Yes [provider]  omeprazole  (PRILOSEC) 20 MG capsule Take 1 capsule (20 mg total) by mouth daily. 01/22/22  Yes Benjiman Bras, MD  rosuvastatin  (CRESTOR ) 20 MG tablet Take 1 tablet (20 mg total) by mouth daily. 08/16/23  Yes Hugh Madura, MD  tamsulosin (FLOMAX) 0.4 MG CAPS capsule Take 0.4 mg by mouth daily. 11/28/22  Yes [provider]  doxazosin  (CARDURA ) 2 MG tablet Take 1 tablet (2 mg total) by mouth daily. Patient not taking: Reported on 12/07/2023 01/28/23   Benjiman Bras, MD  MAGNESIUM  CHLORIDE-CALCIUM  PO Take 300 mg by mouth daily. Patient not taking: Reported on 12/07/2023    [provider]   Social History   Socioeconomic History   Marital status: Married    Spouse name: Tereasa Felty   Number of children: Not on file   Years of education: Not on file   Highest education level: Bachelor's degree (e.g., BA, AB, BS)  Occupational History   Occupation: retired    Associate Professor: RETIRED  Tobacco Use   Smoking status: Former    Current packs/day: 0.00    Average packs/day: 1 pack/day for 10.0 years (10.0 ttl pk-yrs)    Types: Cigarettes    Start date: 07/14/1967    Quit date: 07/13/1977    Years since quitting: 46.4   Smokeless tobacco: Never  Vaping Use   Vaping status: Never Used  Substance and Sexual Activity   Alcohol use: Yes     Alcohol/week: 2.0 standard drinks of alcohol    Types: 2 Cans of beer per week    Comment: occ   Drug use: No   Sexual activity: Not Currently    Birth control/protection: Post-menopausal, Abstinence  Other Topics Concern   Not on file  Social History Narrative   Patient worked in Engineer, materials.   Social Drivers of Corporate investment banker Strain: Low Risk  (08/17/2023)   Overall Financial Resource Strain (CARDIA)    Difficulty of Paying Living Expenses: Not hard at all  Food Insecurity: No  Food Insecurity (08/17/2023)   Hunger Vital Sign    Worried About Running Out of Food in the Last Year: Never true    Ran Out of Food in the Last Year: Never true  Transportation Needs: No Transportation Needs (08/17/2023)   PRAPARE - Administrator, Civil Service (Medical): No    Lack of Transportation (Non-Medical): No  Physical Activity: Sufficiently Active (08/17/2023)   Exercise Vital Sign    Days of Exercise per Week: 4 days    Minutes of Exercise per Session: 60 min  Stress: No Stress Concern Present (08/17/2023)   [employer] of Occupational Health - Occupational Stress Questionnaire    Feeling of Stress : Only a little  Social Connections: Moderately Integrated (08/17/2023)   Social Connection and Isolation Panel [NHANES]    Frequency of Communication with Friends and Family: Never    Frequency of Social Gatherings with Friends and Family: Never    Attends Religious Services: 1 to 4 times per year    Active Member of [employer] or Organizations: Yes    Attends Engineer, structural: More than 4 times per year    Marital Status: Married  Catering manager Violence: Not At Risk (10/21/2022)   Humiliation, Afraid, Rape, and Kick questionnaire    Fear of Current or Ex-Partner: No    Emotionally Abused: No    Physically Abused: No    Sexually Abused: No    Review of Systems   Objective:   Vitals:   12/08/23 1358  BP: 134/60  Pulse: 61  SpO2: 100%   Weight: 163 lb 2 oz (74 kg)  Height: 5\' 9"  (1.753 m)     Physical Exam Cardiovascular:     Rate and Rhythm: Bradycardia present.     Comments: With intermittent ectopic beat.  Pulmonary:     Effort: Pulmonary effort is normal. No respiratory distress.     Breath sounds: Normal breath sounds. No stridor. No wheezing, rhonchi or rales.  Chest:     Chest wall: Tenderness (Left-sided chest wall approximately 8th and 9th rib, anterior axillary line, skin intact without ecchymosis or erythema.) present.  Abdominal:     General: Abdomen is flat. Bowel sounds are normal.     Tenderness: There is no abdominal tenderness. There is no guarding.        Assessment & Plan:  Anthony Juarez is a 78 y.o. male . Rib injury - Plan: meloxicam  (MOBIC ) 7.5 MG tablet, DG Ribs Unilateral W/Chest Left  Chest wall pain - Plan: meloxicam  (MOBIC ) 7.5 MG tablet, DG Ribs Unilateral W/Chest Left Suspected rib fracture versus rib contusion.  Symptoms have slightly improved.  Breath sounds overall sounded clear and equal bilaterally.  Unlikely pneumothorax, unlikely hemothorax.  Will check imaging with rib series but we discussed that some rib fractures may not be detected on rib series.  Given some improvement we will hold on advanced imaging at this time, symptomatic care discussed.  Declined need for gabapentin or narcotic pain meds at this time.  Requested Mobic  which I think is reasonable, use in place of ibuprofen, advised to let me know if stronger med needed. RTC/ER precautions given.  Meds ordered this encounter  Medications   meloxicam  (MOBIC ) 7.5 MG tablet    Sig: Take 1 tablet (7.5 mg total) by mouth daily as needed for pain.    Dispense:  30 tablet    Refill:  0   Patient Instructions  I do think you have a possible rib  fracture or contusion.  See information below.  Please have x-ray performed at [facility] location below and if I see any concerns on that imaging I will let you know.  Glad  to hear symptoms are starting to improve.  Okay to switch from ibuprofen to meloxicam  temporarily but try not to use that more than a week or 2 in a row.  If any new or worsening symptoms be seen.  Try to take a deep breath during commercials when watching TV, and if any new cough or shortness of breath be seen.  Take care!  Rib Fracture  A rib fracture is a break or crack in one of the bones of the ribs. The ribs are like a cage that goes around your upper chest. A broken or cracked rib is often painful, but most do not cause other problems. Most rib fractures usually heal on their own in 1-3 months. What are the causes? Doing movements over and over again with a lot of force, such as pitching a baseball or having a very bad cough. A direct hit to the chest. Cancer that has spread to the bones. What are the signs or symptoms? Pain when you breathe in or cough. Pain when someone presses on the injured area. Feeling short of breath. How is this treated? Treatment depends on how bad the fracture is. In general: Most rib fractures usually heal on their own in 1-3 months. Healing may take longer if you have a cough or are doing activities that make the injury worse. While you heal, you may be given medicines to control pain. You will also be taught deep breathing exercises. Very bad injuries may require a stay at the hospital or surgery. Follow these instructions at home: Managing pain, stiffness, and swelling If told, put ice on the injured area. To do this: Put ice in a plastic bag. Place a towel between your skin and the bag. Leave the ice on for 20 minutes, 2-3 times a day. Take off the ice if your skin turns bright red. This is very important. If you cannot feel pain, heat, or cold, you have a greater risk of damage to the area. Take over-the-counter and prescription medicines only as told by your doctor. Activity Avoid activities that cause pain to the injured area. Protect your  injured area. Slowly increase activity as told by your doctor. General instructions Do deep breathing exercises as told by your doctor. You may be told to: Take deep breaths many times a day. Cough several times a day while hugging a pillow. Use a device (incentive spirometer) to do deep breathing many times a day. Drink enough fluid to keep your pee (urine) clear or pale yellow. Do not wear a rib belt or binder. Keep all follow-up visits. Contact a doctor if: You have a fever. Get help right away if: You have trouble breathing. You are short of breath. You cannot stop coughing. You cough up thick or bloody spit. You feel like you may vomit (nauseous), vomit, or have belly (abdominal) pain. Your pain gets worse and medicine does not help. These symptoms may be an emergency. Get help right away. Call your local emergency services (911 in the [place] Do not wait to see if the symptoms will go away. Do not drive yourself to the hospital. Summary A rib fracture is a break or crack in one of the bones of the ribs. Apply ice to the injured area and take medicines for pain as told  by your doctor. Take deep breaths and cough several times a day. Hug a pillow every time you cough. This information is not intended to replace advice given to you by your health care provider. Make sure you discuss any questions you have with your health care provider. Document Revised: 05/17/2023 Document Reviewed: 05/17/2023 Elsevier Patient Education  2024 Elsevier Inc.  Chest Wall Pain Chest wall pain is pain in or around the bones and muscles of your chest. Sometimes, an injury causes this pain. Excessive coughing or overuse of arm and chest muscles may also cause chest wall pain. Sometimes, the cause may not be known. This pain may take several weeks or longer to get better. Follow these instructions at home: Managing pain, stiffness, and swelling  If directed, put ice on the painful area: Put ice in a  plastic bag. Place a towel between your skin and the bag. Leave the ice on for 20 minutes, 2-3 times per day. Activity Rest as told by your health care provider. Avoid activities that cause pain. These include any activities that use your chest muscles or your abdominal and side muscles to lift heavy items. Ask your health care provider what activities are safe for you. General instructions  Take over-the-counter and prescription medicines only as told by your health care provider. Do not use any products that contain nicotine or tobacco, such as cigarettes, e-cigarettes, and chewing tobacco. These can delay healing after injury. If you need help quitting, ask your health care provider. Keep all follow-up visits as told by your health care provider. This is important. Contact a health care provider if: You have a fever. Your chest pain becomes worse. You have new symptoms. Get help right away if: You have nausea or vomiting. You feel sweaty or light-headed. You have a cough with mucus from your lungs (sputum) or you cough up blood. You develop shortness of breath. These symptoms may represent a serious problem that is an emergency. Do not wait to see if the symptoms will go away. Get medical help right away. Call your local emergency services (911 in the [place] Do not drive yourself to the hospital. Summary Chest wall pain is pain in or around the bones and muscles of your chest. Depending on the cause, it may be treated with ice, rest, medicines, and avoiding activities that cause pain. Contact a health care provider if you have a fever, worsening chest pain, or new symptoms. Get help right away if you feel light-headed or you develop shortness of breath. These symptoms may be an emergency. This information is not intended to replace advice given to you by your health care provider. Make sure you discuss any questions you have with your health care provider. Document Revised: 06/22/2022  Document Reviewed: 06/22/2022 Elsevier Patient Education  2024 Elsevier Inc.    Signed,   Caro Christmas, MD [facility], [facility] 12/08/23 2:37 PM

## 2023-12-08 NOTE — Patient Instructions (Addendum)
 I do think you have a possible rib fracture or contusion.  See information below.  Please have x-ray performed at [facility] location below and if I see any concerns on that imaging I will let you know.  Glad to hear symptoms are starting to improve.  Okay to switch from ibuprofen to meloxicam  temporarily but try not to use that more than a week or 2 in a row.  If any new or worsening symptoms be seen.  Try to take a deep breath during commercials when watching TV, and if any new cough or shortness of breath be seen.  Take care!  Rib Fracture  A rib fracture is a break or crack in one of the bones of the ribs. The ribs are like a cage that goes around your upper chest. A broken or cracked rib is often painful, but most do not cause other problems. Most rib fractures usually heal on their own in 1-3 months. What are the causes? Doing movements over and over again with a lot of force, such as pitching a baseball or having a very bad cough. A direct hit to the chest. Cancer that has spread to the bones. What are the signs or symptoms? Pain when you breathe in or cough. Pain when someone presses on the injured area. Feeling short of breath. How is this treated? Treatment depends on how bad the fracture is. In general: Most rib fractures usually heal on their own in 1-3 months. Healing may take longer if you have a cough or are doing activities that make the injury worse. While you heal, you may be given medicines to control pain. You will also be taught deep breathing exercises. Very bad injuries may require a stay at the hospital or surgery. Follow these instructions at home: Managing pain, stiffness, and swelling If told, put ice on the injured area. To do this: Put ice in a plastic bag. Place a towel between your skin and the bag. Leave the ice on for 20 minutes, 2-3 times a day. Take off the ice if your skin turns bright red. This is very important. If you cannot feel pain, heat, or cold,  you have a greater risk of damage to the area. Take over-the-counter and prescription medicines only as told by your doctor. Activity Avoid activities that cause pain to the injured area. Protect your injured area. Slowly increase activity as told by your doctor. General instructions Do deep breathing exercises as told by your doctor. You may be told to: Take deep breaths many times a day. Cough several times a day while hugging a pillow. Use a device (incentive spirometer) to do deep breathing many times a day. Drink enough fluid to keep your pee (urine) clear or pale yellow. Do not wear a rib belt or binder. Keep all follow-up visits. Contact a doctor if: You have a fever. Get help right away if: You have trouble breathing. You are short of breath. You cannot stop coughing. You cough up thick or bloody spit. You feel like you may vomit (nauseous), vomit, or have belly (abdominal) pain. Your pain gets worse and medicine does not help. These symptoms may be an emergency. Get help right away. Call your local emergency services (911 in the [place] Do not wait to see if the symptoms will go away. Do not drive yourself to the hospital. Summary A rib fracture is a break or crack in one of the bones of the ribs. Apply ice to the injured area and  take medicines for pain as told by your doctor. Take deep breaths and cough several times a day. Hug a pillow every time you cough. This information is not intended to replace advice given to you by your health care provider. Make sure you discuss any questions you have with your health care provider. Document Revised: 05/17/2023 Document Reviewed: 05/17/2023 Elsevier Patient Education  2024 Elsevier Inc.  Chest Wall Pain Chest wall pain is pain in or around the bones and muscles of your chest. Sometimes, an injury causes this pain. Excessive coughing or overuse of arm and chest muscles may also cause chest wall pain. Sometimes, the cause may not  be known. This pain may take several weeks or longer to get better. Follow these instructions at home: Managing pain, stiffness, and swelling  If directed, put ice on the painful area: Put ice in a plastic bag. Place a towel between your skin and the bag. Leave the ice on for 20 minutes, 2-3 times per day. Activity Rest as told by your health care provider. Avoid activities that cause pain. These include any activities that use your chest muscles or your abdominal and side muscles to lift heavy items. Ask your health care provider what activities are safe for you. General instructions  Take over-the-counter and prescription medicines only as told by your health care provider. Do not use any products that contain nicotine or tobacco, such as cigarettes, e-cigarettes, and chewing tobacco. These can delay healing after injury. If you need help quitting, ask your health care provider. Keep all follow-up visits as told by your health care provider. This is important. Contact a health care provider if: You have a fever. Your chest pain becomes worse. You have new symptoms. Get help right away if: You have nausea or vomiting. You feel sweaty or light-headed. You have a cough with mucus from your lungs (sputum) or you cough up blood. You develop shortness of breath. These symptoms may represent a serious problem that is an emergency. Do not wait to see if the symptoms will go away. Get medical help right away. Call your local emergency services (911 in the [place] Do not drive yourself to the hospital. Summary Chest wall pain is pain in or around the bones and muscles of your chest. Depending on the cause, it may be treated with ice, rest, medicines, and avoiding activities that cause pain. Contact a health care provider if you have a fever, worsening chest pain, or new symptoms. Get help right away if you feel light-headed or you develop shortness of breath. These symptoms may be an  emergency. This information is not intended to replace advice given to you by your health care provider. Make sure you discuss any questions you have with your health care provider. Document Revised: 06/22/2022 Document Reviewed: 06/22/2022 Elsevier Patient Education  2024 [employer].

## 2023-12-09 ENCOUNTER — Ambulatory Visit: Payer: Self-pay | Admitting: Family Medicine

## 2023-12-15 DIAGNOSIS — R1084 Generalized abdominal pain: Secondary | ICD-10-CM | POA: Diagnosis not present

## 2023-12-22 ENCOUNTER — Ambulatory Visit: Payer: Medicare Other | Admitting: Neurology

## 2023-12-29 ENCOUNTER — Ambulatory Visit: Payer: Medicare Other | Admitting: Neurology

## 2024-01-11 DIAGNOSIS — R3911 Hesitancy of micturition: Secondary | ICD-10-CM | POA: Diagnosis not present

## 2024-01-11 DIAGNOSIS — R351 Nocturia: Secondary | ICD-10-CM | POA: Diagnosis not present

## 2024-01-11 DIAGNOSIS — R3912 Poor urinary stream: Secondary | ICD-10-CM | POA: Diagnosis not present

## 2024-01-11 DIAGNOSIS — N401 Enlarged prostate with lower urinary tract symptoms: Secondary | ICD-10-CM | POA: Diagnosis not present

## 2024-01-11 DIAGNOSIS — R3915 Urgency of urination: Secondary | ICD-10-CM | POA: Diagnosis not present

## 2024-01-25 DIAGNOSIS — F4323 Adjustment disorder with mixed anxiety and depressed mood: Secondary | ICD-10-CM | POA: Diagnosis not present

## 2024-02-10 ENCOUNTER — Ambulatory Visit: Admitting: Neurology

## 2024-02-14 DIAGNOSIS — F4323 Adjustment disorder with mixed anxiety and depressed mood: Secondary | ICD-10-CM | POA: Diagnosis not present

## 2024-02-21 DIAGNOSIS — F4323 Adjustment disorder with mixed anxiety and depressed mood: Secondary | ICD-10-CM | POA: Diagnosis not present

## 2024-02-23 ENCOUNTER — Ambulatory Visit: Payer: Medicare Other | Admitting: Family Medicine

## 2024-02-23 VITALS — BP 134/78 | HR 62 | Temp 98.3°F | Wt 157.0 lb

## 2024-02-23 DIAGNOSIS — N401 Enlarged prostate with lower urinary tract symptoms: Secondary | ICD-10-CM | POA: Diagnosis not present

## 2024-02-23 DIAGNOSIS — E782 Mixed hyperlipidemia: Secondary | ICD-10-CM

## 2024-02-23 DIAGNOSIS — M25511 Pain in right shoulder: Secondary | ICD-10-CM | POA: Diagnosis not present

## 2024-02-23 DIAGNOSIS — K5900 Constipation, unspecified: Secondary | ICD-10-CM | POA: Diagnosis not present

## 2024-02-23 DIAGNOSIS — G4731 Primary central sleep apnea: Secondary | ICD-10-CM

## 2024-02-23 DIAGNOSIS — R3914 Feeling of incomplete bladder emptying: Secondary | ICD-10-CM

## 2024-02-23 LAB — COMPREHENSIVE METABOLIC PANEL WITH GFR
ALT: 15 U/L (ref 0–53)
AST: 29 U/L (ref 0–37)
Albumin: 4.1 g/dL (ref 3.5–5.2)
Alkaline Phosphatase: 55 U/L (ref 39–117)
BUN: 16 mg/dL (ref 6–23)
CO2: 29 meq/L (ref 19–32)
Calcium: 9.2 mg/dL (ref 8.4–10.5)
Chloride: 100 meq/L (ref 96–112)
Creatinine, Ser: 1.29 mg/dL (ref 0.40–1.50)
GFR: 54.1 mL/min — ABNORMAL LOW (ref >60.00)
Glucose, Bld: 94 mg/dL (ref 70–99)
Potassium: 4.1 meq/L (ref 3.5–5.1)
Sodium: 135 meq/L (ref 135–145)
Total Bilirubin: 0.5 mg/dL (ref 0.2–1.2)
Total Protein: 6.3 g/dL (ref 6.0–8.3)

## 2024-02-23 LAB — LIPID PANEL
Cholesterol: 157 mg/dL (ref 0–200)
HDL: 79.8 mg/dL (ref >39.00)
LDL Cholesterol: 67 mg/dL (ref 0–99)
NonHDL: 77.08
Total CHOL/HDL Ratio: 2
Triglycerides: 51 mg/dL (ref 0.0–149.0)
VLDL: 10.2 mg/dL (ref 0.0–40.0)

## 2024-02-23 NOTE — Patient Instructions (Signed)
 I do suspect a possible tendinitis or inflammation, irritation of the muscles into the shoulder, specifically the biceps tendon but also some of your rotator cuff tendons may also be involved.  Try to adjust your activity and minimize some of the exercise or activities that cause for the pain in that area.  Range of motion throughout the day of the helpful.  Topical Voltaren gel okay temporarily, but I will refer you to one of my sports medicine colleagues for possible musculoskeletal ultrasound and injection.  Let me know if anything changes prior to that appointment.  No other medication changes today.  If any concerns on labs I will let you know.  Take care

## 2024-02-23 NOTE — Progress Notes (Signed)
 Subjective:  Patient ID: Anthony Juarez, male    DOB: December 28, 1945  Age: 78 y.o. MRN: [id number]  CC:  Chief Complaint  Patient presents with   Medical Management of Chronic Issues    6 month follow up from 08/18/2023 for constipation. Patient states he is having sorness in right shoulder.     HPI [name] presents for    Right shoulder pain Past month, no fall/injury. More use, sore to push lawnmower, vaccuming.  Tx: occasional ibuprofen. Some relief. About the same. Comes and goes depending on use.  No neck pain or arm radiation.  R hand dominant.  No prior r shoulder injury/surgery/injections.   Prior rib injury in May - improved in few weeks.   Neuro/sleep Followed by Dr. Chalice with history of central sleep apnea, using cpap- effective. Dr. Onita with seizure disorder, AVM of brain, on Namenda  with history of mild cognitive impairment, held off on [place] given brain bleeding risk with prior imaging abnormality.  Lamictal  200 mg extended release daily for seizure prophylaxis. No recent seizure.   Constipation Increase fluid intake, fiber supplement, MiraLAX  few days per week as of his February visit with still some persistent hard stools and straining.  Recommended daily Citrucel, MiraLAX  if needed, handout on foods that can contribute to bloating and recommended follow-up with gastroenterology. Saw Dr. Rollin. No change in plan. Doing better recently.   Hyperlipidemia: With history of CAD, liver stress testing in January 2024, Zetia  10 mg daily, Crestor  20 mg daily.  Denies any myalgias or side effects with current medication regimen. Lab Results  Component Value Date   CHOL 153 07/27/2022   HDL 74.40 07/27/2022   LDLCALC 67 07/27/2022   TRIG 60.0 07/27/2022   CHOLHDL 2 07/27/2022   Lab Results  Component Value Date   ALT 14 05/30/2023   AST 39 05/30/2023   ALKPHOS 46 05/30/2023   BILITOT 1.0 05/30/2023    BPH with incomplete bladder  emptying Followed by urology, was on tamsulosin and finasteride , doxazosin  was discontinued in February. Off doxazosin  - urinating ok. Uroflow few months ago. Doing well.    History Patient Active Problem List   Diagnosis Date Noted   Right flank pain 10/07/2023   Chest wall mass 12/04/2022   History of sleep apnea 12/01/2022   Nocturnal epilepsy (HCC) 12/01/2022   Reactive airways dysfunction syndrome (HCC) 12/01/2022   Family history of early CAD 05/05/2021   Mild cognitive impairment 06/27/2020   Chronic sinus bradycardia 07/09/2018   Abnormal bowel movement 07/09/2018   Hyperinflation of lungs 07/09/2018   Non-seasonal allergic rhinitis 07/09/2018   Abnormal finding on MRI of brain 12/04/2016   Benign prostatic hyperplasia with incomplete bladder emptying 11/11/2016   Mixed hyperlipidemia 05/13/2016   Esophageal reflux 03/28/2015   Shortness of breath 03/28/2015   Skin lesion of face 03/28/2015   AVM (arteriovenous malformation) brain 06/15/2013   Seizure disorder (HCC) 01/19/2013   Central sleep apnea 06/10/2012   Epigastric pain 06/06/2012   Gallstones 06/06/2012   Nausea 06/06/2012   Past Medical History:  Diagnosis Date   Allergy    Arthritis    Asthma    childhood   AVM (arteriovenous malformation) brain 06/15/2013   Balanitis    Benign prostatic hypertrophy    Central sleep apnea    Cholecystitis with cholelithiasis    Chronic sinus bradycardia 07/09/2018   Convulsive disorder (HCC)    Esophageal reflux 03/28/2015   History of nuclear stress test  Myoview 11/16: EF 60%, normal perfusion, Low Risk   Hyperlipidemia    Prostatitis    Seizures (HCC)    Past Surgical History:  Procedure Laterality Date   ELBOW SURGERY  11/11/2000   repair   FRACTURE SURGERY     LEFT ELBOW   TONSILLECTOMY     Allergies  Allergen Reactions   Penicillins Rash    Did it involve swelling of the face/tongue/throat, SOB, or low BP? No  Did it involve sudden or severe  rash/hives, skin peeling, or any reaction on the inside of your mouth or nose? Yes  Did you need to seek medical attention at a hospital or doctor's office? No  When did it last happen?  Decades ago.  If all above answers are "NO", may proceed with cephalosporin use.  Injectable- rash at injection site    Did it involve swelling of the face/tongue/throat, SOB, or low BP? No Did it involve sudden or severe rash/hives, skin peeling, or any reaction on the inside of your mouth or nose? Yes Did you need to seek medical attention at a hospital or doctor's office? No When did it last happen?  Decades ago. If all above answers are "NO", may proceed with cephalosporin use.   Prior to Admission medications   Medication Sig Start Date End Date Taking? Authorizing Provider  albuterol  (PROVENTIL  HFA;VENTOLIN  HFA) 108 (90 Base) MCG/ACT inhaler Inhale 2 puffs into the lungs every 4 (four) hours as needed for wheezing or shortness of breath (cough, shortness of breath or wheezing.). 06/01/17  Yes Loreli Elyn SAILOR, MD  aspirin  EC 81 MG tablet Take 81 mg by mouth daily.   Yes [provider]  Creatine Monohydrate POWD Take 5 g by mouth daily. 01/12/23  Yes [provider]  ezetimibe  (ZETIA ) 10 MG tablet Take 1 tablet (10 mg total) by mouth daily. 08/16/23  Yes Jeffrie Oneil BROCKS, MD  finasteride  (PROSCAR ) 5 MG tablet Take 1 tablet (5 mg total) by mouth daily. 01/28/23  Yes Levora Reyes SAUNDERS, MD  fluticasone  (FLONASE ) 50 MCG/ACT nasal spray USE 1 SPRAY IN EACH NOSTRIL TWICE A DAY. 03/17/22  Yes Levora Reyes SAUNDERS, MD  LamoTRIgine  200 MG TB24 24 hour tablet Take 1 tablet (200 mg total) by mouth at bedtime. 05/12/23  Yes Gayland Lauraine PARAS, NP  Melatonin 1 MG/ML LIQD Take 2 mg by mouth.   Yes [provider]  meloxicam  (MOBIC ) 7.5 MG tablet Take 1 tablet (7.5 mg total) by mouth daily as needed for pain. 12/08/23  Yes Levora Reyes SAUNDERS, MD  memantine  (NAMENDA ) 10 MG tablet Take 1 tablet (10 mg total) by mouth  2 (two) times daily. 08/12/23  Yes Onita Duos, MD  montelukast  (SINGULAIR ) 10 MG tablet TAKE ONE TABLET AT BEDTIME. 07/08/23  Yes Levora Reyes SAUNDERS, MD  Multiple Vitamins-Minerals (MULTIVITAMIN WITH MINERALS) tablet Take 1 tablet by mouth daily.   Yes [provider]  omeprazole  (PRILOSEC) 20 MG capsule Take 1 capsule (20 mg total) by mouth daily. 01/22/22  Yes Levora Reyes SAUNDERS, MD  rosuvastatin  (CRESTOR ) 20 MG tablet Take 1 tablet (20 mg total) by mouth daily. 08/16/23  Yes Jeffrie Oneil BROCKS, MD  tamsulosin (FLOMAX) 0.4 MG CAPS capsule Take 0.4 mg by mouth daily. 11/28/22  Yes [provider]  doxazosin  (CARDURA ) 2 MG tablet Take 1 tablet (2 mg total) by mouth daily. Patient not taking: Reported on 12/07/2023 01/28/23   Levora Reyes SAUNDERS, MD  MAGNESIUM  CHLORIDE-CALCIUM  PO Take 300 mg by  mouth daily. Patient not taking: Reported on 12/07/2023    [provider]   Social History   Socioeconomic History   Marital status: Married    Spouse name: [place]   Number of children: Not on file   Years of education: Not on file   Highest education level: Bachelor's degree (e.g., BA, AB, BS)  Occupational History   Occupation: retired    Associate Professor: RETIRED  Tobacco Use   Smoking status: Former    Current packs/day: 0.00    Average packs/day: 1 pack/day for 10.0 years (10.0 ttl pk-yrs)    Types: Cigarettes    Start date: 07/14/1967    Quit date: 07/13/1977    Years since quitting: 46.6   Smokeless tobacco: Never  Vaping Use   Vaping status: Never Used  Substance and Sexual Activity   Alcohol use: Yes    Alcohol/week: 2.0 standard drinks of alcohol    Types: 2 Cans of beer per week    Comment: occ   Drug use: No   Sexual activity: Not Currently    Birth control/protection: Post-menopausal, Abstinence  Other Topics Concern   Not on file  Social History Narrative   Patient worked in Engineer, materials.   Social Drivers of Corporate investment banker Strain: Low Risk   (02/19/2024)   Overall Financial Resource Strain (CARDIA)    Difficulty of Paying Living Expenses: Not hard at all  Food Insecurity: No Food Insecurity (02/19/2024)   Hunger Vital Sign    Worried About Running Out of Food in the Last Year: Never true    Ran Out of Food in the Last Year: Never true  Transportation Needs: No Transportation Needs (02/19/2024)   PRAPARE - Administrator, Civil Service (Medical): No    Lack of Transportation (Non-Medical): No  Physical Activity: Sufficiently Active (02/19/2024)   Exercise Vital Sign    Days of Exercise per Week: 3 days    Minutes of Exercise per Session: 120 min  Stress: No Stress Concern Present (02/19/2024)   [employer] of Occupational Health - Occupational Stress Questionnaire    Feeling of Stress: Not at all  Social Connections: Moderately Isolated (02/19/2024)   Social Connection and Isolation Panel    Frequency of Communication with Friends and Family: Never    Frequency of Social Gatherings with Friends and Family: Once a week    Attends Religious Services: Patient declined    Database administrator or Organizations: Yes    Attends Banker Meetings: 1 to 4 times per year    Marital Status: Married  Catering manager Violence: Not At Risk (10/21/2022)   Humiliation, Afraid, Rape, and Kick questionnaire    Fear of Current or Ex-Partner: No    Emotionally Abused: No    Physically Abused: No    Sexually Abused: No    Review of Systems  Constitutional:  Negative for fatigue and unexpected weight change.  Eyes:  Negative for visual disturbance.  Respiratory:  Negative for cough, chest tightness and shortness of breath.   Cardiovascular:  Negative for chest pain, palpitations and leg swelling.  Gastrointestinal:  Negative for abdominal pain and blood in stool.  Neurological:  Negative for dizziness, light-headedness and headaches.     Objective:   Vitals:   02/23/24 0847  BP: 134/78  Pulse: 62  Temp: 98.3  F (36.8 C)  TempSrc: Temporal  SpO2: 97%  Weight: 157 lb (71.2 kg)     Physical Exam Vitals  reviewed.  Constitutional:      Appearance: He is well-developed.  HENT:     Head: Normocephalic and atraumatic.  Neck:     Vascular: No carotid bruit or JVD.  Cardiovascular:     Rate and Rhythm: Normal rate and regular rhythm.     Heart sounds: Normal heart sounds. No murmur heard. Pulmonary:     Effort: Pulmonary effort is normal.     Breath sounds: Normal breath sounds. No rales.  Musculoskeletal:     Right lower leg: No edema.     Left lower leg: No edema.     Comments: C-spine, pain-free range of motion, does not reproduce shoulder pain.  No midline bony tenderness Right shoulder: Intact range of motion, discomfort with terminal flexion, internal rotation.  No focal bony tenderness.  Primarily tender over the anterior shoulder, bicipital groove, discomfort with speeds, yergasons, min discomfort with Neer's greater than Hawkins.  Minimal discomfort with empty can but no weakness.  Otherwise pain-free RTC testing.  Neurovascular intact distally.  Strength intact distally.   Skin:    General: Skin is warm and dry.  Neurological:     Mental Status: He is alert and oriented to person, place, and time.  Psychiatric:        Mood and Affect: Mood normal.        Assessment & Plan:  Lotus Gover is a 78 y.o. male . Acute pain of right shoulder - Plan: Ambulatory referral to Sports Medicine  - Based on exam may have a component of rotator cuff tendinosis, biceps tenosynovitis, as well as component of subacromial bursitis based on testing above.  May be helpful to further clarify this with musculoskeletal ultrasound, then determination of primary area involved and possible subsequent corticosteroid injection.  Will refer to sports medicine colleague who does perform MSK ultrasound for further treatment.  Topical Voltaren discussed temporarily with activity modification, range of  motion.  Constipation, unspecified constipation type  - Status post gastroenterology eval as above with improved symptoms recently, no changes.  Continue to monitor with RTC precautions  Benign prostatic hyperplasia with incomplete bladder emptying  - Stable with tamsulosin and finasteride .  No changes.  Mixed hyperlipidemia - Plan: Comprehensive metabolic panel with GFR, Lipid panel  - Tolerating combo of Zetia , Crestor .  Continue same.  Check labs and adjust regimen accordingly  Central sleep apnea  - Continue on CPAP, and follow-up with sleep specialist, neuro, along with continued use of lamotrigine  for seizure prophylaxis.  No orders of the defined types were placed in this encounter.  Patient Instructions  I do suspect a possible tendinitis or inflammation, irritation of the muscles into the shoulder, specifically the biceps tendon but also some of your rotator cuff tendons may also be involved.  Try to adjust your activity and minimize some of the exercise or activities that cause for the pain in that area.  Range of motion throughout the day of the helpful.  Topical Voltaren gel okay temporarily, but I will refer you to one of my sports medicine colleagues for possible musculoskeletal ultrasound and injection.  Let me know if anything changes prior to that appointment.  No other medication changes today.  If any concerns on labs I will let you know.  Take care    Signed,   Reyes Pines, MD [facility], [facility] 02/23/24 9:28 AM

## 2024-02-24 ENCOUNTER — Encounter: Payer: Self-pay | Admitting: Family Medicine

## 2024-02-25 ENCOUNTER — Ambulatory Visit: Payer: Self-pay | Admitting: Family Medicine

## 2024-03-07 DIAGNOSIS — F4323 Adjustment disorder with mixed anxiety and depressed mood: Secondary | ICD-10-CM | POA: Diagnosis not present

## 2024-03-08 DIAGNOSIS — L814 Other melanin hyperpigmentation: Secondary | ICD-10-CM | POA: Diagnosis not present

## 2024-03-08 DIAGNOSIS — D225 Melanocytic nevi of trunk: Secondary | ICD-10-CM | POA: Diagnosis not present

## 2024-03-08 DIAGNOSIS — D485 Neoplasm of uncertain behavior of skin: Secondary | ICD-10-CM | POA: Diagnosis not present

## 2024-03-08 DIAGNOSIS — D235 Other benign neoplasm of skin of trunk: Secondary | ICD-10-CM | POA: Diagnosis not present

## 2024-03-08 DIAGNOSIS — L570 Actinic keratosis: Secondary | ICD-10-CM | POA: Diagnosis not present

## 2024-03-08 DIAGNOSIS — L821 Other seborrheic keratosis: Secondary | ICD-10-CM | POA: Diagnosis not present

## 2024-03-14 NOTE — Progress Notes (Unsigned)
   LILLETTE Ileana Collet, PhD, LAT, ATC acting as a scribe for Artist Lloyd, MD.  Anthony Juarez is a 78 y.o. male who presents to [facility] today for R shoulder pain ongoing for about 6-7 wks. No falls or injury. Pain is somewhat improved. Pt locates pain to the anterior aspect of his R shoulder (around the biceps tendon).   Pt is RHD.  Radiates: no Aggravates: increased use, lawn mowing, vacuuming,  Treatments tried: IBU  Pertinent review of systems: No fevers or chills  Relevant historical information: Reactive airway disease.  History of a seizure disorder.   Exam:  BP (!) 152/90   Pulse (!) 52   Ht 5' 9 (1.753 m)   Wt 159 lb (72.1 kg)   SpO2 98%   BMI 23.48 kg/m  General: Well Developed, well nourished, and in no acute distress.   MSK: Right shoulder normal-appearing well-developed musculature. Normal shoulder motion. Pain with abduction and functional internal rotation. Intact strength. Positive Hawkins and Neer's test.    Lab and Radiology Results  Diagnostic Limited MSK Ultrasound of: Right shoulder Biceps tendon is intact and normal-appearing Subscapularis tendon is intact and normal-appearing Supraspinatus tendon is intact with hypoechoic fluid superficial to the supraspinatus tendon consistent with bursitis.  A small nonretracted bursal surface tear is possible but less likely. Infraspinatus tendon is intact and normal-appearing AC joint degenerative appearing Impression: Subacromial bursitis  X-ray images right shoulder obtained today personally and independently interpreted. Severe AC DJD.  Mild glenohumeral DJD.  No acute fractures are visible. Await formal radiology   Assessment and Plan: 78 y.o. male with chronic right shoulder pain due primarily to subacromial impingement and bursitis.  There may be some component of AC DJD as well.  Plan for physical therapy referral.  We discussed return to lifting protocol.  Recheck  in 2 months.  Okay to cancel if feeling better.   PDMP not reviewed this encounter. Orders Placed This Encounter  Procedures   US  LIMITED JOINT SPACE STRUCTURES UP RIGHT(NO LINKED CHARGES)    Reason for Exam (SYMPTOM  OR DIAGNOSIS REQUIRED):   right shoulder pain    Preferred imaging location?:   [facility] Shoulder Right    Standing Status:   Future    Number of Occurrences:   1    Expiration Date:   03/15/2025    Reason for Exam (SYMPTOM  OR DIAGNOSIS REQUIRED):   right shoulder pain    Preferred imaging location?:   [facility]   Ambulatory referral to Physical Therapy    Referral Priority:   Routine    Referral Type:   Physical Medicine    Referral Reason:   Specialty Services Required    Requested Specialty:   Physical Therapy    Number of Visits Requested:   1   No orders of the defined types were placed in this encounter.    Discussed warning signs or symptoms. Please see discharge instructions. Patient expresses understanding.   The above documentation has been reviewed and is accurate and complete Artist Lloyd, M.D.

## 2024-03-15 ENCOUNTER — Ambulatory Visit (INDEPENDENT_AMBULATORY_CARE_PROVIDER_SITE_OTHER)

## 2024-03-15 ENCOUNTER — Other Ambulatory Visit: Payer: Self-pay

## 2024-03-15 ENCOUNTER — Ambulatory Visit (INDEPENDENT_AMBULATORY_CARE_PROVIDER_SITE_OTHER): Admitting: Family Medicine

## 2024-03-15 VITALS — BP 152/90 | HR 52 | Ht 69.0 in | Wt 159.0 lb

## 2024-03-15 DIAGNOSIS — M25511 Pain in right shoulder: Secondary | ICD-10-CM

## 2024-03-15 DIAGNOSIS — G8929 Other chronic pain: Secondary | ICD-10-CM | POA: Diagnosis not present

## 2024-03-15 DIAGNOSIS — M19011 Primary osteoarthritis, right shoulder: Secondary | ICD-10-CM | POA: Diagnosis not present

## 2024-03-15 NOTE — Patient Instructions (Addendum)
 Thank you for coming in today.   Please get an Xray today before you leave   I've referred you to Physical Therapy here, at this office.  You will hear from our office soon about scheduling, once we check with your insurance company.   Check back in 2 months

## 2024-03-19 ENCOUNTER — Other Ambulatory Visit: Payer: Self-pay | Admitting: Family Medicine

## 2024-03-19 DIAGNOSIS — N401 Enlarged prostate with lower urinary tract symptoms: Secondary | ICD-10-CM

## 2024-03-24 ENCOUNTER — Ambulatory Visit: Payer: Self-pay | Admitting: Family Medicine

## 2024-03-24 NOTE — Progress Notes (Signed)
 Right shoulder x-ray shows severe arthritis of the small joint at the top of the shoulder and the potential for the rotator cuff tendon to be pinched.  The main shoulder joint does not have significant arthritis.

## 2024-03-29 ENCOUNTER — Ambulatory Visit (INDEPENDENT_AMBULATORY_CARE_PROVIDER_SITE_OTHER): Admitting: Physical Therapy

## 2024-03-29 ENCOUNTER — Other Ambulatory Visit: Payer: Self-pay

## 2024-03-29 ENCOUNTER — Encounter: Payer: Self-pay | Admitting: Physical Therapy

## 2024-03-29 DIAGNOSIS — M6281 Muscle weakness (generalized): Secondary | ICD-10-CM

## 2024-03-29 DIAGNOSIS — M25511 Pain in right shoulder: Secondary | ICD-10-CM

## 2024-03-29 DIAGNOSIS — G8929 Other chronic pain: Secondary | ICD-10-CM | POA: Diagnosis not present

## 2024-03-29 NOTE — Patient Instructions (Signed)
 Access Code: [email or web address] URL: [email or web address] Date: 03/29/2024 Prepared by: Elaine Daring  Exercises - Standing Single Arm Row with Resistance Thumb Up  - 3 x weekly - 3 sets - 15 reps - Shoulder External Rotation with Anchored Resistance  - 3 x weekly - 3 sets - 10 reps - Single Arm Scaption with Resistance  - 3 x weekly - 3 sets - 10 reps

## 2024-03-29 NOTE — Therapy (Signed)
 OUTPATIENT PHYSICAL THERAPY EVALUATION   Patient Name: Anthony Juarez MRN: [id number] DOB:1945-11-29, 78 y.o., male Today's Date: 03/29/2024   END OF SESSION:  PT End of Session - 03/29/24 1159     Visit Number 1    Number of Visits 9    Date for PT Re-Evaluation 05/24/24    Authorization Type MCR    Progress Note Due on Visit 10    PT Start Time 1148    PT Stop Time 1230    PT Time Calculation (min) 42 min    Activity Tolerance Patient tolerated treatment well    Behavior During Therapy [facility] for tasks assessed/performed          Past Medical History:  Diagnosis Date   Allergy    Arthritis    Asthma    childhood   AVM (arteriovenous malformation) brain 06/15/2013   Balanitis    Benign prostatic hypertrophy    Central sleep apnea    Cholecystitis with cholelithiasis    Chronic sinus bradycardia 07/09/2018   Convulsive disorder (HCC)    Esophageal reflux 03/28/2015   History of nuclear stress test    Myoview 11/16: EF 60%, normal perfusion, Low Risk   Hyperlipidemia    Prostatitis    Seizures (HCC)    Past Surgical History:  Procedure Laterality Date   ELBOW SURGERY  11/11/2000   repair   FRACTURE SURGERY     LEFT ELBOW   TONSILLECTOMY     Patient Active Problem List   Diagnosis Date Noted   Right flank pain 10/07/2023   Chest wall mass 12/04/2022   History of sleep apnea 12/01/2022   Nocturnal epilepsy (HCC) 12/01/2022   Reactive airways dysfunction syndrome (HCC) 12/01/2022   Family history of early CAD 05/05/2021   Mild cognitive impairment 06/27/2020   Chronic sinus bradycardia 07/09/2018   Abnormal bowel movement 07/09/2018   Hyperinflation of lungs 07/09/2018   Non-seasonal allergic rhinitis 07/09/2018   Abnormal finding on MRI of brain 12/04/2016   Benign prostatic hyperplasia with incomplete bladder emptying 11/11/2016   Mixed hyperlipidemia 05/13/2016   Esophageal reflux 03/28/2015   Shortness of breath 03/28/2015   Skin lesion of face  03/28/2015   AVM (arteriovenous malformation) brain 06/15/2013   Seizure disorder (HCC) 01/19/2013   Central sleep apnea 06/10/2012   Epigastric pain 06/06/2012   Gallstones 06/06/2012   Nausea 06/06/2012    PCP: Levora Reyes SAUNDERS, MD  REFERRING PROVIDER: Joane Artist RAMAN, MD  REFERRING DIAG: Chronic right shoulder pain  THERAPY DIAG:  Chronic right shoulder pain  Muscle weakness (generalized)  Rationale for Evaluation and Treatment: Rehabilitation  ONSET DATE: Chronic, July 2025   SUBJECTIVE SUBJECTIVE STATEMENT: Patient reports right shoulder pain that happened in July from probably overuse. The pain has improved and he can feel a click in the shoulder. He hasn't exercised in a while because of his shoulder pain, and he doesn't want to make it worse doing some upper body exercises.    Hand dominance: Right  PERTINENT HISTORY: See PMH above  PAIN:  Are you having pain? Yes:  NPRS scale: 1-2/10 currently, 4-5/10 at worst Pain location: Right shoulder Pain description: Soreness Aggravating factors: Sitting for long periods, raising arm up or lifting overhead Relieving factors: Rest, ice, patches  PRECAUTIONS: None  RED FLAGS: None   WEIGHT BEARING RESTRICTIONS: No  FALLS:  Has patient fallen in last 6 months? No  PLOF: Independent  PATIENT GOALS: Be able to exercise upper body without worsening shoulder  pain   OBJECTIVE:  Note: Objective measures were completed at Evaluation unless otherwise noted. PATIENT SURVEYS:  QuickDASH: 15.9% disability  COGNITION: Overall cognitive status: Within functional limits for tasks assessed     SENSATION: WFL  POSTURE: Slight rounded shoulder posture  UPPER EXTREMITY ROM:   Active ROM Right eval Left eval  Shoulder flexion 155 145  Shoulder extension    Shoulder abduction 165 160  Shoulder adduction    Shoulder internal rotation T8 T5  Shoulder external rotation 70 / T2 70 / T2  Elbow flexion    Elbow  extension    Wrist flexion    Wrist extension    Wrist ulnar deviation    Wrist radial deviation    Wrist pronation    Wrist supination    (Blank rows = not tested)  Patient reports pain primarily with reaching behind his back on the right  UPPER EXTREMITY MMT:  MMT Right eval Left eval  Shoulder flexion 5 5  Shoulder scaption 4 5  Shoulder extension    Shoulder abduction 4 5  Shoulder adduction    Shoulder internal rotation 5 5  Shoulder external rotation 4+ 5  Middle trapezius    Lower trapezius    Elbow flexion 5 5  Elbow extension    Wrist flexion    Wrist extension    Wrist ulnar deviation    Wrist radial deviation    Wrist pronation    Wrist supination    Grip strength (lbs)    (Blank rows = not tested)  JOINT MOBILITY TESTING:  Right GHJ hypomobility  PALPATION:  Tender to palpation lateral peripatellar region                                                                                                                             TREATMENT [facility] Adult PT Treatment:                                                DATE: 03/29/2024 Single arm row with green x 15 Shoulder ER with green x 10 Scaption with green x 10  Discussed shoulder and rotator cuff anatomy, subacromial pain syndrome consisting of like rotator cuff tendinopathy and/or bursitis. Discussed gradual progression of weight exercises, especially overhead, until improved tolerance  PATIENT EDUCATION: Education details: Exam findings, POC, HEP Person educated: Patient Education method: Explanation, Demonstration, Tactile cues, Verbal cues, and Handouts Education comprehension: verbalized understanding, returned demonstration, verbal cues required, tactile cues required, and needs further education  HOME EXERCISE PROGRAM: Access Code: [email or web address]    ASSESSMENT: CLINICAL IMPRESSION: Patient is a 78 y.o. male who was seen today for physical therapy evaluation and treatment for chronic right  shoulder pain. His symptoms are consistent with subacromial pain syndrome with likely rotator cuff tendinopathy. He exhibits primarily limitations with lifting overhead  or reaching behind his back that are impacting his functional ability.   OBJECTIVE IMPAIRMENTS: decreased activity tolerance, decreased ROM, decreased strength, postural dysfunction, and pain.   ACTIVITY LIMITATIONS: lifting, sitting, and reach over head  PARTICIPATION LIMITATIONS: community activity  PERSONAL FACTORS: Time since onset of injury/illness/exacerbation are also affecting patient's functional outcome.   REHAB POTENTIAL: Good  CLINICAL DECISION MAKING: Stable/uncomplicated  EVALUATION COMPLEXITY: Low   GOALS: Goals reviewed with patient? Yes  SHORT TERM GOALS: Target date: 04/26/2024  Patient will be I with initial HEP in order to progress with therapy. Baseline: HEP provided at eval Goal status: INITIAL  2.  Patient will report with lifting light weights overhead </= 2/10 in order to reduce functional limitations Baseline: 4-5/10 pain Goal status: INITIAL  LONG TERM GOALS: Target date: 05/24/2024  Patient will be I with final HEP to maintain progress from PT. Baseline: HEP provided at eval Goal status: INITIAL  2.  Patient will report QuickDASH </= 5% in order to indicate an improvement in their functional status Baseline: 15.9% disability Goal status: INITIAL  3.  Patient will demonstrate right shoulder strength 5/5 MMT to improve his activity tolerance and reduce pain with lifting Baseline: see limitations above Goal status: INITIAL  4.  Patient will demonstrate right shoulder AROM without pain or limitation to reduce functional limitations  Baseline: see limitations above Goal status: INITIAL   PLAN: PT FREQUENCY: 1x/week  PT DURATION: 8 weeks  PLANNED INTERVENTIONS: 97164- PT Re-evaluation, 97750- Physical Performance Testing, 97110-Therapeutic exercises, 97530- Therapeutic  activity, 97112- Neuromuscular re-education, 97535- Self Care, 02859- Manual therapy, 20560 (1-2 muscles), 20561 (3+ muscles)- Dry Needling, Patient/Family education, Taping, Joint mobilization, Joint manipulation, Spinal manipulation, Spinal mobilization, Cryotherapy, and Moist heat  PLAN FOR NEXT SESSION: Review HEP and progress PRN, manual/mobs for right shoulder, posterior cuff stretching, progress rotator cuff and periscapular strengthening, gradually progress overhead activity    Elaine Daring, PT, DPT, LAT, ATC 03/29/24  12:51 PM Phone: [phone] Fax: [phone]

## 2024-04-04 ENCOUNTER — Encounter: Payer: Self-pay | Admitting: Physical Therapy

## 2024-04-04 ENCOUNTER — Other Ambulatory Visit: Payer: Self-pay

## 2024-04-04 ENCOUNTER — Ambulatory Visit (INDEPENDENT_AMBULATORY_CARE_PROVIDER_SITE_OTHER): Admitting: Physical Therapy

## 2024-04-04 DIAGNOSIS — G8929 Other chronic pain: Secondary | ICD-10-CM

## 2024-04-04 DIAGNOSIS — M25511 Pain in right shoulder: Secondary | ICD-10-CM | POA: Diagnosis not present

## 2024-04-04 DIAGNOSIS — M6281 Muscle weakness (generalized): Secondary | ICD-10-CM | POA: Diagnosis not present

## 2024-04-04 DIAGNOSIS — F4323 Adjustment disorder with mixed anxiety and depressed mood: Secondary | ICD-10-CM | POA: Diagnosis not present

## 2024-04-04 NOTE — Therapy (Signed)
 OUTPATIENT PHYSICAL THERAPY TREATMENT   Patient Name: Anthony Juarez MRN: [id number] DOB:12/01/1945, 78 y.o., male Today's Date: 04/04/2024   END OF SESSION:  PT End of Session - 04/04/24 1438     Visit Number 2    Number of Visits 9    Date for Recertification  05/24/24    Authorization Type MCR    Progress Note Due on Visit 10    PT Start Time 1433    PT Stop Time 1513    PT Time Calculation (min) 40 min    Activity Tolerance Patient tolerated treatment well    Behavior During Therapy [facility] for tasks assessed/performed           Past Medical History:  Diagnosis Date   Allergy    Arthritis    Asthma    childhood   AVM (arteriovenous malformation) brain 06/15/2013   Balanitis    Benign prostatic hypertrophy    Central sleep apnea    Cholecystitis with cholelithiasis    Chronic sinus bradycardia 07/09/2018   Convulsive disorder (HCC)    Esophageal reflux 03/28/2015   History of nuclear stress test    Myoview 11/16: EF 60%, normal perfusion, Low Risk   Hyperlipidemia    Prostatitis    Seizures (HCC)    Past Surgical History:  Procedure Laterality Date   ELBOW SURGERY  11/11/2000   repair   FRACTURE SURGERY     LEFT ELBOW   TONSILLECTOMY     Patient Active Problem List   Diagnosis Date Noted   Right flank pain 10/07/2023   Chest wall mass 12/04/2022   History of sleep apnea 12/01/2022   Nocturnal epilepsy (HCC) 12/01/2022   Reactive airways dysfunction syndrome (HCC) 12/01/2022   Family history of early CAD 05/05/2021   Mild cognitive impairment 06/27/2020   Chronic sinus bradycardia 07/09/2018   Abnormal bowel movement 07/09/2018   Hyperinflation of lungs 07/09/2018   Non-seasonal allergic rhinitis 07/09/2018   Abnormal finding on MRI of brain 12/04/2016   Benign prostatic hyperplasia with incomplete bladder emptying 11/11/2016   Mixed hyperlipidemia 05/13/2016   Esophageal reflux 03/28/2015   Shortness of breath 03/28/2015   Skin lesion of face  03/28/2015   AVM (arteriovenous malformation) brain 06/15/2013   Seizure disorder (HCC) 01/19/2013   Central sleep apnea 06/10/2012   Epigastric pain 06/06/2012   Gallstones 06/06/2012   Nausea 06/06/2012    PCP: Levora Reyes SAUNDERS, MD  REFERRING PROVIDER: Joane Artist RAMAN, MD  REFERRING DIAG: Chronic right shoulder pain  THERAPY DIAG:  Chronic right shoulder pain  Muscle weakness (generalized)  Rationale for Evaluation and Treatment: Rehabilitation  ONSET DATE: Chronic, July 2025   SUBJECTIVE SUBJECTIVE STATEMENT: Patient reports he is doing well, is consistent with his exercises.  Eval: Patient reports right shoulder pain that happened in July from probably overuse. The pain has improved and he can feel a click in the shoulder. He hasn't exercised in a while because of his shoulder pain, and he doesn't want to make it worse doing some upper body exercises.    Hand dominance: Right  PERTINENT HISTORY: See PMH above  PAIN:  Are you having pain? Yes:  NPRS scale: 1/10 currently, 4-5/10 at worst Pain location: Right shoulder Pain description: Soreness Aggravating factors: Sitting for long periods, raising arm up or lifting overhead Relieving factors: Rest, ice, patches  PRECAUTIONS: None  PATIENT GOALS: Be able to exercise upper body without worsening shoulder pain   OBJECTIVE:  Note: Objective measures were completed  at Evaluation unless otherwise noted. PATIENT SURVEYS:  QuickDASH: 15.9% disability  POSTURE: Slight rounded shoulder posture  UPPER EXTREMITY ROM:   Active ROM Right eval Left eval  Shoulder flexion 155 145  Shoulder extension    Shoulder abduction 165 160  Shoulder adduction    Shoulder internal rotation T8 T5  Shoulder external rotation 70 / T2 70 / T2  Elbow flexion    Elbow extension    Wrist flexion    Wrist extension    Wrist ulnar deviation    Wrist radial deviation    Wrist pronation    Wrist supination    (Blank rows = not  tested)  Patient reports pain primarily with reaching behind his back on the right  UPPER EXTREMITY MMT:  MMT Right eval Left eval Right 04/04/2024  Shoulder flexion 5 5   Shoulder scaption 4 5 4+  Shoulder extension     Shoulder abduction 4 5   Shoulder adduction     Shoulder internal rotation 5 5   Shoulder external rotation 4+ 5 4+  Middle trapezius     Lower trapezius     Elbow flexion 5 5   Elbow extension     Wrist flexion     Wrist extension     Wrist ulnar deviation     Wrist radial deviation     Wrist pronation     Wrist supination     Grip strength (lbs)     (Blank rows = not tested)  JOINT MOBILITY TESTING:  Right GHJ hypomobility  PALPATION:  Tender to palpation lateral peripatellar region                                                                                                                             TREATMENT [facility] Adult PT Treatment:                                                DATE: 04/04/2024 UBE L3 x 4 min (fwd/bwd) to improve endurance and workload capacity Supine serratus punch with 10# 3 x 15 each Sidelying shoulder flexion with 3# 3 x 10 Sidelying shoulder ER with 3# 3 x 15 Prone I, T, Y 2 x 10 Standing scaption with 3# 3 x 10 Bent over row supported on table with 20# 3 x 10  Discussed various gym exercises and progressions  PATIENT EDUCATION: Education details: HEP Person educated: Patient Education method: Programmer, multimedia, Demonstration, Tactile cues, Verbal cues, and Handouts Education comprehension: verbalized understanding, returned demonstration, verbal cues required, tactile cues required, and needs further education  HOME EXERCISE PROGRAM: Access Code: [email or web address]    ASSESSMENT: CLINICAL IMPRESSION: Patient tolerated therapy well with no adverse effects. Therapy focused primarily on strengthening for the rotator cuff and periscapular musculature with good tolerance. He was able to progress his strengthening  exercises in  therapy and but did require frequent cueing for postural control to avoid scapula shrug and ensure proper mechanics. No changes to HEP but did discuss gym exercises and progressions. Patient would benefit from continued skilled PT to progress mobility and strength in order to reduce pain and maximize functional ability.   Eval: Patient is a 78 y.o. male who was seen today for physical therapy evaluation and treatment for chronic right shoulder pain. His symptoms are consistent with subacromial pain syndrome with likely rotator cuff tendinopathy. He exhibits primarily limitations with lifting overhead or reaching behind his back that are impacting his functional ability.   OBJECTIVE IMPAIRMENTS: decreased activity tolerance, decreased ROM, decreased strength, postural dysfunction, and pain.   ACTIVITY LIMITATIONS: lifting, sitting, and reach over head  PARTICIPATION LIMITATIONS: community activity  PERSONAL FACTORS: Time since onset of injury/illness/exacerbation are also affecting patient's functional outcome.    GOALS: Goals reviewed with patient? Yes  SHORT TERM GOALS: Target date: 04/26/2024  Patient will be I with initial HEP in order to progress with therapy. Baseline: HEP provided at eval Goal status: INITIAL  2.  Patient will report with lifting light weights overhead </= 2/10 in order to reduce functional limitations Baseline: 4-5/10 pain Goal status: INITIAL  LONG TERM GOALS: Target date: 05/24/2024  Patient will be I with final HEP to maintain progress from PT. Baseline: HEP provided at eval Goal status: INITIAL  2.  Patient will report QuickDASH </= 5% in order to indicate an improvement in their functional status Baseline: 15.9% disability Goal status: INITIAL  3.  Patient will demonstrate right shoulder strength 5/5 MMT to improve his activity tolerance and reduce pain with lifting Baseline: see limitations above Goal status: INITIAL  4.  Patient will demonstrate  right shoulder AROM without pain or limitation to reduce functional limitations  Baseline: see limitations above Goal status: INITIAL   PLAN: PT FREQUENCY: 1x/week  PT DURATION: 8 weeks  PLANNED INTERVENTIONS: 97164- PT Re-evaluation, 97750- Physical Performance Testing, 97110-Therapeutic exercises, 97530- Therapeutic activity, 97112- Neuromuscular re-education, 97535- Self Care, 02859- Manual therapy, 20560 (1-2 muscles), 20561 (3+ muscles)- Dry Needling, Patient/Family education, Taping, Joint mobilization, Joint manipulation, Spinal manipulation, Spinal mobilization, Cryotherapy, and Moist heat  PLAN FOR NEXT SESSION: Review HEP and progress PRN, manual/mobs for right shoulder, posterior cuff stretching, progress rotator cuff and periscapular strengthening, gradually progress overhead activity    Elaine Daring, PT, DPT, LAT, ATC 04/04/24  3:18 PM Phone: [phone] Fax: [phone]

## 2024-04-13 ENCOUNTER — Encounter: Admitting: Physical Therapy

## 2024-04-14 ENCOUNTER — Ambulatory Visit (INDEPENDENT_AMBULATORY_CARE_PROVIDER_SITE_OTHER): Admitting: Physical Therapy

## 2024-04-14 ENCOUNTER — Encounter: Payer: Self-pay | Admitting: Physical Therapy

## 2024-04-14 ENCOUNTER — Other Ambulatory Visit: Payer: Self-pay

## 2024-04-14 DIAGNOSIS — M6281 Muscle weakness (generalized): Secondary | ICD-10-CM | POA: Diagnosis not present

## 2024-04-14 DIAGNOSIS — M25511 Pain in right shoulder: Secondary | ICD-10-CM | POA: Diagnosis not present

## 2024-04-14 DIAGNOSIS — G8929 Other chronic pain: Secondary | ICD-10-CM | POA: Diagnosis not present

## 2024-04-14 NOTE — Therapy (Signed)
 OUTPATIENT PHYSICAL THERAPY TREATMENT   Patient Name: Anthony Juarez MRN: [id number] DOB:1945-08-13, 78 y.o., male Today's Date: 04/14/2024   END OF SESSION:  PT End of Session - 04/14/24 1145     Visit Number 3    Number of Visits 9    Date for Recertification  05/24/24    Authorization Type MCR    Progress Note Due on Visit 10    PT Start Time 1145    PT Stop Time 1225    PT Time Calculation (min) 40 min    Activity Tolerance Patient tolerated treatment well    Behavior During Therapy  [facility] for tasks assessed/performed            Past Medical History:  Diagnosis Date   Allergy    Arthritis    Asthma    childhood   AVM (arteriovenous malformation) brain 06/15/2013   Balanitis    Benign prostatic hypertrophy    Central sleep apnea    Cholecystitis with cholelithiasis    Chronic sinus bradycardia 07/09/2018   Convulsive disorder (HCC)    Esophageal reflux 03/28/2015   History of nuclear stress test    Myoview 11/16: EF 60%, normal perfusion, Low Risk   Hyperlipidemia    Prostatitis    Seizures (HCC)    Past Surgical History:  Procedure Laterality Date   ELBOW SURGERY  11/11/2000   repair   FRACTURE SURGERY     LEFT ELBOW   TONSILLECTOMY     Patient Active Problem List   Diagnosis Date Noted   Right flank pain 10/07/2023   Chest wall mass 12/04/2022   History of sleep apnea 12/01/2022   Nocturnal epilepsy (HCC) 12/01/2022   Reactive airways dysfunction syndrome (HCC) 12/01/2022   Family history of early CAD 05/05/2021   Mild cognitive impairment 06/27/2020   Chronic sinus bradycardia 07/09/2018   Abnormal bowel movement 07/09/2018   Hyperinflation of lungs 07/09/2018   Non-seasonal allergic rhinitis 07/09/2018   Abnormal finding on MRI of brain 12/04/2016   Benign prostatic hyperplasia with incomplete bladder emptying 11/11/2016   Mixed hyperlipidemia 05/13/2016   Esophageal reflux 03/28/2015   Shortness of breath 03/28/2015   Skin lesion of face  03/28/2015   AVM (arteriovenous malformation) brain 06/15/2013   Seizure disorder (HCC) 01/19/2013   Central sleep apnea 06/10/2012   Epigastric pain 06/06/2012   Gallstones 06/06/2012   Nausea 06/06/2012    PCP: Levora Reyes SAUNDERS, MD  REFERRING PROVIDER: Joane Artist RAMAN, MD  REFERRING DIAG: Chronic right shoulder pain  THERAPY DIAG:  Chronic right shoulder pain  Muscle weakness (generalized)  Rationale for Evaluation and Treatment: Rehabilitation  ONSET DATE: Chronic, July 2025   SUBJECTIVE SUBJECTIVE STATEMENT: Patient reports his shoulder is doing good. If he uses it for an extended period of time then it will be sore. If he lies on his back for extended period of time then that can aggravate the shoulder.   Eval: Patient reports right shoulder pain that happened in July from probably overuse. The pain has improved and he can feel a click in the shoulder. He hasn't exercised in a while because of his shoulder pain, and he doesn't want to make it worse doing some upper body exercises.    Hand dominance: Right  PERTINENT HISTORY: See PMH above  PAIN:  Are you having pain? Yes:  NPRS scale: 0/10 currently, 4-5/10 at worst Pain location: Right shoulder Pain description: Soreness Aggravating factors: Sitting for long periods, raising arm up or lifting overhead  Relieving factors: Rest, ice, patches  PRECAUTIONS: None  PATIENT GOALS: Be able to exercise upper body without worsening shoulder pain   OBJECTIVE:  Note: Objective measures were completed at Evaluation unless otherwise noted. PATIENT SURVEYS:  QuickDASH: 15.9% disability  POSTURE: Slight rounded shoulder posture  UPPER EXTREMITY ROM:   Active ROM Right eval Left eval  Shoulder flexion 155 145  Shoulder extension    Shoulder abduction 165 160  Shoulder adduction    Shoulder internal rotation T8 T5  Shoulder external rotation 70 / T2 70 / T2  Elbow flexion    Elbow extension    Wrist flexion     Wrist extension    Wrist ulnar deviation    Wrist radial deviation    Wrist pronation    Wrist supination    (Blank rows = not tested)  Patient reports pain primarily with reaching behind his back on the right  UPPER EXTREMITY MMT:  MMT Right eval Left eval Right 04/04/2024  Shoulder flexion 5 5   Shoulder scaption 4 5 4+  Shoulder extension     Shoulder abduction 4 5   Shoulder adduction     Shoulder internal rotation 5 5   Shoulder external rotation 4+ 5 4+  Middle trapezius     Lower trapezius     Elbow flexion 5 5   Elbow extension     Wrist flexion     Wrist extension     Wrist ulnar deviation     Wrist radial deviation     Wrist pronation     Wrist supination     Grip strength (lbs)     (Blank rows = not tested)  JOINT MOBILITY TESTING:  Right GHJ hypomobility  PALPATION:  Tender to palpation lateral peripatellar region                                                                                                                             TREATMENT [facility] Adult PT Treatment:                                                DATE: 04/14/2024 UBE L3 x 4 min (fwd/bwd) to improve endurance and workload capacity Inclined 45 deg scaption with 5# 3 x 12 Sidelying shoulder abduction with 5# 3 x 10 Seated serratus punch with black 3 x 10 each Serratus wall slide with yellow loop at wrists 2 x 10 Standing shoulder flexion with yellow loop at wrists 2 x 10 Bent over row supported on table with 20# 3 x 10 Wall ball circles cw/ccw 3 x 20 each  PATIENT EDUCATION: Education details: HEP Person educated: Patient Education method: Programmer, multimedia, Demonstration, Actor cues, Verbal cues, and Handouts Education comprehension: verbalized understanding, returned demonstration, verbal cues required, tactile cues required, and needs further education  HOME EXERCISE  PROGRAM: Access Code: [email or web address]    ASSESSMENT: CLINICAL IMPRESSION: Patient tolerated therapy well with no  adverse effects. Therapy continues to focus on progressing right rotator cuff and periscapular strength and endurance. He was able to progress with weighted resistance for rotator cuff strengthening and incorporated more serratus exercises this visit. Patient does report soreness with exercises but does note improvement in his symptoms. No changes made to his HEP this visit. Patient would benefit from continued skilled PT to progress mobility and strength in order to reduce pain and maximize functional ability.   Eval: Patient is a 78 y.o. male who was seen today for physical therapy evaluation and treatment for chronic right shoulder pain. His symptoms are consistent with subacromial pain syndrome with likely rotator cuff tendinopathy. He exhibits primarily limitations with lifting overhead or reaching behind his back that are impacting his functional ability.   OBJECTIVE IMPAIRMENTS: decreased activity tolerance, decreased ROM, decreased strength, postural dysfunction, and pain.   ACTIVITY LIMITATIONS: lifting, sitting, and reach over head  PARTICIPATION LIMITATIONS: community activity  PERSONAL FACTORS: Time since onset of injury/illness/exacerbation are also affecting patient's functional outcome.    GOALS: Goals reviewed with patient? Yes  SHORT TERM GOALS: Target date: 04/26/2024  Patient will be I with initial HEP in order to progress with therapy. Baseline: HEP provided at eval Goal status: INITIAL  2.  Patient will report with lifting light weights overhead </= 2/10 in order to reduce functional limitations Baseline: 4-5/10 pain Goal status: INITIAL  LONG TERM GOALS: Target date: 05/24/2024  Patient will be I with final HEP to maintain progress from PT. Baseline: HEP provided at eval Goal status: INITIAL  2.  Patient will report QuickDASH </= 5% in order to indicate an improvement in their functional status Baseline: 15.9% disability Goal status: INITIAL  3.  Patient  will demonstrate right shoulder strength 5/5 MMT to improve his activity tolerance and reduce pain with lifting Baseline: see limitations above Goal status: INITIAL  4.  Patient will demonstrate right shoulder AROM without pain or limitation to reduce functional limitations  Baseline: see limitations above Goal status: INITIAL   PLAN: PT FREQUENCY: 1x/week  PT DURATION: 8 weeks  PLANNED INTERVENTIONS: 97164- PT Re-evaluation, 97750- Physical Performance Testing, 97110-Therapeutic exercises, 97530- Therapeutic activity, 97112- Neuromuscular re-education, 97535- Self Care, 02859- Manual therapy, 20560 (1-2 muscles), 20561 (3+ muscles)- Dry Needling, Patient/Family education, Taping, Joint mobilization, Joint manipulation, Spinal manipulation, Spinal mobilization, Cryotherapy, and Moist heat  PLAN FOR NEXT SESSION: Review HEP and progress PRN, manual/mobs for right shoulder, posterior cuff stretching, progress rotator cuff and periscapular strengthening, gradually progress overhead activity    Elaine Daring, PT, DPT, LAT, ATC 04/14/24  12:42 PM Phone: [phone] Fax: [phone]

## 2024-04-19 ENCOUNTER — Encounter: Payer: Self-pay | Admitting: Physical Therapy

## 2024-04-19 ENCOUNTER — Ambulatory Visit (INDEPENDENT_AMBULATORY_CARE_PROVIDER_SITE_OTHER): Admitting: Physical Therapy

## 2024-04-19 ENCOUNTER — Other Ambulatory Visit: Payer: Self-pay

## 2024-04-19 DIAGNOSIS — G8929 Other chronic pain: Secondary | ICD-10-CM

## 2024-04-19 DIAGNOSIS — M25511 Pain in right shoulder: Secondary | ICD-10-CM | POA: Diagnosis not present

## 2024-04-19 DIAGNOSIS — M6281 Muscle weakness (generalized): Secondary | ICD-10-CM

## 2024-04-19 NOTE — Therapy (Signed)
 OUTPATIENT PHYSICAL THERAPY TREATMENT   Patient Name: Elizabeth Haff MRN: [id number] DOB:[birth year], 78 y.o., male Today's Date: 04/19/2024   END OF SESSION:  PT End of Session - 04/19/24 1104     Visit Number 4    Number of Visits 9    Date for Recertification  05/24/24    Authorization Type MCR    Progress Note Due on Visit 10    PT Start Time 1102    PT Stop Time 1140    PT Time Calculation (min) 38 min    Activity Tolerance Patient tolerated treatment well    Behavior During Therapy [facility] for tasks assessed/performed             Past Medical History:  Diagnosis Date   Allergy    Arthritis    Asthma    childhood   AVM (arteriovenous malformation) brain 06/15/2013   Balanitis    Benign prostatic hypertrophy    Central sleep apnea    Cholecystitis with cholelithiasis    Chronic sinus bradycardia 07/09/2018   Convulsive disorder (HCC)    Esophageal reflux 03/28/2015   History of nuclear stress test    Myoview 11/16: EF 60%, normal perfusion, Low Risk   Hyperlipidemia    Prostatitis    Seizures (HCC)    Past Surgical History:  Procedure Laterality Date   ELBOW SURGERY  11/11/2000   repair   FRACTURE SURGERY     LEFT ELBOW   TONSILLECTOMY     Patient Active Problem List   Diagnosis Date Noted   Right flank pain 10/07/2023   Chest wall mass 12/04/2022   History of sleep apnea 12/01/2022   Nocturnal epilepsy (HCC) 12/01/2022   Reactive airways dysfunction syndrome (HCC) 12/01/2022   Family history of early CAD 05/05/2021   Mild cognitive impairment 06/27/2020   Chronic sinus bradycardia 07/09/2018   Abnormal bowel movement 07/09/2018   Hyperinflation of lungs 07/09/2018   Non-seasonal allergic rhinitis 07/09/2018   Abnormal finding on MRI of brain 12/04/2016   Benign prostatic hyperplasia with incomplete bladder emptying 11/11/2016   Mixed hyperlipidemia 05/13/2016   Esophageal reflux 03/28/2015   Shortness of breath 03/28/2015   Skin lesion of  face 03/28/2015   AVM (arteriovenous malformation) brain 06/15/2013   Seizure disorder (HCC) 01/19/2013   Central sleep apnea 06/10/2012   Epigastric pain 06/06/2012   Gallstones 06/06/2012   Nausea 06/06/2012    PCP: Levora Reyes SAUNDERS, MD  REFERRING PROVIDER: Joane Artist RAMAN, MD  REFERRING DIAG: Chronic right shoulder pain  THERAPY DIAG:  Chronic right shoulder pain  Muscle weakness (generalized)  Rationale for Evaluation and Treatment: Rehabilitation  ONSET DATE: Chronic, July 2025   SUBJECTIVE SUBJECTIVE STATEMENT: Patient reports his shoulder is doing good. He is consistent with his HEP.  Eval: Patient reports right shoulder pain that happened in July from probably overuse. The pain has improved and he can feel a click in the shoulder. He hasn't exercised in a while because of his shoulder pain, and he doesn't want to make it worse doing some upper body exercises.    Hand dominance: Right  PERTINENT HISTORY: See PMH above  PAIN:  Are you having pain? Yes:  NPRS scale: 0/10 currently, 4-5/10 at worst Pain location: Right shoulder Pain description: Soreness Aggravating factors: Sitting for long periods, raising arm up or lifting overhead Relieving factors: Rest, ice, patches  PRECAUTIONS: None  PATIENT GOALS: Be able to exercise upper body without worsening shoulder pain   OBJECTIVE:  Note:  Objective measures were completed at Evaluation unless otherwise noted. PATIENT SURVEYS:  QuickDASH: 15.9% disability  POSTURE: Slight rounded shoulder posture  UPPER EXTREMITY ROM:   Active ROM Right eval Left eval  Shoulder flexion 155 145  Shoulder extension    Shoulder abduction 165 160  Shoulder adduction    Shoulder internal rotation T8 T5  Shoulder external rotation 70 / T2 70 / T2  Elbow flexion    Elbow extension    Wrist flexion    Wrist extension    Wrist ulnar deviation    Wrist radial deviation    Wrist pronation    Wrist supination    (Blank  rows = not tested)  Patient reports pain primarily with reaching behind his back on the right  UPPER EXTREMITY MMT:  MMT Right eval Left eval Right 04/04/2024 Right 04/19/2024  Shoulder flexion 5 5    Shoulder scaption 4 5 4+   Shoulder extension      Shoulder abduction 4 5    Shoulder adduction      Shoulder internal rotation 5 5    Shoulder external rotation 4+ 5 4+ 4+  Middle trapezius      Lower trapezius      Elbow flexion 5 5    Elbow extension      Wrist flexion      Wrist extension      Wrist ulnar deviation      Wrist radial deviation      Wrist pronation      Wrist supination      Grip strength (lbs)      (Blank rows = not tested)  JOINT MOBILITY TESTING:  Right GHJ hypomobility  PALPATION:  Tender to palpation lateral peripatellar region                                                                                                                             TREATMENT [facility] Adult PT Treatment:                                                DATE: 04/19/2024 UBE L3 x 4 min (fwd/bwd) to improve endurance and workload capacity Sidelying thoracic rotation x 10 Sleeper stretch 5 x 10 sec Corner pec stretch 3 x 15 sec Bent over row supported on table with 25# 3 x 10 Standing shoulder ER with green 2 x 15 Seated overhead shoulder press with 6# 3 x 10 Serratus wall slide with yellow loop at wrists 2 x 10 Alternating shoulder taps in tall plank from low table 3 x 10 each Inclined 45 deg shoulder scaption with yellow 3 x 10  PATIENT EDUCATION: Education details: HEP update Person educated: Patient Education method: Explanation, Demonstration, Tactile cues, Verbal cues, and Handouts Education comprehension: verbalized understanding, returned demonstration, verbal cues required, tactile cues  required, and needs further education  HOME EXERCISE PROGRAM: Access Code: [email or web address]    ASSESSMENT: CLINICAL IMPRESSION: Patient tolerated therapy well with no adverse  effects. Therapy continues to focus on improving shoulder mobility and progressing his strengthening with good tolerance. Incorporated pec and posterior cuff stretching this visit and continued progression to overhead strengthening. He did report greater limitation with sleeper stretch on the right and some pain at end range with overhead lifting. Overall he does seem to be progressing well with his strengthening and functional ability. Updated his HEP to incorporate stretching with good tolerance. Patient would benefit from continued skilled PT to progress mobility and strength in order to reduce pain and maximize functional ability.   Eval: Patient is a 78 y.o. male who was seen today for physical therapy evaluation and treatment for chronic right shoulder pain. His symptoms are consistent with subacromial pain syndrome with likely rotator cuff tendinopathy. He exhibits primarily limitations with lifting overhead or reaching behind his back that are impacting his functional ability.   OBJECTIVE IMPAIRMENTS: decreased activity tolerance, decreased ROM, decreased strength, postural dysfunction, and pain.   ACTIVITY LIMITATIONS: lifting, sitting, and reach over head  PARTICIPATION LIMITATIONS: community activity  PERSONAL FACTORS: Time since onset of injury/illness/exacerbation are also affecting patient's functional outcome.    GOALS: Goals reviewed with patient? Yes  SHORT TERM GOALS: Target date: 04/26/2024  Patient will be I with initial HEP in order to progress with therapy. Baseline: HEP provided at eval Goal status: INITIAL  2.  Patient will report with lifting light weights overhead </= 2/10 in order to reduce functional limitations Baseline: 4-5/10 pain Goal status: INITIAL  LONG TERM GOALS: Target date: 05/24/2024  Patient will be I with final HEP to maintain progress from PT. Baseline: HEP provided at eval Goal status: INITIAL  2.  Patient will report QuickDASH </= 5% in  order to indicate an improvement in their functional status Baseline: 15.9% disability Goal status: INITIAL  3.  Patient will demonstrate right shoulder strength 5/5 MMT to improve his activity tolerance and reduce pain with lifting Baseline: see limitations above Goal status: INITIAL  4.  Patient will demonstrate right shoulder AROM without pain or limitation to reduce functional limitations  Baseline: see limitations above Goal status: INITIAL   PLAN: PT FREQUENCY: 1x/week  PT DURATION: 8 weeks  PLANNED INTERVENTIONS: 97164- PT Re-evaluation, 97750- Physical Performance Testing, 97110-Therapeutic exercises, 97530- Therapeutic activity, 97112- Neuromuscular re-education, 97535- Self Care, 02859- Manual therapy, 20560 (1-2 muscles), 20561 (3+ muscles)- Dry Needling, Patient/Family education, Taping, Joint mobilization, Joint manipulation, Spinal manipulation, Spinal mobilization, Cryotherapy, and Moist heat  PLAN FOR NEXT SESSION: Review HEP and progress PRN, manual/mobs for right shoulder, posterior cuff stretching, progress rotator cuff and periscapular strengthening, gradually progress overhead activity    Elaine Daring, PT, DPT, LAT, ATC 04/19/24  11:53 AM Phone: [phone] Fax: [phone]

## 2024-04-19 NOTE — Patient Instructions (Signed)
 Access Code: [email or web address] URL: [email or web address] Date: 04/19/2024 Prepared by: Elaine Daring  Exercises - Standing Single Arm Row with Resistance Thumb Up  - 3 x weekly - 3 sets - 15 reps - Shoulder External Rotation with Anchored Resistance  - 3 x weekly - 3 sets - 10 reps - Single Arm Scaption with Resistance  - 3 x weekly - 3 sets - 10 reps - Doorway Pec Stretch at 90 Degrees Abduction  - 1 x daily - 3 reps - 20-30 seconds hold

## 2024-04-24 DIAGNOSIS — F4323 Adjustment disorder with mixed anxiety and depressed mood: Secondary | ICD-10-CM | POA: Diagnosis not present

## 2024-04-26 ENCOUNTER — Ambulatory Visit (INDEPENDENT_AMBULATORY_CARE_PROVIDER_SITE_OTHER): Admitting: Physical Therapy

## 2024-04-26 ENCOUNTER — Encounter: Payer: Self-pay | Admitting: Physical Therapy

## 2024-04-26 ENCOUNTER — Other Ambulatory Visit: Payer: Self-pay

## 2024-04-26 DIAGNOSIS — M25511 Pain in right shoulder: Secondary | ICD-10-CM

## 2024-04-26 DIAGNOSIS — G8929 Other chronic pain: Secondary | ICD-10-CM | POA: Diagnosis not present

## 2024-04-26 DIAGNOSIS — M6281 Muscle weakness (generalized): Secondary | ICD-10-CM | POA: Diagnosis not present

## 2024-04-26 NOTE — Patient Instructions (Signed)
 Access Code: [email or web address] URL: [email or web address] Date: 04/26/2024 Prepared by: Elaine Daring  Exercises - Standing Single Arm Row with Resistance Thumb Up  - 3 x weekly - 3 sets - 15 reps - Shoulder External Rotation with Anchored Resistance  - 3 x weekly - 3 sets - 10 reps - Single Arm Scaption with Resistance  - 3 x weekly - 3 sets - 10 reps - Doorway Pec Stretch at 90 Degrees Abduction  - 1 x daily - 3 reps - 20-30 seconds hold - Sleeper Stretch  - 1 x daily - 10 reps - 10 seconds hold

## 2024-04-26 NOTE — Therapy (Signed)
 OUTPATIENT PHYSICAL THERAPY TREATMENT   Patient Name: Anthony Juarez MRN: [id number] DOB:1945/12/16, 78 y.o., male Today's Date: 04/26/2024   END OF SESSION:  PT End of Session - 04/26/24 1156     Visit Number 5    Number of Visits 9    Date for Recertification  05/24/24    Authorization Type MCR    Progress Note Due on Visit 10    PT Start Time 1102    PT Stop Time 1145    PT Time Calculation (min) 43 min    Activity Tolerance Patient tolerated treatment well    Behavior During Therapy [facility] for tasks assessed/performed              Past Medical History:  Diagnosis Date   Allergy    Arthritis    Asthma    childhood   AVM (arteriovenous malformation) brain 06/15/2013   Balanitis    Benign prostatic hypertrophy    Central sleep apnea    Cholecystitis with cholelithiasis    Chronic sinus bradycardia 07/09/2018   Convulsive disorder (HCC)    Esophageal reflux 03/28/2015   History of nuclear stress test    Myoview 11/16: EF 60%, normal perfusion, Low Risk   Hyperlipidemia    Prostatitis    Seizures (HCC)    Past Surgical History:  Procedure Laterality Date   ELBOW SURGERY  11/11/2000   repair   FRACTURE SURGERY     LEFT ELBOW   TONSILLECTOMY     Patient Active Problem List   Diagnosis Date Noted   Right flank pain 10/07/2023   Chest wall mass 12/04/2022   History of sleep apnea 12/01/2022   Nocturnal epilepsy (HCC) 12/01/2022   Reactive airways dysfunction syndrome (HCC) 12/01/2022   Family history of early CAD 05/05/2021   Mild cognitive impairment 06/27/2020   Chronic sinus bradycardia 07/09/2018   Abnormal bowel movement 07/09/2018   Hyperinflation of lungs 07/09/2018   Non-seasonal allergic rhinitis 07/09/2018   Abnormal finding on MRI of brain 12/04/2016   Benign prostatic hyperplasia with incomplete bladder emptying 11/11/2016   Mixed hyperlipidemia 05/13/2016   Esophageal reflux 03/28/2015   Shortness of breath 03/28/2015   Skin lesion of  face 03/28/2015   AVM (arteriovenous malformation) brain 06/15/2013   Seizure disorder (HCC) 01/19/2013   Central sleep apnea 06/10/2012   Epigastric pain 06/06/2012   Gallstones 06/06/2012   Nausea 06/06/2012    PCP: Levora Reyes SAUNDERS, MD  REFERRING PROVIDER: Joane Artist RAMAN, MD  REFERRING DIAG: Chronic right shoulder pain  THERAPY DIAG:  Chronic right shoulder pain  Muscle weakness (generalized)  Rationale for Evaluation and Treatment: Rehabilitation  ONSET DATE: Chronic, July 2025   SUBJECTIVE SUBJECTIVE STATEMENT: Patient reports he has been going to the gym more. He still gets the soreness in the right shoulder.  Eval: Patient reports right shoulder pain that happened in July from probably overuse. The pain has improved and he can feel a click in the shoulder. He hasn't exercised in a while because of his shoulder pain, and he doesn't want to make it worse doing some upper body exercises.    Hand dominance: Right  PERTINENT HISTORY: See PMH above  PAIN:  Are you having pain? Yes:  NPRS scale: 0/10 currently, 3/10 at worst Pain location: Right shoulder Pain description: Soreness Aggravating factors: Sitting for long periods, raising arm up or lifting overhead Relieving factors: Rest, ice, patches  PRECAUTIONS: None  PATIENT GOALS: Be able to exercise upper body without worsening  shoulder pain   OBJECTIVE:  Note: Objective measures were completed at Evaluation unless otherwise noted. PATIENT SURVEYS:  QuickDASH: 15.9% disability  POSTURE: Slight rounded shoulder posture  UPPER EXTREMITY ROM:   Active ROM Right eval Left eval  Shoulder flexion 155 145  Shoulder extension    Shoulder abduction 165 160  Shoulder adduction    Shoulder internal rotation T8 T5  Shoulder external rotation 70 / T2 70 / T2  Elbow flexion    Elbow extension    Wrist flexion    Wrist extension    Wrist ulnar deviation    Wrist radial deviation    Wrist pronation     Wrist supination    (Blank rows = not tested)  Patient reports pain primarily with reaching behind his back on the right  UPPER EXTREMITY MMT:  MMT Right eval Left eval Right 04/04/2024 Right 04/19/2024 Right 04/26/2024  Shoulder flexion 5 5     Shoulder scaption 4 5 4+  4+  Shoulder extension       Shoulder abduction 4 5   4+  Shoulder adduction       Shoulder internal rotation 5 5     Shoulder external rotation 4+ 5 4+ 4+ 4  Middle trapezius       Lower trapezius       Elbow flexion 5 5     Elbow extension       Wrist flexion       Wrist extension       Wrist ulnar deviation       Wrist radial deviation       Wrist pronation       Wrist supination       Grip strength (lbs)       (Blank rows = not tested)  JOINT MOBILITY TESTING:  Right GHJ hypomobility  PALPATION:  Tender to palpation lateral peripatellar region                                                                                                                             TREATMENT [facility] Adult PT Treatment:                                                DATE: 04/26/2024 UBE L3 x 4 min (fwd/bwd) to improve endurance and workload capacity Right GHJ mobs primarily inferior and posterior at various ranges of elevation Sleeper stretch 10 x 10 sec Corner pec stretch 3 x 20 sec Supine serratus scoop with blue 2 x 20 Bent over row supported on table with 25# 3 x 10 Standing shoulder ER and scap retraction with blue 2 x 15 Standing scaption with 3# 2 x 15  Discussed anatomy of the rotator cuff and likely etiology of pain being related to supraspinatus tendinopathy   PATIENT EDUCATION: Education  details: HEP update Person educated: Patient Education method: Explanation, Demonstration, Tactile cues, Verbal cues, and Handouts Education comprehension: verbalized understanding, returned demonstration, verbal cues required, tactile cues required, and needs further education  HOME EXERCISE PROGRAM: Access  Code: [email or web address]    ASSESSMENT: CLINICAL IMPRESSION: Patient tolerated therapy well with no adverse effects. Therapy continues to focus on improving shoulder mobility and progressing his strengthening with good tolerance. Utilized manual therapy and continued with sleeper stretch for posterior cuff mobility. He reports most difficulty movement is reach behind his back. He does continue to exhibit some strength deficits of the right rotator cuff. Updated his HEP to include sleeper stretch. Patient would benefit from continued skilled PT to progress mobility and strength in order to reduce pain and maximize functional ability.   Eval: Patient is a 78 y.o. male who was seen today for physical therapy evaluation and treatment for chronic right shoulder pain. His symptoms are consistent with subacromial pain syndrome with likely rotator cuff tendinopathy. He exhibits primarily limitations with lifting overhead or reaching behind his back that are impacting his functional ability.   OBJECTIVE IMPAIRMENTS: decreased activity tolerance, decreased ROM, decreased strength, postural dysfunction, and pain.   ACTIVITY LIMITATIONS: lifting, sitting, and reach over head  PARTICIPATION LIMITATIONS: community activity  PERSONAL FACTORS: Time since onset of injury/illness/exacerbation are also affecting patient's functional outcome.    GOALS: Goals reviewed with patient? Yes  SHORT TERM GOALS: Target date: 04/26/2024  Patient will be I with initial HEP in order to progress with therapy. Baseline: HEP provided at eval 04/26/2024: independent Goal status: MET  2.  Patient will report with lifting light weights overhead </= 2/10 in order to reduce functional limitations Baseline: 4-5/10 pain 04/26/2024: 3/10 Goal status: ONGOING  LONG TERM GOALS: Target date: 05/24/2024  Patient will be I with final HEP to maintain progress from PT. Baseline: HEP provided at eval Goal status: INITIAL  2.  Patient  will report QuickDASH </= 5% in order to indicate an improvement in their functional status Baseline: 15.9% disability Goal status: INITIAL  3.  Patient will demonstrate right shoulder strength 5/5 MMT to improve his activity tolerance and reduce pain with lifting Baseline: see limitations above Goal status: INITIAL  4.  Patient will demonstrate right shoulder AROM without pain or limitation to reduce functional limitations  Baseline: see limitations above Goal status: INITIAL   PLAN: PT FREQUENCY: 1x/week  PT DURATION: 8 weeks  PLANNED INTERVENTIONS: 97164- PT Re-evaluation, 97750- Physical Performance Testing, 97110-Therapeutic exercises, 97530- Therapeutic activity, 97112- Neuromuscular re-education, 97535- Self Care, 02859- Manual therapy, 20560 (1-2 muscles), 20561 (3+ muscles)- Dry Needling, Patient/Family education, Taping, Joint mobilization, Joint manipulation, Spinal manipulation, Spinal mobilization, Cryotherapy, and Moist heat  PLAN FOR NEXT SESSION: Review HEP and progress PRN, manual/mobs for right shoulder, posterior cuff stretching, progress rotator cuff and periscapular strengthening, gradually progress overhead activity    Elaine Daring, PT, DPT, LAT, ATC 04/26/24  12:02 PM Phone: [phone] Fax: [phone]

## 2024-05-08 DIAGNOSIS — F4323 Adjustment disorder with mixed anxiety and depressed mood: Secondary | ICD-10-CM | POA: Diagnosis not present

## 2024-05-09 ENCOUNTER — Encounter: Admitting: Physical Therapy

## 2024-05-10 ENCOUNTER — Encounter: Payer: Self-pay | Admitting: Physical Therapy

## 2024-05-10 ENCOUNTER — Other Ambulatory Visit: Payer: Self-pay

## 2024-05-10 ENCOUNTER — Ambulatory Visit (INDEPENDENT_AMBULATORY_CARE_PROVIDER_SITE_OTHER): Admitting: Physical Therapy

## 2024-05-10 DIAGNOSIS — M6281 Muscle weakness (generalized): Secondary | ICD-10-CM | POA: Diagnosis not present

## 2024-05-10 DIAGNOSIS — G8929 Other chronic pain: Secondary | ICD-10-CM

## 2024-05-10 DIAGNOSIS — M25511 Pain in right shoulder: Secondary | ICD-10-CM | POA: Diagnosis not present

## 2024-05-10 NOTE — Therapy (Signed)
 OUTPATIENT PHYSICAL THERAPY TREATMENT   Patient Name: Anthony Juarez MRN: [id number] DOB:09/03/1945, 78 y.o., male Today's Date: 05/10/2024   END OF SESSION:  PT End of Session - 05/10/24 1356     Visit Number 6    Number of Visits 9    Date for Recertification  05/24/24    Authorization Type MCR    Progress Note Due on Visit 10    PT Start Time 1350    PT Stop Time 1430    PT Time Calculation (min) 40 min    Activity Tolerance Patient tolerated treatment well    Behavior During Therapy [facility] for tasks assessed/performed               Past Medical History:  Diagnosis Date   Allergy    Arthritis    Asthma    childhood   AVM (arteriovenous malformation) brain 06/15/2013   Balanitis    Benign prostatic hypertrophy    Central sleep apnea    Cholecystitis with cholelithiasis    Chronic sinus bradycardia 07/09/2018   Convulsive disorder (HCC)    Esophageal reflux 03/28/2015   History of nuclear stress test    Myoview 11/16: EF 60%, normal perfusion, Low Risk   Hyperlipidemia    Prostatitis    Seizures (HCC)    Past Surgical History:  Procedure Laterality Date   ELBOW SURGERY  11/11/2000   repair   FRACTURE SURGERY     LEFT ELBOW   TONSILLECTOMY     Patient Active Problem List   Diagnosis Date Noted   Right flank pain 10/07/2023   Chest wall mass 12/04/2022   History of sleep apnea 12/01/2022   Nocturnal epilepsy (HCC) 12/01/2022   Reactive airways dysfunction syndrome (HCC) 12/01/2022   Family history of early CAD 05/05/2021   Mild cognitive impairment 06/27/2020   Chronic sinus bradycardia 07/09/2018   Abnormal bowel movement 07/09/2018   Hyperinflation of lungs 07/09/2018   Non-seasonal allergic rhinitis 07/09/2018   Abnormal finding on MRI of brain 12/04/2016   Benign prostatic hyperplasia with incomplete bladder emptying 11/11/2016   Mixed hyperlipidemia 05/13/2016   Esophageal reflux 03/28/2015   Shortness of breath 03/28/2015   Skin lesion  of face 03/28/2015   AVM (arteriovenous malformation) brain 06/15/2013   Seizure disorder (HCC) 01/19/2013   Central sleep apnea 06/10/2012   Epigastric pain 06/06/2012   Gallstones 06/06/2012   Nausea 06/06/2012    PCP: Levora Reyes SAUNDERS, MD  REFERRING PROVIDER: Joane Artist RAMAN, MD  REFERRING DIAG: Chronic right shoulder pain  THERAPY DIAG:  Chronic right shoulder pain  Muscle weakness (generalized)  Rationale for Evaluation and Treatment: Rehabilitation  ONSET DATE: Chronic, July 2025   SUBJECTIVE SUBJECTIVE STATEMENT: Patient reports continues to have difficulty and pain reaching behind his back.  Eval: Patient reports right shoulder pain that happened in July from probably overuse. The pain has improved and he can feel a click in the shoulder. He hasn't exercised in a while because of his shoulder pain, and he doesn't want to make it worse doing some upper body exercises.    Hand dominance: Right  PERTINENT HISTORY: See PMH above  PAIN:  Are you having pain? Yes:  NPRS scale: 0/10 currently, 2-3/10 at worst Pain location: Right shoulder Pain description: Soreness Aggravating factors: Sitting for long periods, raising arm up or lifting overhead Relieving factors: Rest, ice, patches  PRECAUTIONS: None  PATIENT GOALS: Be able to exercise upper body without worsening shoulder pain   OBJECTIVE:  Note: Objective measures were completed at Evaluation unless otherwise noted. PATIENT SURVEYS:  QuickDASH: 15.9% disability  POSTURE: Slight rounded shoulder posture  UPPER EXTREMITY ROM:   Active ROM Right eval Left eval Rt / Lt 05/10/2024  Shoulder flexion 155 145 160  Shoulder extension     Shoulder abduction 165 160   Shoulder adduction     Shoulder internal rotation T8 T5 T8  Shoulder external rotation 70 / T2 70 / T2   Elbow flexion     Elbow extension     Wrist flexion     Wrist extension     Wrist ulnar deviation     Wrist radial deviation      Wrist pronation     Wrist supination     (Blank rows = not tested)  Patient reports pain primarily with reaching behind his back on the right  UPPER EXTREMITY MMT:  MMT Right eval Left eval Right 04/04/2024 Right 04/19/2024 Right 04/26/2024  Shoulder flexion 5 5     Shoulder scaption 4 5 4+  4+  Shoulder extension       Shoulder abduction 4 5   4+  Shoulder adduction       Shoulder internal rotation 5 5     Shoulder external rotation 4+ 5 4+ 4+ 4  Middle trapezius       Lower trapezius       Elbow flexion 5 5     Elbow extension       Wrist flexion       Wrist extension       Wrist ulnar deviation       Wrist radial deviation       Wrist pronation       Wrist supination       Grip strength (lbs)       (Blank rows = not tested)  JOINT MOBILITY TESTING:  Right GHJ hypomobility  PALPATION:  Tender to palpation lateral peripatellar region                                                                                                                             TREATMENT [facility] Adult PT Treatment:                                                DATE: 05/10/2024 UBE L3 x 4 min (fwd/bwd) to improve endurance and workload capacity Right GHJ mobs primarily inferior and posterior at various ranges of elevation Sleeper stretch 5 x 10 sec Sidelying cross body posterior cuff stretch 5 x 10 sec Corner pec stretch 3 x 20 sec Seated overhead shoulder press with 5# 3 x 10 Alternating shoulder taps in modified plantigrade on low table 2 x 20 Bent over row supported on table with 25# 3 x 10  PATIENT EDUCATION: Education details:  HEP update Person educated: Patient Education method: Explanation, Demonstration, Tactile cues, Verbal cues, and Handouts Education comprehension: verbalized understanding, returned demonstration, verbal cues required, tactile cues required, and needs further education  HOME EXERCISE PROGRAM: Access Code: [email or web address]    ASSESSMENT: CLINICAL  IMPRESSION: Patient tolerated therapy well with no adverse effects. Therapy focused on improving right shoulder mobility and posterior cuff stretching to improve ability to reach behind back, and progressing his shoulder strengthening and control. Progressed overhead lifting with good tolerance and incorporated close chain shoulder exercise. He does exhibit improved overhead shoulder elevation but primarily is limited with functional IR reach behind his back. Updated his HEP this visit to work on posterior cuff stretching. Patient would benefit from continued skilled PT to progress mobility and strength in order to reduce pain and maximize functional ability.    Eval: Patient is a 78 y.o. male who was seen today for physical therapy evaluation and treatment for chronic right shoulder pain. His symptoms are consistent with subacromial pain syndrome with likely rotator cuff tendinopathy. He exhibits primarily limitations with lifting overhead or reaching behind his back that are impacting his functional ability.   OBJECTIVE IMPAIRMENTS: decreased activity tolerance, decreased ROM, decreased strength, postural dysfunction, and pain.   ACTIVITY LIMITATIONS: lifting, sitting, and reach over head  PARTICIPATION LIMITATIONS: community activity  PERSONAL FACTORS: Time since onset of injury/illness/exacerbation are also affecting patient's functional outcome.    GOALS: Goals reviewed with patient? Yes  SHORT TERM GOALS: Target date: 04/26/2024  Patient will be I with initial HEP in order to progress with therapy. Baseline: HEP provided at eval 04/26/2024: independent Goal status: MET  2.  Patient will report with lifting light weights overhead </= 2/10 in order to reduce functional limitations Baseline: 4-5/10 pain 04/26/2024: 3/10 05/10/2024: 2/10 lifting 5# overhead Goal status: MET  LONG TERM GOALS: Target date: 05/24/2024  Patient will be I with final HEP to maintain progress from  PT. Baseline: HEP provided at eval Goal status: INITIAL  2.  Patient will report QuickDASH </= 5% in order to indicate an improvement in their functional status Baseline: 15.9% disability Goal status: INITIAL  3.  Patient will demonstrate right shoulder strength 5/5 MMT to improve his activity tolerance and reduce pain with lifting Baseline: see limitations above Goal status: INITIAL  4.  Patient will demonstrate right shoulder AROM without pain or limitation to reduce functional limitations  Baseline: see limitations above Goal status: INITIAL   PLAN: PT FREQUENCY: 1x/week  PT DURATION: 8 weeks  PLANNED INTERVENTIONS: 97164- PT Re-evaluation, 97750- Physical Performance Testing, 97110-Therapeutic exercises, 97530- Therapeutic activity, 97112- Neuromuscular re-education, 97535- Self Care, 02859- Manual therapy, 20560 (1-2 muscles), 20561 (3+ muscles)- Dry Needling, Patient/Family education, Taping, Joint mobilization, Joint manipulation, Spinal manipulation, Spinal mobilization, Cryotherapy, and Moist heat  PLAN FOR NEXT SESSION: Review HEP and progress PRN, manual/mobs for right shoulder, posterior cuff stretching, progress rotator cuff and periscapular strengthening, gradually progress overhead activity    Elaine Daring, PT, DPT, LAT, ATC 05/10/24  2:33 PM Phone: [phone] Fax: [phone]

## 2024-05-10 NOTE — Patient Instructions (Signed)
 Access Code: [email or web address] URL: [email or web address] Date: 05/10/2024 Prepared by: Elaine Daring  Exercises - Standing Single Arm Row with Resistance Thumb Up  - 3 x weekly - 3 sets - 15 reps - Shoulder External Rotation with Anchored Resistance  - 3 x weekly - 3 sets - 10 reps - Single Arm Scaption with Resistance  - 3 x weekly - 3 sets - 10 reps - Doorway Pec Stretch at 90 Degrees Abduction  - 1 x daily - 3 reps - 20-30 seconds hold - Sleeper Stretch  - 1 x daily - 10 reps - 10 seconds hold - Sidelying Posterior Cuff Cross Body Stretch  - 1 x daily - 10 reps - 10 seconds hold

## 2024-05-15 ENCOUNTER — Encounter: Payer: Self-pay | Admitting: Family Medicine

## 2024-05-15 ENCOUNTER — Ambulatory Visit (INDEPENDENT_AMBULATORY_CARE_PROVIDER_SITE_OTHER): Admitting: Family Medicine

## 2024-05-15 VITALS — BP 130/66 | HR 58 | Ht 69.0 in | Wt 160.0 lb

## 2024-05-15 DIAGNOSIS — G8929 Other chronic pain: Secondary | ICD-10-CM | POA: Diagnosis not present

## 2024-05-15 DIAGNOSIS — M25511 Pain in right shoulder: Secondary | ICD-10-CM

## 2024-05-15 NOTE — Patient Instructions (Addendum)
 Thank you for coming in today.   Continue home exercises.   Next step if not doing well enough would be an injection.  Let me know.   Please use Voltaren gel (Generic Diclofenac Gel) up to 4x daily for pain as needed.  This is available over-the-counter as both the name brand Voltaren gel and the generic diclofenac gel.   Recheck as needed.

## 2024-05-15 NOTE — Progress Notes (Signed)
   I, Leotis Batter, CMA acting as a scribe for Artist Lloyd, MD.  Anthony Juarez is a 78 y.o. male who presents to [facility] at [facility] today for 65-month f/u R shoulder pain. Pt was last seen by Dr. Lloyd on 03/15/24 and was advised on lifting protocol and referred to PT, completing 6 visits.  Today, pt reports compliance with HEP. Notes some improvement of shoulder pain. Will still get sore with overhead reaching but not as sore. Denies new or worsening sx since last visit.   Dx imaging: 03/15/24 R shoulder XR  Pertinent review of systems: No fevers or chills  Relevant historical information: Reactive airways disease and seizure disorder.   Exam:  BP 130/66   Pulse (!) 58   Ht 5' 9 (1.753 m)   Wt 160 lb (72.6 kg)   SpO2 97%   BMI 23.63 kg/m  General: Well Developed, well nourished, and in no acute distress.   MSK: Right shoulder normal appearing Tender palpation AC joint.  Normal shoulder motion. Pain with crossover arm compression test.   Lab and Radiology Results  EXAM: XRAY OF THE RIGHT SHOULDER 03/15/2024 11:51:53 AM   COMPARISON: 11/30/2013   CLINICAL HISTORY: Right shoulder pain x several weeks, NKI.   FINDINGS:   BONES AND JOINTS: Glenohumeral joint is normally aligned. No acute fracture or dislocation. Severe degenerative changes of the acromioclavicular joint with joint space narrowing and osteophyte formation.   SOFT TISSUES: No abnormal calcifications. Visualized lung is unremarkable.   IMPRESSION: 1. Severe degenerative changes of the acromioclavicular joint with joint space narrowing and osteophyte formation.   Electronically signed by: Katheleen Faes MD 03/23/2024 04:30 PM EDT RP Workstation: [id number]  I, Artist Lloyd, personally (independently) visualized and performed the interpretation of the images attached in this note.    Assessment and Plan: 78 y.o. male with right shoulder pain due to impingement and bursitis  as well as AC DJD.  Dominant pain remaining following PT and home exercise program as [facility] joint related pain.  Plan for Voltaren gel and continued home exercise program.  If not improved enough next step would be AC injection.  Check back as needed.   PDMP not reviewed this encounter. No orders of the defined types were placed in this encounter.  No orders of the defined types were placed in this encounter.    Discussed warning signs or symptoms. Please see discharge instructions. Patient expresses understanding.   The above documentation has been reviewed and is accurate and complete Artist Lloyd, M.D.

## 2024-05-22 DIAGNOSIS — F4323 Adjustment disorder with mixed anxiety and depressed mood: Secondary | ICD-10-CM | POA: Diagnosis not present

## 2024-05-24 ENCOUNTER — Encounter: Payer: Self-pay | Admitting: Physical Therapy

## 2024-05-24 ENCOUNTER — Ambulatory Visit (INDEPENDENT_AMBULATORY_CARE_PROVIDER_SITE_OTHER): Admitting: Physical Therapy

## 2024-05-24 ENCOUNTER — Other Ambulatory Visit: Payer: Self-pay

## 2024-05-24 DIAGNOSIS — M25511 Pain in right shoulder: Secondary | ICD-10-CM

## 2024-05-24 DIAGNOSIS — M6281 Muscle weakness (generalized): Secondary | ICD-10-CM | POA: Diagnosis not present

## 2024-05-24 DIAGNOSIS — G8929 Other chronic pain: Secondary | ICD-10-CM | POA: Diagnosis not present

## 2024-05-24 NOTE — Therapy (Signed)
 OUTPATIENT PHYSICAL THERAPY TREATMENT  DISCHARGE   Patient Name: Anthony Juarez MRN: [id number] DOB:[birth year], 78 y.o., male Today's Date: 05/24/2024   END OF SESSION:  PT End of Session - 05/24/24 1101     Visit Number 7    Number of Visits 9    Date for Recertification  05/24/24    Authorization Type MCR    Progress Note Due on Visit 10    PT Start Time 1100    PT Stop Time 1145    PT Time Calculation (min) 45 min    Activity Tolerance Patient tolerated treatment well    Behavior During Therapy [facility] for tasks assessed/performed                Past Medical History:  Diagnosis Date   Allergy    Arthritis    Asthma    childhood   AVM (arteriovenous malformation) brain 06/15/2013   Balanitis    Benign prostatic hypertrophy    Central sleep apnea    Cholecystitis with cholelithiasis    Chronic sinus bradycardia 07/09/2018   Convulsive disorder (HCC)    Esophageal reflux 03/28/2015   History of nuclear stress test    Myoview 11/16: EF 60%, normal perfusion, Low Risk   Hyperlipidemia    Prostatitis    Seizures (HCC)    Past Surgical History:  Procedure Laterality Date   ELBOW SURGERY  11/11/2000   repair   FRACTURE SURGERY     LEFT ELBOW   TONSILLECTOMY     Patient Active Problem List   Diagnosis Date Noted   Right flank pain 10/07/2023   Chest wall mass 12/04/2022   History of sleep apnea 12/01/2022   Nocturnal epilepsy (HCC) 12/01/2022   Reactive airways dysfunction syndrome (HCC) 12/01/2022   Family history of early CAD 05/05/2021   Mild cognitive impairment 06/27/2020   Chronic sinus bradycardia 07/09/2018   Abnormal bowel movement 07/09/2018   Hyperinflation of lungs 07/09/2018   Non-seasonal allergic rhinitis 07/09/2018   Abnormal finding on MRI of brain 12/04/2016   Benign prostatic hyperplasia with incomplete bladder emptying 11/11/2016   Mixed hyperlipidemia 05/13/2016   Esophageal reflux 03/28/2015   Shortness of breath 03/28/2015    Skin lesion of face 03/28/2015   AVM (arteriovenous malformation) brain 06/15/2013   Seizure disorder (HCC) 01/19/2013   Central sleep apnea 06/10/2012   Epigastric pain 06/06/2012   Gallstones 06/06/2012   Nausea 06/06/2012    PCP: Levora Reyes SAUNDERS, MD  REFERRING PROVIDER: Joane Artist RAMAN, MD  REFERRING DIAG: Chronic right shoulder pain  THERAPY DIAG:  Chronic right shoulder pain  Muscle weakness (generalized)  Rationale for Evaluation and Treatment: Rehabilitation  ONSET DATE: Chronic, July 2025   SUBJECTIVE SUBJECTIVE STATEMENT: Patient reports the shoulder has gotten better and the strengthening has gotten better. He has been going to the gym.   Eval: Patient reports right shoulder pain that happened in July from probably overuse. The pain has improved and he can feel a click in the shoulder. He hasn't exercised in a while because of his shoulder pain, and he doesn't want to make it worse doing some upper body exercises.    Hand dominance: Right  PERTINENT HISTORY: See PMH above  PAIN:  Are you having pain? Yes:  NPRS scale: 0/10 currently, 2-3/10 at worst Pain location: Right shoulder Pain description: Soreness Aggravating factors: Sitting for long periods, raising arm up or lifting overhead Relieving factors: Rest, ice, patches  PRECAUTIONS: None  PATIENT GOALS: Be able  to exercise upper body without worsening shoulder pain   OBJECTIVE:  Note: Objective measures were completed at Evaluation unless otherwise noted. PATIENT SURVEYS:  QuickDASH: 15.9% disability  05/24/2024: 6.8% disability  POSTURE: Slight rounded shoulder posture  UPPER EXTREMITY ROM:   Active ROM Right eval Left eval Rt / Lt 05/10/2024 Right 05/24/2024  Shoulder flexion 155 145 160 160  Shoulder extension      Shoulder abduction 165 160    Shoulder adduction      Shoulder internal rotation T8 T5 T8 T8  Shoulder external rotation 70 / T2 70 / T2    Elbow flexion       Elbow extension      Wrist flexion      Wrist extension      Wrist ulnar deviation      Wrist radial deviation      Wrist pronation      Wrist supination      (Blank rows = not tested)  Patient reports pain primarily with reaching behind his back on the right  UPPER EXTREMITY MMT:  MMT Right eval Left eval Right 04/04/2024 Right 04/19/2024 Right 04/26/2024 Right 05/24/2024  Shoulder flexion 5 5    5   Shoulder scaption 4 5 4+  4+ 5  Shoulder extension        Shoulder abduction 4 5   4+ 5  Shoulder adduction        Shoulder internal rotation 5 5      Shoulder external rotation 4+ 5 4+ 4+ 4 5  Middle trapezius        Lower trapezius        Elbow flexion 5 5      Elbow extension        Wrist flexion        Wrist extension        Wrist ulnar deviation        Wrist radial deviation        Wrist pronation        Wrist supination        Grip strength (lbs)        (Blank rows = not tested)  05/24/2024: patient report pain with right shoulder abduction testing  JOINT MOBILITY TESTING:  Right GHJ hypomobility  PALPATION:  Tender to palpation lateral peripatellar region                                                                                                                             TREATMENT [facility] Adult PT Treatment:                                                DATE: 05/24/2024 UBE L3 x 4 min (fwd/bwd) to improve endurance and workload capacity Right GHJ mobs primarily inferior and posterior at various ranges  of elevation Sidelying thoracic rotation x 10 each Sleeper stretch 5 x 10 sec Sidelying cross body posterior cuff stretch 5 x 10 sec Corner pec stretch 3 x 20 sec Standing shoulder ER with blue x 10 Row with black x 15 Scaption with 5# x 10  Discussed continuing with HEP and gym exercises for strengthening, modifications for using weights or machines at gym versus using bands at home.  PATIENT EDUCATION: Education details: POC discharge, HEP Person  educated: Patient Education method: Explanation Education comprehension: verbalized understanding  HOME EXERCISE PROGRAM: Access Code: [email or web address]    ASSESSMENT: CLINICAL IMPRESSION: Patient tolerated therapy well with no adverse effects. He has made progressing in therapy, reporting improvement in functional status and demonstrating improvement in strength and range of motion. He does remain slightly limited primarily reaching behind his back and has pain with overhead activities. At this time patient is independent with his HEP and will transition to home program and gym exercises. Patient will be formally discharge from PT at this time.   Eval: Patient is a 78 y.o. male who was seen today for physical therapy evaluation and treatment for chronic right shoulder pain. His symptoms are consistent with subacromial pain syndrome with likely rotator cuff tendinopathy. He exhibits primarily limitations with lifting overhead or reaching behind his back that are impacting his functional ability.   OBJECTIVE IMPAIRMENTS: decreased activity tolerance, decreased ROM, decreased strength, postural dysfunction, and pain.   ACTIVITY LIMITATIONS: lifting, sitting, and reach over head  PARTICIPATION LIMITATIONS: community activity  PERSONAL FACTORS: Time since onset of injury/illness/exacerbation are also affecting patient's functional outcome.    GOALS: Goals reviewed with patient? Yes  SHORT TERM GOALS: Target date: 04/26/2024  Patient will be I with initial HEP in order to progress with therapy. Baseline: HEP provided at eval 04/26/2024: independent Goal status: MET  2.  Patient will report with lifting light weights overhead </= 2/10 in order to reduce functional limitations Baseline: 4-5/10 pain 04/26/2024: 3/10 05/10/2024: 2/10 lifting 5# overhead Goal status: MET  LONG TERM GOALS: Target date: 05/24/2024  Patient will be I with final HEP to maintain progress from PT. Baseline: HEP  provided at eval 05/24/2024: independent Goal status: MET  2.  Patient will report QuickDASH </= 5% in order to indicate an improvement in their functional status Baseline: 15.9% disability 05/24/2024: 6.8% disability Goal status: PARTIALLY MET  3.  Patient will demonstrate right shoulder strength 5/5 MMT to improve his activity tolerance and reduce pain with lifting Baseline: see limitations above 05/24/2024: grossly 5/5 MMT Goal status: MET  4.  Patient will demonstrate right shoulder AROM without pain or limitation to reduce functional limitations  Baseline: see limitations above 05/24/2024: limitation reaching behind his back Goal status: PARTIALLY MET   PLAN: PT FREQUENCY: 1x/week  PT DURATION: 8 weeks  PLANNED INTERVENTIONS: 97164- PT Re-evaluation, 97750- Physical Performance Testing, 97110-Therapeutic exercises, 97530- Therapeutic activity, 97112- Neuromuscular re-education, 97535- Self Care, [id number]- Manual therapy, 20560 (1-2 muscles), 20561 (3+ muscles)- Dry Needling, Patient/Family education, Taping, Joint mobilization, Joint manipulation, Spinal manipulation, Spinal mobilization, Cryotherapy, and Moist heat  PLAN FOR NEXT SESSION: NA - discharge    Elaine Daring, PT, DPT, LAT, ATC 05/24/24  12:48 PM Phone: [phone] Fax: [phone]  PHYSICAL THERAPY DISCHARGE SUMMARY  Visits from Start of Care: 7  Current functional level related to goals / functional outcomes: See above   Remaining deficits: See above   Education / Equipment: HEP   Patient agrees to discharge. Patient goals were partially met.  Patient is being discharged due to being pleased with the current functional level.

## 2024-05-29 DIAGNOSIS — Z6823 Body mass index (BMI) 23.0-23.9, adult: Secondary | ICD-10-CM | POA: Diagnosis not present

## 2024-05-29 DIAGNOSIS — L729 Follicular cyst of the skin and subcutaneous tissue, unspecified: Secondary | ICD-10-CM | POA: Diagnosis not present

## 2024-05-29 DIAGNOSIS — N5089 Other specified disorders of the male genital organs: Secondary | ICD-10-CM | POA: Diagnosis not present

## 2024-06-06 DIAGNOSIS — F4323 Adjustment disorder with mixed anxiety and depressed mood: Secondary | ICD-10-CM | POA: Diagnosis not present

## 2024-06-20 ENCOUNTER — Other Ambulatory Visit: Payer: Self-pay | Admitting: Thoracic Surgery (Cardiothoracic Vascular Surgery)

## 2024-06-20 DIAGNOSIS — R222 Localized swelling, mass and lump, trunk: Secondary | ICD-10-CM

## 2024-06-23 ENCOUNTER — Encounter: Payer: Self-pay | Admitting: Cardiology

## 2024-06-30 ENCOUNTER — Other Ambulatory Visit: Payer: Self-pay | Admitting: Family Medicine

## 2024-07-19 ENCOUNTER — Ambulatory Visit (HOSPITAL_COMMUNITY)
Admission: RE | Admit: 2024-07-19 | Discharge: 2024-07-19 | Disposition: A | Discharge status: Home / Self Care | Source: Ambulatory Visit | Attending: Internal Medicine | Admitting: Internal Medicine

## 2024-07-19 DIAGNOSIS — R222 Localized swelling, mass and lump, trunk: Secondary | ICD-10-CM | POA: Diagnosis present

## 2024-07-24 NOTE — Progress Notes (Unsigned)
 SABRA

## 2024-07-25 ENCOUNTER — Ambulatory Visit: Admitting: Neurology

## 2024-07-25 ENCOUNTER — Encounter: Payer: Self-pay | Admitting: Neurology

## 2024-07-25 ENCOUNTER — Telehealth: Payer: Self-pay | Admitting: Neurology

## 2024-07-25 VITALS — BP 140/74 | HR 62 | Ht 70.8 in | Wt 162.2 lb

## 2024-07-25 DIAGNOSIS — G3184 Mild cognitive impairment, so stated: Secondary | ICD-10-CM

## 2024-07-25 DIAGNOSIS — G40909 Epilepsy, unspecified, not intractable, without status epilepticus: Secondary | ICD-10-CM | POA: Diagnosis not present

## 2024-07-25 DIAGNOSIS — G4731 Primary central sleep apnea: Secondary | ICD-10-CM | POA: Diagnosis not present

## 2024-07-25 MED ORDER — LAMOTRIGINE ER 200 MG PO TB24: 1.0000 | ORAL_TABLET | Freq: Every day | ORAL | 4 refills | Status: AC

## 2024-07-25 MED ORDER — MEMANTINE HCL 10 MG PO TABS: 10.0000 mg | ORAL_TABLET | Freq: Two times a day (BID) | ORAL | 3 refills | Status: AC

## 2024-07-25 NOTE — Progress Notes (Signed)
 "  ASSESSMENT AND PLAN 79 y.o. year old male     Epilepsy  -Doing well, no recent seizures   -Continue lamotrigine  200 mg ER daily  -MRI of the brain showed stable right frontal venous malformation versus cavernous angioma  Mild cognitive impairment  -Overall stable, MoCA examination 29/30   -Laboratory evaluation showed no treatable etiology  -Alzheimer profile, beta amyloid PET scan suggestive of Alzheimer's pathology, APO E4 heterozygous,  -Neuropsychological testing February 2025 was not suggestive of neurodegenerative condition.  No clear evidence of progressive dementia.  -History of right frontal venous malformation, decided to hold off on treatment with Leqembi or Kisunla due to associated right for brain bleeding  -Continue Namenda  10 mg twice daily, hold off on Aricept due to heart rate in the 50s.    -Had revisit with [name] last year, will request those notes to review  Complex sleep apnea, central sleep apnea (setup 05/11/23)  -Very good benefit.  Superb compliance.  -Continue nightly use minimum 4 hours, same settings  -With change to set pressure 10 cmH2O January 2025, improvement in AHI 2.6 (central 1.4, obstructive 0.7)    Follow up in 1 year.   DIAGNOSTIC DATA (LABS, IMAGING, TESTING) - I reviewed patient records, labs, notes, testing and imaging myself where available.   HISTORY OF PRESENT ILLNESS:  Anthony Juarez is a 79 yo WM, following up for seizure disorder   He has PMHx of seizure since infant, he was treated with dilantin and phenobarbital for a long time, was eventurally tapered off after seizure free since age 42, he had one recurrent seizure at age 17, was put back on dilantin 400mg  qhs, has been on it since.   He had Bachelor's degree, retired as building services engineer, animal nutritionist  in 2009, moved to [place] about 2.5 years ago, now he stays at home most of time, rarely initiated activities, felt fatigue, difficulty concontrating, getting  worse since summer of 2013.  His wife complains that he is not up to his house projects anymore, frequent nocturia, which has prompted recent treatment with desmopression, which helped his frequent night time awaking,  He also has sleep apnea, but could not tolerate his BiPAP machine.He is seen by Dr. Corrie.   Dilantin level was 32 while he was taking Dilantin 100 mg 4 tablets each night, level decreased to 22 while he was taking Dilantin 100 mg 3 tablets each night, he has developed gingival hypertrophy, his brain fogginess has much improved with decreased dose of Dilantin, especially after he stopped taking Desopressin,    MRI scan of the brain showed a cavernous angioma with remote age hemorrhage in the right frontal subcortical region with  and adjacent  venous angioma.  There are moderate changes of chronic microvascular ischemia and mild degree of generalized cerebral atrophy.   Followup visit today patient has been switched to Keppra  XR 750mg  daily. He is off Dilantin totally and his brain fogginess has improved. He has not had further seizure activity.   UPDATE Jan 19th 2015: He is now taking keppra  xr 750mg  qhs, last seizure 1992, nocturnal seizure,  he only has mild fatigue, no longer having brain foggy sensation We have reviewed MRI taking January 2015, continue the right inferior frontal cavernous venous angioma, mild atrophy, moderate small vessel disease,   UPDATE Jan 19th 2016; I have reviewed MRI brain (with and without): right frontal juxtacortical T2 heterogenous lesion (1.4x1.0cm), with popcorn appearance, consistent with cavernous malformation. There is an associated  small developmental venous anomaly.   Multiple round and ovoid, periventricular, subcortical and juxtacortical T2 hyperintense foci. These findings are non-specific and considerations include autoimmune, inflammatory, post-infectious, microvascular ischemic or migraine associated etiologies.     He is taking Keppra   XR 750 mg every night, there was no recurrent seizure   UPDATE Aug 01 2015: He had sleep study recently,  No need for CPAP machine, he is taking keppra  xr 750mg  qhs, no recurrent seizure.    We have reviewed MRI of the brain with and without contrast in January 2017: 1.   A focus of heterogenous signal in the superficial right frontal lobe with a hemosiderin rim and adjacent venous anomaly. This has characteristics of a cavernous malformation.   Compared to the 07/20/2013 MRI, there has been no definite change. 2.   Scattered T2/FLAIR hyperintense foci, predominantly in the subcortical deep white matter of both hemispheres. This is stable compared to the prior MRI. She is a nonspecific finding and potential etiology could be chronic microvascular ischemic change, migraine, chronic demyelination.    UPDATE Jun 21 2018: He is overall doing well, taking Keppra  750 mg tablets half tablets twice a day, does complain some moodiness, discussed with patient we decided to switch him to lamotrigine  200 mg every day  UPDATE Jun 27 2020: He is overall doing very well, has no recurrent seizure, tolerating lamotrigine  ER 200 mg well, complains of difficulty sleeping occasionally, especially after using bathroom at nighttime, he hiked with his wife regularly,  Recent couple years, noticed mild memory loss, word finding difficulties, MoCA examination 29/30 today,  We again personally reviewed MRI of brain with without contrast in 2017: Focus of heterogeneous signal at the superficial right frontal lobe, with hemosiderin rim and adjacent venous anomaly, characteristic of a cavernous malformation,   Virtual Visit via video August 12, 2023 I discussed the limitations of evaluation and management by telemedicine and the availability of in person appointments. The patient expressed understanding and agreed to proceed  Location: Provider: [facility] office; Patient: Home  I connected with Anthony Juarez  on August 12, 2023 by a video enabled telemedicine application and verified that I am speaking with the correct person using two identifiers.  UPDATED HiSTORY He continue taking lamotrigine  ER 200 mg every night, no recurrent seizure  Over the past few years, he struggled with short-term memory loss, direction while driving, still active, hiking regularly, hiked to 1000 miles in 2024, volunteers at food pantry,  MoCA examination in October 2024 was 30/30  Sleep study in July 2024 showed mild to moderate obstructive sleep apnea with significant REM sleep tendency, high portion of central apnea, CPAP titration since September 2024, did help him some  MRI of the brain in May 2024 showed stable right frontal developmental venous anomaly versus venous malformation, With his concern of memory issue, He was offered amyloid PET scan June 15, 2023, positive, moderate to frequent neurotic beta amyloid plaque in the brain, suggestive of Alzheimer's pathology  ATN profile showed decreased beta amyloid 42/40 ratio, with elevated p-tau 181, also consistent with Alzheimer's pathology  ApoE genotyping showed 1 copy of APO E4 variant,/Apo E3,  Update Dec 08, 2023 SS: Review of CPAP data shows 100% compliance, AHI 2.6, leak 11.4 set pressure 10 cmH2O. He looks forward to putting CPAP on at night, because he sleeps better. Can now sleep solid 4-5 hours before waking up. No seizures remains on Lamictal .   Feels memory is about the same. Dr. Onita started  Namenda  10 mg twice daily. Tolerating well. Continues with issues with recall.   Reviewed neuropsychological evaluation from 09/02/2023 with Dr. Jenna Renfroe.  His neuropsychological profile does not suggest cognitive phenotype of Alzheimer's disease or another neurodegenerative condition.  Overall he exhibits subtle cognitive inefficiencies.  No clear evidence of progressive dementia at this time.   Recommendations: Continue epilepsy treatment, discussed potential  cognitive side effects of Lamictal .  OSA treatment compliance, participate in exercise, follow-up neuropsych evaluation in 12 to 18 months.  Recommended psychotherapy counseling.  Update 07/25/24 SS: Saw [name] since last seen, felt stable, biggest issue recall, saw provider for CBT with good benefit. Love CPAP, excellent compliance 100%, AHI 2.0. no seizures, on Lamictal  XR 200 mg.   REVIEW OF SYSTEMS: Out of a complete 14 system review of symptoms, the patient complains only of the following symptoms, and all other reviewed systems are negative.  Seizures  ALLERGIES: Allergies  Allergen Reactions   Penicillins Rash    Did it involve swelling of the face/tongue/throat, SOB, or low BP? No  Did it involve sudden or severe rash/hives, skin peeling, or any reaction on the inside of your mouth or nose? Yes  Did you need to seek medical attention at a hospital or doctor's office? No  When did it last happen?  Decades ago.  If all above answers are NO, may proceed with cephalosporin use.  Injectable- rash at injection site    Did it involve swelling of the face/tongue/throat, SOB, or low BP? No Did it involve sudden or severe rash/hives, skin peeling, or any reaction on the inside of your mouth or nose? Yes Did you need to seek medical attention at a hospital or doctor's office? No When did it last happen?  Decades ago. If all above answers are NO, may proceed with cephalosporin use.    HOME MEDICATIONS: Outpatient Medications Prior to Visit  Medication Sig Dispense Refill   albuterol  (PROVENTIL  HFA;VENTOLIN  HFA) 108 (90 Base) MCG/ACT inhaler Inhale 2 puffs into the lungs every 4 (four) hours as needed for wheezing or shortness of breath (cough, shortness of breath or wheezing.). 1 Inhaler 2   aspirin  EC 81 MG tablet Take 81 mg by mouth daily.     Creatine Monohydrate POWD Take 5 g by mouth daily.     ezetimibe  (ZETIA ) 10 MG tablet Take 1 tablet (10 mg total) by mouth daily. 90  tablet 3   finasteride  (PROSCAR ) 5 MG tablet Take 1 tablet (5 mg total) by mouth daily. 90 tablet 2   fluticasone  (FLONASE ) 50 MCG/ACT nasal spray USE 1 SPRAY IN EACH NOSTRIL TWICE A DAY. 16 g 6   LamoTRIgine  200 MG TB24 24 hour tablet Take 1 tablet (200 mg total) by mouth at bedtime. 90 tablet 4   Melatonin 1 MG/ML LIQD Take 2 mg by mouth.     meloxicam  (MOBIC ) 7.5 MG tablet Take 1 tablet (7.5 mg total) by mouth daily as needed for pain. 30 tablet 0   memantine  (NAMENDA ) 10 MG tablet Take 1 tablet (10 mg total) by mouth 2 (two) times daily. 60 tablet 11   montelukast  (SINGULAIR ) 10 MG tablet TAKE ONE TABLET AT BEDTIME. 90 tablet 3   Multiple Vitamins-Minerals (MULTIVITAMIN WITH MINERALS) tablet Take 1 tablet by mouth daily.     omeprazole  (PRILOSEC) 20 MG capsule Take 1 capsule (20 mg total) by mouth daily. 30 capsule 0   rosuvastatin  (CRESTOR ) 20 MG tablet Take 1 tablet (20 mg total) by mouth daily.  90 tablet 3   tamsulosin (FLOMAX) 0.4 MG CAPS capsule Take 0.4 mg by mouth daily.     No facility-administered medications prior to visit.    PAST MEDICAL HISTORY: Past Medical History:  Diagnosis Date   Allergy    Arthritis    Asthma    childhood   AVM (arteriovenous malformation) brain 06/15/2013   Balanitis    Benign prostatic hypertrophy    Central sleep apnea    Cholecystitis with cholelithiasis    Chronic sinus bradycardia 07/09/2018   Convulsive disorder (HCC)    Esophageal reflux 03/28/2015   History of nuclear stress test    Myoview 11/16: EF 60%, normal perfusion, Low Risk   Hyperlipidemia    Prostatitis    Seizures (HCC)     PAST SURGICAL HISTORY: Past Surgical History:  Procedure Laterality Date   ELBOW SURGERY  11/11/2000   repair   FRACTURE SURGERY     LEFT ELBOW   TONSILLECTOMY      FAMILY HISTORY: Family History  Problem Relation Age of Onset   Heart disease Father        cabg 38's   Cancer Father        Prostate   Heart disease Maternal Grandfather         Late 28's   Dementia Mother     SOCIAL HISTORY: Social History   Socioeconomic History   Marital status: Married    Spouse name: Sedonia   Number of children: Not on file   Years of education: Not on file   Highest education level: Bachelor's degree (e.g., BA, AB, BS)  Occupational History   Occupation: retired    Associate Professor: RETIRED  Tobacco Use   Smoking status: Former    Current packs/day: 0.00    Average packs/day: 1 pack/day for 10.0 years (10.0 ttl pk-yrs)    Types: Cigarettes    Start date: 07/14/1967    Quit date: 07/13/1977    Years since quitting: 47.0   Smokeless tobacco: Never  Vaping Use   Vaping status: Never Used  Substance and Sexual Activity   Alcohol use: Yes    Alcohol/week: 2.0 standard drinks of alcohol    Types: 2 Cans of beer per week    Comment: occ   Drug use: No   Sexual activity: Not Currently    Birth control/protection: Post-menopausal, Abstinence  Other Topics Concern   Not on file  Social History Narrative   Patient worked in engineer, materials.   Social Drivers of Health   Tobacco Use: Medium Risk (07/25/2024)   Patient History    Smoking Tobacco Use: Former    Smokeless Tobacco Use: Never    Passive Exposure: Not on file  Financial Resource Strain: Low Risk (02/19/2024)   Overall Financial Resource Strain (CARDIA)    Difficulty of Paying Living Expenses: Not hard at all  Food Insecurity: No Food Insecurity (02/19/2024)   Epic    Worried About Programme Researcher, Broadcasting/film/video in the Last Year: Never true    Ran Out of Food in the Last Year: Never true  Transportation Needs: No Transportation Needs (02/19/2024)   Epic    Lack of Transportation (Medical): No    Lack of Transportation (Non-Medical): No  Physical Activity: Sufficiently Active (02/19/2024)   Exercise Vital Sign    Days of Exercise per Week: 3 days    Minutes of Exercise per Session: 120 min  Stress: No Stress Concern Present (02/19/2024)   [employer] of Occupational Health -  Occupational Stress Questionnaire    Feeling of Stress: Not at all  Social Connections: Moderately Isolated (02/19/2024)   Social Connection and Isolation Panel    Frequency of Communication with Friends and Family: Never    Frequency of Social Gatherings with Friends and Family: Once a week    Attends Religious Services: Patient declined    Active Member of Clubs or Organizations: Yes    Attends Banker Meetings: 1 to 4 times per year    Marital Status: Married  Catering Manager Violence: Not At Risk (10/21/2022)   Humiliation, Afraid, Rape, and Kick questionnaire    Fear of Current or Ex-Partner: No    Emotionally Abused: No    Physically Abused: No    Sexually Abused: No  Depression (PHQ2-9): Low Risk (02/23/2024)   Depression (PHQ2-9)    PHQ-2 Score: 1  Alcohol Screen: Low Risk (02/19/2024)   Alcohol Screen    Last Alcohol Screening Score (AUDIT): 3  Housing: Low Risk (02/19/2024)   Epic    Unable to Pay for Housing in the Last Year: No    Number of Times Moved in the Last Year: 0    Homeless in the Last Year: No  Utilities: Not At Risk (10/21/2022)   [facility] Utilities    Threatened with loss of utilities: No  Health Literacy: Not on file   PHYSICAL EXAM  Vitals:   07/25/24 0843  BP: (!) 140/74  Pulse: 62  SpO2: 98%  Weight: 162 lb 3.2 oz (73.6 kg)  Height: 5' 10.8 (1.798 m)      07/25/2024    8:55 AM 05/12/2023    8:46 AM 10/21/2022    9:19 AM 10/15/2021   10:19 AM 06/27/2020    3:00 PM  Montreal Cognitive Assessment   Visuospatial/ Executive (0/5) 5 5 5 5 5   Naming (0/3) 3 3 3 3 3   Attention: Read list of digits (0/2) 2 2 2 2 2   Attention: Read list of letters (0/1) 1 1 1 1 1   Attention: Serial 7 subtraction starting at 100 (0/3) 3 3 1 3 3   Language: Repeat phrase (0/2) 2 2 2 2 2   Language : Fluency (0/1) 1 1 1 1 1   Abstraction (0/2) 2 2 2 2 2   Delayed Recall (0/5) 4 5 5 5 4   Orientation (0/6) 6 6 6 6 6   Total 29 30 28 30 29   Adjusted Score (based on  education)  30  30      Body mass index is 22.75 kg/m.  Physical Exam  General: The patient is alert and cooperative at the time of the examination.  Skin: No significant peripheral edema is noted.  Neurologic Exam  Mental status: The patient is alert and oriented x 3 at the time of the examination. The patient has apparent normal recent and remote memory, with an apparently normal attention span and concentration ability.  Cranial nerves: Facial symmetry is present. Speech is normal, no aphasia or dysarthria is noted. Extraocular movements are full. Visual fields are full.  Motor: The patient has good strength in all 4 extremities.  Sensory examination: Soft touch sensation is symmetric on the face, arms, and legs.  Coordination: The patient has good finger-nose-finger and heel-to-shin bilaterally.  Gait and station: The patient has a normal gait.  Reflexes: Deep tendon reflexes are symmetric.     07/25/2024    8:55 AM 05/12/2023    8:46 AM 10/21/2022    9:19 AM 10/15/2021   10:19  AM 06/27/2020    3:00 PM  Montreal Cognitive Assessment   Visuospatial/ Executive (0/5) 5 5 5 5 5   Naming (0/3) 3 3 3 3 3   Attention: Read list of digits (0/2) 2 2 2 2 2   Attention: Read list of letters (0/1) 1 1 1 1 1   Attention: Serial 7 subtraction starting at 100 (0/3) 3 3 1 3 3   Language: Repeat phrase (0/2) 2 2 2 2 2   Language : Fluency (0/1) 1 1 1 1 1   Abstraction (0/2) 2 2 2 2 2   Delayed Recall (0/5) 4 5 5 5 4   Orientation (0/6) 6 6 6 6 6   Total 29 30 28 30 29   Adjusted Score (based on education)  30  30    Lauraine Gayland MANDES, DNP  [facility] [address], [address] [place], [place] [address] [phone]  "

## 2024-07-25 NOTE — Patient Instructions (Signed)
 Great to see you today! Continue Lamictal  for seizure prevention  Continue CPAP nightly minimum 4 hours I will get notes from [name] Stay active, exercise Follow up in 1 year.

## 2024-07-25 NOTE — Telephone Encounter (Signed)
 Please request records from last visit with Dr. Andriette Renfroe. Thanks

## 2024-07-31 NOTE — Telephone Encounter (Signed)
 Received records from Dr Renfroe's office. Copy sent to medical records to be scanned. Copy placed in Sarah's office for review.

## 2024-08-01 ENCOUNTER — Telehealth: Payer: Self-pay | Admitting: Neurology

## 2024-08-01 NOTE — Telephone Encounter (Signed)
 I reviewed notes from [name] June 06, 2024.  He saw Dr. Hartford significant improvement in mood, good understanding of cognitive behavioral techniques.

## 2024-08-03 NOTE — Progress Notes (Unsigned)
 "    [address]       [place] [address]             [phone]                                                   Anthony [facility] Medical Record #[id number] Date of Birth: [birth year]   Referring: Anthony Calamity, Anthony Juarez Primary Care: Anthony Reyes SAUNDERS, Anthony Juarez Primary Cardiologist: Anthony Parchment, Anthony Juarez   Chief Complaint:        Chief Complaint  Patient presents with   chest wall mass      New patient consult, MR chest 2/14, PET 1/30      History of Present Illness:    Anthony Juarez 79 y.o. male presents for follow-up of a chest wall mass.  The has been present for 3-4  years, and appears to be stable.  It does not cause him any pain.    It has been unchanged since his last appointment.         Past Medical History:  Diagnosis Date   Allergy     Arthritis     Asthma      childhood   AVM (arteriovenous malformation) brain 06/15/2013   Balanitis     Benign prostatic hypertrophy     Central sleep apnea     Cholecystitis with cholelithiasis     Chronic sinus bradycardia 07/09/2018   Convulsive disorder (HCC)     Esophageal reflux 03/28/2015   History of nuclear stress test      Myoview 11/16: EF 60%, normal perfusion, Low Risk   Hyperlipidemia     Prostatitis     Seizures (HCC)                 Past Surgical History:  Procedure Laterality Date   ELBOW SURGERY   11/11/2000    repair   FRACTURE SURGERY        LEFT ELBOW   TONSILLECTOMY                   Family History  Problem Relation Age of Onset   Heart disease Father          cabg 54's   Cancer Father          Prostate   Heart disease Maternal Grandfather          Late 30's   Dementia Mother              Tobacco Use History  Social History        Tobacco Use  Smoking Status Former   Packs/day: 1.00   Years: 10.00   Additional pack years: 0.00   Total pack years: 10.00   Types: Cigarettes   Quit date: 07/13/1977   Years since quitting: 45.4  Smokeless Tobacco  Never      Social History        Substance and Sexual Activity  Alcohol Use Yes   Alcohol/week: 2.0 standard drinks of alcohol   Types: 2 Cans of beer per week    Comment: 2 beers a day        Allergies       Allergies  Allergen Reactions   Penicillins Rash      Did it involve swelling of the face/tongue/throat, SOB,  or low BP? No Did it involve sudden or severe rash/hives, skin peeling, or any reaction on the inside of your mouth or nose? Yes Did you need to seek medical attention at a hospital or doctor's office? No When did it last happen?  Decades ago. If all above answers are NO, may proceed with cephalosporin use.                Current Outpatient Medications  Medication Sig Dispense Refill   albuterol  (PROVENTIL  HFA;VENTOLIN  HFA) 108 (90 Base) MCG/ACT inhaler Inhale 2 puffs into the lungs every 4 (four) hours as needed for wheezing or shortness of breath (cough, shortness of breath or wheezing.). 1 Inhaler 2   aspirin  EC 81 MG tablet Take 81 mg by mouth daily.       doxazosin  (CARDURA ) 2 MG tablet Take 1 tablet (2 mg total) by mouth daily. 90 tablet 2   ezetimibe  (ZETIA ) 10 MG tablet Take 1 tablet (10 mg total) by mouth daily. 90 tablet 3   finasteride  (PROSCAR ) 5 MG tablet Take 1 tablet (5 mg total) by mouth daily. 90 tablet 2   fluticasone  (FLONASE ) 50 MCG/ACT nasal spray USE 1 SPRAY IN EACH NOSTRIL TWICE A DAY. 16 g 6   LamoTRIgine  200 MG TB24 24 hour tablet Take 1 tablet (200 mg total) by mouth at bedtime. 90 tablet 4   Melatonin 1 MG/ML LIQD Take 2 mg by mouth.       montelukast  (SINGULAIR ) 10 MG tablet TAKE ONE TABLET AT BEDTIME. 90 tablet 3   Multiple Vitamins-Minerals (MULTIVITAMIN WITH MINERALS) tablet Take 1 tablet by mouth daily.       omeprazole  (PRILOSEC) 20 MG capsule Take 1 capsule (20 mg total) by mouth daily. 30 capsule 0   rosuvastatin  (CRESTOR ) 20 MG tablet Take 1 tablet (20 mg total) by mouth daily. 90 tablet 3   tamsulosin (FLOMAX) 0.4 MG CAPS  capsule Take 0.4 mg by mouth daily.          No current facility-administered medications for this visit.        Review of Systems  Constitutional: Negative.   Respiratory: Negative.    Cardiovascular: Negative.   Musculoskeletal: Negative.   Neurological: Negative.       PHYSICAL EXAMINATION: Vitals:   08/04/24 0948  BP: (!) 166/94  Pulse: (!) 54  Resp: 20  SpO2: 100%       Physical Exam Constitutional:      General: He is not in acute distress.    Appearance: Normal appearance. He is not ill-appearing.  HENT:     Head: Normocephalic and atraumatic.  Eyes:     Extraocular Movements: Extraocular movements intact.  Cardiovascular:     Rate and Rhythm: Normal rate.  Pulmonary:     Effort: Pulmonary effort is normal. No respiratory distress.  Chest:     Abdominal:     General: Abdomen is flat. There is no distension.  Musculoskeletal:        General: Normal range of motion.     Cervical back: Normal range of motion and neck supple.  Skin:    General: Skin is warm and dry.  Neurological:     General: No focal deficit present.     Mental Status: He is alert and oriented to person, place, and time.        Diagnostic Studies & Laboratory data:     Recent Radiology Findings:  07/19/2024  IMPRESSION: 1. Stable right lateral chest wall soft  tissue lesion measuring 4.0 x 1.4 cm ( back to CT 2020). 2. See comparison MRI 08/26/2022 for soft tissue characterization.        I have independently reviewed the above radiology studies  and reviewed the findings with the patient.    Recent Lab Findings: Recent Labs       Lab Results  Component Value Date    WBC 8.5 07/22/2022    HGB 13.7 07/22/2022    HCT 42.1 07/22/2022    PLT 284 07/22/2022    GLUCOSE 94 07/22/2022    CHOL 153 07/27/2022    TRIG 60.0 07/27/2022    HDL 74.40 07/27/2022    LDLCALC 67 07/27/2022    ALT 20 07/27/2022    AST 33 07/27/2022    NA 135 07/22/2022    K 4.1 07/22/2022    CL 104  07/22/2022    CREATININE 1.04 10/21/2022    BUN 19 07/22/2022    CO2 24 07/22/2022    TSH 2.680 07/16/2020    HGBA1C 5.6 07/11/2020               Assessment / Plan:   78yo male with right chest wall mass.  On imaging, it appears cystic and well circumscribed.  There does not appear to be any muscular or rib involvement.  It has been stable on serial exams.  We discussed the risks and benefits of an excisional biopsy.  He would like to continue to watch this for now.  I will follow up in 1 year with repeat imaging.       Kirby Cortese O Jahden Schara "

## 2024-08-04 ENCOUNTER — Ambulatory Visit
Attending: Thoracic Surgery (Cardiothoracic Vascular Surgery) | Admitting: Thoracic Surgery (Cardiothoracic Vascular Surgery)

## 2024-08-04 ENCOUNTER — Encounter: Payer: Self-pay | Admitting: Thoracic Surgery (Cardiothoracic Vascular Surgery)

## 2024-08-04 VITALS — BP 166/94 | HR 54 | Resp 20 | Ht 70.8 in | Wt 160.5 lb

## 2024-08-04 DIAGNOSIS — R222 Localized swelling, mass and lump, trunk: Secondary | ICD-10-CM | POA: Diagnosis not present

## 2024-08-09 ENCOUNTER — Other Ambulatory Visit: Payer: Self-pay | Admitting: Cardiology

## 2024-08-18 ENCOUNTER — Encounter: Payer: Self-pay | Admitting: Family Medicine

## 2024-08-18 DIAGNOSIS — K5904 Chronic idiopathic constipation: Secondary | ICD-10-CM | POA: Insufficient documentation

## 2024-08-23 ENCOUNTER — Ambulatory Visit: Admitting: Family Medicine

## 2024-08-23 DIAGNOSIS — E782 Mixed hyperlipidemia: Secondary | ICD-10-CM

## 2024-08-23 DIAGNOSIS — R12 Heartburn: Secondary | ICD-10-CM

## 2024-08-23 DIAGNOSIS — R0982 Postnasal drip: Secondary | ICD-10-CM

## 2024-08-23 DIAGNOSIS — R0600 Dyspnea, unspecified: Secondary | ICD-10-CM

## 2024-08-24 ENCOUNTER — Ambulatory Visit (HOSPITAL_BASED_OUTPATIENT_CLINIC_OR_DEPARTMENT_OTHER): Admitting: Cardiology

## 2025-07-25 ENCOUNTER — Ambulatory Visit: Admitting: Neurology
